# Patient Record
Sex: Male | Born: 1935
Health system: Southern US, Community
[De-identification: ages and names within clinical notes are randomized; demographics above are authoritative.]

## PROBLEM LIST (undated history)

## (undated) DIAGNOSIS — E785 Hyperlipidemia, unspecified: Secondary | ICD-10-CM

## (undated) DIAGNOSIS — Z5189 Encounter for other specified aftercare: Secondary | ICD-10-CM

## (undated) DIAGNOSIS — T7840XA Allergy, unspecified, initial encounter: Secondary | ICD-10-CM

## (undated) DIAGNOSIS — R972 Elevated prostate specific antigen [PSA]: Secondary | ICD-10-CM

## (undated) DIAGNOSIS — E291 Testicular hypofunction: Secondary | ICD-10-CM

## (undated) DIAGNOSIS — I10 Essential (primary) hypertension: Secondary | ICD-10-CM

## (undated) DIAGNOSIS — E237 Disorder of pituitary gland, unspecified: Secondary | ICD-10-CM

## (undated) DIAGNOSIS — D649 Anemia, unspecified: Secondary | ICD-10-CM

## (undated) DIAGNOSIS — M1990 Unspecified osteoarthritis, unspecified site: Secondary | ICD-10-CM

## (undated) HISTORY — DX: Encounter for other specified aftercare: Z51.89

## (undated) HISTORY — DX: Allergy, unspecified, initial encounter: T78.40XA

## (undated) HISTORY — PX: TONSILLECTOMY: SUR1361

## (undated) HISTORY — DX: Disorder of pituitary gland, unspecified: E23.7

## (undated) HISTORY — DX: Essential (primary) hypertension: I10

## (undated) HISTORY — PX: LUMBAR DISC SURGERY: SHX700

## (undated) HISTORY — DX: Anemia, unspecified: D64.9

## (undated) HISTORY — DX: Unspecified osteoarthritis, unspecified site: M19.90

## (undated) HISTORY — DX: Hyperlipidemia, unspecified: E78.5

## (undated) HISTORY — PX: KNEE ARTHROSCOPY: SUR90

## (undated) HISTORY — DX: Testicular hypofunction: E29.1

## (undated) HISTORY — DX: Elevated prostate specific antigen (PSA): R97.20

---

## 2001-12-04 HISTORY — PX: DOPPLER ECHOCARDIOGRAPHY: SHX263

## 2002-11-05 DIAGNOSIS — E237 Disorder of pituitary gland, unspecified: Secondary | ICD-10-CM

## 2002-11-05 HISTORY — DX: Disorder of pituitary gland, unspecified: E23.7

## 2003-06-15 ENCOUNTER — Emergency Department (HOSPITAL_COMMUNITY): Admission: EM | Admit: 2003-06-15 | Discharge: 2003-06-16 | Payer: Self-pay | Admitting: Emergency Medicine

## 2003-06-16 ENCOUNTER — Encounter: Payer: Self-pay | Admitting: Internal Medicine

## 2003-06-16 ENCOUNTER — Inpatient Hospital Stay (HOSPITAL_COMMUNITY): Admission: EM | Admit: 2003-06-16 | Discharge: 2003-06-19 | Payer: Self-pay | Admitting: Emergency Medicine

## 2003-06-17 ENCOUNTER — Encounter: Payer: Self-pay | Admitting: Cardiology

## 2003-06-18 ENCOUNTER — Encounter: Payer: Self-pay | Admitting: Endocrinology

## 2003-07-14 ENCOUNTER — Ambulatory Visit (HOSPITAL_COMMUNITY): Admission: RE | Admit: 2003-07-14 | Discharge: 2003-07-14 | Payer: Self-pay | Admitting: Cardiology

## 2003-11-06 HISTORY — PX: TRANSPHENOIDAL / TRANSNASAL HYPOPHYSECTOMY / RESECTION PITUITARY TUMOR: SUR1382

## 2003-12-24 ENCOUNTER — Ambulatory Visit (HOSPITAL_COMMUNITY): Admission: RE | Admit: 2003-12-24 | Discharge: 2003-12-24 | Payer: Self-pay | Admitting: Neurosurgery

## 2004-05-25 ENCOUNTER — Encounter: Admission: RE | Admit: 2004-05-25 | Discharge: 2004-05-25 | Payer: Self-pay | Admitting: Endocrinology

## 2004-07-03 ENCOUNTER — Encounter: Admission: RE | Admit: 2004-07-03 | Discharge: 2004-07-03 | Payer: Self-pay | Admitting: Neurosurgery

## 2004-07-19 ENCOUNTER — Encounter: Admission: RE | Admit: 2004-07-19 | Discharge: 2004-07-19 | Payer: Self-pay | Admitting: Endocrinology

## 2004-09-05 ENCOUNTER — Ambulatory Visit: Payer: Self-pay | Admitting: Endocrinology

## 2004-09-29 ENCOUNTER — Ambulatory Visit: Payer: Self-pay | Admitting: Endocrinology

## 2004-10-03 ENCOUNTER — Encounter (INDEPENDENT_AMBULATORY_CARE_PROVIDER_SITE_OTHER): Payer: Self-pay | Admitting: Specialist

## 2004-10-03 ENCOUNTER — Inpatient Hospital Stay (HOSPITAL_COMMUNITY): Admission: RE | Admit: 2004-10-03 | Discharge: 2004-10-07 | Payer: Self-pay | Admitting: Neurosurgery

## 2004-11-24 ENCOUNTER — Ambulatory Visit (HOSPITAL_COMMUNITY): Admission: RE | Admit: 2004-11-24 | Discharge: 2004-11-24 | Payer: Self-pay | Admitting: Neurosurgery

## 2004-11-28 ENCOUNTER — Ambulatory Visit: Payer: Self-pay | Admitting: Endocrinology

## 2004-12-20 ENCOUNTER — Encounter: Admission: RE | Admit: 2004-12-20 | Discharge: 2004-12-20 | Payer: Self-pay | Admitting: Neurosurgery

## 2005-01-05 ENCOUNTER — Inpatient Hospital Stay (HOSPITAL_COMMUNITY): Admission: RE | Admit: 2005-01-05 | Discharge: 2005-01-06 | Payer: Self-pay | Admitting: Neurosurgery

## 2005-02-15 ENCOUNTER — Encounter: Admission: RE | Admit: 2005-02-15 | Discharge: 2005-02-15 | Payer: Self-pay | Admitting: Neurosurgery

## 2005-03-07 ENCOUNTER — Ambulatory Visit: Payer: Self-pay | Admitting: Endocrinology

## 2005-03-13 ENCOUNTER — Encounter: Admission: RE | Admit: 2005-03-13 | Discharge: 2005-03-13 | Payer: Self-pay | Admitting: Neurosurgery

## 2005-03-27 ENCOUNTER — Encounter: Admission: RE | Admit: 2005-03-27 | Discharge: 2005-03-27 | Payer: Self-pay | Admitting: Neurosurgery

## 2005-04-13 ENCOUNTER — Ambulatory Visit: Payer: Self-pay | Admitting: Endocrinology

## 2005-04-17 ENCOUNTER — Ambulatory Visit: Payer: Self-pay | Admitting: Endocrinology

## 2005-07-03 ENCOUNTER — Ambulatory Visit: Payer: Self-pay | Admitting: Internal Medicine

## 2005-07-04 ENCOUNTER — Ambulatory Visit: Payer: Self-pay

## 2005-09-10 ENCOUNTER — Ambulatory Visit: Payer: Self-pay | Admitting: Endocrinology

## 2005-09-18 ENCOUNTER — Ambulatory Visit: Payer: Self-pay | Admitting: Endocrinology

## 2005-10-04 ENCOUNTER — Ambulatory Visit: Payer: Self-pay

## 2005-10-11 ENCOUNTER — Ambulatory Visit: Payer: Self-pay | Admitting: Endocrinology

## 2005-11-13 ENCOUNTER — Ambulatory Visit: Payer: Self-pay | Admitting: Endocrinology

## 2005-12-31 ENCOUNTER — Ambulatory Visit: Payer: Self-pay | Admitting: Endocrinology

## 2006-03-19 ENCOUNTER — Ambulatory Visit: Payer: Self-pay | Admitting: Endocrinology

## 2006-03-25 ENCOUNTER — Ambulatory Visit: Payer: Self-pay | Admitting: Endocrinology

## 2006-03-25 HISTORY — PX: ELECTROCARDIOGRAM: SHX264

## 2006-04-29 ENCOUNTER — Encounter: Admission: RE | Admit: 2006-04-29 | Discharge: 2006-04-29 | Payer: Self-pay | Admitting: Neurosurgery

## 2006-08-05 ENCOUNTER — Encounter: Admission: RE | Admit: 2006-08-05 | Discharge: 2006-08-05 | Payer: Self-pay | Admitting: Orthopedic Surgery

## 2006-08-05 ENCOUNTER — Ambulatory Visit: Payer: Self-pay | Admitting: Endocrinology

## 2006-10-23 ENCOUNTER — Ambulatory Visit: Payer: Self-pay | Admitting: Internal Medicine

## 2006-11-11 ENCOUNTER — Ambulatory Visit: Payer: Self-pay | Admitting: Endocrinology

## 2006-12-13 ENCOUNTER — Encounter: Admission: RE | Admit: 2006-12-13 | Discharge: 2006-12-13 | Payer: Self-pay | Admitting: Neurosurgery

## 2006-12-19 ENCOUNTER — Emergency Department (HOSPITAL_COMMUNITY): Admission: EM | Admit: 2006-12-19 | Discharge: 2006-12-19 | Payer: Self-pay | Admitting: Emergency Medicine

## 2006-12-24 ENCOUNTER — Ambulatory Visit: Payer: Self-pay | Admitting: Endocrinology

## 2006-12-24 LAB — CONVERTED CEMR LAB
BUN: 23 mg/dL (ref 6–23)
Basophils Absolute: 0 10*3/uL (ref 0.0–0.1)
CO2: 31 meq/L (ref 19–32)
Creatinine, Ser: 1 mg/dL (ref 0.4–1.5)
GFR calc Af Amer: 95 mL/min
HCT: 33.3 % — ABNORMAL LOW (ref 39.0–52.0)
Hemoglobin: 11.5 g/dL — ABNORMAL LOW (ref 13.0–17.0)
Iron: 45 ug/dL (ref 42–165)
Lymphocytes Relative: 23.5 % (ref 12.0–46.0)
MCHC: 34.4 g/dL (ref 30.0–36.0)
Monocytes Absolute: 0.7 10*3/uL (ref 0.2–0.7)
Monocytes Relative: 13.5 % — ABNORMAL HIGH (ref 3.0–11.0)
Neutro Abs: 3.3 10*3/uL (ref 1.4–7.7)
Neutrophils Relative %: 60.5 % (ref 43.0–77.0)
Potassium: 4 meq/L (ref 3.5–5.1)
RDW: 14.9 % — ABNORMAL HIGH (ref 11.5–14.6)
Sodium: 137 meq/L (ref 135–145)
Transferrin: 186.1 mg/dL — ABNORMAL LOW (ref >=212.0)
Vitamin B-12: 392 pg/mL (ref 211–911)

## 2007-01-07 ENCOUNTER — Ambulatory Visit: Payer: Self-pay | Admitting: Endocrinology

## 2007-01-14 ENCOUNTER — Ambulatory Visit: Payer: Self-pay

## 2007-05-14 ENCOUNTER — Encounter: Payer: Self-pay | Admitting: Endocrinology

## 2007-05-14 DIAGNOSIS — K5730 Diverticulosis of large intestine without perforation or abscess without bleeding: Secondary | ICD-10-CM | POA: Insufficient documentation

## 2007-05-14 DIAGNOSIS — I10 Essential (primary) hypertension: Secondary | ICD-10-CM | POA: Insufficient documentation

## 2007-05-14 DIAGNOSIS — E119 Type 2 diabetes mellitus without complications: Secondary | ICD-10-CM | POA: Insufficient documentation

## 2007-05-14 DIAGNOSIS — E1122 Type 2 diabetes mellitus with diabetic chronic kidney disease: Secondary | ICD-10-CM | POA: Insufficient documentation

## 2007-05-14 DIAGNOSIS — M1990 Unspecified osteoarthritis, unspecified site: Secondary | ICD-10-CM | POA: Insufficient documentation

## 2007-05-14 DIAGNOSIS — D649 Anemia, unspecified: Secondary | ICD-10-CM

## 2007-05-14 DIAGNOSIS — J309 Allergic rhinitis, unspecified: Secondary | ICD-10-CM | POA: Insufficient documentation

## 2007-08-12 ENCOUNTER — Ambulatory Visit: Payer: Self-pay | Admitting: Endocrinology

## 2007-11-19 ENCOUNTER — Encounter: Payer: Self-pay | Admitting: Internal Medicine

## 2007-12-29 ENCOUNTER — Encounter: Payer: Self-pay | Admitting: Endocrinology

## 2008-01-27 ENCOUNTER — Telehealth (INDEPENDENT_AMBULATORY_CARE_PROVIDER_SITE_OTHER): Payer: Self-pay | Admitting: *Deleted

## 2008-02-24 ENCOUNTER — Ambulatory Visit: Payer: Self-pay | Admitting: Endocrinology

## 2008-02-25 ENCOUNTER — Encounter: Payer: Self-pay | Admitting: Endocrinology

## 2008-02-25 LAB — CONVERTED CEMR LAB
ALT: 29 units/L (ref 0–53)
AST: 38 units/L — ABNORMAL HIGH (ref 0–37)
Albumin: 3.5 g/dL (ref 3.5–5.2)
Alkaline Phosphatase: 53 units/L (ref 39–117)
Basophils Absolute: 0 10*3/uL (ref 0.0–0.1)
Calcium, Total (PTH): 9.1 mg/dL (ref 8.4–10.5)
Cholesterol: 109 mg/dL (ref 0–200)
Eosinophils Absolute: 0.2 10*3/uL (ref 0.0–0.7)
Folate: 16.6 ng/mL
Glucose, Bld: 96 mg/dL (ref 70–99)
HDL: 38.2 mg/dL — ABNORMAL LOW (ref >39.0)
Iron: 106 ug/dL (ref 42–165)
LDL Cholesterol: 60 mg/dL (ref 0–99)
Lymphocytes Relative: 28.6 % (ref 12.0–46.0)
MCHC: 32.9 g/dL (ref 30.0–36.0)
MCV: 93.1 fL (ref 78.0–100.0)
Neutrophils Relative %: 59.1 % (ref 43.0–77.0)
Platelets: 253 10*3/uL (ref 150–400)
Potassium: 3.8 meq/L (ref 3.5–5.1)
RDW: 13.5 % (ref 11.5–14.6)
Sodium: 140 meq/L (ref 135–145)
TSH: 3.4 microintl units/mL (ref 0.35–5.50)
VLDL: 11 mg/dL (ref 0–40)
Vitamin B-12: 608 pg/mL (ref 211–911)

## 2008-02-27 ENCOUNTER — Telehealth (INDEPENDENT_AMBULATORY_CARE_PROVIDER_SITE_OTHER): Payer: Self-pay | Admitting: *Deleted

## 2008-03-01 ENCOUNTER — Ambulatory Visit: Payer: Self-pay | Admitting: Endocrinology

## 2008-03-02 LAB — CONVERTED CEMR LAB
BUN: 18 mg/dL (ref 6–23)
CO2: 31 meq/L (ref 19–32)
Chloride: 105 meq/L (ref 96–112)
Eosinophils Relative: 3.6 % (ref 0.0–5.0)
Glucose, Bld: 101 mg/dL — ABNORMAL HIGH (ref 70–99)
HCT: 41.9 % (ref 39.0–52.0)
Hemoglobin, Urine: NEGATIVE
Leukocytes, UA: NEGATIVE
Lymphocytes Relative: 36.5 % (ref 12.0–46.0)
Monocytes Absolute: 0.5 10*3/uL (ref 0.1–1.0)
Monocytes Relative: 7.1 % (ref 3.0–12.0)
Nitrite: NEGATIVE
Platelets: 228 10*3/uL (ref 150–400)
Potassium: 3.8 meq/L (ref 3.5–5.1)
RDW: 13.2 % (ref 11.5–14.6)
Sodium: 141 meq/L (ref 135–145)
Total Protein, Urine: NEGATIVE mg/dL
WBC: 7.5 10*3/uL (ref 4.5–10.5)
pH: 6.5 (ref 5.0–8.0)

## 2008-03-03 ENCOUNTER — Ambulatory Visit: Payer: Self-pay | Admitting: Endocrinology

## 2008-05-06 ENCOUNTER — Ambulatory Visit: Payer: Self-pay | Admitting: Endocrinology

## 2008-05-06 DIAGNOSIS — D352 Benign neoplasm of pituitary gland: Secondary | ICD-10-CM | POA: Insufficient documentation

## 2008-05-06 DIAGNOSIS — R55 Syncope and collapse: Secondary | ICD-10-CM

## 2008-05-06 DIAGNOSIS — E7800 Pure hypercholesterolemia, unspecified: Secondary | ICD-10-CM | POA: Insufficient documentation

## 2008-05-06 DIAGNOSIS — D353 Benign neoplasm of craniopharyngeal duct: Secondary | ICD-10-CM

## 2008-05-08 LAB — CONVERTED CEMR LAB
BUN: 24 mg/dL — ABNORMAL HIGH (ref 6–23)
Calcium: 9.1 mg/dL (ref 8.4–10.5)
Cholesterol: 112 mg/dL (ref 0–200)
Creatinine, Ser: 1.2 mg/dL (ref 0.4–1.5)
GFR calc non Af Amer: 63 mL/min
HDL: 41.5 mg/dL (ref >39.0)
LDL Cholesterol: 62 mg/dL (ref 0–99)
Total CHOL/HDL Ratio: 2.7
Triglycerides: 45 mg/dL (ref 0–149)
VLDL: 9 mg/dL (ref 0–40)

## 2008-05-25 ENCOUNTER — Encounter: Admission: RE | Admit: 2008-05-25 | Discharge: 2008-05-25 | Payer: Self-pay | Admitting: Endocrinology

## 2008-08-17 ENCOUNTER — Telehealth: Payer: Self-pay | Admitting: Endocrinology

## 2008-09-01 ENCOUNTER — Ambulatory Visit: Payer: Self-pay | Admitting: Endocrinology

## 2008-09-01 DIAGNOSIS — M216X9 Other acquired deformities of unspecified foot: Secondary | ICD-10-CM | POA: Insufficient documentation

## 2008-09-01 DIAGNOSIS — R972 Elevated prostate specific antigen [PSA]: Secondary | ICD-10-CM | POA: Insufficient documentation

## 2008-09-01 DIAGNOSIS — G619 Inflammatory polyneuropathy, unspecified: Secondary | ICD-10-CM | POA: Insufficient documentation

## 2008-09-01 DIAGNOSIS — M21371 Foot drop, right foot: Secondary | ICD-10-CM | POA: Insufficient documentation

## 2008-09-01 DIAGNOSIS — G622 Polyneuropathy due to other toxic agents: Secondary | ICD-10-CM

## 2008-09-01 DIAGNOSIS — E291 Testicular hypofunction: Secondary | ICD-10-CM

## 2008-09-01 LAB — CONVERTED CEMR LAB: Hgb A1c MFr Bld: 6.6 % — ABNORMAL HIGH (ref 4.6–6.0)

## 2008-11-15 ENCOUNTER — Telehealth: Payer: Self-pay | Admitting: Endocrinology

## 2008-11-22 ENCOUNTER — Telehealth (INDEPENDENT_AMBULATORY_CARE_PROVIDER_SITE_OTHER): Payer: Self-pay | Admitting: *Deleted

## 2008-11-22 ENCOUNTER — Ambulatory Visit: Payer: Self-pay | Admitting: Internal Medicine

## 2008-11-22 DIAGNOSIS — J019 Acute sinusitis, unspecified: Secondary | ICD-10-CM | POA: Insufficient documentation

## 2008-11-29 ENCOUNTER — Telehealth (INDEPENDENT_AMBULATORY_CARE_PROVIDER_SITE_OTHER): Payer: Self-pay | Admitting: *Deleted

## 2008-11-29 ENCOUNTER — Ambulatory Visit: Payer: Self-pay | Admitting: Endocrinology

## 2008-12-09 ENCOUNTER — Telehealth: Payer: Self-pay | Admitting: Endocrinology

## 2008-12-09 ENCOUNTER — Ambulatory Visit: Payer: Self-pay | Admitting: Endocrinology

## 2008-12-09 LAB — CONVERTED CEMR LAB
BUN: 20 mg/dL (ref 6–23)
Calcium: 9.4 mg/dL (ref 8.4–10.5)
Chloride: 106 meq/L (ref 96–112)
Creatinine, Ser: 1.2 mg/dL (ref 0.4–1.5)
GFR calc non Af Amer: 63 mL/min
Hgb A1c MFr Bld: 6.1 % — ABNORMAL HIGH (ref 4.6–6.0)

## 2008-12-24 ENCOUNTER — Encounter: Admission: RE | Admit: 2008-12-24 | Discharge: 2008-12-24 | Payer: Self-pay | Admitting: Endocrinology

## 2008-12-28 ENCOUNTER — Telehealth (INDEPENDENT_AMBULATORY_CARE_PROVIDER_SITE_OTHER): Payer: Self-pay | Admitting: *Deleted

## 2008-12-28 DIAGNOSIS — M5030 Other cervical disc degeneration, unspecified cervical region: Secondary | ICD-10-CM | POA: Insufficient documentation

## 2009-01-28 ENCOUNTER — Encounter: Payer: Self-pay | Admitting: Endocrinology

## 2009-03-02 ENCOUNTER — Ambulatory Visit: Payer: Self-pay | Admitting: Endocrinology

## 2009-05-11 ENCOUNTER — Encounter: Payer: Self-pay | Admitting: Endocrinology

## 2009-06-08 ENCOUNTER — Telehealth (INDEPENDENT_AMBULATORY_CARE_PROVIDER_SITE_OTHER): Payer: Self-pay | Admitting: *Deleted

## 2009-08-17 ENCOUNTER — Encounter: Payer: Self-pay | Admitting: Endocrinology

## 2009-08-22 ENCOUNTER — Telehealth (INDEPENDENT_AMBULATORY_CARE_PROVIDER_SITE_OTHER): Payer: Self-pay | Admitting: *Deleted

## 2009-08-24 ENCOUNTER — Telehealth: Payer: Self-pay | Admitting: Endocrinology

## 2009-08-31 ENCOUNTER — Encounter: Payer: Self-pay | Admitting: Endocrinology

## 2009-10-17 ENCOUNTER — Ambulatory Visit: Payer: Self-pay | Admitting: Endocrinology

## 2009-10-17 DIAGNOSIS — R42 Dizziness and giddiness: Secondary | ICD-10-CM

## 2009-10-17 DIAGNOSIS — I498 Other specified cardiac arrhythmias: Secondary | ICD-10-CM | POA: Insufficient documentation

## 2009-10-25 ENCOUNTER — Telehealth (INDEPENDENT_AMBULATORY_CARE_PROVIDER_SITE_OTHER): Payer: Self-pay | Admitting: *Deleted

## 2009-11-28 ENCOUNTER — Telehealth: Payer: Self-pay | Admitting: Endocrinology

## 2009-12-07 ENCOUNTER — Encounter: Payer: Self-pay | Admitting: Endocrinology

## 2010-01-02 ENCOUNTER — Telehealth: Payer: Self-pay | Admitting: Endocrinology

## 2010-02-15 ENCOUNTER — Encounter: Payer: Self-pay | Admitting: Endocrinology

## 2010-04-19 ENCOUNTER — Encounter: Payer: Self-pay | Admitting: Endocrinology

## 2010-04-26 ENCOUNTER — Encounter: Admission: RE | Admit: 2010-04-26 | Discharge: 2010-04-26 | Payer: Self-pay | Admitting: Neurosurgery

## 2010-05-31 ENCOUNTER — Encounter: Payer: Self-pay | Admitting: Endocrinology

## 2010-07-04 ENCOUNTER — Encounter: Payer: Self-pay | Admitting: Endocrinology

## 2010-07-05 ENCOUNTER — Telehealth: Payer: Self-pay | Admitting: Endocrinology

## 2010-07-06 ENCOUNTER — Telehealth (INDEPENDENT_AMBULATORY_CARE_PROVIDER_SITE_OTHER): Payer: Self-pay | Admitting: *Deleted

## 2010-07-07 ENCOUNTER — Encounter: Payer: Self-pay | Admitting: Endocrinology

## 2010-08-17 ENCOUNTER — Encounter: Payer: Self-pay | Admitting: Endocrinology

## 2010-08-21 ENCOUNTER — Telehealth: Payer: Self-pay | Admitting: Endocrinology

## 2010-08-23 ENCOUNTER — Ambulatory Visit: Payer: Self-pay | Admitting: Endocrinology

## 2010-08-23 LAB — CONVERTED CEMR LAB
AST: 26 units/L (ref 0–37)
Albumin: 3.5 g/dL (ref 3.5–5.2)
Alkaline Phosphatase: 46 units/L (ref 39–117)
Basophils Absolute: 0.1 10*3/uL (ref 0.0–0.1)
CO2: 30 meq/L (ref 19–32)
Calcium: 9.1 mg/dL (ref 8.4–10.5)
Chloride: 103 meq/L (ref 96–112)
HDL: 40.7 mg/dL (ref >39.00)
LDL Cholesterol: 67 mg/dL (ref 0–99)
Leukocytes, UA: NEGATIVE
Lymphocytes Relative: 36.7 % (ref 12.0–46.0)
Microalb Creat Ratio: 0.5 mg/g (ref 0.0–30.0)
Monocytes Relative: 10.8 % (ref 3.0–12.0)
Nitrite: NEGATIVE
Platelets: 155 10*3/uL (ref 150.0–400.0)
RDW: 14.7 % — ABNORMAL HIGH (ref 11.5–14.6)
Sodium: 139 meq/L (ref 135–145)
Specific Gravity, Urine: 1.015 (ref 1.000–1.030)
Total CHOL/HDL Ratio: 3
Total Protein: 6.4 g/dL (ref 6.0–8.3)
Triglycerides: 61 mg/dL (ref 0.0–149.0)
Urine Glucose: NEGATIVE mg/dL
Urobilinogen, UA: 0.2 (ref 0.0–1.0)
Vitamin B-12: 626 pg/mL (ref 211–911)

## 2010-08-24 ENCOUNTER — Telehealth: Payer: Self-pay | Admitting: Endocrinology

## 2010-09-20 ENCOUNTER — Encounter: Payer: Self-pay | Admitting: Endocrinology

## 2010-09-27 ENCOUNTER — Telehealth (INDEPENDENT_AMBULATORY_CARE_PROVIDER_SITE_OTHER): Payer: Self-pay | Admitting: *Deleted

## 2010-10-11 ENCOUNTER — Encounter: Payer: Self-pay | Admitting: Endocrinology

## 2010-10-13 ENCOUNTER — Encounter: Payer: Self-pay | Admitting: Endocrinology

## 2010-10-17 ENCOUNTER — Encounter: Payer: Self-pay | Admitting: Gastroenterology

## 2010-11-02 ENCOUNTER — Encounter (INDEPENDENT_AMBULATORY_CARE_PROVIDER_SITE_OTHER): Payer: Self-pay | Admitting: *Deleted

## 2010-11-05 HISTORY — PX: COLONOSCOPY: SHX174

## 2010-11-20 ENCOUNTER — Encounter: Payer: Self-pay | Admitting: Endocrinology

## 2010-11-20 ENCOUNTER — Telehealth: Payer: Self-pay | Admitting: Endocrinology

## 2010-11-26 ENCOUNTER — Encounter: Payer: Self-pay | Admitting: Neurosurgery

## 2010-11-26 ENCOUNTER — Encounter: Payer: Self-pay | Admitting: Internal Medicine

## 2010-11-29 ENCOUNTER — Encounter (INDEPENDENT_AMBULATORY_CARE_PROVIDER_SITE_OTHER): Payer: Self-pay | Admitting: *Deleted

## 2010-12-01 ENCOUNTER — Encounter (INDEPENDENT_AMBULATORY_CARE_PROVIDER_SITE_OTHER): Payer: Self-pay | Admitting: *Deleted

## 2010-12-01 ENCOUNTER — Ambulatory Visit
Admission: RE | Admit: 2010-12-01 | Discharge: 2010-12-01 | Payer: Self-pay | Source: Home / Self Care | Attending: Gastroenterology | Admitting: Gastroenterology

## 2010-12-03 LAB — CONVERTED CEMR LAB
ALT: 19 units/L (ref 0–53)
AST: 29 units/L (ref 0–37)
Albumin: 3.7 g/dL (ref 3.5–5.2)
Alkaline Phosphatase: 54 units/L (ref 39–117)
BUN: 22 mg/dL (ref 6–23)
Basophils Relative: 0.1 % (ref 0.0–3.0)
Basophils Relative: 0.5 % (ref 0.0–3.0)
Bilirubin Urine: NEGATIVE
Bilirubin, Direct: 0.2 mg/dL (ref 0.0–0.3)
CO2: 32 meq/L (ref 19–32)
Calcium: 9.2 mg/dL (ref 8.4–10.5)
Calcium: 9.3 mg/dL (ref 8.4–10.5)
Chloride: 105 meq/L (ref 96–112)
Creatinine, Ser: 1.2 mg/dL (ref 0.4–1.5)
Creatinine,U: 143.7 mg/dL
Eosinophils Relative: 2.7 % (ref 0.0–5.0)
Eosinophils Relative: 3.4 % (ref 0.0–5.0)
GFR calc non Af Amer: 76.39 mL/min (ref >60)
Glucose, Bld: 108 mg/dL — ABNORMAL HIGH (ref 70–99)
HCT: 37.7 % — ABNORMAL LOW (ref 39.0–52.0)
HDL: 45.2 mg/dL (ref >39.00)
Hemoglobin: 12.3 g/dL — ABNORMAL LOW (ref 13.0–17.0)
Iron: 73 ug/dL (ref 42–165)
Ketones, ur: NEGATIVE mg/dL
LDL Cholesterol: 68 mg/dL (ref 0–99)
Lymphocytes Relative: 30 % (ref 12.0–46.0)
Lymphs Abs: 2.2 10*3/uL (ref 0.7–4.0)
MCHC: 33.7 g/dL (ref 30.0–36.0)
MCV: 94.5 fL (ref 78.0–100.0)
Microalb Creat Ratio: 1.4 mg/g (ref 0.0–30.0)
Monocytes Absolute: 0.8 10*3/uL (ref 0.1–1.0)
Neutro Abs: 3.2 10*3/uL (ref 1.4–7.7)
Neutrophils Relative %: 56.5 % (ref 43.0–77.0)
Nitrite: NEGATIVE
Platelets: 166 10*3/uL (ref 150.0–400.0)
RBC: 3.88 M/uL — ABNORMAL LOW (ref 4.22–5.81)
Saturation Ratios: 24.8 % (ref 20.0–50.0)
Sodium: 140 meq/L (ref 135–145)
Sodium: 142 meq/L (ref 135–145)
Total CHOL/HDL Ratio: 3
Total Protein, Urine: NEGATIVE mg/dL
Total Protein: 7.1 g/dL (ref 6.0–8.3)
Triglycerides: 56 mg/dL (ref 0.0–149.0)
Urine Glucose: NEGATIVE mg/dL
WBC: 5.7 10*3/uL (ref 4.5–10.5)
WBC: 6.5 10*3/uL (ref 4.5–10.5)
pH: 7 (ref 5.0–8.0)

## 2010-12-05 NOTE — Letter (Signed)
Summary: [facility]   Imported By: Sherian Rein 02/21/2010 09:04:23  _____________________________________________________________________  External Attachment:    Type:   Image     Comment:   External Document

## 2010-12-05 NOTE — Progress Notes (Signed)
  Phone Note Refill Request Message from:  Fax from Pharmacy on January 02, 2010 9:12 AM  Refills Requested: Medication #1:  FLOMAX 0.4 MG  CP24 take 1 by mouth qd Initial call taken by: Josph Macho RMA,  January 02, 2010 9:13 AM    Prescriptions: FLOMAX 0.4 MG  CP24 (TAMSULOSIN HCL) take 1 by mouth qd  #90 x 1   Entered by:   Josph Macho RMA   Authorized by:   Minus Breeding MD   Signed by:   Josph Macho RMA on 01/02/2010   Method used:   Faxed to ...       [facility]       [address] [place], [place]  [address]       Ph: [phone]       Fax: [phone]   RxID:   [id number]

## 2010-12-05 NOTE — Letter (Signed)
Summary: [facility]  [facility]   Imported By: Sherian Rein 05/01/2010 10:56:19  _____________________________________________________________________  External Attachment:    Type:   Image     Comment:   External Document

## 2010-12-05 NOTE — Assessment & Plan Note (Signed)
Summary: FU PER TRIAGE/NWS #   Vital Signs:  Patient profile:   76 year old male Height:      75 inches (190.50 cm) Weight:      230 pounds (104.55 kg) BMI:     28.85 O2 Sat:      97 % on Room air Temp:     97.3 degrees F (36.28 degrees C) oral Pulse rate:   52 / minute BP sitting:   92 / 60  (left arm) Cuff size:   large  Vitals Entered By: Brenton Grills MA (August 23, 2010 11:32 AM)  O2 Flow:  Room air CC: F/U for diabetic shoes/Rx for Robaxin/pt is no longer taking Garlic/aj Is Patient Diabetic? Yes   CC:  F/U for diabetic shoes/Rx for Robaxin/pt is no longer taking Garlic/aj.  History of Present Illness: pt states few mos of slight pain at the right foot, and assoc swelling.    Current Medications (verified): 1)  B-6 Folic Acid 2103958972-50 Mcg-Mcg-Mg  Caps (Cobalamine Combinations) .... Take 1 By Mouth Qd 2)  Actos 45 Mg  Tabs (Pioglitazone Hcl) .... Take 1 By Mouth Qd 3)  Allegra 180 Mg  Tabs (Fexofenadine Hcl) .... Take 1 By Mouth Qd 4)  Flomax 0.4 Mg  Cp24 (Tamsulosin Hcl) .... Take 1 By Mouth Qd 5)  Glucophage Xr 500 Mg  Tb24 (Metformin Hcl) .... Take 2 By Mouth Qd 6)  Mobic 15 Mg  Tabs (Meloxicam) .... Take 1 By Mouth Qd 7)  Robaxin 500 Mg  Tabs (Methocarbamol) .... Take 1 By Mouth Qhs 8)  Saw Palmetto 450 Mg  Caps (Saw Palmetto (Serenoa Repens)) .... Take 1 By Mouth Qd 9)  Garlic 400 Mg  Tbec (Garlic) .... Take 1 By Mouth Qd 10)  Methocarbamol 500 Mg  Tabs (Methocarbamol) .... Take 1 By Mouth Once Daily Prn 11)  Ultram 50 Mg  Tabs (Tramadol Hcl) .... Take 1 By Mouth Q6h As Needed Pain 12)  Fludrocortisone Acetate 0.1 Mg  Tabs (Fludrocortisone Acetate) .... Qd 13)  Vytorin 10-80 Mg  Tabs (Ezetimibe-Simvastatin) .... Qhs 14)  Finasteride 5 Mg Tabs (Finasteride) .Marland Kitchen.. 1 Tab Once Daily  Allergies (verified): No Known Drug Allergies  Past History:  Past Medical History: Last updated: 03/03/2008 Allergic rhinitis Anemia-NOS Diabetes mellitus, type  II Diverticulosis, colon Hypertension Osteoarthritis Hemorrhoids Dyslipidemia (2001) Pituitary Macroadenoma (2004) Elevated PSA, Bx Neg Hypotension/ Syncope, w/u Neg  Review of Systems       The patient complains of weight gain.  The patient denies chest pain and dyspnea on exertion.    Physical Exam  General:  normal appearance.   Pulses:  dorsalis pedis intact bilat.   Extremities:  no deformity.  no ulcer on the feet.  feet are of normal color and temp.   1+ right pedal edema, trace left pedal edema, and mycotic toenails.   there is a healed surgical scar on the right foot.   Neurologic:  sensation is intact to touch on the feet. Additional Exam:  Hemoglobin A1C       [H]  7.3 %       Impression & Recommendations:  Problem # 1:  DIABETES MELLITUS, TYPE II (ICD-250.00) needs increased rx  Problem # 2:  hypotension and bradycardia uncertain etiology  Problem # 3:  foot pain uncertain etiology  Medications Added to Medication List This Visit: 1)  Glucophage Xr 500 Mg Tb24 (Metformin hcl) .... Take 4 by mouth once daily 2)  Diabetic Shoes  .Marland KitchenMarland KitchenMarland Kitchen  1 pair  Other Orders: TLB-TSH (Thyroid Stimulating Hormone) (84443-TSH) TLB-Hepatic/Liver Function Pnl (80076-HEPATIC) TLB-CBC Platelet - w/Differential (85025-CBCD) TLB-BMP (Basic Metabolic Panel-BMET) (80048-METABOL) TLB-Lipid Panel (80061-LIPID) TLB-IBC Pnl (Iron/FE;Transferrin) (83550-IBC) TLB-B12, Serum-Total ONLY (82607-B12) TLB-A1C / Hgb A1C (Glycohemoglobin) (83036-A1C) TLB-Udip w/ Micro (81001-URINE) TLB-Microalbumin/Creat Ratio, Urine (82043-MALB) Cardiology Referral (Cardiology) Est. Patient Level IV ([address])  Patient Instructions: 1)  blood tests are being ordered for you today.  please call [phone] to hear your test results. 2)  here is a prescription for diabetic shoes.   3)  please schedule a "medicare wellness" appointment soon. 4)  (update: i left message on phone-tree:  increase metformin to 4x500 mg  once daily.  refer cardiol). Prescriptions: ROBAXIN 500 MG  TABS (METHOCARBAMOL) take 1 by mouth qhs  #30 x 11   Entered and Authorized by:   Minus Breeding MD   Signed by:   Minus Breeding MD on 08/24/2010   Method used:   Electronically to        [facility]  [address] [facility]* (retail)       [address]       [place], [place]  [address]       Ph: [phone] or [phone]       Fax: [phone]   RxID:   [id number] GLUCOPHAGE XR 500 MG  TB24 (METFORMIN HCL) take 4 by mouth once daily  #120 x 11   Entered and Authorized by:   Minus Breeding MD   Signed by:   Minus Breeding MD on 08/23/2010   Method used:   Electronically to        [facility]  [address] [facility]* (retail)       [address]       [place], [place]  [address]       Ph: [phone] or [phone]       Fax: [phone]   RxID:   [id number] DIABETIC SHOES 1 pair  #1 pr x 0   Entered and Authorized by:   Minus Breeding MD   Signed by:   Minus Breeding MD on 08/23/2010   Method used:   Print then Give to Patient   RxID:   [id number]    Orders Added: 1)  TLB-TSH (Thyroid Stimulating Hormone) [84443-TSH] 2)  TLB-Hepatic/Liver Function Pnl [80076-HEPATIC] 3)  TLB-CBC Platelet - w/Differential [85025-CBCD] 4)  TLB-BMP (Basic Metabolic Panel-BMET) [80048-METABOL] 5)  TLB-Lipid Panel [80061-LIPID] 6)  TLB-IBC Pnl (Iron/FE;Transferrin) [83550-IBC] 7)  TLB-B12, Serum-Total ONLY [82607-B12] 8)  TLB-A1C / Hgb A1C (Glycohemoglobin) [83036-A1C] 9)  TLB-Udip w/ Micro [81001-URINE] 10)  TLB-Microalbumin/Creat Ratio, Urine [82043-MALB] 11)  Cardiology Referral [Cardiology] 12)  Est. Patient Level IV [69485]

## 2010-12-05 NOTE — Letter (Signed)
Summary: [facility]  [facility]   Imported By: Sherian Rein 12/21/2009 10:40:54  _____________________________________________________________________  External Attachment:    Type:   Image     Comment:   External Document

## 2010-12-05 NOTE — Progress Notes (Signed)
Summary: Actos  Phone Note Call from Patient Call back at Home Phone [phone]   Caller: Patient Summary of Call: Pt called stating that he has seen several commercial on TV linking Actos to Bladder Cancer. Pt wants to know if MD think he is okay to continue taking medication. Initial call taken by: Margaret Pyle, CMA,  July 05, 2010 11:23 AM  Follow-up for Phone Call        if i was you, i would continue this medication.   Follow-up by: Minus Breeding MD,  July 05, 2010 1:19 PM  Additional Follow-up for Phone Call Additional follow up Details #1::        Pt informed FYI: Pt is going to have foot surgery at [facility] 09.02.2011 Additional Follow-up by: Margaret Pyle, CMA,  July 05, 2010 1:33 PM

## 2010-12-05 NOTE — Letter (Signed)
Summary: [facility]  [facility]   Imported By: Sherian Rein 09/25/2010 07:34:28  _____________________________________________________________________  External Attachment:    Type:   Image     Comment:   External Document

## 2010-12-05 NOTE — Progress Notes (Signed)
Summary: ALT med  Phone Note Call from Patient Call back at Home Phone [phone]   Caller: Patient Summary of Call: Pt called stating his Insurance will not cover Allegra because it is OTC but the OTC cost it too expensive. Pt is requesting an alternative med that Insurance may cover. Initial call taken by: Margaret Pyle, CMA,  September 27, 2010 4:28 PM  Follow-up for Phone Call        ok to try generic xyzal - done per emr Follow-up by: [name] MD,  September 27, 2010 5:23 PM  Additional Follow-up for Phone Call Additional follow up Details #1::        Pt notified of MD instructions and that med sent. KJ LPN Additional Follow-up by: Kathlene November LPN,  September 29, 2010 8:35 AM    New/Updated Medications: LEVOCETIRIZINE DIHYDROCHLORIDE 5 MG TABS (LEVOCETIRIZINE DIHYDROCHLORIDE) 1po once daily as needed allergies Prescriptions: LEVOCETIRIZINE DIHYDROCHLORIDE 5 MG TABS (LEVOCETIRIZINE DIHYDROCHLORIDE) 1po once daily as needed allergies  #30 x 5   Entered by:   [name] MD   Authorized by:   Minus Breeding MD   Signed by:   [name] MD on 09/27/2010   Method used:   Electronically to        [facility]  [address] [facility]* (retail)       [address]       [place], [place]  [address]       Ph: [phone] or [phone]       Fax: [phone]   RxID:   [id number]

## 2010-12-05 NOTE — Progress Notes (Signed)
Summary: rx refill req  Phone Note Refill Request Message from:  Fax from Pharmacy on August 24, 2010 11:16 AM  Refills Requested: Medication #1:  FLUDROCORTISONE ACETATE 0.1 MG  TABS qd   Dosage confirmed as above?Dosage Confirmed  Method Requested: Fax to Mail Away Pharmacy Initial call taken by: Brenton Grills MA,  August 24, 2010 11:17 AM    Prescriptions: FLUDROCORTISONE ACETATE 0.1 MG  TABS (FLUDROCORTISONE ACETATE) qd  #90 x 3   Entered by:   Brenton Grills MA   Authorized by:   Minus Breeding MD   Signed by:   Brenton Grills MA on 08/24/2010   Method used:   Faxed to ...       [facility] ([facility]order[facility]       [address] [place], [place]  [address]       Ph: [phone]       Fax: [phone]   RxID:   [id number]

## 2010-12-05 NOTE — Progress Notes (Signed)
Summary: Rx req  Phone Note Call from Patient Call back at Home Phone [phone]   Caller: Patient Summary of Call: Pt called requesting Rx for DM shoes. Initial call taken by: Margaret Pyle, CMA,  August 21, 2010 10:49 AM  Follow-up for Phone Call        ov is due.  we'll address then. Follow-up by: Minus Breeding MD,  August 21, 2010 12:23 PM  Additional Follow-up for Phone Call Additional follow up Details #1::        Pt advised to make OV via home VM. Additional Follow-up by: Margaret Pyle, CMA,  August 21, 2010 1:12 PM

## 2010-12-05 NOTE — Letter (Signed)
Summary: [facility]  [facility]   Imported By: Lennie Odor 06/08/2010 10:34:48  _____________________________________________________________________  External Attachment:    Type:   Image     Comment:   External Document

## 2010-12-05 NOTE — Progress Notes (Signed)
  Phone Note Refill Request Message from:  Fax from Pharmacy on January 02, 2010 9:38 AM  Refills Requested: Medication #1:  VYTORIN 10-80 MG  TABS qhs   Dosage confirmed as above?Dosage Confirmed  Medication #2:  ACTOS 45 MG  TABS take 1 by mouth qd   Dosage confirmed as above?Dosage Confirmed  Medication #3:  ALLEGRA 180 MG  TABS take 1 by mouth qd   Dosage confirmed as above?Dosage Confirmed  Medication #4:  GLUCOPHAGE XR 500 MG  TB24 take 2 by mouth qd   Dosage confirmed as above?Dosage Confirmed Initial call taken by: Josph Macho RMA,  January 02, 2010 9:39 AM    Prescriptions: VYTORIN 10-80 MG  TABS (EZETIMIBE-SIMVASTATIN) qhs  #90 x 2   Entered by:   Josph Macho RMA   Authorized by:   Minus Breeding MD   Signed by:   Josph Macho RMA on 01/02/2010   Method used:   Faxed to ...       [facility] (mail-order)       [address] [place], [place]  [address]       Ph: [phone]       Fax: [phone]   RxID:   [id number] ALLEGRA 180 MG  TABS (FEXOFENADINE HCL) take 1 by mouth qd  #90 x 2   Entered by:   Josph Macho RMA   Authorized by:   Minus Breeding MD   Signed by:   Josph Macho RMA on 01/02/2010   Method used:   Faxed to ...       [facility] (mail[facility]order)       [address] [place], [place]  [address]       Ph: [phone]       Fax: [phone]   RxID:   [id number] GLUCOPHAGE XR 500 MG  TB24 (METFORMIN HCL) take 2 by mouth qd  #180 x 2   Entered by:   Josph Macho RMA   Authorized by:   Minus Breeding MD   Signed by:   Josph Macho RMA on 01/02/2010   Method used:   Faxed to ...       [facility] (mail[facility]order)       [address] [place], [place]  [address]       Ph: [phone]       Fax: [phone]   RxID:   [id number] ACTOS 45 MG  TABS (PIOGLITAZONE HCL) take 1 by mouth qd  #90 x 2   Entered by:   Josph Macho RMA   Authorized by:   Minus Breeding MD   Signed by:   Josph Macho RMA on  01/02/2010   Method used:   Faxed to ...       [facility] (mail[facility]order)       [address] [place], [place]  [address]       Ph: [phone]       Fax: [phone]   RxID:   [id number]

## 2010-12-05 NOTE — Progress Notes (Signed)
  Phone Note Refill Request Message from:  Fax from Pharmacy on November 28, 2009 10:24 AM  Refills Requested: Medication #1:  FLUDROCORTISONE ACETATE 0.1 MG  TABS qd   Dosage confirmed as above?Dosage Confirmed Initial call taken by: Josph Macho CMA,  November 28, 2009 10:24 AM    Prescriptions: FLUDROCORTISONE ACETATE 0.1 MG  TABS (FLUDROCORTISONE ACETATE) qd  #90 x 2   Entered by:   Josph Macho CMA   Authorized by:   Minus Breeding MD   Signed by:   Josph Macho CMA on 11/28/2009   Method used:   Faxed to ...       [facility] (mail-order)       [address] [place], [place]  [address]       Ph: [phone]       Fax: [phone]   RxID:   [id number]

## 2010-12-05 NOTE — Letter (Signed)
Summary: [facility]  [facility]   Imported By: Lester Pittsburgh 07/17/2010 09:05:46  _____________________________________________________________________  External Attachment:    Type:   Image     Comment:   External Document

## 2010-12-05 NOTE — Letter (Signed)
Summary: [facility]  [facility]   Imported By: Sherian Rein 07/11/2010 13:24:25  _____________________________________________________________________  External Attachment:    Type:   Image     Comment:   External Document

## 2010-12-05 NOTE — Progress Notes (Signed)
   Records faxed to [facility] w/ [facility] [phone] [facility]  July 06, 2010 10:00 AM

## 2010-12-05 NOTE — Letter (Signed)
Summary: [facility]  [facility]   Imported By: Sherian Rein 08/26/2010 10:35:38  _____________________________________________________________________  External Attachment:    Type:   Image     Comment:   External Document

## 2010-12-07 NOTE — Letter (Signed)
Summary: [facility] Instructions  [facility]  [address] [place], [place] [address]   Phone: [phone]  Fax: [phone]       Joseph Rocha    01-May-1935    MRN: [id number]        Procedure Day /Date: Friday 12/15/10     Arrival Time: 8:30 am      Procedure Time: 9:30 am     Location of Procedure:                    _X_  [facility] (4th Floor)      PREPARATION FOR COLONOSCOPY WITH MOVIPREP   Starting 5 days prior to your procedure _Sunday 2/5/12_ do not eat nuts, seeds, popcorn, corn, beans, peas,  salads, or any raw vegetables.  Do not take any fiber supplements (e.g. Metamucil, Citrucel, and Benefiber).  THE DAY BEFORE YOUR PROCEDURE         DATE: 12/14/10  DAY: Thursday    1.  Drink clear liquids the entire day-NO SOLID FOOD  2.  Do not drink anything colored red or purple.  Avoid juices with pulp.  No orange juice.  3.  Drink at least 64 oz. (8 glasses) of fluid/clear liquids during the day to prevent dehydration and help the prep work efficiently.  CLEAR LIQUIDS INCLUDE: Water Jello Ice Popsicles Tea (sugar ok, no milk/cream) Powdered fruit flavored drinks Coffee (sugar ok, no milk/cream) Gatorade Juice: apple, white grape, white cranberry  Lemonade Clear bullion, consomm, broth Carbonated beverages (any kind) Strained chicken noodle soup Hard Candy                             4.  In the morning, mix first dose of MoviPrep solution:    Empty 1 Pouch A and 1 Pouch B into the disposable container    Add lukewarm drinking water to the top line of the container. Mix to dissolve    Refrigerate (mixed solution should be used within 24 hrs)  5.  Begin drinking the prep at 5:00 p.m. The MoviPrep container is divided by 4 marks.   Every 15 minutes drink the solution down to the next mark (approximately 8 oz) until the full liter is complete.   6.  Follow completed prep with 16 oz of clear liquid of your choice (Nothing red or  purple).  Continue to drink clear liquids until bedtime.  7.  Before going to bed, mix second dose of MoviPrep solution:    Empty 1 Pouch A and 1 Pouch B into the disposable container    Add lukewarm drinking water to the top line of the container. Mix to dissolve    Refrigerate  THE DAY OF YOUR PROCEDURE      DATE: 12/15/10   DAY: Friday   Beginning at 4:30 a.m. (5 hours before procedure):         1. Every 15 minutes, drink the solution down to the next mark (approx 8 oz) until the full liter is complete.  2. Follow completed prep with 16 oz. of clear liquid of your choice.    3. You may drink clear liquids until 7:30 am  (2 HOURS BEFORE PROCEDURE).   MEDICATION INSTRUCTIONS  Unless otherwise instructed, you should take regular prescription medications with a small sip of water   as early as possible the morning of your procedure.  Diabetic patients - see separate instructions.  Additional medication instructions:  Hold Iron pill for 7 days.         OTHER INSTRUCTIONS  You will need a responsible adult at least 74 years of age to accompany you and drive you home.   This person must remain in the waiting room during your procedure.  Wear loose fitting clothing that is easily removed.  Leave jewelry and other valuables at home.  However, you may wish to bring a book to read or  an iPod/MP3 player to listen to music as you wait for your procedure to start.  Remove all body piercing jewelry and leave at home.  Total time from sign-in until discharge is approximately 2-3 hours.  You should go home directly after your procedure and rest.  You can resume normal activities the  day after your procedure.  The day of your procedure you should not:   Drive   Make legal decisions   Operate machinery   Drink alcohol   Return to work  You will receive specific instructions about eating, activities and medications before you leave.    The above instructions  have been reviewed and explained to me by   Sherren Kerns RN  December 01, 2010 10:48 AM     I fully understand and can verbalize these instructions _____________________________ Date _________

## 2010-12-07 NOTE — Progress Notes (Signed)
Summary: vytorin  Phone Note Refill Request Message from:  Fax from Pharmacy on November 20, 2010 1:24 PM  Refills Requested: Medication #1:  VYTORIN 10-80 MG  TABS qhs [facility]   Method Requested: Fax to [facility] Initial call taken by: Orlan Leavens RMA,  November 20, 2010 1:25 PM    Prescriptions: VYTORIN 10-80 MG  TABS (EZETIMIBE-SIMVASTATIN) qhs  #90 x 2   Entered by:   Orlan Leavens RMA   Authorized by:   Minus Breeding MD   Signed by:   Orlan Leavens RMA on 11/20/2010   Method used:   Faxed to ...       [facility] (mail-order)       [address] [place], [place]  [address]       Ph: [phone]       Fax: [phone]   RxID:   [id number]

## 2010-12-07 NOTE — Letter (Signed)
Summary: Verdis Prime MD/[facility]  Verdis Prime MD/[facility]   Imported By: Lester Kirtland 11/21/2010 10:01:24  _____________________________________________________________________  External Attachment:    Type:   Image     Comment:   External Document

## 2010-12-07 NOTE — Consult Note (Signed)
Summary: [facility]   Imported By: Sherian Rein 10/17/2010 10:45:28  _____________________________________________________________________  External Attachment:    Type:   Image     Comment:   External Document

## 2010-12-07 NOTE — Letter (Signed)
Summary: Diabetic Instructions  [facility]  [address] [place], [place] [address]   Phone: [phone]  Fax: [phone]    Joseph Rocha May 23, 1935 MRN: [id number]   _ x _   ORAL DIABETIC MEDICATION INSTRUCTIONS(glucophage & actos)  The day before your procedure:   Take your diabetic pill as you do normally  The day of your procedure:   Do not take your diabetic pill    We will check your blood sugar levels during the admission process and again in Recovery before discharging you home  ________________________________________________________________________

## 2010-12-07 NOTE — Letter (Signed)
Summary: Colonoscopy Letter  [facility]  [address]   [place], [place] [address]   Phone: [phone]  Fax: [phone]      October 17, 2010 MRN: [id number]   Joseph Rocha [address] [place], [place]  [address]   Dear [name],   According to your medical record, it is time for you to schedule a Colonoscopy. The American Cancer Society recommends this procedure as a method to detect early colon cancer. Patients with a family history of colon cancer, or a personal history of colon polyps or inflammatory bowel disease are at increased risk.  This letter has been generated based on the recommendations made at the time of your procedure. If you feel that in your particular situation this may no longer apply, please contact our office.  Please call our office at [phone] to schedule this appointment or to update your records at your earliest convenience.  Thank you for cooperating with [place] to provide you with the very best care possible.   Sincerely,  Claudette Head, M.D.  [facility] [phone]

## 2010-12-07 NOTE — Miscellaneous (Signed)
Summary: previsit prep/rm  Clinical Lists Changes  Medications: Added new medication of MOVIPREP 100 GM  SOLR (PEG-KCL-NACL-NASULF-NA ASC-C) As per prep instructions. - Signed Rx of MOVIPREP 100 GM  SOLR (PEG-KCL-NACL-NASULF-NA ASC-C) As per prep instructions.;  #1 x 0;  Signed;  Entered by: Sherren Kerns RN;  Authorized by: Meryl Dare MD [facility];  Method used: Electronically to [facility]. # [id number]*, [address] [place], [place], [place]  [address], Ph: [phone] or [phone], Fax: [phone] Observations: Added new observation of ALLERGY REV: Done (12/01/2010 10:26) Added new observation of NKA: T (12/01/2010 10:26)    Prescriptions: MOVIPREP 100 GM  SOLR (PEG-KCL-NACL-NASULF-NA ASC-C) As per prep instructions.  #1 x 0   Entered by:   Sherren Kerns RN   Authorized by:   Meryl Dare MD [facility]   Signed by:   Sherren Kerns RN on 12/01/2010   Method used:   Electronically to        [facility] (retail)       [address]       [place], [place]  [address]       Ph: [phone] or [phone]       Fax: [phone]   RxID:   [id number]

## 2010-12-07 NOTE — Letter (Signed)
Summary: [facility]   Imported By: Sherian Rein 11/29/2010 11:13:00  _____________________________________________________________________  External Attachment:    Type:   Image     Comment:   External Document

## 2010-12-07 NOTE — Letter (Signed)
Summary: Pre Visit Letter Revised  [facility]  [address] [place], [place] [address]   Phone: [phone]  Fax: [phone]        11/02/2010 MRN: [id number] Joseph Rocha [address] [place], [place]  [address]             Procedure Date:  12-15-10   Welcome to the Gastroenterology Division at [facility].    You are scheduled to see a nurse for your pre-procedure visit on 12-01-10 at 10:30a.m. on the 3rd floor at [facility], [address]  We ask that you try to arrive at our office 15 minutes prior to your appointment time to allow for check-in.  Please take a minute to review the attached form.  If you answer "Yes" to one or more of the questions on the first page, we ask that you call the person listed at your earliest opportunity.  If you answer "No" to all of the questions, please complete the rest of the form and bring it to your appointment.    Your nurse visit will consist of discussing your medical and surgical history, your immediate family medical history, and your medications.   If you are unable to list all of your medications on the form, please bring the medication bottles to your appointment and we will list them.  We will need to be aware of both prescribed and over the counter drugs.  We will need to know exact dosage information as well.    Please be prepared to read and sign documents such as consent forms, a financial agreement, and acknowledgement forms.  If necessary, and with your consent, a friend or relative is welcome to sit-in on the nurse visit with you.  Please bring your insurance card so that we may make a copy of it.  If your insurance requires a referral to see a specialist, please bring your referral form from your primary care physician.  No co-pay is required for this nurse visit.     If you cannot keep your appointment, please call [phone] to cancel or reschedule prior to your appointment date.  This  allows [place] the opportunity to schedule an appointment for another patient in need of care.    Thank you for choosing [facility] for your medical needs.  We appreciate the opportunity to care for you.  Please visit [place] at our website  to learn more about our practice.  Sincerely, The Gastroenterology Division

## 2010-12-13 ENCOUNTER — Encounter: Payer: Self-pay | Admitting: Endocrinology

## 2010-12-15 ENCOUNTER — Other Ambulatory Visit: Payer: Self-pay | Admitting: Gastroenterology

## 2010-12-15 ENCOUNTER — Other Ambulatory Visit (AMBULATORY_SURGERY_CENTER): Payer: Medicare Other | Admitting: Gastroenterology

## 2010-12-15 DIAGNOSIS — Z1211 Encounter for screening for malignant neoplasm of colon: Secondary | ICD-10-CM

## 2010-12-15 DIAGNOSIS — K5730 Diverticulosis of large intestine without perforation or abscess without bleeding: Secondary | ICD-10-CM

## 2010-12-15 DIAGNOSIS — K648 Other hemorrhoids: Secondary | ICD-10-CM

## 2010-12-15 LAB — HM COLONOSCOPY

## 2010-12-21 NOTE — Procedures (Signed)
Summary: Colonosocpy   Colonoscopy  Procedure date:  12/15/2010  Findings:      Location:  [facility].    Procedures Next Due Date:    Colonoscopy: 12/2020 COLONOSCOPY PROCEDURE REPORT PATIENT:  Joseph Rocha, Joseph Rocha  MR#:  [id number] BIRTHDATE:   25-Dec-1934, 75 yrs. old   GENDER:   male ENDOSCOPIST:   Judie Petit T. Russella Dar, MD, [facility]   PROCEDURE DATE:  12/15/2010 PROCEDURE:  Average-risk screening colonoscopy P2951 ASA CLASS:   Class II INDICATIONS: 1) Routine Risk Screening  MEDICATIONS:    Fentanyl 75 mcg IV, Versed 8 mg IV DESCRIPTION OF PROCEDURE:   After the risks benefits and alternatives of the procedure were thoroughly explained, informed consent was obtained.  Digital rectal exam was performed and revealed no abnormalities.   The LB [id number]-[id number] endoscope was introduced through the anus and advanced to the cecum, which was identified by both the appendix and ileocecal valve, without limitations.  The quality of the prep was good, using MoviPrep.  The instrument was then slowly withdrawn as the colon was fully examined. <<PROCEDUREIMAGES>>          <<OLD IMAGES>> FINDINGS:  Mild diverticulosis was found in the sigmoid colon. A normal appearing cecum, ileocecal valve, and appendiceal orifice were identified. The ascending, hepatic flexure, transverse, splenic flexure, descending colon, and rectum appeared unremarkable. Retroflexed views in the rectum revealed internal hemorrhoids, small. The time to cecum =  3  minutes. The scope was then withdrawn (time =  10  min) from the patient and the procedure completed.  COMPLICATIONS:   None  ENDOSCOPIC IMPRESSION:  1) Mild diverticulosis in the sigmoid colon  2) Internal hemorrhoids  RECOMMENDATIONS:  1) High fiber diet with liberal fluid intake.  2) Continue current colorectal screening for "routine risk" patients with a repeat colonoscopy in 10 years.    Venita Lick. Russella Dar, MD, [facility]   CC: Minus Breeding,  MD

## 2010-12-27 ENCOUNTER — Telehealth: Payer: Self-pay | Admitting: Endocrinology

## 2010-12-27 NOTE — Consult Note (Signed)
[facility]   Imported By: Lennie Odor 12/21/2010 09:42:26  _____________________________________________________________________  External Attachment:    Type:   Image     Comment:   External Document

## 2011-01-02 NOTE — Progress Notes (Signed)
Summary: actos  Phone Note Refill Request Message from:  Fax from Pharmacy on December 27, 2010 10:35 AM  Refills Requested: Medication #1:  ACTOS 45 MG  TABS take 1 by mouth qd   Dosage confirmed as above?Dosage Confirmed   Last Refilled: 01/02/2010 [facility]-90 day supply   Method Requested: Fax to Mail Away Pharmacy Next Appointment Scheduled: none Initial call taken by: Brenton Grills CMA Duncan Dull),  December 27, 2010 10:36 AM    Prescriptions: ACTOS 45 MG  TABS (PIOGLITAZONE HCL) take 1 by mouth qd  #90 x 0   Entered by:   Brenton Grills CMA (AAMA)   Authorized by:   Minus Breeding MD   Signed by:   Brenton Grills CMA (AAMA) on 12/27/2010   Method used:   Faxed to ...       [facility]order[facility]       [address] [place], [place]  [address]       Ph: [phone]       Fax: [phone]   RxID:   [id number]

## 2011-02-06 ENCOUNTER — Other Ambulatory Visit: Payer: Self-pay | Admitting: Endocrinology

## 2011-02-06 DIAGNOSIS — E119 Type 2 diabetes mellitus without complications: Secondary | ICD-10-CM

## 2011-02-06 MED ORDER — METFORMIN HCL ER 500 MG PO TB24: ORAL_TABLET | ORAL | Status: DC

## 2011-02-06 NOTE — Telephone Encounter (Signed)
R'cd fax from [facility] for refill of pt's Metformin.  Last OV-08/23/2010 Last Filled-08/23/2010

## 2011-02-06 NOTE — Telephone Encounter (Signed)
Rx faxed to [facility].

## 2011-02-06 NOTE — Telephone Encounter (Signed)
Addended by: Brenton Grills on: 02/06/2011 10:02 AM   Modules accepted: Orders

## 2011-02-06 NOTE — Telephone Encounter (Signed)
Rx printed to be signed and faxed to [facility] (rx did not go through electronically).

## 2011-02-23 ENCOUNTER — Other Ambulatory Visit (INDEPENDENT_AMBULATORY_CARE_PROVIDER_SITE_OTHER): Payer: Medicare Other

## 2011-02-23 ENCOUNTER — Encounter: Payer: Self-pay | Admitting: Internal Medicine

## 2011-02-23 ENCOUNTER — Ambulatory Visit (INDEPENDENT_AMBULATORY_CARE_PROVIDER_SITE_OTHER): Payer: Medicare Other | Admitting: Internal Medicine

## 2011-02-23 DIAGNOSIS — E7800 Pure hypercholesterolemia, unspecified: Secondary | ICD-10-CM

## 2011-02-23 DIAGNOSIS — E119 Type 2 diabetes mellitus without complications: Secondary | ICD-10-CM

## 2011-02-23 DIAGNOSIS — I10 Essential (primary) hypertension: Secondary | ICD-10-CM

## 2011-02-23 DIAGNOSIS — E237 Disorder of pituitary gland, unspecified: Secondary | ICD-10-CM | POA: Insufficient documentation

## 2011-02-23 DIAGNOSIS — D352 Benign neoplasm of pituitary gland: Secondary | ICD-10-CM

## 2011-02-23 DIAGNOSIS — L049 Acute lymphadenitis, unspecified: Secondary | ICD-10-CM | POA: Insufficient documentation

## 2011-02-23 LAB — CBC WITH DIFFERENTIAL/PLATELET
Basophils Absolute: 0 10*3/uL (ref 0.0–0.1)
HCT: 36.7 % — ABNORMAL LOW (ref 39.0–52.0)
Lymphs Abs: 2.2 10*3/uL (ref 0.7–4.0)
MCHC: 33.9 g/dL (ref 30.0–36.0)
MCV: 92.9 fl (ref 78.0–100.0)
Monocytes Absolute: 0.8 10*3/uL (ref 0.1–1.0)
Neutro Abs: 3.1 10*3/uL (ref 1.4–7.7)
Platelets: 176 10*3/uL (ref 150.0–400.0)
RDW: 14.5 % (ref 11.5–14.6)

## 2011-02-23 LAB — TSH: TSH: 3.22 u[IU]/mL (ref 0.35–5.50)

## 2011-02-23 LAB — LIPID PANEL
Cholesterol: 106 mg/dL (ref 0–200)
HDL: 39.4 mg/dL (ref >39.00)
Triglycerides: 71 mg/dL (ref 0.0–149.0)

## 2011-02-23 LAB — COMPREHENSIVE METABOLIC PANEL
ALT: 20 U/L (ref 0–53)
Alkaline Phosphatase: 42 U/L (ref 39–117)
Creatinine, Ser: 1.3 mg/dL (ref 0.4–1.5)
Sodium: 139 mEq/L (ref 135–145)
Total Bilirubin: 0.4 mg/dL (ref 0.3–1.2)
Total Protein: 6.1 g/dL (ref 6.0–8.3)

## 2011-02-23 LAB — HEMOGLOBIN A1C: Hgb A1c MFr Bld: 6.3 % (ref 4.6–6.5)

## 2011-02-23 MED ORDER — EZETIMIBE-SIMVASTATIN 10-80 MG PO TABS: 1.0000 | ORAL_TABLET | Freq: Every day | ORAL | Status: DC

## 2011-02-23 MED ORDER — PIOGLITAZONE HCL 45 MG PO TABS: 45.0000 mg | ORAL_TABLET | Freq: Every day | ORAL | Status: DC

## 2011-02-23 MED ORDER — FLUDROCORTISONE ACETATE 0.1 MG PO TABS: 0.1000 mg | ORAL_TABLET | Freq: Every day | ORAL | Status: DC

## 2011-02-23 MED ORDER — CEFUROXIME AXETIL 500 MG PO TABS: 500.0000 mg | ORAL_TABLET | Freq: Two times a day (BID) | ORAL | Status: AC

## 2011-02-23 NOTE — Assessment & Plan Note (Signed)
Check his lytes today

## 2011-02-23 NOTE — Assessment & Plan Note (Addendum)
Start ceftin for bacterial infection, will check a CBC as well

## 2011-02-23 NOTE — Patient Instructions (Signed)
Diabetes, Type 2 Diabetes is a lasting (chronic) disease. In type 2 diabetes, the pancreas does not make enough insulin (a hormone), and the body does not respond normally to the insulin that is made. This type of diabetes was also previously called adult onset diabetes. About 90% of all those who have diabetes have type 2. It usually occurs after the age of 34 but can occur at any age. CAUSES Unlike type 1 diabetes, which happens because insulin is no longer being made, type 2 diabetes happens because the body is making less insulin and has trouble using the insulin properly. SYMPTOMS  Drinking more than usual.   Urinating more than usual.   Blurred vision.   Dry, itchy skin.   Frequent infection like yeast infections in women.   More tired than usual (fatigue).  TREATMENT  Healthy eating.   Exercise.   Medication, if needed.   Monitoring blood glucose (sugar).   Seeing your caregiver regularly.  HOME CARE INSTRUCTIONS  Check your blood glucose (sugar) at least once daily. More frequent monitoring may be necessary, depending on your medications and on how well your diabetes is controlled. Your caregiver will advise you.   Take your medicine as directed by your caregiver.   Do not smoke.   Make wise food choices. Ask your caregiver for information. Weight loss can improve your diabetes.   Learn about low blood glucose (hypoglycemia) and how to treat it.   Get your eyes checked regularly.   Have a yearly physical exam. Have your blood pressure checked. Get your blood and urine tested.   Wear a pendant or bracelet saying that you have diabetes.   Check your feet every night for sores. Let your caregiver know if you have sores that are not healing.  SEEK MEDICAL CARE IF:  You are having problems keeping your blood glucose at target range.   You feel you might be having problems with your medicines.   You have symptoms of an illness that is not improving after 24  hours.   You have a sore or wound that is not healing.   You notice a change in vision or a new problem with your vision.  You develop a fever of more than 100.5Lymph Nodes, Swollen Glands The lymphatic system filters fluid from around cells. It is like a system of blood vessels. These channels carry lymph instead of blood. The lymphatic system is an important part of the immune (disease fighting) system. When people talk about "swollen glands in the neck," they are usually talking about swollen lymph nodes. The lymph nodes are like the little traps for infection. You and your caregiver may be able to feel lymph nodes, especially swollen nodes, in these common areas: the groin (inguinal area), armpits (axilla), and above the clavicle (supraclavicular). You may also feel them in the neck (cervical) and the back of the head just above the hairline (occipital). Swollen glands occur when there is any condition in which the body responds with an allergic type of reaction. For instance, the glands in the neck can become swollen from insect bites or any type of minor infection on the head. These are very noticeable in children with only minor problems. Lymph nodes may also become swollen when there is a tumor or problem with the lymphatic system, such as Hodgkin's disease. TREATMENT Most swollen glands do not require treatment. They can be observed (watched) for a short period of time, if your caregiver feels it is necessary. Most  of the time, observation is not necessary.  Antibiotics (medicines that kill germs) may be prescribed by your caregiver. Your caregiver may prescribe these if he or she feels the swollen glands are due to a bacterial (germ) infection. Antibiotics are not used if the swollen glands are caused by a virus.  HOME CARE INSTRUCTIONS Take medications as directed by your caregiver. Only take over-the-counter or prescription medicines for pain, discomfort, or fever as directed by your  caregiver.  SEEK MEDICAL CARE IF: If you begin to run a temperature greater than 100.5, or as your caregiver suggests.  MAKE SURE YOU:  Understand these instructions.  Will watch your condition.  Will get help right away if you are not doing well or get worse.  Document Released: 10/12/2002 Document Re-Released: 10/04/2008  [facility] Patient Information 2011 [place], [place]..  Document Released: 10/22/2005 Document Re-Released: 11/13/2009 [facility] Patient Information 2011 [place], [place].

## 2011-02-23 NOTE — Assessment & Plan Note (Signed)
Check his A1C level today and monitor his renal function

## 2011-02-23 NOTE — Progress Notes (Signed)
Subjective:    Patient ID: Joseph Rocha, male    DOB: 05-26-1935, 76 y.o.   MRN: [id number]  URI  This is a new problem. The current episode started yesterday. The problem has been unchanged. There has been no fever. Associated symptoms include ear pain (left ear), rhinorrhea, sinus pain and swollen glands. Pertinent negatives include no abdominal pain, chest pain, congestion, coughing, diarrhea, dysuria, headaches, joint pain, joint swelling, nausea, neck pain, plugged ear sensation, rash, sneezing, sore throat, vomiting or wheezing. He has tried nothing for the symptoms.  Diabetes He presents for his follow-up diabetic visit. He has type 2 diabetes mellitus. No MedicAlert identification noted. His disease course has been fluctuating. Pertinent negatives for hypoglycemia include no confusion, dizziness, headaches, hunger, mood changes, nervousness/anxiousness, pallor, seizures, sleepiness, speech difficulty, sweats or tremors. Pertinent negatives for diabetes include no blurred vision, no chest pain, no fatigue, no foot paresthesias, no foot ulcerations, no polydipsia, no polyphagia, no polyuria, no visual change, no weakness and no weight loss. There are no hypoglycemic complications. Symptoms are improving. There are no diabetic complications. He is compliant with treatment all of the time. His weight is stable. He is following a generally healthy diet. Meal planning includes avoidance of concentrated sweets. He has not had a previous visit with a dietician. He never participates in exercise. There is no change in his home blood glucose trend. His breakfast blood glucose range is generally 110-130 mg/dl. His dinner blood glucose range is generally 110-130 mg/dl. His overall blood glucose range is 110-130 mg/dl. An ACE inhibitor/angiotensin II receptor blocker is not being taken. He does not see a podiatrist.Eye exam is not current.      Review of Systems  Constitutional: Negative for fever,  chills, weight loss, diaphoresis, activity change, appetite change, fatigue and unexpected weight change.  HENT: Positive for ear pain (left ear) and rhinorrhea. Negative for hearing loss, nosebleeds, congestion, sore throat, facial swelling, sneezing, drooling, mouth sores, trouble swallowing, neck pain, neck stiffness, dental problem, voice change, postnasal drip, sinus pressure, tinnitus and ear discharge.   Eyes: Negative for blurred vision, photophobia, pain, discharge, redness, itching and visual disturbance.  Respiratory: Negative for apnea, cough, choking, chest tightness, shortness of breath, wheezing and stridor.   Cardiovascular: Negative for chest pain, palpitations and leg swelling.  Gastrointestinal: Negative for nausea, vomiting, abdominal pain, diarrhea and blood in stool.  Genitourinary: Negative for dysuria and polyuria.  Musculoskeletal: Negative for myalgias, back pain, joint pain, joint swelling, arthralgias and gait problem.  Skin: Negative for color change, pallor and rash.  Neurological: Negative for dizziness, tremors, seizures, speech difficulty, weakness and headaches.  Hematological: Positive for adenopathy. Negative for polydipsia and polyphagia. Does not bruise/bleed easily.  Psychiatric/Behavioral: Negative for confusion. The patient is not nervous/anxious.    Lab Results  Component Value Date   WBC 6.4 08/23/2010   HGB 12.4* 08/23/2010   HCT 36.1* 08/23/2010   PLT 155.0 08/23/2010   CHOL 120 08/23/2010   TRIG 61.0 08/23/2010   HDL 40.70 08/23/2010   ALT 15 08/23/2010   AST 26 08/23/2010   NA 139 08/23/2010   K 3.7 08/23/2010   CL 103 08/23/2010   CREATININE 1.4 08/23/2010   BUN 27* 08/23/2010   CO2 30 08/23/2010   TSH 3.85 08/23/2010   PSA 0.67 03/02/2009   HGBA1C 7.3* 08/23/2010   MICROALBUR 0.7 08/23/2010      Objective:   Physical Exam  Constitutional: He is oriented to person, place, and time. He  appears well-developed and well-nourished. No  distress.  HENT:  Head: Normocephalic. No trismus in the jaw.  Right Ear: Hearing, tympanic membrane, external ear and ear canal normal. No drainage, swelling or tenderness. No middle ear effusion. No decreased hearing is noted.  Left Ear: Hearing, tympanic membrane, external ear and ear canal normal. No drainage, swelling or tenderness.  No middle ear effusion. No decreased hearing is noted.  Nose: Nose normal. No mucosal edema, rhinorrhea, sinus tenderness or septal deviation.  No foreign bodies. Right sinus exhibits no maxillary sinus tenderness and no frontal sinus tenderness. Left sinus exhibits no maxillary sinus tenderness and no frontal sinus tenderness.  Mouth/Throat: Uvula is midline, oropharynx is clear and moist and mucous membranes are normal. Mucous membranes are not pale, not dry and not cyanotic. He does not have dentures. No oral lesions. Normal dentition. No dental abscesses, uvula swelling, lacerations or dental caries. No oropharyngeal exudate, posterior oropharyngeal edema, posterior oropharyngeal erythema or tonsillar abscesses.  Eyes: Conjunctivae and EOM are normal. Pupils are equal, round, and reactive to light. Right eye exhibits no discharge. Left eye exhibits no discharge. No scleral icterus.  Neck: Normal range of motion. Neck supple. No JVD present. No tracheal deviation present. No thyromegaly present.  Cardiovascular: Normal rate, regular rhythm, normal heart sounds and intact distal pulses.  Exam reveals no gallop and no friction rub.   No murmur heard. Pulmonary/Chest: Effort normal and breath sounds normal. No respiratory distress. He has no wheezes. He has no rales. He exhibits no tenderness.  Abdominal: Soft. Bowel sounds are normal. He exhibits no distension and no mass. There is no tenderness. There is no rebound and no guarding.  Musculoskeletal: Normal range of motion. He exhibits no edema and no tenderness.  Lymphadenopathy:       Head (right side): No  submental, no submandibular, no tonsillar, no preauricular, no posterior auricular and no occipital adenopathy present.       Head (left side): No submental, no submandibular, no tonsillar, no preauricular, no posterior auricular and no occipital adenopathy present.    He has cervical adenopathy.       Right cervical: No superficial cervical, no deep cervical and no posterior cervical adenopathy present.      Left cervical: Deep cervical adenopathy present. No superficial cervical and no posterior cervical adenopathy present.    He has no axillary adenopathy.       Right: No inguinal, no supraclavicular and no epitrochlear adenopathy present.       Left: No inguinal, no supraclavicular and no epitrochlear adenopathy present.  Neurological: He is alert and oriented to person, place, and time. He has normal reflexes. No cranial nerve deficit. He exhibits normal muscle tone. Coordination normal.  Skin: Skin is warm and dry. No rash noted. He is not diaphoretic. No erythema. No pallor.  Psychiatric: He has a normal mood and affect. His behavior is normal. Judgment and thought content normal.          Assessment & Plan:

## 2011-02-23 NOTE — Assessment & Plan Note (Signed)
His BP is well controlled 

## 2011-03-09 ENCOUNTER — Encounter: Payer: Self-pay | Admitting: Endocrinology

## 2011-03-09 ENCOUNTER — Ambulatory Visit (INDEPENDENT_AMBULATORY_CARE_PROVIDER_SITE_OTHER): Payer: Medicare Other | Admitting: Endocrinology

## 2011-03-09 VITALS — BP 106/68 | HR 67 | Temp 98.1°F | Ht 75.5 in | Wt 229.0 lb

## 2011-03-09 DIAGNOSIS — D649 Anemia, unspecified: Secondary | ICD-10-CM

## 2011-03-09 DIAGNOSIS — Z0000 Encounter for general adult medical examination without abnormal findings: Secondary | ICD-10-CM

## 2011-03-09 NOTE — Patient Instructions (Signed)
please consider these measures for your health:  minimize alcohol.  do not use tobacco products.  have a colonoscopy at least every 10 years from age 76.  keep firearms safely stored.  always use seat belts.  have working smoke alarms in your home.  see an eye doctor and dentist regularly.  never drive under the influence of alcohol or drugs (including prescription drugs).   please let me know what your wishes would be, if artificial life support measures should become necessary.  it is critically important to prevent falling down (keep floor areas well-lit, dry, and free of loose objects) good diet and exercise habits significanly improve the control of your diabetes.  please let me know if you wish to be referred to a dietician.  high blood sugar is very risky to your health.  you should see an eye doctor every year. controlling your blood pressure and cholesterol drastically reduces the damage diabetes does to your body.  this also applies to quitting smoking.  please discuss these with your doctor.  you should take an aspirin every day, unless you have been advised by a doctor not to. Please make a follow-up appointment in 6 months.  please let me know if you want a prescription for a shingles vaccine.

## 2011-03-09 NOTE — Progress Notes (Signed)
Subjective:    Patient ID: Joseph Rocha, male    DOB: 03-Dec-1934, 76 y.o.   MRN: [id number]  HPI Subjective:   Patient here for Medicare annual wellness visit and management of other chronic and acute problems.      Risk factors: advanced age    Roster of Physicians Providing Medical Care to Patient: Urol: tannenbaum Cardiol: smith Allergy: eugene Rexford Neurosurg: roy Ortho: pt does not recall.    Activities of Daily Living: In your present state of health, do you have any difficulty performing the following activities?:  Preparing food and eating?: No  Bathing yourself: No  Getting dressed: No  Using the toilet:No  Moving around from place to place: No  In the past year have you fallen or had a near fall?:No    Home Safety: Has smoke detector and wears seat belts. No firearms.   Diet and Exercise  Current exercise habits:  Walks frequently, when knee and foot pain permits Dietary issues discussed: pt reports healthy diet   Depression Screen  Q1: Over the past two weeks, have you felt down, depressed or hopeless?no  Q2: Over the past two weeks, have you felt little interest or pleasure in doing things? no   The following portions of the patient's history were reviewed and updated as appropriate: allergies, current medications, past family history, past medical history, past social history, past surgical history and problem list.   Review of Systems  Denies hearing loss, and visual loss Objective:   Vision:  Sees opthalmologist Hearing: grossly normal Body mass index: see vs page Msk: pt easily and quickly performs "get-up-and-go" from a sitting position Cognitive Impairment Assessment: cognition, memory and judgment appear normal.  remembers 3/3 at 5 minutes.  excellent recall.  can easily read and write a sentence.  alert and oriented x 3    Assessment:   Medicare wellness utd on preventive parameters    Plan:   During the course of the visit the patient  was educated and counseled about appropriate screening and preventive services including:        Fall prevention Diabetes screening  Nutrition counseling   Vaccines / LABS Zostavax / Pnemonccoal Vaccine  today  PSA--sees urol  Patient Instructions (the written plan) was given to the patient.      Past Medical History  Diagnosis Date  . Diabetes mellitus   . Hyperlipidemia   . Anemia   . Hypertension   . Allergy   . Pituitary abnormality 2004  . OA (osteoarthritis)   . Elevated PSA   . Hypogonadism male     Past Surgical History  Procedure Date  . Spine surgery   . Joint replacement     History   Social History  . Marital Status: Married    Spouse Name: N/A    Number of Children: N/A  . Years of Education: N/A   Occupational History  . retired    Social History Main Topics  . Smoking status: Former Smoker    Quit date: 02/22/1957  . Smokeless tobacco: Not on file  . Alcohol Use: No  . Drug Use: No  . Sexually Active: Not Currently   Other Topics Concern  . Not on file   Social History Narrative  . No narrative on file    Current Outpatient Prescriptions on File Prior to Visit  Medication Sig Dispense Refill  . ezetimibe-simvastatin (VYTORIN) 10-80 MG per tablet Take 1 tablet by mouth at bedtime.  90 tablet  3  . fludrocortisone (FLORINEF) 0.1 MG tablet Take 1 tablet (0.1 mg total) by mouth daily.  90 tablet  3  . metFORMIN (GLUCOPHAGE XR) 500 MG 24 hr tablet Take 4 tablets by mouth once daily  360 tablet  1  . pioglitazone (ACTOS) 45 MG tablet Take 1 tablet (45 mg total) by mouth daily.  90 tablet  3    No Known Allergies  No family history on file.  BP 106/68  Pulse 67  Temp(Src) 98.1 F (36.7 C) (Oral)  Ht 6' 3.5" (1.918 m)  Wt 229 lb (103.874 kg)  BMI 28.25 kg/m2  SpO2 95%     Review of Systems     Objective:   Physical Exam        Assessment & Plan:  Wellness visit today, with problems stable, except as noted.

## 2011-03-23 NOTE — Discharge Summary (Signed)
NAMEKINGSTEN, ENFIELD NO.:  [id number]   MEDICAL RECORD NO.:  [id number]          PATIENT TYPE:  INP   LOCATION:                               FACILITY:  [facility]   PHYSICIAN:  Reinaldo Meeker, M.D. DATE OF BIRTH:  [birth year]   DATE OF ADMISSION:  10/03/2004  DATE OF DISCHARGE:  10/07/2004                                 DISCHARGE SUMMARY   PRIMARY DIAGNOSIS:  Pituitary adenoma.   PRIMARY OPERATIVE PROCEDURE:  Transsphenoidal approach for resection of  pituitary tumor, placement of lumbar drain.   HISTORY:  Mr. Hartt is a 76 year old gentleman with known pituitary  adenoma.  He has been followed with serial scans that have shown progressive  enlargement with some worsening of his vision, he is now admitted for  transsphenoidal approach for tumor removal.  On October 03, 2004 the  patient was taken to the operating room where he underwent the  transsphenoidal approach for resection of his pituitary tumor.  During the  time of his surgery there was some transudation of spinal fluid across the  diaphragma sellae, it was elected to put in a lumbar drain when the case was  completed.  This was done without difficulty.  The patient woke up from  doing well.  He never developed diabetes insipidus and his lumbar drain  functioned well.  After approximately three days, the drain was removed.  Dr. Ezzard Standing of ENT removed his nasal packing and the patient was able to  increase his activities without difficulty.  By October 07, 2004 the patient  was up, ambulating well, and tolerating a regular diet.  There was no  evidence of diabetes insipidus, his electrolytes were all normal.  It was  felt that he could be discharged home on pain medication and a steroid  replacement.  His condition was improved versus admission.       ___________________________________________  Reinaldo Meeker, M.D.    ROK/MEDQ  D:  03/29/2005  T:  03/29/2005  Job:  [id number]

## 2011-03-23 NOTE — Op Note (Signed)
   NAME:  Joseph Rocha, Joseph Rocha                         ACCOUNT NO.:  [id number]   MEDICAL RECORD NO.:  [id number]                   PATIENT TYPE:  OIB   LOCATION:  2857                                 FACILITY:  [facility]   PHYSICIAN:  Armanda Magic, M.D.                  DATE OF BIRTH:  07-Sep-1935   DATE OF PROCEDURE:  07/14/2003  DATE OF DISCHARGE:  07/14/2003                                 OPERATIVE REPORT   REFERRING PHYSICIAN:  Colleen Can. Deborah Chalk, M.D.   PROCEDURE PERFORMED:  Tilt table test.   INDICATIONS FOR PROCEDURE:  Syncope.   OPERATOR:  Armanda Magic, M.D.   COMPLICATIONS:  None.   This is a 76 year old male with a history of syncope and recent  hospitalization where evaluation thus far with work-up was negative.  He now  presents for tilt table test.   DESCRIPTION OF PROCEDURE:  The patient was brought to the cardiac  catheterization laboratory in the fasting, nonsedated state.  Informed  consent was obtained.  The patient was connected to continuous heart rate  and pulse oximetry monitoring, intermittent blood pressure monitoring.  The  patient's blood pressure was measured for five minutes supine.  The blood  pressure ranged from 118/79 to 133/80 mmHg with heart rates in the 50s.  The  patient was then tilted upright to 70 degrees for a total of 30 minutes.  The lowest blood pressure achieved was 109/79 mmHg with heart rates in the  60s.  The patient was then tilted back supine and Isuprel was started at 7mL  per hour which is 0.5 mcg.  This was titrated up to a total of 2 mcg to  increase the heart rate by 20%.  The patient was then tilted back up to 70  degrees for a total of 15 minutes.  The lowest blood pressure noted was  102/51 mmHg.  Heart rates were in the 70s to 90s.  The patient was  completely asymptomatic during the entire procedure.  At the end of the  procedure, the patient was tilted back supine.  All monitoring equipment was  removed and the patient was  discharged to home in stable condition.   ASSESSMENT AND PLAN:  1. Syncope.  2. Negative tilt table test for syncope.  3. Further work-up with Dr. Deborah Chalk.                                               Armanda Magic, M.D.    TT/MEDQ  D:  07/15/2003  T:  07/16/2003  Job:  [id number]   cc:   Colleen Can. Deborah Chalk, M.D.  Fax: [phone]

## 2011-03-23 NOTE — Consult Note (Signed)
[facility]                          ENDOCRINOLOGY CONSULTATION   NAME:Joseph Rocha, Joseph Rocha                      MRN:          [id number]  DATE:01/07/2007                            DOB:          Jun 09, 1935    PREOPERATIVE DIAGNOSES:   REFERRING PHYSICIAN:  Donalee Citrin, M.D.   HISTORY OF PRESENT ILLNESS:  The patient has a history of unexplained  syncope and hypotension, but he has not had any more episodes since  February 14 of this year.   He has a history of mild anemia with a negative workup, and his iron  level was slightly low recently.  He has had colonoscopy, which did not  reveal a cause.   Regarding his diabetes, he states it is well-controlled.   PAST MEDICAL HISTORY:  1. Elevated PSA, for which a biopsy was recently negative by Dr.      Patsi Sears.  2. Central hypogonadism.  He had to discontinue his testosterone gel,      due to elevated PSA.  3. Allergic rhinitis.  4. Pituitary adenoma, nonfunctioning.  5. Dyslipidemia.  6. Hemorrhoids.  7. Diverticulosis.  8. Osteoarthritis.   MEDICATIONS:  1. Celebrex 200 mg a day.  2. Robaxin 500 mg q.h.s.  3. Vytorin 10/80 one q.h.s.  4. Glucophage-XR 500 mg daily.  5. Florinef 100 micrograms daily.  6. Flomax 0.4 mg daily.  7. Allegra 180 mg daily.  8. Actos 45 mg daily.  9. Percocet 5/325 one as needed for pain.   REVIEW OF SYSTEMS:  Denies chest pain, shortness of breath and  hypoglycemia.   PHYSICAL EXAM:  Blood pressure 93/54, heart rate 59, temperature is  98.9, the weight is 223.  GENERAL:  No distress.  SKIN:  Not diaphoretic, no rash.  HEENT:  Pharynx is normal.  NECK:  Supple, no goiter.  CHEST:  Clear to auscultation, no respiratory distress.  CARDIOVASCULAR:  No JVD.  Trace bilateral pretibial edema, regular rate  and rhythm, no murmur.  Pedal pulses are intact and there is no bruit at  the carotid arteries.  NEUROLOGIC:  Alert, oriented.  Does not appear anxious nor  depressed and  gait is observed in the office to be normal.   LABORATORY STUDIES:  On December 24, 2006, BMET is normal.  CBC  remarkable for hemoglobin 11.5.  Iron saturation 17.  Vitamin B12 normal  at 392.  Hemoglobin A1c 6.   IMPRESSION:  1. History of recurrent hypotension and syncope, for which an      extensive workup has been negative.  2. Well-controlled diabetes.  3. History of anemia with a negative workup, but he now has a slightly      low iron level.   PLAN:  1. Take an iron tablet daily.  2. Refer back to GI to consider upper endoscopy to complete the workup      of his anemia.  3. Keep your appointment next week for your treadmill cardiac nuclear      study.  If this is negative, he is medically cleared for surgery,      in my opinion.  His risk is not zero, but I think it is acceptably      low.  4. Return in about three months.     Sean A. Everardo All, MD  Electronically Signed    SAE/MedQ  DD: 01/08/2007  DT: 01/08/2007  Job #: [id number]   cc:   Donalee Citrin, M.D.

## 2011-03-23 NOTE — Discharge Summary (Signed)
NAME:  Joseph Rocha, Joseph Rocha                         ACCOUNT NO.:  [id number]   MEDICAL RECORD NO.:  [id number]                   PATIENT TYPE:  INP   LOCATION:  2029                                 FACILITY:  [facility]   PHYSICIAN:  Wanda Plump, MD [facility]                 DATE OF BIRTH:  [birth year]   DATE OF ADMISSION:  06/16/2003  DATE OF DISCHARGE:  06/19/2003                                 DISCHARGE SUMMARY   ADMISSION DIAGNOSIS:  Syncope.   DISCHARGE DIAGNOSES:  1. Syncope.  2. Headaches.  3. Pituitary macroadenoma.  4. Bronchitis.  5. Dyslipidemia.  6. Type 2 diabetes.  7. Hypertension.  8. Benign prostatic hypertrophy.  9. Hemorrhoids.  10.      Diverticulosis.  11.      Osteoarthritis.   BRIEF HISTORY AND PHYSICAL:  Joseph Rocha is 76 year old male who was admitted  with a three-month history of bioccipital headaches, upper respiratory  infection symptoms for a few days and syncopal episode.  He felt very dizzy,  briefly lost consciousness after he was doing some heavy exertion at home.  911 was called and he was admitted through [facility].   PHYSICAL EXAMINATION:  VITAL SIGNS:  Upon arrival to the ER, his blood  pressure was 90/50, heart rate 60, temperature 98.4, weight 226.  NECK:  Supple.  CHEST:  Clear.  CARDIOVASCULAR:  No JVD, regular rate and rhythm, and no murmur.  ABDOMEN:  Benign.   LABORATORY DATA AND X-RAY:  Echocardiogram showed overall left ventricular  systolic function to be normal, EF 55-65%, left ventricular wall thickness,  mild increase. Chest x-ray showed mild cardiomegaly and suspicion for COPD.  TSH was 3.5.  UA negative.  Luteinizing hormone or LH was 0.8 which is  slightly low.  FSH was 1.6 which is normal and the prolactin level was 7.8  which is normal.  Creatinine was 1.1, sodium 135, potassium 3.9, BUN 14.  White count was 7, hemoglobin 11, platelets 166.  Cardiac enzymes showed  that all cardiac enzymes were slightly elevated but  the relative index was  normal at all times.  EKGs are not available to me at the time of this  dictation.   HOSPITAL COURSE:  Joseph Rocha was admitted to the hospital with above  problems.  He was kept on bed rest.  He basically became asymptomatic as  well as the syncopal episodes and headache.  He did complain of some cough,  chest and sinus congestion.  CT scan of the head was ordered and it did show  that the patient has homogeneously enhancing mass in the anterior cella,  etiology of which is uncertain.  Also a small lesion in the fourth  ventricle. MRI was pursued and the preliminary report stated that the cella  tumor is consistent with a macroadenoma.  There is minimal compression of  the left more than  right optic nerve.  The patient, however, is anterior  chamber as far as the vision is concerned.  The lesion at ventricle was  calcified and is most likely not related with the macroadenoma.  Cardiology  also evaluated the patient.  They were unable to get a tilt table test.  Consequently they signed off and they are planning to follow him as an  outpatient for a stress test and a tilt table.  At this point I think this  patient has reached maximal hospital benefit.  Per the MRI report, there is  nothing urgent that needs to be done.  Consequently he will go home and I  asked the patient to contact Dr. Everardo All next week for coordination of his  cardiac and neurological evaluation.   DISCHARGE MEDICATIONS:  He will continue with all his home medications plus  a Z-pack and albuterol two puffs q.i.d. p.r.n. because of the patient did  have some wheezing during his hospital stay.   DISCHARGE INSTRUCTIONS:  I encouraged him to return to the hospital if he  has severe headaches, nausea, visual problems, or another syncope.  He seems  to understand the plan of care.                                                  Wanda Plump, MD [facility]    JEP/MEDQ  D:  06/19/2003  T:  06/19/2003   Job:  [id number]   cc:   Gregary Signs A. Everardo All, M.D. [facility]

## 2011-03-23 NOTE — Op Note (Signed)
Joseph Rocha, Joseph Rocha               ACCOUNT NO.:  [id number]   MEDICAL RECORD NO.:  [id number]          PATIENT TYPE:  INP   LOCATION:  3019                         FACILITY:  [facility]   PHYSICIAN:  Reinaldo Meeker, M.D. DATE OF BIRTH:  1935/07/17   DATE OF PROCEDURE:  01/05/2005  DATE OF DISCHARGE:                                 OPERATIVE REPORT   PREOPERATIVE DIAGNOSES:  1.  Spondylosis, L3-4, L4-5, right.  2.  Possible herniated disk, L3-4, right.   POSTOPERATIVE DIAGNOSES:  1.  Spondylosis, L3-4 and L4-5, right.  2.  Herniated disk, L3-4, right.   PROCEDURE:  Right L3-4 and L4-5 decompressive laminotomies followed by right  L3-4 microdiskectomy.   SECONDARY PROCEDURE:  Microdissection of L3-4 disk and L4-5 disk, and L4 and  L5 nerve roots on the right.   SURGEON:  Reinaldo Meeker, M.D.   ASSISTANT:  Kathaleen Maser. Pool, M.D.   PROCEDURE IN DETAIL:  After being placed in the prone position, the  patient's back was prepped and draped in the usual sterile fashion.  A  localizing x-ray was taken prior to incision to identify the appropriate  level.  A midline incision was made above the spinous processes of L3, L4  and L5.  Using Bovie cutting current, the incision was carried down to the  spinous processes.  Subperiosteal dissection was then carried out on the  right side of the spinous processes and lamina and a self-retaining  retractor was placed for exposure.  X-rays showed first the appropriate  level.  Starting at L3-4, laminotomy was performed by removing the inferior  one-half of the L3 lamina, the medial one-third of the facet joint and the  superior one-half of the L4 lamina.  Residual bone and ligamentum flavum  were removed in a piecemeal fashion.  A similar decompression was carried  out on the patient's right side at L4-5, once again removing the inferior  one-third of the L4 lamina, the medial one-third of the facet joint and the  superior one-third of the L5  lamina.  Once again, residual bone and  ligamentum flavum were removed in piecemeal fashion.  The microscope was  draped, brought into the field and used for the remainder of the case.  L3-4  microdissection technique was used to identify the lateral aspect of the  thecal sac and L4 nerve root.  The nerve was found to be markedly compressed  and stretched.  It was gently mobilized away from the disk, which was found  to be herniated beneath it with a free fragment.  After coagulating on the  annulus, the annulus was incised with a 15 blade.  Using pituitary rongeurs  and curettes, thorough disk space cleanout was carried out; at the same  time, great care was taken to avoid injury to the __________  and this was  successful done.  Inspection at L4-5 showed no evidence of disk herniation.  At this time, large amounts of irrigation were carried out.  Any bleeding  was controlled with bipolar coagulation and Gelfoam.  Due to the muscle  oozing on the  walls of the incision, an epidural drain was left and  brought out through a separate stab wound incision.  The wound was then  closed in multiple layers with Vicryl in the muscle, fascia, subcutaneous  and subcuticular tissues, and staples on the skin.  A sterile dressing was  then applied.  The patient was extubated and taken to the recovery room in  stable condition.      ROK/MEDQ  D:  01/05/2005  T:  01/06/2005  Job:  [id number]

## 2011-03-23 NOTE — Consult Note (Signed)
NAME:  Joseph Rocha, Joseph Rocha                         ACCOUNT NO.:  [id number]   MEDICAL RECORD NO.:  [id number]                   PATIENT TYPE:  INP   LOCATION:  2029                                 FACILITY:  [facility]   PHYSICIAN:  Joseph Rocha, M.D.            DATE OF BIRTH:  1935/01/18   DATE OF CONSULTATION:  06/16/2003  DATE OF DISCHARGE:                                   CONSULTATION   Joseph Rocha is a 76 year old black male who has been married for 20 years.  He has two children.  He is a nonsmoker, no alcohol.  He is retired from the  post office.   On Sunday evening two days ago, he had a headache.  Monday he had a headache  and developed cough and thought it was a sinus headache.  He had no  temperature elevation.  He went to his M.D. yesterday, saw Dr. Jonny Rocha, and was  treated for a sinus infection.  He has started Rocha four prescriptions, which  include Keflex, Tussionex, Darvocet, and __________ yesterday.  He has felt  somewhat woozy on his stomach and was rolling with nausea.  He went to the  bathroom and felt lightheaded today, had a cold sweat, and his wife thought  that he was unconscious for about a minute or a minute and a half.  911 was  called.  He was taken to the [facility] emergency room.  His standing blood  pressure fell to 78.  He was subsequently evaluated in the emergency room  with hematocrit of 32, white count of 700, chemistries were negative.  He  was referred to Joseph Rocha office today and had a heart rate of 58 with  no acute changes, and it is felt that he needed hospital admission.  He  basically has done well today.   One year ago on a [place] cruise he did not eat much, he got on the ship, he  ate on the ship and subsequently while waiting for an information session,  he was standing and he passed out.  He subsequently needed stitches because  of the fall.  He did not take the cruise.  He vomited at that time and was  basically unaware of the  events around him.  In that time frame a year and a  half ago, he had stress test, EKG, and echocardiogram, which were basically  okay.  Fifteen years ago while having colonoscopy, he had laxatives and got  sick and vomited, and he had syncope.   PAST MEDICAL HISTORY:  No surgeries.   ALLERGIES:  Multiple allergies but no known drug allergies.   CURRENT MEDICATIONS:  1. Allegra.  2. Actos.  3. Metformin.  4. Lovastatin.  5. Flomax.  6. Aspirin.   MEDICAL PROBLEMS:  His diabetes, hypercholesterolemia, and BPH.   FAMILY HISTORY:  Father died of questionable cause.  Mother died of heart  failure,  hypertension, and elevated blood pressure.  One brother has  lymphoma.   REVIEW OF SYSTEMS:  He has chronic sinusitis, diabetes, which is well-  controlled, chronic allergies, elevated cholesterol, and BPH.   PHYSICAL EXAMINATION:  GENERAL:  He is a very pleasant black male.  VITAL SIGNS:  Heart rate is 57, blood pressure 103/67, respiratory rate is  20, temperature is 97.  HEENT:  An inflamed throat.  CHEST:  Lungs show a few wheezes.  CARDIAC:  Regular rate and rhythm.  ABDOMEN:  Soft, nontender.  EXTREMITIES:  Without edema.   EKG shows sinus bradycardia, otherwise unremarkable.   OVERALL IMPRESSION:  1. Vasovagal syncope.  2. Adult-onset diabetes mellitus.  3. Recent upper respiratory infection.  4. History of benign prostatic hypertrophy.  5. Hypercholesterolemia.   PLAN:  A 2D echocardiogram was performed, which shows basically normal left  ventricular function.  We will try to arrange a tilt table, either then or  as an outpatient, and will do outpatient stress testing especially in light  of his longstanding diabetes.                                               Joseph Rocha, M.D.    SNT/MEDQ  D:  06/18/2003  T:  06/18/2003  Job:  [id number]   cc:   Joseph Rocha, M.D. [facility]

## 2011-03-23 NOTE — Op Note (Signed)
NAMEEUSEVIO, SCHRIVER NO.:  [id number]   MEDICAL RECORD NO.:  [id number]          PATIENT TYPE:  INP   LOCATION:  2899                         FACILITY:  [facility]   PHYSICIAN:  Reinaldo Meeker, M.D. DATE OF BIRTH:  January 16, 1935   DATE OF PROCEDURE:  10/03/2004  DATE OF DISCHARGE:                                 OPERATIVE REPORT   PREOPERATIVE DIAGNOSIS:  Pituitary macroadenoma.   POSTOPERATIVE DIAGNOSIS:  Pituitary macroadenoma.   OPERATION PERFORMED:  Transsphenoidal approach for removal of pituitary  adenoma.   SECONDARY PROCEDURE:  Placement of lumbar drain and removal of abdominal fat  graft.   SURGEON:  1.  Reinaldo Meeker, M.D.  2.  Cristal Deer E. Ezzard Standing, M.D.   ASSISTANT:  Sherilyn Cooter A. Pool, M.D.   ANESTHESIA:  General.   DESCRIPTION OF PROCEDURE:  After being placed in semisitting position, three-  point pin fixation with the head turned towards the right shoulder, the  patient's abdominal region and nasal region were prepped and draped in the  usual sterile fashion.  A small incision was made to the right of the  umbilicus and carried through the subcutaneous tissue.  Subcutaneous fat was  then removed in a few large clumps and saved for packing in the sphenoid  sinus at the end of the case.  The wound was irrigated and any bleeding  controlled with unipolar coagulation.  It was then closed with multiple  layers of Vicryl and staples on the skin.  At the same time, Dr. Ezzard Standing did  standard approach along the nasal septum up the sphenoid sinus.  He removed  some septations within the sphenoid sinus, stripped the mucosa from that at  which point I assumed control of the case.  There was a small defect noted  in the sella.  This was enlarged to get a generous exposure of the dura.  The dura was then coagulated and incised in cruciate fashion with the four  leaves coagulated with unipolar coagulation.  The capsule of the tumor was  then easily entered  and pituitary tumor removed.  It was standardly soft and  suckable.  It was sent for frozen section which returned pituitary adenoma.  A generous cleaning of the tumor was carried out until no additional tumor  could be identified in any directions.  Slightly orangish normal pituitary  gland was noted towards the back of the pituitary fossa and appeared to be  intact.  At this time inspection was carried out in all directions for any  evidence of residual tumor and none could be identified.  When blood would  seep into the tumor bed, there was some mild suspicion there could be some  CSF coming down from the diaphragma sella and it was elected to put in a  lumbar drain at the end of the case.  The tumor bed was packed with a piece  of Gelfoam and then a piece of nasal septum was fashioned to the appropriate  size and shape to reform a sellar floor.  This was placed without difficulty  and bioglue was placed on  it to help seal it.  Fat was then placed in the  sphenoid sinus.  Dr. Ezzard Standing then closed the nasal cavity in appropriate  fashion.  When this was done, the patient was turned on his side.  Approximately, the L3-  4 level, lumbar drain was placed without difficulty.  Clear flow was noted  and it was secured to the skin in multiple ways to avoid any dislodging.  The patient was then turned back on his back and then extubated and taken to  post anesthesia care unit in stable condition.       ROK/MEDQ  D:  10/03/2004  T:  10/03/2004  Job:  [id number]

## 2011-03-23 NOTE — Discharge Summary (Signed)
NAMEKAPIL, PETROPOULOS NO.:  [id number]   MEDICAL RECORD NO.:  [id number]          PATIENT TYPE:  INP   LOCATION:                               FACILITY:  [facility]   PHYSICIAN:  Reinaldo Meeker, M.D. DATE OF BIRTH:  09-22-1935   DATE OF ADMISSION:  10/03/2004  DATE OF DISCHARGE:  10/07/2004                                 DISCHARGE SUMMARY   PRIMARY DIAGNOSIS:  Pituitary mass with macroadenoma.   PRIMARY OPERATIVE PROCEDURE:  Transsphenoidal approach for removal of a  tumor and placement of lumbar drain, and abdominal fat graft.   HISTORY:  Mr. Corallo is a 76 year old gentleman with a known pituitary  adenoma. Serial scans have shown progressive enlargement and visual  difficulty, and he is now admitted for transsphenoidal approach for tumor  removal. On November 29th the patient was taken to the operating room where  he underwent the previously mentioned procedure. He tolerated it well. He  had never developed any diabetes insipidus. Because of some apparent  transudation of spinal fluid at the time of surgery, it was elected to place  a lumbar drain which was kept in place for a few days. The lumbar drain was  removed on December 2nd and the patient was able to increase his activity  without difficulty. By December 3rd he was up, ambulating well, and  tolerating a regular diet. There was no evidence of CSF leak. His nasal  packing had been removed as well. It was felt he could be discharged home.   DISCHARGE MEDICATIONS:  Steroid replacement and pain medication. His  condition is markedly improved versus admission.                                        ___________________________________________  Reinaldo Meeker, M.D.    ROK/MEDQ  D:  03/08/2005  T:  03/08/2005  Job:  [id number]

## 2011-03-23 NOTE — Op Note (Signed)
Joseph Rocha, Joseph Rocha NO.:  [id number]   MEDICAL RECORD NO.:  [id number]          PATIENT TYPE:  INP   LOCATION:  2899                         FACILITY:  [facility]   PHYSICIAN:  Kristine Garbe. Ezzard Standing, M.D.DATE OF BIRTH:  [birth year]   DATE OF PROCEDURE:  10/03/2004  DATE OF DISCHARGE:                                 OPERATIVE REPORT   PREOPERATIVE DIAGNOSIS:  Pituitary macroadenoma.   POSTOPERATIVE DIAGNOSIS:  Pituitary macroadenoma.   OPERATION:  Transeptal, transsphenoidal resection of pituitary adenoma.   SURGEON:  Dillard Cannon, M.D.   COSURGEON:  Reinaldo Meeker, M.D.   ANESTHESIA:  General endotracheal.   COMPLICATIONS:  None.   BRIEF CLINICAL NOTE:  Joseph Rocha is a 76 year old gentleman who was  initially diagnosed with a pituitary mass about a year ago.  On followup  scan, this mass has substantially enlarged, and it is recommended that he  have the pituitary tumor removed.  On exam in the office, he has septal  deviation to the left with left-sided nasal congestion.  On review of the  MRI scan, he has a large clear sphenoid sinus.   He will be taken to the operating room at this time for transseptal,  transsphenoidal resection of pituitary tumor.   DESCRIPTION OF PROCEDURE:  The patient was positioned on the operating room  table by Dr. Gerlene Fee.  Nose was then prepped with a Betadine solution and  draped out in sterile towels.  The nose was further prepped with cotton  pledgets soaked in Afrin and septum floor of the nose was injected with  Xylocaine with epinephrine for hemostasis.  A standard hemitransfixion  incision was made along the collagen septum on the right side.  The  mucoperichondrial flap was elevated on the right side of the septum.  This  was thinned down to the floor where the mucoperiosteum was elevated off of  the floor of the nose.  The anterior 2.5 cm of cartilaginous septum was  observed.  This was pushed off the  crest.  Mucoperiosteum was elevated off  of the floor on the left side, and the cartilaginous septum was pushed to  the left airway.  A strip of the posterior cartilaginous septum was  preserved for later closure.  The mucoperiosteum was elevated on either side  of the bony septum posteriorly.  A Hardy retractor was then positioned at  the posterior end of the bony septum at the base of the sphenoid sinus.  Using a small osteotome, the sphenoid sinus was opened with osteotomies.  The opening of the sphenoid sinus was enlarged with the Kerrison forceps.  The mucosa was stripped out of the sphenoid sinus.  The floor of the sella  was exposed, and Dr. Renae Fickle came in to resect the pituitary microadenoma.  After resecting the adenoma, I was called back in for closure.  The sphenoid  sinus had been packed with fat.  The cartilaginous septum was placed back on  the maxillary crest in the midline.  The extended hemitransfixion incision  was closed with interrupted 4-0 Chromic suture.  The septum was basted  anteriorly with a 4-0 Chromic suture.  Silastic sheaths were placed on  either side of the septum and secured with a single 3-0 nylon suture.  The  nose was then packed with Telfa soaked in bacitracin ointment.  This was  secured around the nose with a single 2-0 silk suture.  The oropharyngeal  pack was removed.  Following the closure because of the concern about a  possible CSF leak, a lumbar drain was placed by Dr. Gerlene Fee.  The patient  tolerated the procedure well and was transferred to the recovery room and  postoperatively did well.   DISPOSITION:  I will plan on removing Mr. [place] nasal packing in three  days.  He will have a followup in my office in 7-10 days to have the septal  splints removed.       CEN/MEDQ  D:  10/03/2004  T:  10/03/2004  Job:  [id number]   cc:   Reinaldo Meeker, M.D.  [address]  [place]  [place] [address]  Fax: [phone]

## 2011-03-23 NOTE — H&P (Signed)
NAME:  DEMARRIO, Joseph Rocha                         ACCOUNT NO.:  [id number]   MEDICAL RECORD NO.:  [id number]                   PATIENT TYPE:  INP   LOCATION:  2029                                 FACILITY:  [facility]   PHYSICIAN:  Sean A. Everardo All, M.D. [facility]           DATE OF BIRTH:  1935-05-25   DATE OF ADMISSION:  06/16/2003  DATE OF DISCHARGE:                                HISTORY & PHYSICAL   REASON FOR ADMISSION:  Syncope.   HISTORY OF THE PRESENT ILLNESS:  The patient is a 76 year old man who states  he has had three months of a bioccipital headache.  He has been ill with a  minor upper respiratory infection for a few days.  Yesterday, however, he  was doing some heavy exertion, helping his son move some furniture.  He got  home and suddenly felt very dizzy.  His wife was an eye witness to the  event.  He became very sweaty, vomited, and the patient's wife states that  he became unresponsive for about one minute, as his eyes rolled back in his  head.  She called 911 and he was taken to [facility] Emergency Room, where  he was found to have a systolic blood pressure of 78.  Laboratory studies  there were unremarkable except for mild anemia.   Of note is that the patient had a presyncopal episode in early 2003 in  [place] while he was about to go on board a cruise ship.  He had a workup  for this including a neurology evaluation, which was negative, an  echocardiogram, and a treadmill Cardiolite study.   PAST MEDICAL HISTORY:  1. Allergic rhinitis.  2. Benign prostatic hypertrophy.  3. Dyslipidemia.  4. Hemorrhoids.  5. Diverticulosis.  6. Hypertension.  7. Osteoarthritis.  8. Type 2 diabetes.   MEDICATIONS:  1. Glucophage XR 1000 mg twice daily.  2. Folic acid 1 mg daily.  3. Actos 45 mg a day.  4. Mevacor 80 mg q.h.s.  5. Allegra 180 mg a day.  6. Aspirin 325 mg a day.  7. Flomax 0.4 mg a day.   SOCIAL HISTORY:  The patient is married; he is retired; his wife is  here.   FAMILY HISTORY:  Family history negative for unexplained syncope.   REVIEW OF SYSTEMS:  The patient states he has lost a few pounds recently but  he denies the following:  fever, chest pain, shortness of breath, rectal  bleeding, hematuria, abdominal pain, urinary incontinence, decreased urinary  force, anxiety and depression.   PHYSICAL EXAMINATION:  VITAL SIGNS:  Blood pressure 90/50, heart rate is 60,  temperature 98.4.  The weight is 226.  GENERAL:  No distress.  SKIN:  Skin not diaphoretic now.  HEENT:  The head is atraumatic, sclerae nonicteric.  Pharynx clear.  NECK:  Neck supple.  CHEST:  Chest clear to auscultation.  CARDIOVASCULAR:  No JVD and no  edema.  Regular rate and rhythm.  No murmur.  Pedal pulses are decreased but they are intact.  Carotids:  No bruit.  ABDOMEN:  Abdomen soft and nontender.  No hepatosplenomegaly.  No mass.  GENITAL AND RECTAL:  Examination not done at this time due to the patient's  condition.   ACCESSORY CLINICAL DATA:  Electrocardiogram shows mild sinus bradycardia.   IMPRESSION:  1. Recurrent syncope in a patient with risk factors for coronary disease.  2. Recent upper respiratory infection for which he was prescribed an     antihistamine and decongestant.  3. Other chronic medical problems as noted above.   PLAN:  1. I had a discussion with the patient regarding the risks of his not being     hospitalized.  I cannot say for sure what these are, but is possible that     he has a serious underlying medical illness which only hospitalization     can diagnose in a properly, timely way; he agrees to hospitalization.  2. Discontinue the medication he is on for his recent upper respiratory     infection.  3. Telemetry.  4. Check CPKs.  5. Consult cardiology.                                                Sean A. Everardo All, M.D. [facility]    SAE/MEDQ  D:  06/16/2003  T:  06/17/2003  Job:  [id number]

## 2011-06-18 ENCOUNTER — Telehealth: Payer: Self-pay

## 2011-06-18 DIAGNOSIS — M5030 Other cervical disc degeneration, unspecified cervical region: Secondary | ICD-10-CM

## 2011-06-18 DIAGNOSIS — M216X9 Other acquired deformities of unspecified foot: Secondary | ICD-10-CM

## 2011-06-18 NOTE — Telephone Encounter (Signed)
Refer to stern done as per request - thanks

## 2011-06-18 NOTE — Telephone Encounter (Signed)
Pt called requesting new referral to Dr Venetia Maxon at [facility]. Previous MD, Dr Channing Mutters has recently moved his practice.

## 2011-06-19 NOTE — Telephone Encounter (Signed)
Pt advised and will expect a call from [facility] with appt info 

## 2011-06-26 ENCOUNTER — Ambulatory Visit (INDEPENDENT_AMBULATORY_CARE_PROVIDER_SITE_OTHER): Payer: Medicare Other | Admitting: Endocrinology

## 2011-06-26 ENCOUNTER — Encounter: Payer: Self-pay | Admitting: Endocrinology

## 2011-06-26 DIAGNOSIS — M1990 Unspecified osteoarthritis, unspecified site: Secondary | ICD-10-CM

## 2011-06-26 MED ORDER — DICLOFENAC SODIUM 1.5 % TD SOLN: 5.0000 [drp] | Freq: Four times a day (QID) | TRANSDERMAL | Status: DC | PRN

## 2011-06-26 MED ORDER — HYDROCODONE-ACETAMINOPHEN 5-325 MG PO TABS: 1.0000 | ORAL_TABLET | ORAL | Status: AC | PRN

## 2011-06-26 NOTE — Patient Instructions (Addendum)
In my opinion, you could skip this year's mri of the pituitary. i have sent a prescription to your pharmacy, for a pain drops. Here is a prescription for a pain medication by mouth.

## 2011-06-26 NOTE — Progress Notes (Signed)
  Subjective:    Patient ID: Joseph Rocha, male    DOB: 24-Jun-1935, 76 y.o.   MRN: [id number]  HPI Pt states few weeks of moderate pain at the left/posterior neck.  No assoc radiation.  He has had an mri of the neck in the past (disc dz, non-surgical), and he has an appointment with dr Venetia Maxon in 6 weeks.   Past Medical History  Diagnosis Date  . Diabetes mellitus   . Hyperlipidemia   . Anemia   . Hypertension   . Allergy   . Pituitary abnormality 2004  . OA (osteoarthritis)   . Elevated PSA   . Hypogonadism male     Past Surgical History  Procedure Date  . Spine surgery   . Joint replacement   . Transphenoidal / transnasal hypophysectomy / resection pituitary tumor 2005  . Electrocardiogram 03/25/2006  . Doppler echocardiography 12/04/2001    History   Social History  . Marital Status: Married    Spouse Name: N/A    Number of Children: N/A  . Years of Education: N/A   Occupational History  . retired    Social History Main Topics  . Smoking status: Former Smoker    Quit date: 02/22/1957  . Smokeless tobacco: Not on file  . Alcohol Use: No  . Drug Use: No  . Sexually Active: Not Currently   Other Topics Concern  . Not on file   Social History Narrative  . No narrative on file    Current Outpatient Prescriptions on File Prior to Visit  Medication Sig Dispense Refill  . ezetimibe-simvastatin (VYTORIN) 10-80 MG per tablet Take 1 tablet by mouth at bedtime.  90 tablet  3  . fludrocortisone (FLORINEF) 0.1 MG tablet Take 1 tablet (0.1 mg total) by mouth daily.  90 tablet  3  . metFORMIN (GLUCOPHAGE XR) 500 MG 24 hr tablet Take 4 tablets by mouth once daily  360 tablet  1  . pioglitazone (ACTOS) 45 MG tablet Take 1 tablet (45 mg total) by mouth daily.  90 tablet  3    No Known Allergies  Family History  Problem Relation Age of Onset  . Cancer Brother     uncertain type    BP 122/80  Pulse 57  Temp(Src) 97.8 F (36.6 C) (Oral)  Ht 6' 3.5" (1.918 m)  Wt  224 lb 3.2 oz (101.696 kg)  BMI 27.65 kg/m2  SpO2 97%   Review of Systems Denies rash or numbness.      Objective:   Physical Exam GENERAL: no distress Neck: full rom, without pain Neuro: sensation is intact to touch on the hands Strength: normal throughout the hands.     Assessment & Plan:  Neck pain due to oa, recurrrent

## 2011-07-23 ENCOUNTER — Telehealth: Payer: Self-pay | Admitting: *Deleted

## 2011-07-23 NOTE — Telephone Encounter (Signed)
Pt wants alternative to Actos-pt states rx is too expensive

## 2011-07-24 NOTE — Telephone Encounter (Signed)
actos has been generic x 1 month.  It will get cheaper soon.  If you can't buy it, stop it, and return here 4-6 weeks.  It will take time to leave your body.  We'll address options then.

## 2011-07-24 NOTE — Telephone Encounter (Signed)
Pt informed of MD's advisement. He states that he will contact pharmacy for generic version and contact office if he cannot afford it.

## 2011-07-25 ENCOUNTER — Other Ambulatory Visit: Payer: Self-pay | Admitting: *Deleted

## 2011-07-25 DIAGNOSIS — E119 Type 2 diabetes mellitus without complications: Secondary | ICD-10-CM

## 2011-07-25 MED ORDER — PIOGLITAZONE HCL 45 MG PO TABS: 45.0000 mg | ORAL_TABLET | Freq: Every day | ORAL | Status: DC

## 2011-07-25 NOTE — Telephone Encounter (Signed)
R'cd fax from [facility] for refill of generic Actos  Last OV-06/26/2011

## 2011-08-21 ENCOUNTER — Telehealth: Payer: Self-pay

## 2011-08-21 NOTE — Telephone Encounter (Signed)
Pt called requesting Vytorin be changed to generic Lipitor due to cost

## 2011-08-22 MED ORDER — ATORVASTATIN CALCIUM 80 MG PO TABS: 80.0000 mg | ORAL_TABLET | Freq: Every day | ORAL | Status: DC

## 2011-08-22 NOTE — Telephone Encounter (Signed)
i changed, and sent rx 

## 2011-08-22 NOTE — Telephone Encounter (Signed)
Pt informed

## 2011-08-29 ENCOUNTER — Other Ambulatory Visit: Payer: Self-pay | Admitting: Endocrinology

## 2011-11-02 ENCOUNTER — Encounter: Payer: Self-pay | Admitting: Internal Medicine

## 2011-11-02 ENCOUNTER — Ambulatory Visit (INDEPENDENT_AMBULATORY_CARE_PROVIDER_SITE_OTHER): Payer: Medicare Other | Admitting: Internal Medicine

## 2011-11-02 VITALS — BP 120/70 | HR 60 | Temp 98.1°F

## 2011-11-02 DIAGNOSIS — J069 Acute upper respiratory infection, unspecified: Secondary | ICD-10-CM

## 2011-11-02 DIAGNOSIS — J209 Acute bronchitis, unspecified: Secondary | ICD-10-CM

## 2011-11-02 DIAGNOSIS — J302 Other seasonal allergic rhinitis: Secondary | ICD-10-CM

## 2011-11-02 DIAGNOSIS — J309 Allergic rhinitis, unspecified: Secondary | ICD-10-CM

## 2011-11-02 MED ORDER — HYDROCODONE-HOMATROPINE 5-1.5 MG/5ML PO SYRP
5.0000 mL | ORAL_SOLUTION | Freq: Four times a day (QID) | ORAL | Status: DC | PRN
Start: 2011-11-02 — End: 2012-12-29

## 2011-11-02 MED ORDER — DOXYCYCLINE HYCLATE 100 MG PO TABS: 100.0000 mg | ORAL_TABLET | Freq: Two times a day (BID) | ORAL | Status: AC

## 2011-11-02 NOTE — Progress Notes (Signed)
  Subjective:    HPI  complains of cold symptoms  Onset >1 week ago, wax/wane symptoms  Initially associated with rhinorrhea, sneezing, sore throat, mild headache and low grade fever Also myalgias, sinus pressure and mild-mod chest congestion >cough symptoms worse at night No relief with OTC meds Precipitated by sick contacts  Past Medical History  Diagnosis Date  . Diabetes mellitus   . Hyperlipidemia   . Anemia   . Hypertension   . Allergy   . Pituitary abnormality 2004  . OA (osteoarthritis)   . Elevated PSA   . Hypogonadism male     Review of Systems Constitutional: No night sweats, no unexpected weight change Pulmonary: No pleurisy or hemoptysis Cardiovascular: No chest pain or palpitations     Objective:   Physical Exam BP 120/70  Pulse 60  Temp(Src) 98.1 F (36.7 C) (Oral)  SpO2 96% GEN: mildly ill appearing and audible hchest congestion HENT: NCAT, mild sinus tenderness bilaterally, nares with clear discharge, oropharynx mild erythema, no exudate Eyes: Vision grossly intact, no conjunctivitis Lungs: Few rhonchi - no wheeze, no increased work of breathing Cardiovascular: Regular rate and rhythm, no bilateral edema      Assessment & Plan:  Viral URI >> now bronchitis allergic rhinitis, chronic  Cough, postnasal drip related to above    Empiric antibiotics prescribed due to symptom duration greater than 7 days Prescription cough suppression - new prescriptions done Symptomatic care with Tylenol or Advil, hydration and rest -  salt gargle advised as needed

## 2011-11-02 NOTE — Patient Instructions (Signed)
It was good to see you today. Doxycycline antibiotics twice a day for one week, also prescription cough syrup for bronchitis symptoms Your prescription(s) have been submitted to your pharmacy. Please take as directed and contact our office if you believe you are having problem(s) with the medication(s). Alternate between ibuprofen and tylenol for aches, pain and fever symptoms as discussed Use warm salt water gargle for sore throat as needed Hydrate, rest and call if symptoms worse or unimproved in next 7-10 days

## 2011-11-07 DIAGNOSIS — J309 Allergic rhinitis, unspecified: Secondary | ICD-10-CM | POA: Diagnosis not present

## 2011-11-14 DIAGNOSIS — J309 Allergic rhinitis, unspecified: Secondary | ICD-10-CM | POA: Diagnosis not present

## 2011-11-19 ENCOUNTER — Other Ambulatory Visit: Payer: Self-pay | Admitting: Endocrinology

## 2011-11-21 DIAGNOSIS — J309 Allergic rhinitis, unspecified: Secondary | ICD-10-CM | POA: Diagnosis not present

## 2011-11-28 DIAGNOSIS — J309 Allergic rhinitis, unspecified: Secondary | ICD-10-CM | POA: Diagnosis not present

## 2011-12-04 DIAGNOSIS — J309 Allergic rhinitis, unspecified: Secondary | ICD-10-CM | POA: Diagnosis not present

## 2011-12-08 ENCOUNTER — Other Ambulatory Visit: Payer: Self-pay | Admitting: Endocrinology

## 2011-12-13 DIAGNOSIS — J309 Allergic rhinitis, unspecified: Secondary | ICD-10-CM | POA: Diagnosis not present

## 2011-12-18 DIAGNOSIS — J309 Allergic rhinitis, unspecified: Secondary | ICD-10-CM | POA: Diagnosis not present

## 2011-12-26 DIAGNOSIS — J309 Allergic rhinitis, unspecified: Secondary | ICD-10-CM | POA: Diagnosis not present

## 2012-01-02 DIAGNOSIS — J309 Allergic rhinitis, unspecified: Secondary | ICD-10-CM | POA: Diagnosis not present

## 2012-01-08 DIAGNOSIS — J3089 Other allergic rhinitis: Secondary | ICD-10-CM | POA: Diagnosis not present

## 2012-01-08 DIAGNOSIS — J309 Allergic rhinitis, unspecified: Secondary | ICD-10-CM | POA: Diagnosis not present

## 2012-01-14 ENCOUNTER — Other Ambulatory Visit: Payer: Self-pay | Admitting: Neurosurgery

## 2012-01-14 DIAGNOSIS — D352 Benign neoplasm of pituitary gland: Secondary | ICD-10-CM

## 2012-01-14 DIAGNOSIS — M542 Cervicalgia: Secondary | ICD-10-CM

## 2012-01-16 DIAGNOSIS — J309 Allergic rhinitis, unspecified: Secondary | ICD-10-CM | POA: Diagnosis not present

## 2012-01-21 ENCOUNTER — Ambulatory Visit
Admission: RE | Admit: 2012-01-21 | Discharge: 2012-01-21 | Disposition: A | Discharge status: Home / Self Care | Payer: Medicare Other | Source: Ambulatory Visit | Attending: Neurosurgery | Admitting: Neurosurgery

## 2012-01-21 ENCOUNTER — Other Ambulatory Visit: Payer: Medicare Other

## 2012-01-21 DIAGNOSIS — M47812 Spondylosis without myelopathy or radiculopathy, cervical region: Secondary | ICD-10-CM | POA: Diagnosis not present

## 2012-01-21 DIAGNOSIS — M5020 Other cervical disc displacement, unspecified cervical region: Secondary | ICD-10-CM | POA: Diagnosis not present

## 2012-01-21 DIAGNOSIS — M542 Cervicalgia: Secondary | ICD-10-CM

## 2012-01-21 DIAGNOSIS — D497 Neoplasm of unspecified behavior of endocrine glands and other parts of nervous system: Secondary | ICD-10-CM | POA: Diagnosis not present

## 2012-01-21 DIAGNOSIS — D352 Benign neoplasm of pituitary gland: Secondary | ICD-10-CM

## 2012-01-21 MED ORDER — GADOBENATE DIMEGLUMINE 529 MG/ML IV SOLN
14.0000 mL | Freq: Once | INTRAVENOUS | Status: AC | PRN
  Administered 2012-01-21: 14 mL via INTRAVENOUS

## 2012-01-23 DIAGNOSIS — J309 Allergic rhinitis, unspecified: Secondary | ICD-10-CM | POA: Diagnosis not present

## 2012-01-30 DIAGNOSIS — J309 Allergic rhinitis, unspecified: Secondary | ICD-10-CM | POA: Diagnosis not present

## 2012-02-04 ENCOUNTER — Other Ambulatory Visit: Payer: Self-pay | Admitting: Endocrinology

## 2012-02-04 DIAGNOSIS — E559 Vitamin D deficiency, unspecified: Secondary | ICD-10-CM | POA: Diagnosis not present

## 2012-02-04 DIAGNOSIS — N401 Enlarged prostate with lower urinary tract symptoms: Secondary | ICD-10-CM | POA: Diagnosis not present

## 2012-02-04 DIAGNOSIS — R3913 Splitting of urinary stream: Secondary | ICD-10-CM | POA: Diagnosis not present

## 2012-02-04 DIAGNOSIS — E291 Testicular hypofunction: Secondary | ICD-10-CM | POA: Diagnosis not present

## 2012-02-06 DIAGNOSIS — J309 Allergic rhinitis, unspecified: Secondary | ICD-10-CM | POA: Diagnosis not present

## 2012-02-07 DIAGNOSIS — R972 Elevated prostate specific antigen [PSA]: Secondary | ICD-10-CM | POA: Diagnosis not present

## 2012-02-07 DIAGNOSIS — E291 Testicular hypofunction: Secondary | ICD-10-CM | POA: Diagnosis not present

## 2012-02-07 DIAGNOSIS — N401 Enlarged prostate with lower urinary tract symptoms: Secondary | ICD-10-CM | POA: Diagnosis not present

## 2012-02-13 ENCOUNTER — Ambulatory Visit (INDEPENDENT_AMBULATORY_CARE_PROVIDER_SITE_OTHER): Payer: Medicare Other | Admitting: Endocrinology

## 2012-02-13 ENCOUNTER — Encounter: Payer: Self-pay | Admitting: Endocrinology

## 2012-02-13 VITALS — BP 110/74 | HR 63 | Temp 98.2°F | Ht 75.5 in | Wt 217.0 lb

## 2012-02-13 DIAGNOSIS — M1990 Unspecified osteoarthritis, unspecified site: Secondary | ICD-10-CM

## 2012-02-13 DIAGNOSIS — D649 Anemia, unspecified: Secondary | ICD-10-CM | POA: Diagnosis not present

## 2012-02-13 DIAGNOSIS — E119 Type 2 diabetes mellitus without complications: Secondary | ICD-10-CM | POA: Diagnosis not present

## 2012-02-13 DIAGNOSIS — E237 Disorder of pituitary gland, unspecified: Secondary | ICD-10-CM

## 2012-02-13 DIAGNOSIS — G622 Polyneuropathy due to other toxic agents: Secondary | ICD-10-CM

## 2012-02-13 DIAGNOSIS — E7800 Pure hypercholesterolemia, unspecified: Secondary | ICD-10-CM

## 2012-02-13 DIAGNOSIS — E291 Testicular hypofunction: Secondary | ICD-10-CM

## 2012-02-13 DIAGNOSIS — G619 Inflammatory polyneuropathy, unspecified: Secondary | ICD-10-CM

## 2012-02-13 DIAGNOSIS — Z79899 Other long term (current) drug therapy: Secondary | ICD-10-CM | POA: Insufficient documentation

## 2012-02-13 DIAGNOSIS — J309 Allergic rhinitis, unspecified: Secondary | ICD-10-CM | POA: Diagnosis not present

## 2012-02-13 MED ORDER — TRAMADOL HCL 50 MG PO TABS: 50.0000 mg | ORAL_TABLET | Freq: Four times a day (QID) | ORAL | Status: DC | PRN

## 2012-02-13 MED ORDER — OXYCODONE-ACETAMINOPHEN 10-325 MG PO TABS: 1.0000 | ORAL_TABLET | Freq: Three times a day (TID) | ORAL | Status: AC | PRN

## 2012-02-13 NOTE — Patient Instructions (Addendum)
Come back after 03/08/12, for a "medicare wellness" visit.   Please do blood tests after 02/23/12.   i refilled the tramadol.  Here is a prescription to take in case the pain is too much for the tramadol.  It is not safe to take both together.

## 2012-02-13 NOTE — Progress Notes (Signed)
  Subjective:    Patient ID: Joseph Rocha, male    DOB: 1934-11-26, 77 y.o.   MRN: [id number]  HPI Pt states few years of intermittent moderate pain at the back of the neck.  No assoc numbness.  He says tramadol usually helps, but not always. Past Medical History  Diagnosis Date  . Diabetes mellitus   . Hyperlipidemia   . Anemia   . Hypertension   . Allergy   . Pituitary abnormality 2004  . OA (osteoarthritis)   . Elevated PSA   . Hypogonadism male     Past Surgical History  Procedure Date  . Spine surgery   . Joint replacement   . Transphenoidal / transnasal hypophysectomy / resection pituitary tumor 2005  . Electrocardiogram 03/25/2006  . Doppler echocardiography 12/04/2001    History   Social History  . Marital Status: Married    Spouse Name: N/A    Number of Children: N/A  . Years of Education: N/A   Occupational History  . retired    Social History Main Topics  . Smoking status: Former Smoker    Quit date: 02/22/1957  . Smokeless tobacco: Not on file  . Alcohol Use: No  . Drug Use: No  . Sexually Active: Not Currently   Other Topics Concern  . Not on file   Social History Narrative  . No narrative on file    Current Outpatient Prescriptions on File Prior to Visit  Medication Sig Dispense Refill  . fexofenadine (ALLEGRA) 180 MG tablet Take 180 mg by mouth daily.        . finasteride (PROSCAR) 5 MG tablet Take 5 mg by mouth daily.        . fludrocortisone (FLORINEF) 0.1 MG tablet Take 1 tablet (0.1 mg total) by mouth daily.  90 tablet  3  . metFORMIN (GLUCOPHAGE-XR) 500 MG 24 hr tablet TAKE 4 TABLETS ONCE DAILY  360 tablet  2  . methocarbamol (ROBAXIN) 500 MG tablet take 1 tablet by mouth once daily at bedtime  30 tablet  5  . Multiple Vitamins-Minerals (CENTRUM SILVER PO) Take 1 tablet by mouth daily.        . pioglitazone (ACTOS) 45 MG tablet TAKE 1 TABLET DAILY  90 tablet  1  . Tamsulosin HCl (FLOMAX) 0.4 MG CAPS Take 0.4 mg by mouth daily.          Marland Kitchen DISCONTD: traMADol (ULTRAM) 50 MG tablet Take 50 mg by mouth every 6 (six) hours as needed.        Marland Kitchen atorvastatin (LIPITOR) 80 MG tablet Take 1 tablet (80 mg total) by mouth daily.  90 tablet  3  . Diclofenac Sodium (PENNSAID) 1.5 % SOLN Place 5 drops onto the skin 4 (four) times daily as needed (pain).  150 mL  1    No Known Allergies  Family History  Problem Relation Age of Onset  . Cancer Brother     uncertain type    BP 110/74  Pulse 63  Temp(Src) 98.2 F (36.8 C) (Oral)  Ht 6' 3.5" (1.918 m)  Wt 217 lb (98.431 kg)  BMI 26.77 kg/m2  SpO2 97%  Review of Systems Denies muscle weakness.      Objective:   Physical Exam VITAL SIGNS:  See vs page. GENERAL: no distress. Neck: full rom, but rom is painful.        Assessment & Plan:  neck pain due to oa, needs increased rx

## 2012-02-25 DIAGNOSIS — M47817 Spondylosis without myelopathy or radiculopathy, lumbosacral region: Secondary | ICD-10-CM | POA: Diagnosis not present

## 2012-02-25 DIAGNOSIS — J309 Allergic rhinitis, unspecified: Secondary | ICD-10-CM | POA: Diagnosis not present

## 2012-03-05 DIAGNOSIS — J309 Allergic rhinitis, unspecified: Secondary | ICD-10-CM | POA: Diagnosis not present

## 2012-03-12 DIAGNOSIS — J309 Allergic rhinitis, unspecified: Secondary | ICD-10-CM | POA: Diagnosis not present

## 2012-03-14 ENCOUNTER — Encounter: Payer: Self-pay | Admitting: Endocrinology

## 2012-03-14 ENCOUNTER — Other Ambulatory Visit (INDEPENDENT_AMBULATORY_CARE_PROVIDER_SITE_OTHER): Payer: Medicare Other

## 2012-03-14 ENCOUNTER — Ambulatory Visit (INDEPENDENT_AMBULATORY_CARE_PROVIDER_SITE_OTHER): Payer: Medicare Other | Admitting: Endocrinology

## 2012-03-14 VITALS — BP 110/72 | HR 48 | Temp 97.5°F | Ht 75.5 in | Wt 214.0 lb

## 2012-03-14 DIAGNOSIS — D649 Anemia, unspecified: Secondary | ICD-10-CM

## 2012-03-14 DIAGNOSIS — Z79899 Other long term (current) drug therapy: Secondary | ICD-10-CM | POA: Diagnosis not present

## 2012-03-14 DIAGNOSIS — G622 Polyneuropathy due to other toxic agents: Secondary | ICD-10-CM

## 2012-03-14 DIAGNOSIS — E119 Type 2 diabetes mellitus without complications: Secondary | ICD-10-CM | POA: Diagnosis not present

## 2012-03-14 DIAGNOSIS — I10 Essential (primary) hypertension: Secondary | ICD-10-CM

## 2012-03-14 DIAGNOSIS — M810 Age-related osteoporosis without current pathological fracture: Secondary | ICD-10-CM

## 2012-03-14 DIAGNOSIS — I498 Other specified cardiac arrhythmias: Secondary | ICD-10-CM | POA: Diagnosis not present

## 2012-03-14 DIAGNOSIS — E7800 Pure hypercholesterolemia, unspecified: Secondary | ICD-10-CM

## 2012-03-14 DIAGNOSIS — Z0000 Encounter for general adult medical examination without abnormal findings: Secondary | ICD-10-CM

## 2012-03-14 DIAGNOSIS — IMO0001 Reserved for inherently not codable concepts without codable children: Secondary | ICD-10-CM

## 2012-03-14 LAB — HEMOGLOBIN A1C: Hgb A1c MFr Bld: 5.9 % (ref 4.6–6.5)

## 2012-03-14 LAB — CBC WITH DIFFERENTIAL/PLATELET
Basophils Relative: 0.6 % (ref 0.0–3.0)
Eosinophils Relative: 2.7 % (ref 0.0–5.0)
HCT: 38 % — ABNORMAL LOW (ref 39.0–52.0)
Monocytes Relative: 10.7 % (ref 3.0–12.0)
Neutrophils Relative %: 49 % (ref 43.0–77.0)
Platelets: 179 10*3/uL (ref 150.0–400.0)
RBC: 4.06 Mil/uL — ABNORMAL LOW (ref 4.22–5.81)
WBC: 6.7 10*3/uL (ref 4.5–10.5)

## 2012-03-14 LAB — URINALYSIS, ROUTINE W REFLEX MICROSCOPIC
Specific Gravity, Urine: 1.02 (ref 1.000–1.030)
Total Protein, Urine: NEGATIVE
Urine Glucose: NEGATIVE
pH: 6.5 (ref 5.0–8.0)

## 2012-03-14 LAB — LIPID PANEL
Cholesterol: 151 mg/dL (ref 0–200)
Total CHOL/HDL Ratio: 3
Triglycerides: 44 mg/dL (ref 0.0–149.0)

## 2012-03-14 LAB — BASIC METABOLIC PANEL
CO2: 29 mEq/L (ref 19–32)
Chloride: 105 mEq/L (ref 96–112)
Creatinine, Ser: 1.2 mg/dL (ref 0.4–1.5)

## 2012-03-14 LAB — IBC PANEL
Iron: 78 ug/dL (ref 42–165)
Transferrin: 196.4 mg/dL — ABNORMAL LOW (ref 212.0–360.0)

## 2012-03-14 LAB — MICROALBUMIN / CREATININE URINE RATIO: Microalb Creat Ratio: 0.4 mg/g (ref 0.0–30.0)

## 2012-03-14 LAB — HEPATIC FUNCTION PANEL
Bilirubin, Direct: 0.2 mg/dL (ref 0.0–0.3)
Total Bilirubin: 0.8 mg/dL (ref 0.3–1.2)

## 2012-03-14 NOTE — Progress Notes (Signed)
Subjective:    Patient ID: Joseph Rocha, male    DOB: 06/01/1935, 77 y.o.   MRN: [id number]  HPI The state of at least three ongoing medical problems is addressed today: Anemia:  W/u has been neg in the past.  Denies brbpr. Bradycardia is again noted: Denies dizziness. Dyslipidemia: denies chest pain. Past Medical History  Diagnosis Date  . Diabetes mellitus   . Hyperlipidemia   . Anemia   . Hypertension   . Allergy   . Pituitary abnormality 2004  . OA (osteoarthritis)   . Elevated PSA   . Hypogonadism male     Past Surgical History  Procedure Date  . Spine surgery   . Joint replacement   . Transphenoidal / transnasal hypophysectomy / resection pituitary tumor 2005  . Electrocardiogram 03/25/2006  . Doppler echocardiography 12/04/2001    History   Social History  . Marital Status: Married    Spouse Name: N/A    Number of Children: N/A  . Years of Education: N/A   Occupational History  . retired    Social History Main Topics  . Smoking status: Former Smoker    Quit date: 02/22/1957  . Smokeless tobacco: Not on file  . Alcohol Use: No  . Drug Use: No  . Sexually Active: Not Currently   Other Topics Concern  . Not on file   Social History Narrative  . No narrative on file    Current Outpatient Prescriptions on File Prior to Visit  Medication Sig Dispense Refill  . atorvastatin (LIPITOR) 80 MG tablet Take 1 tablet (80 mg total) by mouth daily.  90 tablet  3  . fexofenadine (ALLEGRA) 180 MG tablet Take 180 mg by mouth daily.        . finasteride (PROSCAR) 5 MG tablet Take 5 mg by mouth daily.        . metFORMIN (GLUCOPHAGE-XR) 500 MG 24 hr tablet TAKE 4 TABLETS ONCE DAILY  360 tablet  2  . pioglitazone (ACTOS) 45 MG tablet TAKE 1 TABLET DAILY  90 tablet  1  . Tamsulosin HCl (FLOMAX) 0.4 MG CAPS Take 0.4 mg by mouth daily.        . Diclofenac Sodium (PENNSAID) 1.5 % SOLN Place 5 drops onto the skin 4 (four) times daily as needed (pain).  150 mL  1  .  fludrocortisone (FLORINEF) 0.1 MG tablet Take 1 tablet (0.1 mg total) by mouth daily.  90 tablet  3  . methocarbamol (ROBAXIN) 500 MG tablet take 1 tablet by mouth once daily at bedtime  30 tablet  5  . Multiple Vitamins-Minerals (CENTRUM SILVER PO) Take 1 tablet by mouth daily.        . traMADol (ULTRAM) 50 MG tablet Take 1 tablet (50 mg total) by mouth every 6 (six) hours as needed.  100 tablet  11    No Known Allergies  Family History  Problem Relation Age of Onset  . Cancer Brother     uncertain type    BP 110/72  Pulse 48  Temp(Src) 97.5 F (36.4 C) (Oral)  Ht 6' 3.5" (1.918 m)  Wt 214 lb (97.07 kg)  BMI 26.40 kg/m2  SpO2 98%    Review of Systems  Respiratory: Negative for shortness of breath.   Genitourinary: Negative for hematuria.  Neurological: Negative for syncope.       Objective:   Physical Exam VITAL SIGNS:  See vs page GENERAL: no distress LUNGS:  Clear to auscultation HEART:  Regular  rate and rhythm without murmurs noted. Normal S1,S2.   Pulses: dorsalis pedis intact bilat.  No carotid bruit. Feet: no deformity.  no ulcer on the feet.  feet are of normal color and temp.  no edema.  There is bilateral onychomycosis.   Neuro: sensation is intact to touch on the feet.  Right foot drop.     i reviewed electrocardiogram Lab Results  Component Value Date   WBC 6.7 03/14/2012   HGB 12.5* 03/14/2012   HCT 38.0* 03/14/2012   PLT 179.0 03/14/2012   GLUCOSE 96 03/14/2012   CHOL 151 03/14/2012   TRIG 44.0 03/14/2012   HDL 46.40 03/14/2012   LDLCALC 96 03/14/2012   ALT 20 03/14/2012   AST 28 03/14/2012   NA 140 03/14/2012   K 4.3 03/14/2012   CL 105 03/14/2012   CREATININE 1.2 03/14/2012   BUN 23 03/14/2012   CO2 29 03/14/2012   TSH 3.51 03/14/2012   PSA 0.67 03/02/2009   HGBA1C 5.9 03/14/2012   MICROALBUR 0.6 03/14/2012      Assessment & Plan:  Bradycardia, worse DM, well-controlled Dyslipidemia.  well-controlled    Subjective:   Patient here for Medicare  annual wellness visit and management of other chronic and acute problems.     Risk factors: advanced age    Roster of Physicians Providing Medical Care to Patient:  See "snapshot"   Activities of Daily Living: In your present state of health, do you have any difficulty performing the following activities?:  Preparing food and eating?: No  Bathing yourself: No  Getting dressed: No  Using the toilet:No  Moving around from place to place: No  In the past year have you fallen or had a near fall?: No    Home Safety: Has smoke detector and wears seat belts. No firearms.  Diet and Exercise  Current exercise habits:  Pt says good Dietary issues discussed: pt reports a healthy diet   Depression Screen  Q1: Over the past two weeks, have you felt down, depressed or hopeless? no  Q2: Over the past two weeks, have you felt little interest or pleasure in doing things? no   The following portions of the patient's history were reviewed and updated as appropriate: allergies, current medications, past family history, past medical history, past social history, past surgical history and problem list.  Past Medical History  Diagnosis Date  . Diabetes mellitus   . Hyperlipidemia   . Anemia   . Hypertension   . Allergy   . Pituitary abnormality 2004  . OA (osteoarthritis)   . Elevated PSA   . Hypogonadism male     Past Surgical History  Procedure Date  . Spine surgery   . Joint replacement   . Transphenoidal / transnasal hypophysectomy / resection pituitary tumor 2005  . Electrocardiogram 03/25/2006  . Doppler echocardiography 12/04/2001    History   Social History  . Marital Status: Married    Spouse Name: N/A    Number of Children: N/A  . Years of Education: N/A   Occupational History  . retired    Social History Main Topics  . Smoking status: Former Smoker    Quit date: 02/22/1957  . Smokeless tobacco: Not on file  . Alcohol Use: No  . Drug Use: No  . Sexually Active: Not  Currently   Other Topics Concern  . Not on file   Social History Narrative  . No narrative on file    Current Outpatient Prescriptions on File  Prior to Visit  Medication Sig Dispense Refill  . atorvastatin (LIPITOR) 80 MG tablet Take 1 tablet (80 mg total) by mouth daily.  90 tablet  3  . fexofenadine (ALLEGRA) 180 MG tablet Take 180 mg by mouth daily.        . finasteride (PROSCAR) 5 MG tablet Take 5 mg by mouth daily.        . metFORMIN (GLUCOPHAGE-XR) 500 MG 24 hr tablet TAKE 4 TABLETS ONCE DAILY  360 tablet  2  . pioglitazone (ACTOS) 45 MG tablet TAKE 1 TABLET DAILY  90 tablet  1  . Tamsulosin HCl (FLOMAX) 0.4 MG CAPS Take 0.4 mg by mouth daily.        . Diclofenac Sodium (PENNSAID) 1.5 % SOLN Place 5 drops onto the skin 4 (four) times daily as needed (pain).  150 mL  1  . fludrocortisone (FLORINEF) 0.1 MG tablet Take 1 tablet (0.1 mg total) by mouth daily.  90 tablet  3  . methocarbamol (ROBAXIN) 500 MG tablet take 1 tablet by mouth once daily at bedtime  30 tablet  5  . Multiple Vitamins-Minerals (CENTRUM SILVER PO) Take 1 tablet by mouth daily.        . traMADol (ULTRAM) 50 MG tablet Take 1 tablet (50 mg total) by mouth every 6 (six) hours as needed.  100 tablet  11    No Known Allergies  Family History  Problem Relation Age of Onset  . Cancer Brother     uncertain type   BP 110/72  Pulse 48  Temp(Src) 97.5 F (36.4 C) (Oral)  Ht 6' 3.5" (1.918 m)  Wt 214 lb (97.07 kg)  BMI 26.40 kg/m2  SpO2 98%  Review of Systems  Denies hearing loss, and visual loss Objective:   Vision:  Sees opthalmologist Hearing: grossly normal Body mass index:  See vs page Msk: pt easily and quickly performs "get-up-and-go" from a sitting position Cognitive Impairment Assessment: cognition, memory and judgment appear normal.  remembers 3/3 at 5 minutes.  excellent recall.  can easily read and write a sentence.  alert and oriented x 3   Assessment:   Medicare wellness utd on preventive  parameters    Plan:   During the course of the visit the patient was educated and counseled about appropriate screening and preventive services including:        Fall prevention Diabetes screening  Nutrition counseling   Vaccines / LABS Zostavax / Pnemonccoal Vaccine  today  Patient Instructions (the written plan) was given to the patient.

## 2012-03-14 NOTE — Patient Instructions (Addendum)
Refer back to dr Katrinka Blazing.  you will receive a phone call, about a day and time for an appointment. blood tests are being requested for you today.  You will receive a letter with results. Please come back for a follow-up appointment in 6 months. please consider these measures for your health:  minimize alcohol.  do not use tobacco products.  have a colonoscopy at least every 10 years from age 77.   keep firearms safely stored.  always use seat belts.  have working smoke alarms in your home.  see an eye doctor and dentist regularly.  never drive under the influence of alcohol or drugs (including prescription drugs).   please let me know what your wishes would be, if artificial life support measures should become necessary.  it is critically important to prevent falling down (keep floor areas well-lit, dry, and free of loose objects. Also, try not to rush).

## 2012-03-15 ENCOUNTER — Encounter: Payer: Self-pay | Admitting: Endocrinology

## 2012-03-17 ENCOUNTER — Telehealth: Payer: Self-pay | Admitting: *Deleted

## 2012-03-17 DIAGNOSIS — N401 Enlarged prostate with lower urinary tract symptoms: Secondary | ICD-10-CM | POA: Diagnosis not present

## 2012-03-17 DIAGNOSIS — R972 Elevated prostate specific antigen [PSA]: Secondary | ICD-10-CM | POA: Diagnosis not present

## 2012-03-17 NOTE — Telephone Encounter (Signed)
Called pt to inform of lab results, left message for pt to callback office (letter also mailed to pt). 

## 2012-03-18 NOTE — Telephone Encounter (Signed)
Left message for pt to callback office.  

## 2012-03-19 DIAGNOSIS — J309 Allergic rhinitis, unspecified: Secondary | ICD-10-CM | POA: Diagnosis not present

## 2012-03-19 NOTE — Telephone Encounter (Signed)
Pt informed of lab results via VM and to callback office with any questions/concerns. Letter also mailed to pt. 

## 2012-03-24 DIAGNOSIS — R972 Elevated prostate specific antigen [PSA]: Secondary | ICD-10-CM | POA: Diagnosis not present

## 2012-03-24 DIAGNOSIS — N401 Enlarged prostate with lower urinary tract symptoms: Secondary | ICD-10-CM | POA: Diagnosis not present

## 2012-03-24 DIAGNOSIS — E291 Testicular hypofunction: Secondary | ICD-10-CM | POA: Diagnosis not present

## 2012-03-24 DIAGNOSIS — R39198 Other difficulties with micturition: Secondary | ICD-10-CM | POA: Diagnosis not present

## 2012-04-01 DIAGNOSIS — J309 Allergic rhinitis, unspecified: Secondary | ICD-10-CM | POA: Diagnosis not present

## 2012-04-02 DIAGNOSIS — R972 Elevated prostate specific antigen [PSA]: Secondary | ICD-10-CM | POA: Diagnosis not present

## 2012-04-07 ENCOUNTER — Ambulatory Visit: Payer: Medicare Other | Admitting: Cardiovascular Disease

## 2012-04-10 DIAGNOSIS — J309 Allergic rhinitis, unspecified: Secondary | ICD-10-CM | POA: Diagnosis not present

## 2012-04-16 DIAGNOSIS — J309 Allergic rhinitis, unspecified: Secondary | ICD-10-CM | POA: Diagnosis not present

## 2012-04-23 DIAGNOSIS — J309 Allergic rhinitis, unspecified: Secondary | ICD-10-CM | POA: Diagnosis not present

## 2012-04-30 DIAGNOSIS — E109 Type 1 diabetes mellitus without complications: Secondary | ICD-10-CM | POA: Diagnosis not present

## 2012-04-30 DIAGNOSIS — R55 Syncope and collapse: Secondary | ICD-10-CM | POA: Diagnosis not present

## 2012-04-30 DIAGNOSIS — E785 Hyperlipidemia, unspecified: Secondary | ICD-10-CM | POA: Diagnosis not present

## 2012-04-30 DIAGNOSIS — I959 Hypotension, unspecified: Secondary | ICD-10-CM | POA: Diagnosis not present

## 2012-04-30 DIAGNOSIS — I498 Other specified cardiac arrhythmias: Secondary | ICD-10-CM | POA: Diagnosis not present

## 2012-04-30 DIAGNOSIS — J309 Allergic rhinitis, unspecified: Secondary | ICD-10-CM | POA: Diagnosis not present

## 2012-05-06 DIAGNOSIS — J309 Allergic rhinitis, unspecified: Secondary | ICD-10-CM | POA: Diagnosis not present

## 2012-05-06 DIAGNOSIS — I495 Sick sinus syndrome: Secondary | ICD-10-CM | POA: Diagnosis not present

## 2012-05-07 DIAGNOSIS — I495 Sick sinus syndrome: Secondary | ICD-10-CM | POA: Diagnosis not present

## 2012-05-13 DIAGNOSIS — J309 Allergic rhinitis, unspecified: Secondary | ICD-10-CM | POA: Diagnosis not present

## 2012-05-14 DIAGNOSIS — J309 Allergic rhinitis, unspecified: Secondary | ICD-10-CM | POA: Diagnosis not present

## 2012-05-20 DIAGNOSIS — J309 Allergic rhinitis, unspecified: Secondary | ICD-10-CM | POA: Diagnosis not present

## 2012-05-29 DIAGNOSIS — J309 Allergic rhinitis, unspecified: Secondary | ICD-10-CM | POA: Diagnosis not present

## 2012-06-04 DIAGNOSIS — J309 Allergic rhinitis, unspecified: Secondary | ICD-10-CM | POA: Diagnosis not present

## 2012-06-11 DIAGNOSIS — J309 Allergic rhinitis, unspecified: Secondary | ICD-10-CM | POA: Diagnosis not present

## 2012-06-11 DIAGNOSIS — I959 Hypotension, unspecified: Secondary | ICD-10-CM | POA: Diagnosis not present

## 2012-06-11 DIAGNOSIS — R55 Syncope and collapse: Secondary | ICD-10-CM | POA: Diagnosis not present

## 2012-06-11 DIAGNOSIS — I498 Other specified cardiac arrhythmias: Secondary | ICD-10-CM | POA: Diagnosis not present

## 2012-06-18 DIAGNOSIS — J309 Allergic rhinitis, unspecified: Secondary | ICD-10-CM | POA: Diagnosis not present

## 2012-06-23 ENCOUNTER — Other Ambulatory Visit: Payer: Self-pay | Admitting: Internal Medicine

## 2012-06-24 ENCOUNTER — Other Ambulatory Visit: Payer: Self-pay | Admitting: Endocrinology

## 2012-06-25 DIAGNOSIS — J309 Allergic rhinitis, unspecified: Secondary | ICD-10-CM | POA: Diagnosis not present

## 2012-07-02 DIAGNOSIS — J309 Allergic rhinitis, unspecified: Secondary | ICD-10-CM | POA: Diagnosis not present

## 2012-07-09 DIAGNOSIS — J309 Allergic rhinitis, unspecified: Secondary | ICD-10-CM | POA: Diagnosis not present

## 2012-07-21 ENCOUNTER — Encounter: Payer: Self-pay | Admitting: Endocrinology

## 2012-07-21 ENCOUNTER — Ambulatory Visit (INDEPENDENT_AMBULATORY_CARE_PROVIDER_SITE_OTHER): Payer: Medicare Other | Admitting: Endocrinology

## 2012-07-21 ENCOUNTER — Other Ambulatory Visit (INDEPENDENT_AMBULATORY_CARE_PROVIDER_SITE_OTHER): Payer: Medicare Other

## 2012-07-21 VITALS — BP 136/74 | HR 80 | Temp 97.2°F | Ht 73.0 in | Wt 218.0 lb

## 2012-07-21 DIAGNOSIS — D649 Anemia, unspecified: Secondary | ICD-10-CM

## 2012-07-21 DIAGNOSIS — E291 Testicular hypofunction: Secondary | ICD-10-CM | POA: Diagnosis not present

## 2012-07-21 DIAGNOSIS — R29898 Other symptoms and signs involving the musculoskeletal system: Secondary | ICD-10-CM

## 2012-07-21 DIAGNOSIS — J309 Allergic rhinitis, unspecified: Secondary | ICD-10-CM | POA: Diagnosis not present

## 2012-07-21 LAB — CBC WITH DIFFERENTIAL/PLATELET
Basophils Relative: 0.5 % (ref 0.0–3.0)
Eosinophils Absolute: 0.2 10*3/uL (ref 0.0–0.7)
Eosinophils Relative: 3.3 % (ref 0.0–5.0)
HCT: 33.6 % — ABNORMAL LOW (ref 39.0–52.0)
Hemoglobin: 10.9 g/dL — ABNORMAL LOW (ref 13.0–17.0)
Lymphs Abs: 2 10*3/uL (ref 0.7–4.0)
MCHC: 32.5 g/dL (ref 30.0–36.0)
MCV: 94.2 fl (ref 78.0–100.0)
Monocytes Absolute: 0.6 10*3/uL (ref 0.1–1.0)
Neutro Abs: 2.7 10*3/uL (ref 1.4–7.7)
RBC: 3.57 Mil/uL — ABNORMAL LOW (ref 4.22–5.81)
WBC: 5.6 10*3/uL (ref 4.5–10.5)

## 2012-07-21 LAB — IBC PANEL: Transferrin: 188.3 mg/dL — ABNORMAL LOW (ref 212.0–360.0)

## 2012-07-21 LAB — TESTOSTERONE: Testosterone: 218.25 ng/dL — ABNORMAL LOW (ref 350.00–890.00)

## 2012-07-21 NOTE — Progress Notes (Signed)
Subjective:    Patient ID: Joseph Rocha, male    DOB: August 28, 1935, 77 y.o.   MRN: [id number]  HPI Pt was on a cruise ship last week.  While there, he had 1 week of moderate swelling at the right mandibular area, but no assoc numbness.  He says sxs are resolved.   Past Medical History  Diagnosis Date  . Diabetes mellitus   . Hyperlipidemia   . Anemia   . Hypertension   . Allergy   . Pituitary abnormality 2004  . OA (osteoarthritis)   . Elevated PSA   . Hypogonadism male     Past Surgical History  Procedure Date  . Spine surgery   . Joint replacement   . Transphenoidal / transnasal hypophysectomy / resection pituitary tumor 2005  . Electrocardiogram 03/25/2006  . Doppler echocardiography 12/04/2001    History   Social History  . Marital Status: Married    Spouse Name: N/A    Number of Children: N/A  . Years of Education: N/A   Occupational History  . retired    Social History Main Topics  . Smoking status: Former Smoker    Quit date: 02/22/1957  . Smokeless tobacco: Not on file  . Alcohol Use: No  . Drug Use: No  . Sexually Active: Not Currently   Other Topics Concern  . Not on file   Social History Narrative  . No narrative on file    Current Outpatient Prescriptions on File Prior to Visit  Medication Sig Dispense Refill  . atorvastatin (LIPITOR) 80 MG tablet Take 1 tablet (80 mg total) by mouth daily.  90 tablet  3  . calcium carbonate (OS-CAL) 600 MG TABS Take 600 mg by mouth daily.      . carbonyl iron ([facility] IRON) 45 MG TABS Take 45 mg by mouth daily.      . fexofenadine (ALLEGRA) 180 MG tablet Take 180 mg by mouth daily.        . finasteride (PROSCAR) 5 MG tablet Take 5 mg by mouth daily.        . fludrocortisone (FLORINEF) 0.1 MG tablet TAKE 1 TABLET DAILY  90 tablet  3  . metFORMIN (GLUCOPHAGE-XR) 500 MG 24 hr tablet TAKE 4 TABLETS ONCE DAILY  360 tablet  2  . methocarbamol (ROBAXIN) 500 MG tablet take 1 tablet by mouth once daily at bedtime  30  tablet  5  . Multiple Vitamins-Minerals (CENTRUM SILVER PO) Take 1 tablet by mouth daily.        . pioglitazone (ACTOS) 45 MG tablet TAKE 1 TABLET DAILY  90 tablet  2  . Tamsulosin HCl (FLOMAX) 0.4 MG CAPS Take 0.4 mg by mouth daily.        Marland Kitchen testosterone (ANDROGEL) 50 MG/5GM GEL Place 5 g onto the skin daily.      . traMADol (ULTRAM) 50 MG tablet Take 1 tablet (50 mg total) by mouth every 6 (six) hours as needed.  100 tablet  11  . Diclofenac Sodium (PENNSAID) 1.5 % SOLN Place 5 drops onto the skin 4 (four) times daily as needed (pain).  150 mL  1    No Known Allergies  Family History  Problem Relation Age of Onset  . Cancer Brother     uncertain type    BP 136/74  Pulse 80  Temp 97.2 F (36.2 C) (Oral)  Ht 6\' 1"  (1.854 m)  Wt 218 lb (98.884 kg)  BMI 28.76 kg/m2  SpO2 96%  Review of Systems He has pain at the tongue, due to biting it.  No fever or sob.  He says his legs and thighs are slightly weak, but not painful.      Objective:   Physical Exam VITAL SIGNS:  See vs page GENERAL: no distress Face: no swelling head: no deformity eyes: no periorbital swelling, no proptosis external nose and ears are normal mouth: no lesion seen Tongue:  1 cm ulcer, traumatic    Lab Results  Component Value Date   WBC 5.6 07/21/2012   HGB 10.9* 07/21/2012   HCT 33.6* 07/21/2012   PLT 166.0 07/21/2012   GLUCOSE 96 03/14/2012   CHOL 151 03/14/2012   TRIG 44.0 03/14/2012   HDL 46.40 03/14/2012   LDLCALC 96 03/14/2012   ALT 20 03/14/2012   AST 28 03/14/2012   NA 140 03/14/2012   K 4.3 03/14/2012   CL 105 03/14/2012   CREATININE 1.2 03/14/2012   BUN 23 03/14/2012   CO2 29 03/14/2012   TSH 3.51 03/14/2012   PSA 0.67 03/02/2009   HGBA1C 5.9 03/14/2012   MICROALBUR 0.6 03/14/2012      Assessment & Plan:  Jaw swelling, uncertain etiology, resolved Traumatic ulcer of the tongue, new Anemia, mild Leg weakness, new, uncertain etiology

## 2012-07-21 NOTE — Patient Instructions (Addendum)
Please call dr Irena Cords if the facial symptoms recur.   blood tests are being requested for you today.  You will receive a letter with results.

## 2012-07-24 ENCOUNTER — Telehealth: Payer: Self-pay | Admitting: *Deleted

## 2012-07-24 NOTE — Telephone Encounter (Signed)
ok 

## 2012-07-24 NOTE — Telephone Encounter (Signed)
Called pt to inform of lab results, pt informed (letter also mailed to pt). Pt wanted to know if Dr. Venetia Maxon could do the additional testing regarding results of [facility] lab work.

## 2012-07-25 NOTE — Telephone Encounter (Signed)
Pt wife notified per MD

## 2012-07-30 DIAGNOSIS — J309 Allergic rhinitis, unspecified: Secondary | ICD-10-CM | POA: Diagnosis not present

## 2012-08-06 DIAGNOSIS — J309 Allergic rhinitis, unspecified: Secondary | ICD-10-CM | POA: Diagnosis not present

## 2012-08-13 DIAGNOSIS — J309 Allergic rhinitis, unspecified: Secondary | ICD-10-CM | POA: Diagnosis not present

## 2012-08-18 DIAGNOSIS — M47817 Spondylosis without myelopathy or radiculopathy, lumbosacral region: Secondary | ICD-10-CM | POA: Diagnosis not present

## 2012-08-20 DIAGNOSIS — J309 Allergic rhinitis, unspecified: Secondary | ICD-10-CM | POA: Diagnosis not present

## 2012-08-27 DIAGNOSIS — J309 Allergic rhinitis, unspecified: Secondary | ICD-10-CM | POA: Diagnosis not present

## 2012-09-03 DIAGNOSIS — J309 Allergic rhinitis, unspecified: Secondary | ICD-10-CM | POA: Diagnosis not present

## 2012-09-10 DIAGNOSIS — J309 Allergic rhinitis, unspecified: Secondary | ICD-10-CM | POA: Diagnosis not present

## 2012-09-12 ENCOUNTER — Ambulatory Visit: Payer: Medicare Other | Admitting: Endocrinology

## 2012-09-17 DIAGNOSIS — J309 Allergic rhinitis, unspecified: Secondary | ICD-10-CM | POA: Diagnosis not present

## 2012-09-24 DIAGNOSIS — J309 Allergic rhinitis, unspecified: Secondary | ICD-10-CM | POA: Diagnosis not present

## 2012-09-30 DIAGNOSIS — J309 Allergic rhinitis, unspecified: Secondary | ICD-10-CM | POA: Diagnosis not present

## 2012-10-08 DIAGNOSIS — J309 Allergic rhinitis, unspecified: Secondary | ICD-10-CM | POA: Diagnosis not present

## 2012-10-15 DIAGNOSIS — J309 Allergic rhinitis, unspecified: Secondary | ICD-10-CM | POA: Diagnosis not present

## 2012-10-16 ENCOUNTER — Other Ambulatory Visit: Payer: Self-pay | Admitting: Endocrinology

## 2012-10-22 DIAGNOSIS — J309 Allergic rhinitis, unspecified: Secondary | ICD-10-CM | POA: Diagnosis not present

## 2012-10-30 DIAGNOSIS — J309 Allergic rhinitis, unspecified: Secondary | ICD-10-CM | POA: Diagnosis not present

## 2012-11-07 DIAGNOSIS — J309 Allergic rhinitis, unspecified: Secondary | ICD-10-CM | POA: Diagnosis not present

## 2012-11-10 DIAGNOSIS — J309 Allergic rhinitis, unspecified: Secondary | ICD-10-CM | POA: Diagnosis not present

## 2012-11-12 DIAGNOSIS — J309 Allergic rhinitis, unspecified: Secondary | ICD-10-CM | POA: Diagnosis not present

## 2012-11-19 DIAGNOSIS — J309 Allergic rhinitis, unspecified: Secondary | ICD-10-CM | POA: Diagnosis not present

## 2012-11-27 DIAGNOSIS — J309 Allergic rhinitis, unspecified: Secondary | ICD-10-CM | POA: Diagnosis not present

## 2012-12-02 DIAGNOSIS — J309 Allergic rhinitis, unspecified: Secondary | ICD-10-CM | POA: Diagnosis not present

## 2012-12-10 DIAGNOSIS — E109 Type 1 diabetes mellitus without complications: Secondary | ICD-10-CM | POA: Diagnosis not present

## 2012-12-10 DIAGNOSIS — I495 Sick sinus syndrome: Secondary | ICD-10-CM | POA: Diagnosis not present

## 2012-12-10 DIAGNOSIS — R55 Syncope and collapse: Secondary | ICD-10-CM | POA: Diagnosis not present

## 2012-12-10 DIAGNOSIS — J309 Allergic rhinitis, unspecified: Secondary | ICD-10-CM | POA: Diagnosis not present

## 2012-12-10 DIAGNOSIS — I959 Hypotension, unspecified: Secondary | ICD-10-CM | POA: Diagnosis not present

## 2012-12-10 DIAGNOSIS — R42 Dizziness and giddiness: Secondary | ICD-10-CM | POA: Diagnosis not present

## 2012-12-16 DIAGNOSIS — J309 Allergic rhinitis, unspecified: Secondary | ICD-10-CM | POA: Diagnosis not present

## 2012-12-24 DIAGNOSIS — J309 Allergic rhinitis, unspecified: Secondary | ICD-10-CM | POA: Diagnosis not present

## 2012-12-25 ENCOUNTER — Telehealth: Payer: Self-pay | Admitting: *Deleted

## 2012-12-25 ENCOUNTER — Other Ambulatory Visit: Payer: Self-pay | Admitting: Endocrinology

## 2012-12-25 MED ORDER — METFORMIN HCL ER 500 MG PO TB24: ORAL_TABLET | ORAL | Status: DC

## 2012-12-25 MED ORDER — FOLIC ACID 1 MG PO TABS: 1.0000 mg | ORAL_TABLET | Freq: Every day | ORAL | Status: DC

## 2012-12-25 NOTE — Telephone Encounter (Signed)
Please advise. Medication that is being requested from patient and [facility] are not on patient med list. Faxed forms are on your desk.

## 2012-12-29 ENCOUNTER — Encounter: Payer: Self-pay | Admitting: Endocrinology

## 2012-12-29 ENCOUNTER — Ambulatory Visit (INDEPENDENT_AMBULATORY_CARE_PROVIDER_SITE_OTHER): Payer: Medicare Other | Admitting: Endocrinology

## 2012-12-29 VITALS — BP 122/72 | HR 90 | Wt 225.0 lb

## 2012-12-29 DIAGNOSIS — J069 Acute upper respiratory infection, unspecified: Secondary | ICD-10-CM

## 2012-12-29 MED ORDER — CEFUROXIME AXETIL 250 MG PO TABS: 250.0000 mg | ORAL_TABLET | Freq: Two times a day (BID) | ORAL | Status: DC

## 2012-12-29 MED ORDER — HYDROCODONE-HOMATROPINE 5-1.5 MG/5ML PO SYRP: 5.0000 mL | ORAL_SOLUTION | Freq: Four times a day (QID) | ORAL | Status: DC | PRN

## 2012-12-29 MED ORDER — TRAMADOL HCL 50 MG PO TABS: 50.0000 mg | ORAL_TABLET | Freq: Four times a day (QID) | ORAL | Status: DC | PRN

## 2012-12-29 NOTE — Progress Notes (Signed)
Subjective:    Patient ID: Joseph Rocha, male    DOB: 18-Apr-1935, 78 y.o.   MRN: [id number]  HPI Pt states 1 week of prod-quality cough in the chest, and assoc nasal congestion.   Past Medical History  Diagnosis Date  . Diabetes mellitus   . Hyperlipidemia   . Anemia   . Hypertension   . Allergy   . Pituitary abnormality 2004  . OA (osteoarthritis)   . Elevated PSA   . Hypogonadism male     Past Surgical History  Procedure Laterality Date  . Spine surgery    . Joint replacement    . Transphenoidal / transnasal hypophysectomy / resection pituitary tumor  2005  . Electrocardiogram  03/25/2006  . Doppler echocardiography  12/04/2001    History   Social History  . Marital Status: Married    Spouse Name: N/A    Number of Children: N/A  . Years of Education: N/A   Occupational History  . retired    Social History Main Topics  . Smoking status: Former Smoker    Quit date: 02/22/1957  . Smokeless tobacco: Not on file  . Alcohol Use: No  . Drug Use: No  . Sexually Active: Not Currently   Other Topics Concern  . Not on file   Social History Narrative  . No narrative on file    Current Outpatient Prescriptions on File Prior to Visit  Medication Sig Dispense Refill  . aspirin 81 MG tablet Take 81 mg by mouth daily.      Marland Kitchen atorvastatin (LIPITOR) 80 MG tablet TAKE 1 TABLET DAILY  90 tablet  3  . calcium carbonate (OS-CAL) 600 MG TABS Take 600 mg by mouth daily.      . carbonyl iron ([facility]ON) 45 MG TABS Take 45 mg by mouth daily.      . Cholecalciferol (VITAMIN D3 PO) Take 1 capsule by mouth daily.      . Diclofenac Sodium (PENNSAID) 1.5 % SOLN Place 5 drops onto the skin 4 (four) times daily as needed (pain).  150 mL  1  . fexofenadine (ALLEGRA) 180 MG tablet Take 180 mg by mouth daily.        . finasteride (PROSCAR) 5 MG tablet Take 5 mg by mouth daily.        . fish oil-omega-3 fatty acids 1000 MG capsule Take 1 g by mouth daily.      . fludrocortisone  (FLORINEF) 0.1 MG tablet TAKE 1 TABLET DAILY  90 tablet  3  . folic acid (FOLVITE) 1 MG tablet Take 1 tablet (1 mg total) by mouth daily.  90 tablet  3  . metFORMIN (GLUCOPHAGE-XR) 500 MG 24 hr tablet TAKE 4 TABLETS ONCE DAILY  360 tablet  3  . methocarbamol (ROBAXIN) 500 MG tablet take 1 tablet by mouth once daily at bedtime  30 tablet  5  . Multiple Vitamins-Minerals (CENTRUM SILVER PO) Take 1 tablet by mouth daily.        . pioglitazone (ACTOS) 45 MG tablet TAKE 1 TABLET DAILY  90 tablet  2  . Tamsulosin HCl (FLOMAX) 0.4 MG CAPS Take 0.4 mg by mouth daily.        Marland Kitchen testosterone (ANDROGEL) 50 MG/5GM GEL Place 5 g onto the skin daily.       No current facility-administered medications on file prior to visit.    No Known Allergies  Family History  Problem Relation Age of Onset  . Cancer Brother  uncertain type    BP 122/72  Pulse 90  Wt 225 lb (102.059 kg)  BMI 29.69 kg/m2  SpO2 96%  Review of Systems Denies fever and earache.      Objective:   Physical Exam VITAL SIGNS:  See vs page GENERAL: no distress head: no deformity eyes: no periorbital swelling, no proptosis external nose and ears are normal mouth: no lesion seen Both eac's and tm's are normal LUNGS:  Clear to auscultation.      Assessment & Plan:  URI, new

## 2012-12-29 NOTE — Patient Instructions (Addendum)
Please come in for a "medicare wellness" visit after 03/14/13.   i have sent 2 prescriptions to your pharmacy: antibiotic and cough. Loratadine-d (non-prescription) will help your congestion.  I hope you feel better soon.

## 2012-12-30 ENCOUNTER — Telehealth: Payer: Self-pay | Admitting: *Deleted

## 2012-12-30 ENCOUNTER — Telehealth: Payer: Self-pay | Admitting: Endocrinology

## 2012-12-30 MED ORDER — CEFUROXIME AXETIL 250 MG PO TABS: 250.0000 mg | ORAL_TABLET | Freq: Two times a day (BID) | ORAL | Status: AC

## 2012-12-30 NOTE — Telephone Encounter (Signed)
Left message on v-mail for pt

## 2012-12-30 NOTE — Telephone Encounter (Signed)
Pt called back stating he did not get rx loratadine-D pt states he needs rx sent to [facility] [place]

## 2012-12-30 NOTE — Telephone Encounter (Signed)
Please call patient ASAP about medication, he has called multiple times and wants a call back ASAP!!!!! CB# [phone] / Roanna Raider

## 2012-12-30 NOTE — Telephone Encounter (Signed)
i have resent the prescription to your pharmacy. The other prescription was the cough syrup, which was a written prescription.

## 2012-12-30 NOTE — Telephone Encounter (Signed)
Pt advised rx sent to pharmacy. 

## 2012-12-30 NOTE — Telephone Encounter (Signed)
i'm sorry--i forgot to mention it is a non-prescription med

## 2012-12-30 NOTE — Telephone Encounter (Signed)
Pt called regarding 2 rx's that he was suppose to receive at yesterday's appointment. He states that pharmacy never received them. Pt is requesting a callback.

## 2012-12-30 NOTE — Telephone Encounter (Signed)
See previous message

## 2013-01-01 DIAGNOSIS — J309 Allergic rhinitis, unspecified: Secondary | ICD-10-CM | POA: Diagnosis not present

## 2013-01-14 DIAGNOSIS — J309 Allergic rhinitis, unspecified: Secondary | ICD-10-CM | POA: Diagnosis not present

## 2013-01-21 DIAGNOSIS — J309 Allergic rhinitis, unspecified: Secondary | ICD-10-CM | POA: Diagnosis not present

## 2013-01-28 DIAGNOSIS — J309 Allergic rhinitis, unspecified: Secondary | ICD-10-CM | POA: Diagnosis not present

## 2013-02-04 DIAGNOSIS — J309 Allergic rhinitis, unspecified: Secondary | ICD-10-CM | POA: Diagnosis not present

## 2013-02-11 DIAGNOSIS — J309 Allergic rhinitis, unspecified: Secondary | ICD-10-CM | POA: Diagnosis not present

## 2013-02-18 DIAGNOSIS — J310 Chronic rhinitis: Secondary | ICD-10-CM | POA: Diagnosis not present

## 2013-02-18 DIAGNOSIS — H6120 Impacted cerumen, unspecified ear: Secondary | ICD-10-CM | POA: Diagnosis not present

## 2013-02-18 DIAGNOSIS — J309 Allergic rhinitis, unspecified: Secondary | ICD-10-CM | POA: Diagnosis not present

## 2013-02-18 DIAGNOSIS — H902 Conductive hearing loss, unspecified: Secondary | ICD-10-CM | POA: Diagnosis not present

## 2013-02-25 DIAGNOSIS — J309 Allergic rhinitis, unspecified: Secondary | ICD-10-CM | POA: Diagnosis not present

## 2013-02-25 DIAGNOSIS — M47817 Spondylosis without myelopathy or radiculopathy, lumbosacral region: Secondary | ICD-10-CM | POA: Diagnosis not present

## 2013-02-27 ENCOUNTER — Other Ambulatory Visit: Payer: Self-pay | Admitting: Endocrinology

## 2013-03-05 ENCOUNTER — Ambulatory Visit (INDEPENDENT_AMBULATORY_CARE_PROVIDER_SITE_OTHER): Payer: Medicare Other | Admitting: Endocrinology

## 2013-03-05 ENCOUNTER — Encounter: Payer: Self-pay | Admitting: Endocrinology

## 2013-03-05 ENCOUNTER — Telehealth: Payer: Self-pay | Admitting: Endocrinology

## 2013-03-05 ENCOUNTER — Ambulatory Visit
Admission: RE | Admit: 2013-03-05 | Discharge: 2013-03-05 | Disposition: A | Discharge status: Home / Self Care | Payer: Medicare Other | Source: Ambulatory Visit | Attending: Endocrinology | Admitting: Endocrinology

## 2013-03-05 VITALS — BP 126/70 | HR 58 | Wt 221.0 lb

## 2013-03-05 DIAGNOSIS — R0989 Other specified symptoms and signs involving the circulatory and respiratory systems: Secondary | ICD-10-CM | POA: Diagnosis not present

## 2013-03-05 DIAGNOSIS — R05 Cough: Secondary | ICD-10-CM

## 2013-03-05 MED ORDER — FLUTICASONE-SALMETEROL 100-50 MCG/DOSE IN AEPB: 1.0000 | INHALATION_SPRAY | Freq: Two times a day (BID) | RESPIRATORY_TRACT | Status: DC | PRN

## 2013-03-05 MED ORDER — PROMETHAZINE-CODEINE 6.25-10 MG/5ML PO SYRP: 5.0000 mL | ORAL_SOLUTION | ORAL | Status: DC | PRN

## 2013-03-05 MED ORDER — CEFUROXIME AXETIL 250 MG PO TABS: 250.0000 mg | ORAL_TABLET | Freq: Two times a day (BID) | ORAL | Status: AC

## 2013-03-05 NOTE — Patient Instructions (Addendum)
Please come in for a "medicare wellness" visit after 03/14/13.  A chest-x-ray is requested for you today.  We'll contact you with results. Here are 2 prescriptions: for an antibiotic pill and cough syrup.  Loratadine-d (non-prescription) will help your congestion.   I hope you feel better soon.  If you don't feel better by next week, please call back.  Please call sooner if you get worse.

## 2013-03-05 NOTE — Progress Notes (Signed)
Subjective:    Patient ID: Joseph Rocha, male    DOB: 30-Jun-1935, 78 y.o.   MRN: [id number]  HPI Pt states 2 days of moderate prod-quality cough in the chest, and assoc myalgias.  Past Medical History  Diagnosis Date  . Diabetes mellitus   . Hyperlipidemia   . Anemia   . Hypertension   . Allergy   . Pituitary abnormality 2004  . OA (osteoarthritis)   . Elevated PSA   . Hypogonadism male     Past Surgical History  Procedure Laterality Date  . Spine surgery    . Joint replacement    . Transphenoidal / transnasal hypophysectomy / resection pituitary tumor  2005  . Electrocardiogram  03/25/2006  . Doppler echocardiography  12/04/2001    History   Social History  . Marital Status: Married    Spouse Name: N/A    Number of Children: N/A  . Years of Education: N/A   Occupational History  . retired    Social History Main Topics  . Smoking status: Former Smoker    Quit date: 02/22/1957  . Smokeless tobacco: Not on file  . Alcohol Use: No  . Drug Use: No  . Sexually Active: Not Currently   Other Topics Concern  . Not on file   Social History Narrative  . No narrative on file    Current Outpatient Prescriptions on File Prior to Visit  Medication Sig Dispense Refill  . aspirin 81 MG tablet Take 81 mg by mouth daily.      Marland Kitchen atorvastatin (LIPITOR) 80 MG tablet TAKE 1 TABLET DAILY  90 tablet  3  . calcium carbonate (OS-CAL) 600 MG TABS Take 600 mg by mouth daily.      . carbonyl iron ([facility] IRON) 45 MG TABS Take 45 mg by mouth daily.      . Cholecalciferol (VITAMIN D3 PO) Take 1 capsule by mouth daily.      . Diclofenac Sodium (PENNSAID) 1.5 % SOLN Place 5 drops onto the skin 4 (four) times daily as needed (pain).  150 mL  1  . finasteride (PROSCAR) 5 MG tablet Take 5 mg by mouth daily.        . fish oil-omega-3 fatty acids 1000 MG capsule Take 1 g by mouth daily.      . fludrocortisone (FLORINEF) 0.1 MG tablet TAKE 1 TABLET DAILY  90 tablet  3  . folic acid  (FOLVITE) 1 MG tablet Take 1 tablet (1 mg total) by mouth daily.  90 tablet  3  . metFORMIN (GLUCOPHAGE-XR) 500 MG 24 hr tablet TAKE 4 TABLETS ONCE DAILY  360 tablet  3  . methocarbamol (ROBAXIN) 500 MG tablet take 1 tablet by mouth once daily at bedtime  30 tablet  5  . Multiple Vitamins-Minerals (CENTRUM SILVER PO) Take 1 tablet by mouth daily.        . pioglitazone (ACTOS) 45 MG tablet TAKE 1 TABLET DAILY  90 tablet  2  . Tamsulosin HCl (FLOMAX) 0.4 MG CAPS Take 0.4 mg by mouth daily.        Marland Kitchen testosterone (ANDROGEL) 50 MG/5GM GEL Place 5 g onto the skin daily.      . traMADol (ULTRAM) 50 MG tablet Take 1 tablet (50 mg total) by mouth every 6 (six) hours as needed (fou cough).  50 tablet  11   No current facility-administered medications on file prior to visit.    No Known Allergies  Family History  Problem  Relation Age of Onset  . Cancer Brother     uncertain type    BP 126/70  Pulse 58  Wt 221 lb (100.245 kg)  BMI 29.16 kg/m2  SpO2 97%  Review of Systems He had 1 episode of n/v, sore throat, wheezing, and low-grade fever.      Objective:   Physical Exam VITAL SIGNS:  See vs page GENERAL: no distress head: no deformity eyes: no periorbital swelling, no proptosis external nose and ears are normal. mouth: no lesion seen. Both tm's are slightly red. Neck: supple.   LUNGS:  Clear to auscultation.     CXR:NAD    Assessment & Plan:  Acute bronchitis, new

## 2013-03-05 NOTE — Telephone Encounter (Signed)
Patient coughing up yellow phlegm. Asking to be "squeezed" in today. Please contact patient.

## 2013-03-05 NOTE — Telephone Encounter (Signed)
Ok, come in now

## 2013-03-05 NOTE — Telephone Encounter (Signed)
Patient has OV scheduled

## 2013-03-11 DIAGNOSIS — J309 Allergic rhinitis, unspecified: Secondary | ICD-10-CM | POA: Diagnosis not present

## 2013-03-15 ENCOUNTER — Emergency Department (HOSPITAL_COMMUNITY): Payer: Medicare Other

## 2013-03-15 ENCOUNTER — Emergency Department (HOSPITAL_COMMUNITY)
Admission: EM | Admit: 2013-03-15 | Discharge: 2013-03-15 | Disposition: A | Discharge status: Home / Self Care | Payer: Medicare Other | Attending: Emergency Medicine | Admitting: Emergency Medicine

## 2013-03-15 ENCOUNTER — Other Ambulatory Visit: Payer: Self-pay

## 2013-03-15 DIAGNOSIS — E785 Hyperlipidemia, unspecified: Secondary | ICD-10-CM | POA: Diagnosis not present

## 2013-03-15 DIAGNOSIS — R404 Transient alteration of awareness: Secondary | ICD-10-CM | POA: Diagnosis not present

## 2013-03-15 DIAGNOSIS — Z8739 Personal history of other diseases of the musculoskeletal system and connective tissue: Secondary | ICD-10-CM | POA: Insufficient documentation

## 2013-03-15 DIAGNOSIS — R05 Cough: Secondary | ICD-10-CM | POA: Diagnosis not present

## 2013-03-15 DIAGNOSIS — Z87891 Personal history of nicotine dependence: Secondary | ICD-10-CM | POA: Diagnosis not present

## 2013-03-15 DIAGNOSIS — Z79899 Other long term (current) drug therapy: Secondary | ICD-10-CM | POA: Diagnosis not present

## 2013-03-15 DIAGNOSIS — E876 Hypokalemia: Secondary | ICD-10-CM | POA: Insufficient documentation

## 2013-03-15 DIAGNOSIS — R42 Dizziness and giddiness: Secondary | ICD-10-CM | POA: Diagnosis not present

## 2013-03-15 DIAGNOSIS — E119 Type 2 diabetes mellitus without complications: Secondary | ICD-10-CM | POA: Insufficient documentation

## 2013-03-15 DIAGNOSIS — Z7982 Long term (current) use of aspirin: Secondary | ICD-10-CM | POA: Diagnosis not present

## 2013-03-15 DIAGNOSIS — Z862 Personal history of diseases of the blood and blood-forming organs and certain disorders involving the immune mechanism: Secondary | ICD-10-CM | POA: Diagnosis not present

## 2013-03-15 DIAGNOSIS — K5289 Other specified noninfective gastroenteritis and colitis: Secondary | ICD-10-CM | POA: Diagnosis not present

## 2013-03-15 DIAGNOSIS — K529 Noninfective gastroenteritis and colitis, unspecified: Secondary | ICD-10-CM

## 2013-03-15 DIAGNOSIS — Z8639 Personal history of other endocrine, nutritional and metabolic disease: Secondary | ICD-10-CM | POA: Insufficient documentation

## 2013-03-15 DIAGNOSIS — R112 Nausea with vomiting, unspecified: Secondary | ICD-10-CM | POA: Insufficient documentation

## 2013-03-15 DIAGNOSIS — R0602 Shortness of breath: Secondary | ICD-10-CM | POA: Diagnosis not present

## 2013-03-15 LAB — COMPREHENSIVE METABOLIC PANEL
ALT: 31 U/L (ref 0–53)
AST: 39 U/L — ABNORMAL HIGH (ref 0–37)
CO2: 26 mEq/L (ref 19–32)
Chloride: 97 mEq/L (ref 96–112)
GFR calc non Af Amer: 60 mL/min — ABNORMAL LOW (ref >90)
Potassium: 3.1 mEq/L — ABNORMAL LOW (ref 3.5–5.1)
Sodium: 134 mEq/L — ABNORMAL LOW (ref 135–145)
Total Bilirubin: 0.5 mg/dL (ref 0.3–1.2)

## 2013-03-15 LAB — CBC WITH DIFFERENTIAL/PLATELET
Basophils Absolute: 0 10*3/uL (ref 0.0–0.1)
HCT: 33.3 % — ABNORMAL LOW (ref 39.0–52.0)
Lymphocytes Relative: 29 % (ref 12–46)
Monocytes Absolute: 0.9 10*3/uL (ref 0.1–1.0)
Neutro Abs: 3.9 10*3/uL (ref 1.7–7.7)
Neutrophils Relative %: 57 % (ref 43–77)
Platelets: 224 10*3/uL (ref 150–400)
RDW: 14.7 % (ref 11.5–15.5)
WBC: 6.9 10*3/uL (ref 4.0–10.5)

## 2013-03-15 MED ORDER — POTASSIUM CHLORIDE 20 MEQ/15ML (10%) PO LIQD
20.0000 meq | Freq: Once | ORAL | Status: AC
  Administered 2013-03-15: 20 meq via ORAL
  Filled 2013-03-15: qty 15

## 2013-03-15 MED ORDER — PANTOPRAZOLE SODIUM 20 MG PO TBEC: 20.0000 mg | DELAYED_RELEASE_TABLET | Freq: Every day | ORAL | Status: DC

## 2013-03-15 MED ORDER — ONDANSETRON HCL 8 MG PO TABS: 8.0000 mg | ORAL_TABLET | ORAL | Status: DC | PRN

## 2013-03-15 MED ORDER — ONDANSETRON HCL 4 MG/2ML IJ SOLN: 4.0000 mg | Freq: Once | INTRAMUSCULAR | Status: DC

## 2013-03-15 MED ORDER — SODIUM CHLORIDE 0.9 % IV SOLN
INTRAVENOUS | Status: DC
  Administered 2013-03-15 (×2): via INTRAVENOUS

## 2013-03-15 MED ORDER — POTASSIUM CHLORIDE CRYS ER 20 MEQ PO TBCR: 20.0000 meq | EXTENDED_RELEASE_TABLET | Freq: Every day | ORAL | Status: DC

## 2013-03-15 MED ORDER — SODIUM CHLORIDE 0.9 % IV BOLUS (SEPSIS)
1000.0000 mL | Freq: Once | INTRAVENOUS | Status: AC
  Administered 2013-03-15: 1000 mL via INTRAVENOUS

## 2013-03-15 NOTE — ED Notes (Signed)
Inaccurate validated data at 1100

## 2013-03-15 NOTE — ED Notes (Addendum)
MD Adriana Simas in to eval. NS bolus being given. Patient complaining of SOB and not feeling well.

## 2013-03-15 NOTE — ED Notes (Signed)
After receiving 1L of NS patient states that he is feeling much better.

## 2013-03-15 NOTE — ED Notes (Signed)
Inaccurate validated data at 1200

## 2013-03-15 NOTE — ED Notes (Signed)
A complaints of nausea, gave emesis bag . Will monitor

## 2013-03-15 NOTE — ED Notes (Signed)
[id number]<JY> Expected date:03/15/13<BR> Expected time: 6:20 AM<BR> Means of arrival:Ambulance<BR> Comments:<BR> 78 yo M weakness, near syncope

## 2013-03-15 NOTE — ED Provider Notes (Signed)
History     CSN: [id number]  Arrival date & time 03/15/13  4098   First MD Initiated Contact with Patient 03/15/13 [phone]      No chief complaint on file.   (Consider location/radiation/quality/duration/timing/severity/associated sxs/prior treatment) HPI... nausea and vomiting for several days.   This morning he felt dizzy and almost passed out.   Decreased eating and fluid intake past several days.  Severity is moderate. Nothing makes symptoms better or worse. No chest pain, dyspnea, neuro deficits, dysuria, cough  Past Medical History  Diagnosis Date  . Diabetes mellitus   . Hyperlipidemia   . Anemia   . Hypertension   . Allergy   . Pituitary abnormality 2004  . OA (osteoarthritis)   . Elevated PSA   . Hypogonadism male     Past Surgical History  Procedure Laterality Date  . Spine surgery    . Joint replacement    . Transphenoidal / transnasal hypophysectomy / resection pituitary tumor  2005  . Electrocardiogram  03/25/2006  . Doppler echocardiography  12/04/2001    Family History  Problem Relation Age of Onset  . Cancer Brother     uncertain type    History  Substance Use Topics  . Smoking status: Former Smoker    Quit date: 02/22/1957  . Smokeless tobacco: Not on file  . Alcohol Use: No      Review of Systems  All other systems reviewed and are negative.    Allergies  Review of patient's allergies indicates no known allergies.  Home Medications   Current Outpatient Rx  Name  Route  Sig  Dispense  Refill  . aspirin 81 MG tablet   Oral   Take 81 mg by mouth daily.         Marland Kitchen atorvastatin (LIPITOR) 80 MG tablet      TAKE 1 TABLET DAILY   90 tablet   3   . calcium carbonate (OS-CAL) 600 MG TABS   Oral   Take 600 mg by mouth daily.         . carbonyl iron ([facility] IRON) 45 MG TABS   Oral   Take 45 mg by mouth daily.         . cefUROXime (CEFTIN) 250 MG tablet   Oral   Take 1 tablet (250 mg total) by mouth 2 (two) times daily.   14  tablet   0   . cetirizine (ZYRTEC) 10 MG tablet   Oral   Take 10 mg by mouth daily.         . Cholecalciferol (VITAMIN D3 PO)   Oral   Take 1 capsule by mouth daily.         . Diclofenac Sodium (PENNSAID) 1.5 % SOLN   Transdermal   Place 5 drops onto the skin 4 (four) times daily as needed (pain).   150 mL   1   . finasteride (PROSCAR) 5 MG tablet   Oral   Take 5 mg by mouth daily.           . fish oil-omega-3 fatty acids 1000 MG capsule   Oral   Take 1 g by mouth daily.         . fludrocortisone (FLORINEF) 0.1 MG tablet      TAKE 1 TABLET DAILY   90 tablet   3   . Fluticasone-Salmeterol (ADVAIR DISKUS) 100-50 MCG/DOSE AEPB   Inhalation   Inhale 1 puff into the lungs 2 (two) times daily as  needed (for wheezing).   1 each   0   . folic acid (FOLVITE) 1 MG tablet   Oral   Take 1 tablet (1 mg total) by mouth daily.   90 tablet   3   . metFORMIN (GLUCOPHAGE-XR) 500 MG 24 hr tablet      TAKE 4 TABLETS ONCE DAILY   360 tablet   3   . methocarbamol (ROBAXIN) 500 MG tablet      take 1 tablet by mouth once daily at bedtime   30 tablet   5   . Multiple Vitamins-Minerals (CENTRUM SILVER PO)   Oral   Take 1 tablet by mouth daily.           . pioglitazone (ACTOS) 45 MG tablet      TAKE 1 TABLET DAILY   90 tablet   2   . promethazine-codeine (PHENERGAN WITH CODEINE) 6.25-10 MG/5ML syrup   Oral   Take 5 mLs by mouth every 4 (four) hours as needed for cough.   240 mL   0   . Tamsulosin HCl (FLOMAX) 0.4 MG CAPS   Oral   Take 0.4 mg by mouth daily.           Marland Kitchen testosterone (ANDROGEL) 50 MG/5GM GEL   Transdermal   Place 5 g onto the skin daily.         . traMADol (ULTRAM) 50 MG tablet   Oral   Take 1 tablet (50 mg total) by mouth every 6 (six) hours as needed (fou cough).   50 tablet   11     BP 132/74  Pulse 60  Temp(Src) 97.9 F (36.6 C) (Oral)  Resp 17  Ht 6\' 4"  (1.93 m)  Wt 220 lb (99.791 kg)  BMI 26.79 kg/m2  SpO2  100%  Physical Exam  Nursing note and vitals reviewed. Constitutional: He is oriented to person, place, and time. He appears well-developed and well-nourished.  Alert, slightly pale. Nontoxic appearing  HENT:  Head: Normocephalic and atraumatic.  Eyes: Conjunctivae and EOM are normal. Pupils are equal, round, and reactive to light.  Neck: Normal range of motion. Neck supple.  Cardiovascular: Normal rate, regular rhythm and normal heart sounds.   Pulmonary/Chest: Effort normal and breath sounds normal.  Abdominal: Soft. Bowel sounds are normal.  Musculoskeletal: Normal range of motion.  Neurological: He is alert and oriented to person, place, and time.  Skin: Skin is warm and dry.  Psychiatric: He has a normal mood and affect.    ED Course  Procedures (including critical care time)  Labs Reviewed  CBC WITH DIFFERENTIAL - Abnormal; Notable for the following:    RBC 3.85 (*)    Hemoglobin 11.8 (*)    HCT 33.3 (*)    Monocytes Relative 13 (*)    All other components within normal limits  COMPREHENSIVE METABOLIC PANEL - Abnormal; Notable for the following:    Sodium 134 (*)    Potassium 3.1 (*)    Glucose, Bld 133 (*)    Albumin 3.3 (*)    AST 39 (*)    GFR calc non Af Amer 60 (*)    GFR calc Af Amer 69 (*)    All other components within normal limits  TROPONIN I   Dg Chest 2 View  03/15/2013  *RADIOLOGY REPORT*  Clinical Data: Cough and shortness of breath.  CHEST - 2 VIEW  Comparison: 03/05/2013  Findings: No evidence of infiltrate, edema, effusion or pneumothorax.  Heart size  is stable and within normal limits.  The bony thorax shows stable degenerative changes of the thoracic spine.  IMPRESSION: No active disease.   Original Report Authenticated By: Irish Lack, M.D.      No diagnosis found.   Date: 03/15/2013  Rate: 61  Rhythm: normal sinus rhythm  QRS Axis: normal  Intervals: normal  ST/T Wave abnormalities: normal  Conduction Disutrbances: none  Narrative  Interpretation: unremarkable     MDM  Patient feels much better after IV fluids, IV Zofran.  Oral potassium replacement.  He is urinating well.  No acute abdomen. Discharge meds Protonix 20 mg #10, K  20 meq #15, Zofran 8 mg#10        Donnetta Hutching, MD 03/15/13 1241

## 2013-03-15 NOTE — ED Notes (Signed)
Patient instructed not to stand. Walked in on patient urinating patient standing on the side of bed with wife urinating. States he feels better but is still "out of sorts"

## 2013-03-15 NOTE — ED Notes (Signed)
EMS- patient brought in this morning because he felt dizziness, passing out ( near - syncopal) . Patient did not do either. Pat have 20g in let wrist rec'd 4 mg zofran, cbg 132. Hx- DM , hypotenison, worsening over time. Patient has low BP  mainVS- 58 hr 104/60, rr 20, ? Dehydration - complaints not eating very well. Came from  home.

## 2013-03-18 ENCOUNTER — Ambulatory Visit (INDEPENDENT_AMBULATORY_CARE_PROVIDER_SITE_OTHER): Payer: Medicare Other | Admitting: Endocrinology

## 2013-03-18 ENCOUNTER — Encounter: Payer: Self-pay | Admitting: Endocrinology

## 2013-03-18 VITALS — BP 132/80 | HR 78 | Ht 75.0 in | Wt 217.0 lb

## 2013-03-18 DIAGNOSIS — E119 Type 2 diabetes mellitus without complications: Secondary | ICD-10-CM

## 2013-03-18 DIAGNOSIS — D649 Anemia, unspecified: Secondary | ICD-10-CM | POA: Diagnosis not present

## 2013-03-18 DIAGNOSIS — I10 Essential (primary) hypertension: Secondary | ICD-10-CM

## 2013-03-18 DIAGNOSIS — Z79899 Other long term (current) drug therapy: Secondary | ICD-10-CM | POA: Diagnosis not present

## 2013-03-18 DIAGNOSIS — Z0000 Encounter for general adult medical examination without abnormal findings: Secondary | ICD-10-CM | POA: Diagnosis not present

## 2013-03-18 DIAGNOSIS — R972 Elevated prostate specific antigen [PSA]: Secondary | ICD-10-CM

## 2013-03-18 DIAGNOSIS — E7800 Pure hypercholesterolemia, unspecified: Secondary | ICD-10-CM | POA: Diagnosis not present

## 2013-03-18 DIAGNOSIS — J309 Allergic rhinitis, unspecified: Secondary | ICD-10-CM | POA: Diagnosis not present

## 2013-03-18 LAB — HEPATIC FUNCTION PANEL
ALT: 28 U/L (ref 0–53)
AST: 33 U/L (ref 0–37)
Alkaline Phosphatase: 49 U/L (ref 39–117)
Total Bilirubin: 0.5 mg/dL (ref 0.3–1.2)

## 2013-03-18 LAB — LIPID PANEL
Cholesterol: 120 mg/dL (ref 0–200)
HDL: 29.5 mg/dL — ABNORMAL LOW (ref >39.00)
Total CHOL/HDL Ratio: 4
Triglycerides: 93 mg/dL (ref 0.0–149.0)

## 2013-03-18 LAB — URINALYSIS, ROUTINE W REFLEX MICROSCOPIC
Bilirubin Urine: NEGATIVE
Hgb urine dipstick: NEGATIVE
Total Protein, Urine: NEGATIVE
Urine Glucose: NEGATIVE
pH: 6.5 (ref 5.0–8.0)

## 2013-03-18 LAB — BASIC METABOLIC PANEL
BUN: 19 mg/dL (ref 6–23)
CO2: 30 mEq/L (ref 19–32)
Chloride: 102 mEq/L (ref 96–112)
Creatinine, Ser: 1.3 mg/dL (ref 0.4–1.5)

## 2013-03-18 LAB — MICROALBUMIN / CREATININE URINE RATIO: Microalb Creat Ratio: 0.6 mg/g (ref 0.0–30.0)

## 2013-03-18 LAB — PSA: PSA: 7.82 ng/mL — ABNORMAL HIGH (ref 0.10–4.00)

## 2013-03-18 NOTE — Patient Instructions (Addendum)
blood tests are being requested for you today.  We'll contact you with results. please consider these measures for your health:  minimize alcohol.  do not use tobacco products.  have a colonoscopy at least every 10 years from age 78.  keep firearms safely stored.  always use seat belts.  have working smoke alarms in your home.  see an eye doctor and dentist regularly.  never drive under the influence of alcohol or drugs (including prescription drugs).   Please come back for a follow-up appointment in 6 months.

## 2013-03-18 NOTE — Progress Notes (Signed)
Subjective:    Patient ID: Joseph Rocha, male    DOB: 13-May-1935, 78 y.o.   MRN: [id number]  HPI The state of at least three ongoing medical problems is addressed today, with interval history of each noted here: Pt says he feels much better since recent ER visit.  Specifically, nausea is better, but he still has a slight cough.   DM: he denies weight change.    Dyslipidemia: he denies chest pain  Elevated PSA: he denies decreased urination.   Past Medical History  Diagnosis Date  . Diabetes mellitus   . Hyperlipidemia   . Anemia   . Hypertension   . Allergy   . Pituitary abnormality 2004  . OA (osteoarthritis)   . Elevated PSA   . Hypogonadism male     Past Surgical History  Procedure Laterality Date  . Spine surgery    . Joint replacement    . Transphenoidal / transnasal hypophysectomy / resection pituitary tumor  2005  . Electrocardiogram  03/25/2006  . Doppler echocardiography  12/04/2001    History   Social History  . Marital Status: Married    Spouse Name: N/A    Number of Children: N/A  . Years of Education: N/A   Occupational History  . retired    Social History Main Topics  . Smoking status: Former Smoker    Quit date: 02/22/1957  . Smokeless tobacco: Not on file  . Alcohol Use: No  . Drug Use: No  . Sexually Active: Not Currently   Other Topics Concern  . Not on file   Social History Narrative  . No narrative on file    Current Outpatient Prescriptions on File Prior to Visit  Medication Sig Dispense Refill  . aspirin 81 MG tablet Take 81 mg by mouth daily.      Marland Kitchen atorvastatin (LIPITOR) 80 MG tablet TAKE 1 TABLET DAILY  90 tablet  3  . calcium carbonate (OS-CAL) 600 MG TABS Take 600 mg by mouth daily.      . carbonyl iron ([facility] IRON) 45 MG TABS Take 45 mg by mouth daily.      . cetirizine (ZYRTEC) 10 MG tablet Take 10 mg by mouth daily.      . Cholecalciferol (VITAMIN D3 PO) Take 1 capsule by mouth daily.      . Diclofenac Sodium  (PENNSAID) 1.5 % SOLN Place 5 drops onto the skin 4 (four) times daily as needed (pain).  150 mL  1  . finasteride (PROSCAR) 5 MG tablet Take 5 mg by mouth daily.        . fish oil-omega-3 fatty acids 1000 MG capsule Take 1 g by mouth daily.      . fludrocortisone (FLORINEF) 0.1 MG tablet TAKE 1 TABLET DAILY  90 tablet  3  . Fluticasone-Salmeterol (ADVAIR DISKUS) 100-50 MCG/DOSE AEPB Inhale 1 puff into the lungs 2 (two) times daily as needed (for wheezing).  1 each  0  . folic acid (FOLVITE) 1 MG tablet Take 1 tablet (1 mg total) by mouth daily.  90 tablet  3  . metFORMIN (GLUCOPHAGE-XR) 500 MG 24 hr tablet TAKE 4 TABLETS ONCE DAILY  360 tablet  3  . methocarbamol (ROBAXIN) 500 MG tablet take 1 tablet by mouth once daily at bedtime  30 tablet  5  . Multiple Vitamins-Minerals (CENTRUM SILVER PO) Take 1 tablet by mouth daily.        . ondansetron (ZOFRAN) 8 MG tablet Take 1 tablet (  8 mg total) by mouth every 4 (four) hours as needed for nausea.  10 tablet  0  . pantoprazole (PROTONIX) 20 MG tablet Take 1 tablet (20 mg total) by mouth daily.  10 tablet  1  . pioglitazone (ACTOS) 45 MG tablet TAKE 1 TABLET DAILY  90 tablet  2  . potassium chloride SA (K-DUR,KLOR-CON) 20 MEQ tablet Take 1 tablet (20 mEq total) by mouth daily.  15 tablet  0  . promethazine-codeine (PHENERGAN WITH CODEINE) 6.25-10 MG/5ML syrup Take 5 mLs by mouth every 4 (four) hours as needed for cough.  240 mL  0  . Tamsulosin HCl (FLOMAX) 0.4 MG CAPS Take 0.4 mg by mouth daily.        Marland Kitchen testosterone (ANDROGEL) 50 MG/5GM GEL Place 5 g onto the skin daily.      . traMADol (ULTRAM) 50 MG tablet Take 1 tablet (50 mg total) by mouth every 6 (six) hours as needed (fou cough).  50 tablet  11   No current facility-administered medications on file prior to visit.    No Known Allergies  Family History  Problem Relation Age of Onset  . Cancer Brother     uncertain type    BP 132/80  Pulse 78  Ht 6\' 3"  (1.905 m)  Wt 217 lb (98.431  kg)  BMI 27.12 kg/m2  SpO2 98%   Review of Systems Denies brbpr and sob    Objective:   Physical Exam VITAL SIGNS:  See vs page GENERAL: no distress  Lab Results  Component Value Date   WBC 6.9 03/15/2013   HGB 11.8* 03/15/2013   HCT 33.3* 03/15/2013   PLT 224 03/15/2013   GLUCOSE 86 03/18/2013   CHOL 120 03/18/2013   TRIG 93.0 03/18/2013   HDL 29.50* 03/18/2013   LDLCALC 72 03/18/2013   ALT 28 03/18/2013   AST 33 03/18/2013   NA 137 03/18/2013   K 4.3 03/18/2013   CL 102 03/18/2013   CREATININE 1.3 03/18/2013   BUN 19 03/18/2013   CO2 30 03/18/2013   TSH 3.74 03/18/2013   PSA 7.82* 03/18/2013   HGBA1C 6.0 03/18/2013   MICROALBUR 1.0 03/18/2013      Assessment & Plan:  Elevated PSA, recurrent DM: well-controlled Acute viral illness, much better Dyslipidemia: well-controlled    Subjective:   Patient here for Medicare annual wellness visit and management of other chronic and acute problems.     Risk factors: advanced age    Roster of Physicians Providing Medical Care to Patient:  See "snapshot"   Activities of Daily Living: In your present state of health, do you have any difficulty performing the following activities?:  Preparing food and eating?: No  Bathing yourself: No  Getting dressed: No  Using the toilet:No  Moving around from place to place: No  In the past year have you fallen or had a near fall?: No    Home Safety: Has smoke detector and wears seat belts. No firearms.   Diet and Exercise  Current exercise habits: pt says good Dietary issues discussed: pt reports a healthy diet   Depression Screen  Q1: Over the past two weeks, have you felt down, depressed or hopeless? no  Q2: Over the past two weeks, have you felt little interest or pleasure in doing things? no   The following portions of the patient's history were reviewed and updated as appropriate: allergies, current medications, past family history, past medical history, past social history, past  surgical history  and problem list.  Past Medical History  Diagnosis Date  . Diabetes mellitus   . Hyperlipidemia   . Anemia   . Hypertension   . Allergy   . Pituitary abnormality 2004  . OA (osteoarthritis)   . Elevated PSA   . Hypogonadism male     Past Surgical History  Procedure Laterality Date  . Spine surgery    . Joint replacement    . Transphenoidal / transnasal hypophysectomy / resection pituitary tumor  2005  . Electrocardiogram  03/25/2006  . Doppler echocardiography  12/04/2001    History   Social History  . Marital Status: Married    Spouse Name: N/A    Number of Children: N/A  . Years of Education: N/A   Occupational History  . retired    Social History Main Topics  . Smoking status: Former Smoker    Quit date: 02/22/1957  . Smokeless tobacco: Not on file  . Alcohol Use: No  . Drug Use: No  . Sexually Active: Not Currently   Other Topics Concern  . Not on file   Social History Narrative  . No narrative on file    Current Outpatient Prescriptions on File Prior to Visit  Medication Sig Dispense Refill  . aspirin 81 MG tablet Take 81 mg by mouth daily.      Marland Kitchen atorvastatin (LIPITOR) 80 MG tablet TAKE 1 TABLET DAILY  90 tablet  3  . calcium carbonate (OS-CAL) 600 MG TABS Take 600 mg by mouth daily.      . carbonyl iron ([facility] IRON) 45 MG TABS Take 45 mg by mouth daily.      . cetirizine (ZYRTEC) 10 MG tablet Take 10 mg by mouth daily.      . Cholecalciferol (VITAMIN D3 PO) Take 1 capsule by mouth daily.      . Diclofenac Sodium (PENNSAID) 1.5 % SOLN Place 5 drops onto the skin 4 (four) times daily as needed (pain).  150 mL  1  . finasteride (PROSCAR) 5 MG tablet Take 5 mg by mouth daily.        . fish oil-omega-3 fatty acids 1000 MG capsule Take 1 g by mouth daily.      . fludrocortisone (FLORINEF) 0.1 MG tablet TAKE 1 TABLET DAILY  90 tablet  3  . Fluticasone-Salmeterol (ADVAIR DISKUS) 100-50 MCG/DOSE AEPB Inhale 1 puff into the lungs 2 (two)  times daily as needed (for wheezing).  1 each  0  . folic acid (FOLVITE) 1 MG tablet Take 1 tablet (1 mg total) by mouth daily.  90 tablet  3  . metFORMIN (GLUCOPHAGE-XR) 500 MG 24 hr tablet TAKE 4 TABLETS ONCE DAILY  360 tablet  3  . methocarbamol (ROBAXIN) 500 MG tablet take 1 tablet by mouth once daily at bedtime  30 tablet  5  . Multiple Vitamins-Minerals (CENTRUM SILVER PO) Take 1 tablet by mouth daily.        . ondansetron (ZOFRAN) 8 MG tablet Take 1 tablet (8 mg total) by mouth every 4 (four) hours as needed for nausea.  10 tablet  0  . pantoprazole (PROTONIX) 20 MG tablet Take 1 tablet (20 mg total) by mouth daily.  10 tablet  1  . pioglitazone (ACTOS) 45 MG tablet TAKE 1 TABLET DAILY  90 tablet  2  . potassium chloride SA (K-DUR,KLOR-CON) 20 MEQ tablet Take 1 tablet (20 mEq total) by mouth daily.  15 tablet  0  . promethazine-codeine (PHENERGAN WITH CODEINE)  6.25-10 MG/5ML syrup Take 5 mLs by mouth every 4 (four) hours as needed for cough.  240 mL  0  . Tamsulosin HCl (FLOMAX) 0.4 MG CAPS Take 0.4 mg by mouth daily.        Marland Kitchen testosterone (ANDROGEL) 50 MG/5GM GEL Place 5 g onto the skin daily.      . traMADol (ULTRAM) 50 MG tablet Take 1 tablet (50 mg total) by mouth every 6 (six) hours as needed (fou cough).  50 tablet  11   No current facility-administered medications on file prior to visit.    No Known Allergies  Family History  Problem Relation Age of Onset  . Cancer Brother     uncertain type    BP 132/80  Pulse 78  Ht 6\' 3"  (1.905 m)  Wt 217 lb (98.431 kg)  BMI 27.12 kg/m2  SpO2 98%   Review of Systems  Denies hearing loss, and visual loss Objective:   Vision:  Sees opthalmologist Hearing: grossly normal Body mass index:  See vs page Msk: pt easily and quickly performs "get-up-and-go" from a sitting position Cognitive Impairment Assessment: cognition, memory and judgment appear normal.  remembers 3/3 at 5 minutes.  excellent recall.  can easily read and write a  sentence.  alert and oriented x 3.   Assessment:   Medicare wellness utd on preventive parameters.     Plan:   During the course of the visit the patient was educated and counseled about appropriate screening and preventive services including:        Fall prevention Diabetes screening  Nutrition counseling   Vaccines / LABS Zostavax / Pnemonccoal Vaccine  today  PSA  Patient Instructions (the written plan) was given to the patient.    we discussed code status.  pt requests full code, but would not want to be started or maintained on artificial life-support measures if there was not a reasonable chance of recovery.

## 2013-03-24 ENCOUNTER — Other Ambulatory Visit: Payer: Self-pay | Admitting: Endocrinology

## 2013-03-25 ENCOUNTER — Encounter: Payer: Self-pay | Admitting: Endocrinology

## 2013-03-25 ENCOUNTER — Ambulatory Visit (INDEPENDENT_AMBULATORY_CARE_PROVIDER_SITE_OTHER): Payer: Medicare Other | Admitting: Endocrinology

## 2013-03-25 VITALS — BP 124/72 | Temp 98.7°F | Wt 219.0 lb

## 2013-03-25 DIAGNOSIS — R110 Nausea: Secondary | ICD-10-CM | POA: Diagnosis not present

## 2013-03-25 DIAGNOSIS — R112 Nausea with vomiting, unspecified: Secondary | ICD-10-CM | POA: Insufficient documentation

## 2013-03-25 DIAGNOSIS — J309 Allergic rhinitis, unspecified: Secondary | ICD-10-CM | POA: Diagnosis not present

## 2013-03-25 MED ORDER — PANTOPRAZOLE SODIUM 20 MG PO TBEC: 20.0000 mg | DELAYED_RELEASE_TABLET | Freq: Every day | ORAL | Status: DC

## 2013-03-25 NOTE — Patient Instructions (Addendum)
Please stop the metformin, potassium, iron, and diclofenac (pills) for now.   Take the ondansetron as needed for nausea.   Please also continue the pantoprazole for now.  Refer to dr Marina Goodell.  you will receive a phone call, about a day and time for an appointment.

## 2013-03-25 NOTE — Progress Notes (Signed)
Subjective:    Patient ID: Joseph Rocha, male    DOB: May 18, 1935, 78 y.o.   MRN: [id number]  HPI Pt states few weeks of moderate pain at the epigastric area, and associated bloating.  Joseph Rocha denies fever and brbpr.  sxs are worse Past Medical History  Diagnosis Date  . Diabetes mellitus   . Hyperlipidemia   . Anemia   . Hypertension   . Allergy   . Pituitary abnormality 2004  . OA (osteoarthritis)   . Elevated PSA   . Hypogonadism male     Past Surgical History  Procedure Laterality Date  . Spine surgery    . Joint replacement    . Transphenoidal / transnasal hypophysectomy / resection pituitary tumor  2005  . Electrocardiogram  03/25/2006  . Doppler echocardiography  12/04/2001    History   Social History  . Marital Status: Married    Spouse Name: N/A    Number of Children: N/A  . Years of Education: N/A   Occupational History  . retired    Social History Main Topics  . Smoking status: Former Smoker    Quit date: 02/22/1957  . Smokeless tobacco: Not on file  . Alcohol Use: No  . Drug Use: No  . Sexually Active: Not Currently   Other Topics Concern  . Not on file   Social History Narrative  . No narrative on file    Current Outpatient Prescriptions on File Prior to Visit  Medication Sig Dispense Refill  . aspirin 81 MG tablet Take 81 mg by mouth daily.      Marland Kitchen atorvastatin (LIPITOR) 80 MG tablet TAKE 1 TABLET DAILY  90 tablet  3  . calcium carbonate (OS-CAL) 600 MG TABS Take 600 mg by mouth daily.      . cetirizine (ZYRTEC) 10 MG tablet Take 10 mg by mouth daily.      . Cholecalciferol (VITAMIN D3 PO) Take 1 capsule by mouth daily.      . Diclofenac Sodium (PENNSAID) 1.5 % SOLN Place 5 drops onto the skin 4 (four) times daily as needed (pain).  150 mL  1  . finasteride (PROSCAR) 5 MG tablet Take 5 mg by mouth daily.        . fish oil-omega-3 fatty acids 1000 MG capsule Take 1 g by mouth daily.      . fludrocortisone (FLORINEF) 0.1 MG tablet TAKE 1  TABLET DAILY  90 tablet  3  . Fluticasone-Salmeterol (ADVAIR DISKUS) 100-50 MCG/DOSE AEPB Inhale 1 puff into the lungs 2 (two) times daily as needed (for wheezing).  1 each  0  . folic acid (FOLVITE) 1 MG tablet Take 1 tablet (1 mg total) by mouth daily.  90 tablet  3  . methocarbamol (ROBAXIN) 500 MG tablet take 1 tablet by mouth once daily at bedtime  30 tablet  5  . Multiple Vitamins-Minerals (CENTRUM SILVER PO) Take 1 tablet by mouth daily.        . ondansetron (ZOFRAN) 8 MG tablet Take 1 tablet (8 mg total) by mouth every 4 (four) hours as needed for nausea.  10 tablet  0  . pioglitazone (ACTOS) 45 MG tablet TAKE 1 TABLET DAILY  90 tablet  2  . Tamsulosin HCl (FLOMAX) 0.4 MG CAPS Take 0.4 mg by mouth daily.        Marland Kitchen testosterone (ANDROGEL) 50 MG/5GM GEL Place 5 g onto the skin daily.      . traMADol (ULTRAM) 50 MG tablet  Take 1 tablet (50 mg total) by mouth every 6 (six) hours as needed (fou cough).  50 tablet  11   No current facility-administered medications on file prior to visit.    No Known Allergies  Family History  Problem Relation Age of Onset  . Cancer Brother     uncertain type    BP 124/72  Temp(Src) 98.7 F (37.1 C) (Oral)  Wt 219 lb (99.338 kg)  BMI 27.37 kg/m2  SpO2 98%  Review of Systems Joseph Rocha has nausea but no vomiting.      Objective:   Physical Exam VITAL SIGNS:  See vs page GENERAL: no distress ABDOMEN: abdomen is soft, nontender.  no hepatosplenomegaly. not distended.  no hernia    Lab Results  Component Value Date   WBC 6.9 03/15/2013   HGB 11.8* 03/15/2013   HCT 33.3* 03/15/2013   PLT 224 03/15/2013   GLUCOSE 86 03/18/2013   CHOL 120 03/18/2013   TRIG 93.0 03/18/2013   HDL 29.50* 03/18/2013   LDLCALC 72 03/18/2013   ALT 28 03/18/2013   AST 33 03/18/2013   NA 137 03/18/2013   K 4.3 03/18/2013   CL 102 03/18/2013   CREATININE 1.3 03/18/2013   BUN 19 03/18/2013   CO2 30 03/18/2013   TSH 3.74 03/18/2013   PSA 7.82* 03/18/2013   HGBA1C 6.0 03/18/2013    MICROALBUR 1.0 03/18/2013      Assessment & Plan:  Nausea, prob due to multiple meds DM: based on this a1c, Joseph Rocha can tolerate being off metformin for now. OA: Joseph Rocha wants to go off voltaren for now, to see if this helps GI sxs.

## 2013-03-26 ENCOUNTER — Telehealth: Payer: Self-pay

## 2013-03-26 NOTE — Telephone Encounter (Signed)
Try also taking "pepcid."  You should still go on your trip.

## 2013-03-26 NOTE — Telephone Encounter (Signed)
Pt would like to know what else to do?

## 2013-03-26 NOTE — Telephone Encounter (Signed)
Pt would like to know what else to do?  

## 2013-03-26 NOTE — Telephone Encounter (Signed)
Pt called states he is still bloated, consitpated and feels bad wanted to know if you could speak with Dr. Marina Goodell today, or get him an appt to see Dr. Marina Goodell as he is leaving in the am.

## 2013-03-26 NOTE — Telephone Encounter (Signed)
i called gi.  They have nothing available for the next 8 days.  i hope you feel better soon.

## 2013-03-26 NOTE — Telephone Encounter (Signed)
Pt advised.

## 2013-04-08 ENCOUNTER — Encounter: Payer: Self-pay | Admitting: Gastroenterology

## 2013-04-08 ENCOUNTER — Ambulatory Visit (INDEPENDENT_AMBULATORY_CARE_PROVIDER_SITE_OTHER): Payer: Medicare Other | Admitting: Gastroenterology

## 2013-04-08 VITALS — BP 100/60 | HR 60 | Ht 75.0 in | Wt 218.8 lb

## 2013-04-08 DIAGNOSIS — J309 Allergic rhinitis, unspecified: Secondary | ICD-10-CM | POA: Diagnosis not present

## 2013-04-08 DIAGNOSIS — R112 Nausea with vomiting, unspecified: Secondary | ICD-10-CM

## 2013-04-08 NOTE — Patient Instructions (Addendum)
Please continue pantoprazole daily for as long as you are taking any type of NSAID. Follow up as needed. CC:  Romero Belling MD

## 2013-04-08 NOTE — Progress Notes (Signed)
History of Present Illness: This is a 78 year old male referred for evaluation of nausea. He presented to the emergency department in early May with the acute onset of nausea, vomiting and dizziness. He was dehydrated and he was treated for a self-limited gastroenteritis. He was evaluated by Dr. Lanna Poche in mid-May with persistent symptoms of nausea and Dr. Everardo All recommended he discontinue diclofenac and which he tried for about one week however his cervical osteoarthritis significantly worsened and he resumed diclofenac and denies any ongoing GI symptoms.  Current Medications, Allergies, Past Medical History, Past Surgical History, Family History and Social History were reviewed in [employer] record.  Physical Exam: General: Well developed , well nourished, no acute distress Head: Normocephalic and atraumatic Eyes:  sclerae anicteric, EOMI Ears: Normal auditory acuity Mouth: No deformity or lesions Lungs: Clear throughout to auscultation Heart: Regular rate and rhythm; no murmurs, rubs or bruits Abdomen: Soft, non tender and non distended. No masses, hepatosplenomegaly or hernias noted. Normal Bowel sounds Musculoskeletal: Symmetrical with no gross deformities  Pulses:  Normal pulses noted Extremities: No clubbing, cyanosis, edema or deformities noted Neurological: Alert oriented x 4, grossly nonfocal Psychological:  Alert and cooperative. Normal mood and affect  Assessment and Recommendations:  1. Nausea, now resolved. Possibly gastritis or ulcer related to diclofenac. He is currently asymptomatic. I recommended that he remain on a PPI as long as he takes NSAIDs regularly. Continue pantoprazole 20 mg daily. If he has persistent upper GI tract symptoms I recommended proceeding with upper endoscopy to further evaluate.  2 Colorectal cancer screening, average risk. Colonoscopy performed in February 2012 showing diverticulosis and hemorrhoids.Marland Kitchen

## 2013-04-15 DIAGNOSIS — J309 Allergic rhinitis, unspecified: Secondary | ICD-10-CM | POA: Diagnosis not present

## 2013-04-17 DIAGNOSIS — J309 Allergic rhinitis, unspecified: Secondary | ICD-10-CM | POA: Diagnosis not present

## 2013-04-22 DIAGNOSIS — J309 Allergic rhinitis, unspecified: Secondary | ICD-10-CM | POA: Diagnosis not present

## 2013-04-29 DIAGNOSIS — J309 Allergic rhinitis, unspecified: Secondary | ICD-10-CM | POA: Diagnosis not present

## 2013-05-12 ENCOUNTER — Telehealth: Payer: Self-pay

## 2013-05-12 DIAGNOSIS — M542 Cervicalgia: Secondary | ICD-10-CM

## 2013-05-12 MED ORDER — MELOXICAM 15 MG PO TABS: 15.0000 mg | ORAL_TABLET | Freq: Every day | ORAL | Status: DC

## 2013-05-12 NOTE — Telephone Encounter (Signed)
Called pt >> has pain from bone spurs in post. cervical area >> would like a stronger antiinflammatory med. Tried Ibuprofen 400 mg q2h prn w/o result. Will try Mobic which he had in the past and may try Voltaren EC tabs which he had more recently. He does not want Tramadol as this is not helping.

## 2013-05-12 NOTE — Telephone Encounter (Signed)
Sonya, see below.

## 2013-05-12 NOTE — Telephone Encounter (Signed)
Pt called requesting rx for neck pain and inflammation, please advise [phone]

## 2013-05-13 ENCOUNTER — Other Ambulatory Visit: Payer: Self-pay | Admitting: Endocrinology

## 2013-05-13 DIAGNOSIS — J309 Allergic rhinitis, unspecified: Secondary | ICD-10-CM | POA: Diagnosis not present

## 2013-05-14 DIAGNOSIS — J301 Allergic rhinitis due to pollen: Secondary | ICD-10-CM | POA: Diagnosis not present

## 2013-05-14 DIAGNOSIS — J3089 Other allergic rhinitis: Secondary | ICD-10-CM | POA: Diagnosis not present

## 2013-05-18 ENCOUNTER — Encounter: Payer: Self-pay | Admitting: Endocrinology

## 2013-05-18 ENCOUNTER — Ambulatory Visit (INDEPENDENT_AMBULATORY_CARE_PROVIDER_SITE_OTHER): Payer: Medicare Other | Admitting: Endocrinology

## 2013-05-18 VITALS — BP 134/72 | HR 69 | Ht 74.0 in | Wt 218.0 lb

## 2013-05-18 DIAGNOSIS — M1990 Unspecified osteoarthritis, unspecified site: Secondary | ICD-10-CM

## 2013-05-18 MED ORDER — METHYLPREDNISOLONE (PAK) 4 MG PO TABS: ORAL_TABLET | ORAL | Status: DC

## 2013-05-18 MED ORDER — HYDROCODONE-ACETAMINOPHEN 10-325 MG PO TABS: 1.0000 | ORAL_TABLET | Freq: Four times a day (QID) | ORAL | Status: DC | PRN

## 2013-05-18 NOTE — Progress Notes (Signed)
Subjective:    Patient ID: Joseph Rocha, male    DOB: 1934-11-24, 78 y.o.   MRN: [id number]  HPI Pt states few weeks of moderate pain at the back of the neck, but no assoc numbness.  He has seen dr Venetia Maxon for this, but has never had neck surgery.   He had only slight relief with mobic. Past Medical History  Diagnosis Date  . Diabetes mellitus   . Hyperlipidemia   . Anemia   . Hypertension   . Allergy   . Pituitary abnormality 2004  . OA (osteoarthritis)   . Elevated PSA   . Hypogonadism male     Past Surgical History  Procedure Laterality Date  . Spine surgery    . Joint replacement    . Transphenoidal / transnasal hypophysectomy / resection pituitary tumor  2005  . Electrocardiogram  03/25/2006  . Doppler echocardiography  12/04/2001    History   Social History  . Marital Status: Married    Spouse Name: N/A    Number of Children: 2  . Years of Education: N/A   Occupational History  . retired    Social History Main Topics  . Smoking status: Former Smoker    Quit date: 02/22/1957  . Smokeless tobacco: Never Used  . Alcohol Use: No  . Drug Use: No  . Sexually Active: Not Currently   Other Topics Concern  . Not on file   Social History Narrative  . No narrative on file    Current Outpatient Prescriptions on File Prior to Visit  Medication Sig Dispense Refill  . aspirin 81 MG tablet Take 81 mg by mouth daily.      Marland Kitchen atorvastatin (LIPITOR) 80 MG tablet TAKE 1 TABLET DAILY  90 tablet  3  . calcium carbonate (OS-CAL) 600 MG TABS Take 600 mg by mouth daily.      . cetirizine (ZYRTEC) 10 MG tablet Take 10 mg by mouth daily.      . Cholecalciferol (VITAMIN D3 PO) Take 1 capsule by mouth daily.      . Diclofenac Sodium (PENNSAID) 1.5 % SOLN Place 5 drops onto the skin 4 (four) times daily as needed (pain).  150 mL  1  . finasteride (PROSCAR) 5 MG tablet Take 5 mg by mouth daily.        . fish oil-omega-3 fatty acids 1000 MG capsule Take 1 g by mouth daily.       . fludrocortisone (FLORINEF) 0.1 MG tablet TAKE 1 TABLET DAILY  90 tablet  3  . Fluticasone-Salmeterol (ADVAIR DISKUS) 100-50 MCG/DOSE AEPB Inhale 1 puff into the lungs 2 (two) times daily as needed (for wheezing).  1 each  0  . folic acid (FOLVITE) 1 MG tablet Take 1 tablet (1 mg total) by mouth daily.  90 tablet  3  . Multiple Vitamins-Minerals (CENTRUM SILVER PO) Take 1 tablet by mouth daily.        . ondansetron (ZOFRAN) 8 MG tablet Take 1 tablet (8 mg total) by mouth every 4 (four) hours as needed for nausea.  10 tablet  0  . pantoprazole (PROTONIX) 20 MG tablet Take 1 tablet (20 mg total) by mouth daily.  30 tablet  1  . pioglitazone (ACTOS) 45 MG tablet TAKE 1 TABLET DAILY  90 tablet  2  . Tamsulosin HCl (FLOMAX) 0.4 MG CAPS Take 0.4 mg by mouth daily.        Marland Kitchen testosterone (ANDROGEL) 50 MG/5GM GEL Place 5 g  onto the skin daily.       No current facility-administered medications on file prior to visit.    No Known Allergies  Family History  Problem Relation Age of Onset  . Cancer Brother     uncertain type    BP 134/72  Pulse 69  Ht 6\' 2"  (1.88 m)  Wt 218 lb (98.884 kg)  BMI 27.98 kg/m2  SpO2 98%  Review of Systems Denies radiation to the arms.      Objective:   Physical Exam VITAL SIGNS:  See vs page GENERAL: no distress Neck: nontender.  Full rom, but rom is painful.       Assessment & Plan:  Neck pain, uncertain etiology

## 2013-05-18 NOTE — Patient Instructions (Signed)
Here is a prescription for a stronger pain pill. i have sent a prescription to your pharmacy, for a steroid "pack." Please see dr Venetia Maxon as scheduled.

## 2013-05-27 ENCOUNTER — Other Ambulatory Visit: Payer: Self-pay | Admitting: *Deleted

## 2013-05-27 DIAGNOSIS — J309 Allergic rhinitis, unspecified: Secondary | ICD-10-CM | POA: Diagnosis not present

## 2013-05-27 MED ORDER — PANTOPRAZOLE SODIUM 20 MG PO TBEC: 20.0000 mg | DELAYED_RELEASE_TABLET | Freq: Every day | ORAL | Status: DC

## 2013-05-28 ENCOUNTER — Other Ambulatory Visit: Payer: Self-pay | Admitting: *Deleted

## 2013-05-28 ENCOUNTER — Telehealth: Payer: Self-pay | Admitting: *Deleted

## 2013-05-28 MED ORDER — MELOXICAM 15 MG PO TABS: 15.0000 mg | ORAL_TABLET | Freq: Every day | ORAL | Status: DC

## 2013-05-28 NOTE — Telephone Encounter (Signed)
Patient is requesting a refill of Mobic 15 mg. Is this okay to fill?

## 2013-05-28 NOTE — Telephone Encounter (Signed)
ok 

## 2013-06-03 DIAGNOSIS — M542 Cervicalgia: Secondary | ICD-10-CM | POA: Diagnosis not present

## 2013-06-03 DIAGNOSIS — M4712 Other spondylosis with myelopathy, cervical region: Secondary | ICD-10-CM | POA: Diagnosis not present

## 2013-06-03 DIAGNOSIS — M4802 Spinal stenosis, cervical region: Secondary | ICD-10-CM | POA: Diagnosis not present

## 2013-06-03 DIAGNOSIS — M5030 Other cervical disc degeneration, unspecified cervical region: Secondary | ICD-10-CM | POA: Diagnosis not present

## 2013-06-04 ENCOUNTER — Other Ambulatory Visit: Payer: Self-pay | Admitting: Neurosurgery

## 2013-06-04 DIAGNOSIS — M4802 Spinal stenosis, cervical region: Secondary | ICD-10-CM

## 2013-06-09 ENCOUNTER — Ambulatory Visit
Admission: RE | Admit: 2013-06-09 | Discharge: 2013-06-09 | Disposition: A | Discharge status: Home / Self Care | Payer: Medicare Other | Source: Ambulatory Visit | Attending: Neurosurgery | Admitting: Neurosurgery

## 2013-06-09 DIAGNOSIS — M47812 Spondylosis without myelopathy or radiculopathy, cervical region: Secondary | ICD-10-CM | POA: Diagnosis not present

## 2013-06-09 DIAGNOSIS — M4802 Spinal stenosis, cervical region: Secondary | ICD-10-CM

## 2013-06-10 DIAGNOSIS — J309 Allergic rhinitis, unspecified: Secondary | ICD-10-CM | POA: Diagnosis not present

## 2013-06-17 DIAGNOSIS — M4802 Spinal stenosis, cervical region: Secondary | ICD-10-CM | POA: Diagnosis not present

## 2013-06-17 DIAGNOSIS — M542 Cervicalgia: Secondary | ICD-10-CM | POA: Diagnosis not present

## 2013-06-17 DIAGNOSIS — M4712 Other spondylosis with myelopathy, cervical region: Secondary | ICD-10-CM | POA: Diagnosis not present

## 2013-06-23 DIAGNOSIS — M47812 Spondylosis without myelopathy or radiculopathy, cervical region: Secondary | ICD-10-CM | POA: Diagnosis not present

## 2013-06-23 DIAGNOSIS — M542 Cervicalgia: Secondary | ICD-10-CM | POA: Diagnosis not present

## 2013-06-24 DIAGNOSIS — J309 Allergic rhinitis, unspecified: Secondary | ICD-10-CM | POA: Diagnosis not present

## 2013-07-08 DIAGNOSIS — J309 Allergic rhinitis, unspecified: Secondary | ICD-10-CM | POA: Diagnosis not present

## 2013-07-10 ENCOUNTER — Other Ambulatory Visit: Payer: Self-pay | Admitting: Endocrinology

## 2013-07-22 DIAGNOSIS — J309 Allergic rhinitis, unspecified: Secondary | ICD-10-CM | POA: Diagnosis not present

## 2013-07-23 DIAGNOSIS — M47812 Spondylosis without myelopathy or radiculopathy, cervical region: Secondary | ICD-10-CM | POA: Diagnosis not present

## 2013-07-23 DIAGNOSIS — M542 Cervicalgia: Secondary | ICD-10-CM | POA: Diagnosis not present

## 2013-07-31 DIAGNOSIS — E291 Testicular hypofunction: Secondary | ICD-10-CM | POA: Diagnosis not present

## 2013-07-31 DIAGNOSIS — N401 Enlarged prostate with lower urinary tract symptoms: Secondary | ICD-10-CM | POA: Diagnosis not present

## 2013-07-31 DIAGNOSIS — R351 Nocturia: Secondary | ICD-10-CM | POA: Diagnosis not present

## 2013-08-05 DIAGNOSIS — J309 Allergic rhinitis, unspecified: Secondary | ICD-10-CM | POA: Diagnosis not present

## 2013-08-07 DIAGNOSIS — J309 Allergic rhinitis, unspecified: Secondary | ICD-10-CM | POA: Diagnosis not present

## 2013-08-19 DIAGNOSIS — J309 Allergic rhinitis, unspecified: Secondary | ICD-10-CM | POA: Diagnosis not present

## 2013-09-03 DIAGNOSIS — J309 Allergic rhinitis, unspecified: Secondary | ICD-10-CM | POA: Diagnosis not present

## 2013-09-07 ENCOUNTER — Ambulatory Visit (INDEPENDENT_AMBULATORY_CARE_PROVIDER_SITE_OTHER): Payer: Medicare Other | Admitting: Endocrinology

## 2013-09-07 ENCOUNTER — Encounter: Payer: Self-pay | Admitting: Endocrinology

## 2013-09-07 VITALS — BP 118/70 | HR 65 | Temp 98.3°F | Resp 12 | Ht 75.0 in | Wt 215.0 lb

## 2013-09-07 DIAGNOSIS — Z23 Encounter for immunization: Secondary | ICD-10-CM

## 2013-09-07 DIAGNOSIS — E119 Type 2 diabetes mellitus without complications: Secondary | ICD-10-CM

## 2013-09-07 NOTE — Progress Notes (Signed)
Subjective:    Patient ID: Joseph Rocha, male    DOB: 13-Oct-1935, 78 y.o.   MRN: [id number]  HPI Pt returns for f/u of type 2 DM (dx'ed 2009; he has mild if any neuropathy of the lower extremities; no known associated chronic complications).    Pt aske why he needs to continue the florinef. Past Medical History  Diagnosis Date  . Diabetes mellitus   . Hyperlipidemia   . Anemia   . Hypertension   . Allergy   . Pituitary abnormality 2004  . OA (osteoarthritis)   . Elevated PSA   . Hypogonadism male     Past Surgical History  Procedure Laterality Date  . Spine surgery    . Joint replacement    . Transphenoidal / transnasal hypophysectomy / resection pituitary tumor  2005  . Electrocardiogram  03/25/2006  . Doppler echocardiography  12/04/2001    History   Social History  . Marital Status: Married    Spouse Name: N/A    Number of Children: 2  . Years of Education: N/A   Occupational History  . retired    Social History Main Topics  . Smoking status: Former Smoker    Quit date: 02/22/1957  . Smokeless tobacco: Never Used  . Alcohol Use: No  . Drug Use: No  . Sexual Activity: Not Currently   Other Topics Concern  . Not on file   Social History Narrative  . No narrative on file    Current Outpatient Prescriptions on File Prior to Visit  Medication Sig Dispense Refill  . aspirin 81 MG tablet Take 81 mg by mouth daily.      Marland Kitchen atorvastatin (LIPITOR) 80 MG tablet TAKE 1 TABLET DAILY  90 tablet  3  . calcium carbonate (OS-CAL) 600 MG TABS Take 600 mg by mouth daily.      . cetirizine (ZYRTEC) 10 MG tablet Take 10 mg by mouth daily.      . Cholecalciferol (VITAMIN D3 PO) Take 1 capsule by mouth daily.      . Diclofenac Sodium (PENNSAID) 1.5 % SOLN Place 5 drops onto the skin 4 (four) times daily as needed (pain).  150 mL  1  . finasteride (PROSCAR) 5 MG tablet Take 5 mg by mouth daily.        . fish oil-omega-3 fatty acids 1000 MG capsule Take 1 g by mouth  daily.      . fludrocortisone (FLORINEF) 0.1 MG tablet TAKE 1 TABLET DAILY  90 tablet  3  . folic acid (FOLVITE) 1 MG tablet Take 1 tablet (1 mg total) by mouth daily.  90 tablet  3  . HYDROcodone-acetaminophen (NORCO) 10-325 MG per tablet Take 1 tablet by mouth every 6 (six) hours as needed for pain.  30 tablet  0  . KLOR-CON M20 20 MEQ tablet       . meloxicam (MOBIC) 15 MG tablet Take 1 tablet (15 mg total) by mouth daily.  30 tablet  0  . Multiple Vitamins-Minerals (CENTRUM SILVER PO) Take 1 tablet by mouth daily.        . ondansetron (ZOFRAN) 8 MG tablet Take 1 tablet (8 mg total) by mouth every 4 (four) hours as needed for nausea.  10 tablet  0  . pantoprazole (PROTONIX) 20 MG tablet Take 1 tablet (20 mg total) by mouth daily.  30 tablet  5  . pioglitazone (ACTOS) 45 MG tablet TAKE 1 TABLET DAILY  90 tablet  2  .  Tamsulosin HCl (FLOMAX) 0.4 MG CAPS Take 0.4 mg by mouth daily.        Marland Kitchen testosterone (ANDROGEL) 50 MG/5GM GEL Place 5 g onto the skin daily.       No current facility-administered medications on file prior to visit.   No Known Allergies  Family History  Problem Relation Age of Onset  . Cancer Brother     uncertain type   BP 118/70  Pulse 65  Temp(Src) 98.3 F (36.8 C)  Resp 12  Ht 6\' 3"  (1.905 m)  Wt 215 lb (97.523 kg)  BMI 26.87 kg/m2  SpO2 98%  Review of Systems Denies recent dizziness and LOC.      Objective:   Physical Exam VITAL SIGNS:  See vs page.  GENERAL: no distress.    Lab Results  Component Value Date   HGBA1C 5.9 09/07/2013      Assessment & Plan:  DM: well-controlled. H/o recurrent syncope, well-controlled.  i explained to pt the reason for the florinef.

## 2013-09-07 NOTE — Patient Instructions (Addendum)
blood tests are being requested for you today.  We'll contact you with results. Please come back for a "medicare wellness" appointment after 03/18/14.

## 2013-09-14 ENCOUNTER — Encounter: Payer: Self-pay | Admitting: Interventional Cardiology

## 2013-09-17 DIAGNOSIS — J309 Allergic rhinitis, unspecified: Secondary | ICD-10-CM | POA: Diagnosis not present

## 2013-09-23 ENCOUNTER — Encounter: Payer: Medicare Other | Admitting: Endocrinology

## 2013-09-23 ENCOUNTER — Encounter: Payer: Self-pay | Admitting: Endocrinology

## 2013-09-23 MED ORDER — MELOXICAM 15 MG PO TABS: 15.0000 mg | ORAL_TABLET | Freq: Every day | ORAL | Status: DC

## 2013-09-23 NOTE — Progress Notes (Signed)
  Subjective:    Patient ID: Joseph Rocha, male    DOB: September 16, 1935, 78 y.o.   MRN: [id number]  HPI No visit today.  Scheduled in error.   Review of Systems     Objective:   Physical Exam        Assessment & Plan:

## 2013-09-23 NOTE — Patient Instructions (Addendum)
Please come back for a "medicare wellness" appointment after 03/18/14.

## 2013-09-28 DIAGNOSIS — J309 Allergic rhinitis, unspecified: Secondary | ICD-10-CM | POA: Diagnosis not present

## 2013-10-14 DIAGNOSIS — J309 Allergic rhinitis, unspecified: Secondary | ICD-10-CM | POA: Diagnosis not present

## 2013-10-15 DIAGNOSIS — M542 Cervicalgia: Secondary | ICD-10-CM | POA: Diagnosis not present

## 2013-10-15 DIAGNOSIS — M47812 Spondylosis without myelopathy or radiculopathy, cervical region: Secondary | ICD-10-CM | POA: Diagnosis not present

## 2013-10-26 DIAGNOSIS — J309 Allergic rhinitis, unspecified: Secondary | ICD-10-CM | POA: Diagnosis not present

## 2013-11-10 DIAGNOSIS — M47812 Spondylosis without myelopathy or radiculopathy, cervical region: Secondary | ICD-10-CM | POA: Diagnosis not present

## 2013-11-10 DIAGNOSIS — M542 Cervicalgia: Secondary | ICD-10-CM | POA: Diagnosis not present

## 2013-11-13 ENCOUNTER — Other Ambulatory Visit: Payer: Self-pay | Admitting: Endocrinology

## 2013-11-20 DIAGNOSIS — J309 Allergic rhinitis, unspecified: Secondary | ICD-10-CM | POA: Diagnosis not present

## 2013-12-03 DIAGNOSIS — J309 Allergic rhinitis, unspecified: Secondary | ICD-10-CM | POA: Diagnosis not present

## 2013-12-09 ENCOUNTER — Ambulatory Visit: Payer: Medicare Other | Admitting: Interventional Cardiology

## 2013-12-16 DIAGNOSIS — M4802 Spinal stenosis, cervical region: Secondary | ICD-10-CM | POA: Diagnosis not present

## 2013-12-16 DIAGNOSIS — M542 Cervicalgia: Secondary | ICD-10-CM | POA: Diagnosis not present

## 2013-12-16 DIAGNOSIS — R259 Unspecified abnormal involuntary movements: Secondary | ICD-10-CM | POA: Diagnosis not present

## 2013-12-16 DIAGNOSIS — M4712 Other spondylosis with myelopathy, cervical region: Secondary | ICD-10-CM | POA: Diagnosis not present

## 2013-12-30 DIAGNOSIS — J309 Allergic rhinitis, unspecified: Secondary | ICD-10-CM | POA: Diagnosis not present

## 2014-01-14 DIAGNOSIS — J309 Allergic rhinitis, unspecified: Secondary | ICD-10-CM | POA: Diagnosis not present

## 2014-01-19 ENCOUNTER — Ambulatory Visit: Payer: Medicare Other | Admitting: Interventional Cardiology

## 2014-01-28 ENCOUNTER — Other Ambulatory Visit: Payer: Self-pay | Admitting: Endocrinology

## 2014-01-29 ENCOUNTER — Ambulatory Visit (INDEPENDENT_AMBULATORY_CARE_PROVIDER_SITE_OTHER): Payer: Medicare Other | Admitting: Neurology

## 2014-01-29 ENCOUNTER — Encounter: Payer: Self-pay | Admitting: Neurology

## 2014-01-29 VITALS — BP 124/60 | HR 66 | Resp 16 | Ht 75.5 in | Wt 227.0 lb

## 2014-01-29 DIAGNOSIS — G4762 Sleep related leg cramps: Secondary | ICD-10-CM | POA: Diagnosis not present

## 2014-01-29 NOTE — Patient Instructions (Addendum)
1.  Check blood work today 2.  Encouraged to stay well hydrated 3.  If symptoms do not improve, my consider EMG going forward.   4.  It is okay to stop taking lipitor for 23-months to see if this is contributing to symptoms, if ok with primary physician 5.  Continue to take methocarbamol 500mg  at bedtime as needed 6.  Encouraged to perform leg stretches at bedtime 7.  Return to clinic in 12-months

## 2014-01-29 NOTE — Progress Notes (Signed)
[facility] Clinic Note - Initial Visit   Date: 01/29/2014    Joseph Rocha MRN: [id number] DOB: 04-Jan-1935   Dear Dr Vertell Limber:  Thank you for your kind referral of Joseph Rocha for consultation of muscle cramps and fasiculations. Although his history is well known to you, please allow [place] to reiterate it for the purpose of our medical record. The patient was accompanied to the clinic by self.    History of Present Illness: Joseph Rocha is a 80 y.o. right-handed African American male with history of hyperlipidemia, diabetes mellitus, GERD, seasonal allergies, lumbar stenosis s/p back surgery, pituitary adenoma s/p resection, and BPH presenting for evaluation of muscle cramps.    For the past 5 years, he has noticed muscle cramps involving his legs.  Over the past 24-months, cramps started occuring in the hands as well. They occur about 1-2 times per week.  Exertion can worsen cramps. He has tried putting soap under his sheets and said it may help.   He saw his PCP who started robaxin as needed which has significantly helped.  He has mild weakness with climbing stairs.  He was evaluated by Dr. Vertell Limber on in February who has referred him to neurology for further evaluation as his symptoms are likely unrelated to his cervical spine stenosis.  He denies any numbness/tingling, problems with swallowing/talking, muscle tenderness, or dark-colored urine.   Out-side paper records, electronic medical record, and images have been reviewed where available and summarized as:  Lab Results  Component Value Date   CKTOTAL 451* 07/21/2012   TROPONINI <0.30 03/15/2013   Lab Results  Component Value Date   HGBA1C 5.9 09/07/2013   Lab Results  Component Value Date   TSH 3.74 03/18/2013   MRI cervical spine 06/04/2013:  1. C2-C3 facet arthropathy on the left appears increased since 01/21/2012, but other cervical spine findings appear stable since that time.  2.  Multifactorial cervical spinal stenosis with predominately mild spinal cord mass effect C2-C3 through C6-C7. No associated spinal cord signal abnormality.  3. Multifactorial severe cervical foraminal stenosis is stable at the left C3, bilateral C5, bilateral C6 and bilateral C7 nerve  levels.  MRI brain 01/21/2012: 1. Stable postoperative appearance of the sella turcica with no adverse features.  2. No acute intracranial abnormality.  MRI lumbar spine wwo contrast 08/06/2006: 1. L3-4: Severe multifactorial stenosis because of broad base herniation of disc material in combination with facet and ligamentous hypertrophy. There is considerable potential for neural compression on either side at this level.  2. L4-5: Severe spinal stenosis because of broad base herniation of disc material in combination with facet and ligamentous hypertrophy. Stenosis appears particularly pronounced in the lateral recesses, left more than right. Nerve root compression could be occurring on either side at this level.  3. L5-S1: Bilateral subarticular lateral recess and neural foraminal narrowing because of broad base herniation of disc material in combination with facet and ligamentous hypertrophy. Compromise is particularly pronounced in the left lateral recess to foraminal region.  4. L2-3: Shallow circumferential protrusion of disc material focally prominent in the right extraforaminal region, not likely significant in this clinical setting.    Past Medical History  Diagnosis Date  . Diabetes mellitus   . Hyperlipidemia   . Anemia   . Hypertension   . Allergy   . Pituitary abnormality 2004  . OA (osteoarthritis)   . Elevated PSA   . Hypogonadism male     Past Surgical History  Procedure Laterality Date  . Spine surgery    . Joint replacement    . Transphenoidal / transnasal hypophysectomy / resection pituitary tumor  2005  . Electrocardiogram  03/25/2006  . Doppler echocardiography  12/04/2001      Medications:  Current Outpatient Prescriptions on File Prior to Visit  Medication Sig Dispense Refill  . aspirin 81 MG tablet Take 81 mg by mouth daily.      Marland Kitchen atorvastatin (LIPITOR) 80 MG tablet TAKE 1 TABLET DAILY  90 tablet  3  . calcium carbonate (OS-CAL) 600 MG TABS Take 600 mg by mouth daily.      . cetirizine (ZYRTEC) 10 MG tablet Take 10 mg by mouth daily.      . Cholecalciferol (VITAMIN D3 PO) Take 1 capsule by mouth daily. 652mcg      . finasteride (PROSCAR) 5 MG tablet Take 5 mg by mouth daily.        . fish oil-omega-3 fatty acids 1000 MG capsule Take 1 g by mouth daily.      . fludrocortisone (FLORINEF) 0.1 MG tablet TAKE 1 TABLET DAILY  90 tablet  3  . folic acid (FOLVITE) 1 MG tablet TAKE 1 TABLET DAILY  90 tablet  3  . HYDROcodone-acetaminophen (NORCO) 10-325 MG per tablet Take 1 tablet by mouth every 6 (six) hours as needed for pain.  30 tablet  0  . KLOR-CON M20 20 MEQ tablet       . meloxicam (MOBIC) 15 MG tablet Take 1 tablet (15 mg total) by mouth daily.  30 tablet  8  . metFORMIN (GLUCOPHAGE-XR) 500 MG 24 hr tablet TAKE 4 TABLETS ONCE DAILY  360 tablet  3  . Multiple Vitamins-Minerals (CENTRUM SILVER PO) Take 1 tablet by mouth daily.        . ondansetron (ZOFRAN) 8 MG tablet Take 1 tablet (8 mg total) by mouth every 4 (four) hours as needed for nausea.  10 tablet  0  . pantoprazole (PROTONIX) 20 MG tablet Take 1 tablet (20 mg total) by mouth daily.  30 tablet  5  . pioglitazone (ACTOS) 45 MG tablet TAKE 1 TABLET DAILY  90 tablet  2  . Tamsulosin HCl (FLOMAX) 0.4 MG CAPS Take 0.4 mg by mouth daily.         No current facility-administered medications on file prior to visit.    Allergies: No Known Allergies  Family History: Family History  Problem Relation Age of Onset  . Cancer Brother     uncertain type    Social History: History   Social History  . Marital Status: Married    Spouse Name: N/A    Number of Children: 2  . Years of Education: N/A    Occupational History  . retired    Social History Main Topics  . Smoking status: Former Smoker    Quit date: 02/22/1957  . Smokeless tobacco: Never Used  . Alcohol Use: No  . Drug Use: No  . Sexual Activity: Not Currently   Other Topics Concern  . Not on file   Social History Narrative  . No narrative on file    Review of Systems:  CONSTITUTIONAL: No fevers, chills, night sweats, or weight loss.   EYES: No visual changes or eye pain ENT: No hearing changes.  No history of nose bleeds.   RESPIRATORY: No cough, wheezing and shortness of breath.   CARDIOVASCULAR: Negative for chest pain, and palpitations.   GI: Negative for abdominal discomfort, blood  in stools or black stools.  No recent change in bowel habits.   GU:  No history of incontinence.   MUSCLOSKELETAL: + history of joint pain or swelling.  No myalgias.   SKIN: Negative for lesions, rash, and itching.   HEMATOLOGY/ONCOLOGY: Negative for prolonged bleeding, bruising easily, and swollen nodes.    ENDOCRINE: Negative for cold or heat intolerance, polydipsia or goiter.   PSYCH:  No depression or anxiety symptoms.   NEURO: As Above.   Vital Signs:  BP 124/60  Pulse 66  Resp 16  Ht 6' 3.5" (1.918 m)  Wt 227 lb (102.967 kg)  BMI 27.99 kg/m2  Neurological Exam: MENTAL STATUS including orientation to time, place, person, recent and remote memory, attention span and concentration, language, and fund of knowledge is normal.  Speech is not dysarthric.  CRANIAL NERVES: II:  No visual field defects.  Unremarkable fundi.   III-IV-VI: Pupils equal round and reactive to light.  Normal conjugate, extra-ocular eye movements in all directions of gaze.  No nystagmus.  No ptosis.   V:  Normal facial sensation.    VII:  Normal facial symmetry and movements.   VIII:  Normal hearing and vestibular function.   IX-X:  Normal palatal movement.   XI:  Normal shoulder shrug and head rotation.   XII:  Normal tongue strength and  range of motion, no deviation or fasciculation.  MOTOR:  No fasciculations or abnormal movements.  No pronator drift.  Tone is normal.    Right Upper Extremity:    Left Upper Extremity:    Deltoid  5/5   Deltoid  5/5   Biceps  5/5   Biceps  5/5   Triceps  5/5   Triceps  5/5   Wrist extensors  5/5   Wrist extensors  5/5   Wrist flexors  5/5   Wrist flexors  5/5   Finger extensors  5/5   Finger extensors  5/5   Finger flexors  5/5   Finger flexors  5/5   Dorsal interossei  5/5   Dorsal interossei  5/5   Abductor pollicis  5/5   Abductor pollicis  5/5   Tone (Ashworth scale)  0  Tone (Ashworth scale)  0   Right Lower Extremity:    Left Lower Extremity:    Hip flexors  5/5   Hip flexors  5/5   Hip extensors  5/5   Hip extensors  5/5   Knee flexors  5/5   Knee flexors  5/5   Knee extensors  5/5   Knee extensors  5/5   Dorsiflexors  2/5   Dorsiflexors  5/5   Plantarflexors  5/5   Plantarflexors  5/5   Toe extensors  2/5   Toe extensors  5/5   Toe flexors  5/5   Toe flexors  5/5   Tone (Ashworth scale)  0  Tone (Ashworth scale)  0   MSRs:  Right                                                                 Left brachioradialis 2+  brachioradialis 2+  biceps 2+  biceps 2+  triceps 2+  triceps 2+  patellar 1+  patellar 3+  ankle jerk 1+  ankle jerk  2+  Hoffman no  Hoffman no  plantar response down  plantar response down   SENSORY:  Normal and symmetric perception of light touch, pinprick, vibration, and proprioception.  Romberg's sign absent.   COORDINATION/GAIT: Normal finger-to- nose-finger and heel-to-shin.  Intact rapid alternating movements bilaterally.  Able to rise from a chair without using arms.  High steppage gait on the right, otherwise gait is narrow based and stable.   IMPRESSION: Joseph Rocha is a 79 year-old gentleman presenting for evaluation of muscle cramps.  His exam is notable for right foot drop (residual from previous lumbar stenosis s/p back surgery).  There  is no evidence of fasciculations or dystonia on exam today.  His history and exam is most consistent with benign nocturnal leg cramps.  Statin induced myopathy/myalgias is also possible given history of elevated CK in 2014. He recently stopped his Lipitor after learning that this can cause his muscle cramps and myalgias, which is reasonable.  Although motor neuron disease is in the differential, the lack of substantial clinical findings makes this less likely.    I will screen for treatable causes of muscle cramps as well as myopathy.   From a symptomatic standpoint, his cramps are well-controlled on Robaxin so I've asked him to continue this. If his muscle cramps worsen, alternative medications can be tried and additional workup with EMG may be warranted.   PLAN/RECOMMENDATIONS:  1.  Check PTH, TSH, Mg, CK, aldolase, CMP, vitamin B12 2.  Encouraged to stay well hydrated 3.  Continue to take methocarbamol 500mg  at bedtime as needed 4.  Two month trial off of statins to see if this helps his symptoms 5.  Instructions provided to perform leg stretches at bedtime 6.  Return to clinic in 2 months, or sooner as needed   The duration of this appointment visit was 50 minutes of face-to-face time with the patient.  Greater than 50% of this time was spent in counseling, explanation of diagnosis, planning of further management, and coordination of care.   Thank you for allowing me to participate in patient's care.  If I can answer any additional questions, I would be pleased to do so.    Sincerely,    Emmersen Garraway K. Posey Pronto, DO

## 2014-01-30 LAB — COMPLETE METABOLIC PANEL WITH GFR
ALBUMIN: 3.5 g/dL (ref 3.5–5.2)
ALT: 13 U/L (ref 0–53)
AST: 21 U/L (ref 0–37)
Alkaline Phosphatase: 47 U/L (ref 39–117)
BUN: 23 mg/dL (ref 6–23)
CALCIUM: 8.9 mg/dL (ref 8.4–10.5)
CHLORIDE: 102 meq/L (ref 96–112)
CO2: 31 meq/L (ref 19–32)
CREATININE: 1.2 mg/dL (ref 0.50–1.35)
GFR, EST AFRICAN AMERICAN: 67 mL/min
GFR, EST NON AFRICAN AMERICAN: 58 mL/min — AB
GLUCOSE: 84 mg/dL (ref 70–99)
Potassium: 3.9 mEq/L (ref 3.5–5.3)
Sodium: 140 mEq/L (ref 135–145)
Total Bilirubin: 0.5 mg/dL (ref 0.2–1.2)
Total Protein: 6.1 g/dL (ref 6.0–8.3)

## 2014-01-30 LAB — MAGNESIUM: Magnesium: 1.2 mg/dL — ABNORMAL LOW (ref 1.5–2.5)

## 2014-01-30 LAB — TSH: TSH: 4.213 u[IU]/mL (ref 0.350–4.500)

## 2014-01-30 LAB — VITAMIN B12: VITAMIN B 12: 517 pg/mL (ref 211–911)

## 2014-01-30 LAB — CK: CK TOTAL: 198 U/L (ref 7–232)

## 2014-02-01 LAB — PARATHYROID HORMONE, INTACT (NO CA): PTH: 20.7 pg/mL (ref 14.0–72.0)

## 2014-02-01 NOTE — Progress Notes (Signed)
Notes faxed.

## 2014-02-02 DIAGNOSIS — J309 Allergic rhinitis, unspecified: Secondary | ICD-10-CM | POA: Diagnosis not present

## 2014-02-02 LAB — ALDOLASE: ALDOLASE: 3.6 U/L (ref <=8.1)

## 2014-02-04 DIAGNOSIS — J309 Allergic rhinitis, unspecified: Secondary | ICD-10-CM | POA: Diagnosis not present

## 2014-02-11 DIAGNOSIS — M25569 Pain in unspecified knee: Secondary | ICD-10-CM | POA: Diagnosis not present

## 2014-02-11 DIAGNOSIS — IMO0002 Reserved for concepts with insufficient information to code with codable children: Secondary | ICD-10-CM | POA: Diagnosis not present

## 2014-02-16 DIAGNOSIS — M25569 Pain in unspecified knee: Secondary | ICD-10-CM | POA: Diagnosis not present

## 2014-02-16 DIAGNOSIS — J309 Allergic rhinitis, unspecified: Secondary | ICD-10-CM | POA: Diagnosis not present

## 2014-02-25 DIAGNOSIS — J309 Allergic rhinitis, unspecified: Secondary | ICD-10-CM | POA: Diagnosis not present

## 2014-02-25 DIAGNOSIS — M25569 Pain in unspecified knee: Secondary | ICD-10-CM | POA: Diagnosis not present

## 2014-03-02 ENCOUNTER — Ambulatory Visit (INDEPENDENT_AMBULATORY_CARE_PROVIDER_SITE_OTHER): Payer: Medicare Other | Admitting: Endocrinology

## 2014-03-02 ENCOUNTER — Ambulatory Visit (INDEPENDENT_AMBULATORY_CARE_PROVIDER_SITE_OTHER): Payer: Medicare Other | Admitting: Interventional Cardiology

## 2014-03-02 ENCOUNTER — Encounter: Payer: Self-pay | Admitting: Interventional Cardiology

## 2014-03-02 ENCOUNTER — Encounter: Payer: Self-pay | Admitting: Endocrinology

## 2014-03-02 VITALS — BP 122/64 | HR 61 | Temp 97.9°F | Ht 75.5 in | Wt 226.0 lb

## 2014-03-02 VITALS — BP 118/74 | HR 63 | Ht 75.0 in | Wt 223.0 lb

## 2014-03-02 DIAGNOSIS — R55 Syncope and collapse: Secondary | ICD-10-CM | POA: Diagnosis not present

## 2014-03-02 DIAGNOSIS — I495 Sick sinus syndrome: Secondary | ICD-10-CM

## 2014-03-02 DIAGNOSIS — Z01810 Encounter for preprocedural cardiovascular examination: Secondary | ICD-10-CM | POA: Diagnosis not present

## 2014-03-02 DIAGNOSIS — J309 Allergic rhinitis, unspecified: Secondary | ICD-10-CM | POA: Diagnosis not present

## 2014-03-02 DIAGNOSIS — E119 Type 2 diabetes mellitus without complications: Secondary | ICD-10-CM

## 2014-03-02 LAB — HEMOGLOBIN A1C: HEMOGLOBIN A1C: 5.3 % (ref 4.6–6.5)

## 2014-03-02 MED ORDER — ROSUVASTATIN CALCIUM 10 MG PO TABS: 10.0000 mg | ORAL_TABLET | Freq: Every day | ORAL | Status: DC

## 2014-03-02 NOTE — Progress Notes (Signed)
Subjective:    Patient ID: Joseph Rocha, male    DOB: Jul 18, 1935, 79 y.o.   MRN: [id number]  HPI Pt returns for f/u of type 2 DM (dx'ed 2009; he has mild if any neuropathy of the lower extremities; no known associated chronic complications).  no cbg record, but states cbg's are well-controlled.   Syncope: no recent problems with this.   Myalgias are better off lipitor.   Past Medical History  Diagnosis Date  . Diabetes mellitus   . Hyperlipidemia   . Anemia   . Hypertension   . Allergy   . Pituitary abnormality 2004  . OA (osteoarthritis)   . Elevated PSA   . Hypogonadism male     Past Surgical History  Procedure Laterality Date  . Spine surgery    . Joint replacement    . Transphenoidal / transnasal hypophysectomy / resection pituitary tumor  2005  . Electrocardiogram  03/25/2006  . Doppler echocardiography  12/04/2001    History   Social History  . Marital Status: Married    Spouse Name: N/A    Number of Children: 2  . Years of Education: N/A   Occupational History  . retired    Social History Main Topics  . Smoking status: Former Smoker    Quit date: 02/22/1957  . Smokeless tobacco: Never Used  . Alcohol Use: No  . Drug Use: No  . Sexual Activity: Not Currently   Other Topics Concern  . Not on file   Social History Narrative   Lives with wife.  They have two grown children.   He is retired from the [employer].    Current Outpatient Prescriptions on File Prior to Visit  Medication Sig Dispense Refill  . aspirin 81 MG tablet Take 81 mg by mouth daily.      Marland Kitchen atorvastatin (LIPITOR) 80 MG tablet TAKE 1 TABLET DAILY  90 tablet  3  . calcium carbonate (OS-CAL) 600 MG TABS Take 600 mg by mouth daily.      . cetirizine (ZYRTEC) 10 MG tablet Take 10 mg by mouth daily.      . Cholecalciferol (VITAMIN D3 PO) Take 1 capsule by mouth daily. 644mcg      . finasteride (PROSCAR) 5 MG tablet Take 5 mg by mouth daily.        . fish oil-omega-3 fatty acids  1000 MG capsule Take 1 g by mouth daily.      . fludrocortisone (FLORINEF) 0.1 MG tablet TAKE 1 TABLET DAILY  90 tablet  3  . folic acid (FOLVITE) 1 MG tablet TAKE 1 TABLET DAILY  90 tablet  3  . HYDROcodone-acetaminophen (NORCO) 10-325 MG per tablet Take 1 tablet by mouth every 6 (six) hours as needed for pain.  30 tablet  0  . KLOR-CON M20 20 MEQ tablet       . meloxicam (MOBIC) 15 MG tablet Take 1 tablet (15 mg total) by mouth daily.  30 tablet  8  . metFORMIN (GLUCOPHAGE-XR) 500 MG 24 hr tablet TAKE 4 TABLETS ONCE DAILY  360 tablet  3  . methocarbamol (ROBAXIN) 500 MG tablet Take 500 mg by mouth at bedtime as needed for muscle spasms.      . Multiple Vitamins-Minerals (CENTRUM SILVER PO) Take 1 tablet by mouth daily.        . ondansetron (ZOFRAN) 8 MG tablet Take 1 tablet (8 mg total) by mouth every 4 (four) hours as needed for nausea.  10 tablet  0  . pantoprazole (PROTONIX) 20 MG tablet Take 1 tablet (20 mg total) by mouth daily.  30 tablet  5  . pioglitazone (ACTOS) 45 MG tablet TAKE 1 TABLET DAILY  90 tablet  2  . Tamsulosin HCl (FLOMAX) 0.4 MG CAPS Take 0.4 mg by mouth daily.        . Testosterone (AXIRON) 30 MG/ACT SOLN Place onto the skin.       No current facility-administered medications on file prior to visit.    Allergies  Allergen Reactions  . Lipitor [Atorvastatin]     myalgias    Family History  Problem Relation Age of Onset  . Cancer Brother     uncertain type  . Diabetes Mellitus II Mother     Deceased, 87s  . Heart attack Father   . Healthy Daughter     BP 122/64  Pulse 61  Temp(Src) 97.9 F (36.6 C) (Oral)  Ht 6' 3.5" (1.918 m)  Wt 226 lb (102.513 kg)  BMI 27.87 kg/m2  SpO2 99%  Review of Systems Denies weight change and dizziness.    Objective:   Physical Exam VITAL SIGNS:  See vs page GENERAL: no distress.  Lab Results  Component Value Date   HGBA1C 5.3 03/02/2014      Assessment & Plan:  DM: well-controlled. Dyslipidemia: therapy is  limited by drug intolerance. H/o syncope: none recently.

## 2014-03-02 NOTE — Progress Notes (Addendum)
Patient ID: Joseph Rocha, male   DOB: [birth year], 79 y.o.   MRN: [id number]    [address], [place]  [address] Phone: [phone] Fax:  [phone]  Date:  03/02/2014   ID:  Joseph Rocha, DOB 1935-01-16, MRN [id number]  PCP:  Renato Shin, MD   ASSESSMENT:  1. Mild sinoatrial node dysfunction, with sinus rhythm at 61 beats per minute this morning 2. Blood pressure is in good range. He tends to have hypotension 3. History of recurrent syncope, no recent episode 4. Preoperative cardiovascular exam  PLAN:  1. No testing or change in therapy 2. Cleared for upcoming arthroscopic procedure 3. Clinical followup in one year   SUBJECTIVE: Joseph Rocha is a 79 y.o. male who has no cardiovascular complaints. He has not fainted since last office visit. He does have an upcoming right knee arthroscopic procedure by Dr. Veverly Fells. He denies angina, orthopnea, PND, palpitations, and edema.   Wt Readings from Last 3 Encounters:  03/02/14 223 lb (101.152 kg)  01/29/14 227 lb (102.967 kg)  09/23/13 221 lb 12.8 oz (100.608 kg)     Past Medical History  Diagnosis Date  . Diabetes mellitus   . Hyperlipidemia   . Anemia   . Hypertension   . Allergy   . Pituitary abnormality 2004  . OA (osteoarthritis)   . Elevated PSA   . Hypogonadism male     Current Outpatient Prescriptions  Medication Sig Dispense Refill  . aspirin 81 MG tablet Take 81 mg by mouth daily.      Marland Kitchen atorvastatin (LIPITOR) 80 MG tablet TAKE 1 TABLET DAILY  90 tablet  3  . calcium carbonate (OS-CAL) 600 MG TABS Take 600 mg by mouth daily.      . cetirizine (ZYRTEC) 10 MG tablet Take 10 mg by mouth daily.      . Cholecalciferol (VITAMIN D3 PO) Take 1 capsule by mouth daily. 633mcg      . finasteride (PROSCAR) 5 MG tablet Take 5 mg by mouth daily.        . fish oil-omega-3 fatty acids 1000 MG capsule Take 1 g by mouth daily.      . fludrocortisone (FLORINEF) 0.1 MG tablet TAKE 1 TABLET  DAILY  90 tablet  3  . folic acid (FOLVITE) 1 MG tablet TAKE 1 TABLET DAILY  90 tablet  3  . HYDROcodone-acetaminophen (NORCO) 10-325 MG per tablet Take 1 tablet by mouth every 6 (six) hours as needed for pain.  30 tablet  0  . KLOR-CON M20 20 MEQ tablet       . meloxicam (MOBIC) 15 MG tablet Take 1 tablet (15 mg total) by mouth daily.  30 tablet  8  . metFORMIN (GLUCOPHAGE-XR) 500 MG 24 hr tablet TAKE 4 TABLETS ONCE DAILY  360 tablet  3  . methocarbamol (ROBAXIN) 500 MG tablet Take 500 mg by mouth at bedtime as needed for muscle spasms.      . Multiple Vitamins-Minerals (CENTRUM SILVER PO) Take 1 tablet by mouth daily.        . ondansetron (ZOFRAN) 8 MG tablet Take 1 tablet (8 mg total) by mouth every 4 (four) hours as needed for nausea.  10 tablet  0  . pantoprazole (PROTONIX) 20 MG tablet Take 1 tablet (20 mg total) by mouth daily.  30 tablet  5  . pioglitazone (ACTOS) 45 MG tablet TAKE 1 TABLET DAILY  90 tablet  2  . Tamsulosin  HCl (FLOMAX) 0.4 MG CAPS Take 0.4 mg by mouth daily.        . Testosterone (AXIRON) 30 MG/ACT SOLN Place onto the skin.       No current facility-administered medications for this visit.    Allergies:   No Known Allergies  Social History:  The patient  reports that he quit smoking about 57 years ago. He has never used smokeless tobacco. He reports that he does not drink alcohol or use illicit drugs.   ROS:  Please see the history of present illness.   All other systems reviewed and negative.   OBJECTIVE: VS:  BP 118/74  Pulse 63  Ht 6\' 3"  (1.905 m)  Wt 223 lb (101.152 kg)  BMI 27.87 kg/m2 Well nourished, well developed, in no acute distress, slender an elderly HEENT: normal Neck: JVD flat. Carotid bruit 2+  Cardiac:  normal S1, S2; RRR; no murmur Lungs:  clear to auscultation bilaterally, no wheezing, rhonchi or rales Abd: soft, nontender, no hepatomegaly Ext: Edema absent. Pulses 2+ Skin: warm and dry Neuro:  CNs 2-12 intact, no focal abnormalities  noted  EKG: Sinus bradycardia with nonspecific T-wave abnormality    Signed, Illene Labrador III, MD 03/02/2014 8:52 AM

## 2014-03-02 NOTE — Patient Instructions (Addendum)
blood tests are being requested for you today.  We'll contact you with results.   your surgical risk is low and outweighed by the potential benefit of the surgery.  You are therefore medically cleared.   i have sent a prescription to your pharmacy, for a replacement for the lipitor.

## 2014-03-02 NOTE — Patient Instructions (Signed)
Your physician recommends that you continue on your current medications as directed. Please refer to the Current Medication list given to you today.  Your physician wants you to follow-up in: 1 year. You will receive a reminder letter in the mail two months in advance. If you don't receive a letter, please call our office to schedule the follow-up appointment.  

## 2014-03-04 DIAGNOSIS — J309 Allergic rhinitis, unspecified: Secondary | ICD-10-CM | POA: Diagnosis not present

## 2014-03-05 HISTORY — PX: MENISCUS REPAIR: SHX5179

## 2014-03-10 DIAGNOSIS — J309 Allergic rhinitis, unspecified: Secondary | ICD-10-CM | POA: Diagnosis not present

## 2014-03-16 DIAGNOSIS — J309 Allergic rhinitis, unspecified: Secondary | ICD-10-CM | POA: Diagnosis not present

## 2014-03-17 ENCOUNTER — Encounter: Payer: Self-pay | Admitting: Endocrinology

## 2014-03-17 DIAGNOSIS — M2390 Unspecified internal derangement of unspecified knee: Secondary | ICD-10-CM | POA: Diagnosis not present

## 2014-03-17 DIAGNOSIS — M23302 Other meniscus derangements, unspecified lateral meniscus, unspecified knee: Secondary | ICD-10-CM | POA: Diagnosis not present

## 2014-03-17 DIAGNOSIS — M659 Synovitis and tenosynovitis, unspecified: Secondary | ICD-10-CM | POA: Diagnosis not present

## 2014-03-17 DIAGNOSIS — M9420 Chondromalacia, unspecified site: Secondary | ICD-10-CM | POA: Diagnosis not present

## 2014-03-17 DIAGNOSIS — M23329 Other meniscus derangements, posterior horn of medial meniscus, unspecified knee: Secondary | ICD-10-CM | POA: Diagnosis not present

## 2014-03-17 DIAGNOSIS — G8918 Other acute postprocedural pain: Secondary | ICD-10-CM | POA: Diagnosis not present

## 2014-03-17 DIAGNOSIS — M2240 Chondromalacia patellae, unspecified knee: Secondary | ICD-10-CM | POA: Diagnosis not present

## 2014-03-25 DIAGNOSIS — J309 Allergic rhinitis, unspecified: Secondary | ICD-10-CM | POA: Diagnosis not present

## 2014-04-02 DIAGNOSIS — J309 Allergic rhinitis, unspecified: Secondary | ICD-10-CM | POA: Diagnosis not present

## 2014-04-02 DIAGNOSIS — M25569 Pain in unspecified knee: Secondary | ICD-10-CM | POA: Diagnosis not present

## 2014-04-08 DIAGNOSIS — M25569 Pain in unspecified knee: Secondary | ICD-10-CM | POA: Diagnosis not present

## 2014-04-08 DIAGNOSIS — J309 Allergic rhinitis, unspecified: Secondary | ICD-10-CM | POA: Diagnosis not present

## 2014-04-12 ENCOUNTER — Other Ambulatory Visit: Payer: Self-pay | Admitting: Endocrinology

## 2014-04-12 DIAGNOSIS — M25569 Pain in unspecified knee: Secondary | ICD-10-CM | POA: Diagnosis not present

## 2014-04-13 ENCOUNTER — Ambulatory Visit: Payer: Medicare Other | Admitting: Neurology

## 2014-04-15 DIAGNOSIS — J309 Allergic rhinitis, unspecified: Secondary | ICD-10-CM | POA: Diagnosis not present

## 2014-04-15 DIAGNOSIS — M25569 Pain in unspecified knee: Secondary | ICD-10-CM | POA: Diagnosis not present

## 2014-04-20 DIAGNOSIS — M25569 Pain in unspecified knee: Secondary | ICD-10-CM | POA: Diagnosis not present

## 2014-04-20 DIAGNOSIS — J309 Allergic rhinitis, unspecified: Secondary | ICD-10-CM | POA: Diagnosis not present

## 2014-04-22 DIAGNOSIS — J309 Allergic rhinitis, unspecified: Secondary | ICD-10-CM | POA: Diagnosis not present

## 2014-04-28 ENCOUNTER — Ambulatory Visit: Payer: Medicare Other | Admitting: Endocrinology

## 2014-04-28 DIAGNOSIS — J309 Allergic rhinitis, unspecified: Secondary | ICD-10-CM | POA: Diagnosis not present

## 2014-04-28 DIAGNOSIS — J301 Allergic rhinitis due to pollen: Secondary | ICD-10-CM | POA: Diagnosis not present

## 2014-04-28 DIAGNOSIS — J3089 Other allergic rhinitis: Secondary | ICD-10-CM | POA: Diagnosis not present

## 2014-05-12 ENCOUNTER — Ambulatory Visit (INDEPENDENT_AMBULATORY_CARE_PROVIDER_SITE_OTHER): Payer: Medicare Other | Admitting: Neurology

## 2014-05-12 ENCOUNTER — Encounter: Payer: Self-pay | Admitting: Neurology

## 2014-05-12 VITALS — BP 108/78 | HR 72 | Ht 74.8 in | Wt 223.2 lb

## 2014-05-12 DIAGNOSIS — G4762 Sleep related leg cramps: Secondary | ICD-10-CM | POA: Diagnosis not present

## 2014-05-12 DIAGNOSIS — J301 Allergic rhinitis due to pollen: Secondary | ICD-10-CM | POA: Diagnosis not present

## 2014-05-12 DIAGNOSIS — M7989 Other specified soft tissue disorders: Secondary | ICD-10-CM | POA: Diagnosis not present

## 2014-05-12 NOTE — Progress Notes (Signed)
Follow-up Visit   Date: 05/12/2014    ADMIRAL MARCUCCI MRN: [id number] DOB: 08-10-1935   Interim History: Joseph Rocha is a 79 y.o. right-handed African American male with history of hyperlipidemia, diabetes mellitus, GERD, seasonal allergies, lumbar stenosis s/p back surgery, pituitary adenoma s/p resection, and BPH returning to the clinic for follow-up of muscle cramps.  The patient was accompanied to the clinic by wife who also provides collateral information.    History of present illness: For the past 5 years, he has noticed muscle cramps involving his legs. Over the past 93-months, cramps started occuring in the hands as well. They occur about 1-2 times per week. Exertion can worsen cramps. He has tried putting soap under his sheets and said it may help. He saw his PCP who started robaxin as needed which has significantly helped. He has mild weakness with climbing stairs. He was evaluated by Dr. Vertell Limber on in February who has referred him to neurology for further evaluation as his symptoms are likely unrelated to his cervical spine stenosis.  UPDATE 05/12/2014:  At his last visit, labs indicated that his magnesium was low, so he was started on MgO 400mg  BID.  He also stopped lipitor and switched to crestor.  He has noticed significant improvement in his cramps of his hands, with occasional cramps involving the legs.  He arrived back from a cruise trip on Sunday and since getting back.  He also underwent right knee arthroscopy and since has developed right leg and ankle swelling.  Denies any shortness of breath.     Medications:  Current Outpatient Prescriptions on File Prior to Visit  Medication Sig Dispense Refill  . aspirin 81 MG tablet Take 81 mg by mouth daily.      Marland Kitchen atorvastatin (LIPITOR) 80 MG tablet TAKE 1 TABLET DAILY  90 tablet  3  . calcium carbonate (OS-CAL) 600 MG TABS Take 600 mg by mouth daily.      . cetirizine (ZYRTEC) 10 MG tablet Take 10 mg by mouth daily.        . Cholecalciferol (VITAMIN D3 PO) Take 1 capsule by mouth daily. 663mcg      . finasteride (PROSCAR) 5 MG tablet Take 5 mg by mouth daily.        . fish oil-omega-3 fatty acids 1000 MG capsule Take 1 g by mouth daily.      . fludrocortisone (FLORINEF) 0.1 MG tablet TAKE 1 TABLET DAILY  90 tablet  3  . folic acid (FOLVITE) 1 MG tablet TAKE 1 TABLET DAILY  90 tablet  3  . HYDROcodone-acetaminophen (NORCO) 10-325 MG per tablet Take 1 tablet by mouth every 6 (six) hours as needed for pain.  30 tablet  0  . KLOR-CON M20 20 MEQ tablet       . meloxicam (MOBIC) 15 MG tablet Take 1 tablet (15 mg total) by mouth daily.  30 tablet  8  . metFORMIN (GLUCOPHAGE-XR) 500 MG 24 hr tablet TAKE 4 TABLETS ONCE DAILY  360 tablet  3  . methocarbamol (ROBAXIN) 500 MG tablet Take 500 mg by mouth at bedtime as needed for muscle spasms.      . Multiple Vitamins-Minerals (CENTRUM SILVER PO) Take 1 tablet by mouth daily.        . ondansetron (ZOFRAN) 8 MG tablet Take 1 tablet (8 mg total) by mouth every 4 (four) hours as needed for nausea.  10 tablet  0  . pantoprazole (PROTONIX) 20 MG tablet Take  1 tablet (20 mg total) by mouth daily.  30 tablet  5  . pioglitazone (ACTOS) 45 MG tablet TAKE 1 TABLET DAILY  90 tablet  2  . rosuvastatin (CRESTOR) 10 MG tablet Take 1 tablet (10 mg total) by mouth daily.  90 tablet  3  . Tamsulosin HCl (FLOMAX) 0.4 MG CAPS Take 0.4 mg by mouth daily.        . Testosterone (AXIRON) 30 MG/ACT SOLN Place onto the skin.       No current facility-administered medications on file prior to visit.    Allergies:  Allergies  Allergen Reactions  . Lipitor [Atorvastatin]     myalgias     Review of Systems:  CONSTITUTIONAL: No fevers, chills, night sweats, or weight loss.   EYES: No visual changes or eye pain ENT: No hearing changes.  No history of nose bleeds.   RESPIRATORY: No cough, wheezing and shortness of breath.   CARDIOVASCULAR: Negative for chest pain, and palpitations +swelling.    GI: Negative for abdominal discomfort, blood in stools or black stools.  No recent change in bowel habits.   GU:  No history of incontinence.   MUSCLOSKELETAL: No history of joint pain or swelling.  No myalgias.   SKIN: Negative for lesions, rash, and itching.   ENDOCRINE: Negative for cold or heat intolerance, polydipsia or goiter.   PSYCH:  No depression or anxiety symptoms.   NEURO: As Above.   Vital Signs:  BP 108/78  Pulse 72  Ht 6' 2.8" (1.9 m)  Wt 223 lb 4 oz (101.266 kg)  BMI 28.05 kg/m2  SpO2 98%  Neurological Exam: MENTAL STATUS including orientation to time, place, person, recent and remote memory, attention span and concentration, language, and fund of knowledge is normal.  Speech is not dysarthric.  CRANIAL NERVES:  Pupils equal round and reactive to light.  Normal conjugate, extra-ocular eye movements in all directions of gaze.  No ptosis. Normal facial sensation.  Face is symmetric. Palate elevates symmetrically.  Tongue is midline.  MOTOR:  Motor strength is 5/5 in all extremities, except 2/5 right tibialis anterior and right toe extension.   Right leg with edema of the lower leg and ankle.  Leg is warm and pedal pulses are palpable.  MSRs:  Right      Left  brachioradialis  2+   brachioradialis  2+   biceps  2+   biceps  2+   triceps  2+   triceps  2+   patellar  1+   patellar  2  ankle jerk  1+   ankle jerk  2+   Hoffman  no   Hoffman  no   plantar response  down   plantar response  down    SENSORY:  Intact to vibration throughout.  COORDINATION/GAIT:  High steppage gait on the right, otherwise gait is narrow based and stable  Data: MRI cervical spine 06/04/2013:  1. C2-C3 facet arthropathy on the left appears increased since 01/21/2012, but other cervical spine findings appear stable since that time.  2. Multifactorial cervical spinal stenosis with predominately mild spinal cord mass effect C2-C3 through C6-C7. No associated spinal cord signal abnormality.   3. Multifactorial severe cervical foraminal stenosis is stable at the left C3, bilateral C5, bilateral C6 and bilateral C7 nerve levels.   MRI brain 01/21/2012:  1. Stable postoperative appearance of the sella turcica with no adverse features.  2. No acute intracranial abnormality.   MRI lumbar spine wwo contrast 08/06/2006:  1. L3-4: Severe multifactorial stenosis because of broad base herniation of disc material in combination with facet and ligamentous hypertrophy. There is considerable potential for neural compression on either side at this level.  2. L4-5: Severe spinal stenosis because of broad base herniation of disc material in combination with facet and ligamentous hypertrophy. Stenosis appears particularly pronounced in the lateral recesses, left more than right. Nerve root compression could be occurring on either side at this level.  3. L5-S1: Bilateral subarticular lateral recess and neural foraminal narrowing because of broad base herniation of disc material in combination with facet and ligamentous hypertrophy. Compromise is particularly pronounced in the left lateral recess to foraminal region.  4. L2-3: Shallow circumferential protrusion of disc material focally prominent in the right extraforaminal region, not likely significant in this clinical setting.    Labs 01/29/2014: Magnesium 1.2*, potassium 3.9, AST 21, ALT 15, creatinine 1.2, aldolase 3.6, CK 198, TSH 4.2, PTH 20.7, vitamin B12 517    IMPRESSION/PLAN: 1.  Leg cramps  - Clinically improved after starting magnesium supplements and switching from lipitor to crestor  - Etiology likely due to low magnesium, however, cannot exclude statin-induced mylagias from lipitor.  Fortunately, he seems to be tolerating Crestor 10mg  daily  - Continue magnesium oxide 400 mg twice daily  2.  Right foot drop (residual from previous lumbar stenosis s/p back surgery)  - Stable  3.  Right leg and ankle swelling following knee  arthroscopy  - Likely related to recent knee arthroscopy, less likely DVT  - Follow-up with PCP or orthopaedic surgeon  4.  Return to clinic as needed    The duration of this appointment visit was 20 minutes of face-to-face time with the patient.  Greater than 50% of this time was spent in counseling, explanation of diagnosis, planning of further management, and coordination of care.   Thank you for allowing me to participate in patient's care.  If I can answer any additional questions, I would be pleased to do so.    Sincerely,    Yohann Curl K. Posey Pronto, DO

## 2014-05-12 NOTE — Patient Instructions (Addendum)
1.  Continue magnesium oxide 400 mg twice daily  2.  Patient has been instructed to follow-up with PCP or orthopaedic surgeon for leg swelling  3.  Return to clinic as needed

## 2014-05-14 ENCOUNTER — Telehealth: Payer: Self-pay | Admitting: Neurology

## 2014-05-14 ENCOUNTER — Other Ambulatory Visit: Payer: Self-pay | Admitting: *Deleted

## 2014-05-14 MED ORDER — MAGNESIUM OXIDE -MG SUPPLEMENT 400 (240 MG) MG PO TABS: 1.0000 | ORAL_TABLET | Freq: Two times a day (BID) | ORAL | Status: DC

## 2014-05-14 NOTE — Telephone Encounter (Signed)
Pt called requesting a refill. Please call pt [phone]

## 2014-05-14 NOTE — Telephone Encounter (Signed)
Patient called requesting Rx for magnesium oxide 400 mg bid.  90 day supply.  Rx sent to [facility].

## 2014-05-19 DIAGNOSIS — J309 Allergic rhinitis, unspecified: Secondary | ICD-10-CM | POA: Diagnosis not present

## 2014-05-25 DIAGNOSIS — E291 Testicular hypofunction: Secondary | ICD-10-CM | POA: Diagnosis not present

## 2014-05-25 DIAGNOSIS — N401 Enlarged prostate with lower urinary tract symptoms: Secondary | ICD-10-CM | POA: Diagnosis not present

## 2014-05-26 DIAGNOSIS — J309 Allergic rhinitis, unspecified: Secondary | ICD-10-CM | POA: Diagnosis not present

## 2014-06-02 DIAGNOSIS — J309 Allergic rhinitis, unspecified: Secondary | ICD-10-CM | POA: Diagnosis not present

## 2014-06-09 DIAGNOSIS — J309 Allergic rhinitis, unspecified: Secondary | ICD-10-CM | POA: Diagnosis not present

## 2014-06-23 DIAGNOSIS — J309 Allergic rhinitis, unspecified: Secondary | ICD-10-CM | POA: Diagnosis not present

## 2014-06-28 ENCOUNTER — Encounter: Payer: Self-pay | Admitting: Gastroenterology

## 2014-06-28 ENCOUNTER — Telehealth: Payer: Self-pay

## 2014-06-28 MED ORDER — FLUDROCORTISONE ACETATE 0.1 MG PO TABS: ORAL_TABLET | ORAL | Status: DC

## 2014-06-28 NOTE — Telephone Encounter (Signed)
Medication sent per pt's request.

## 2014-07-01 DIAGNOSIS — J309 Allergic rhinitis, unspecified: Secondary | ICD-10-CM | POA: Diagnosis not present

## 2014-07-06 ENCOUNTER — Telehealth: Payer: Self-pay | Admitting: Endocrinology

## 2014-07-06 NOTE — Telephone Encounter (Signed)
Pt scheduled for 07/07/2014 at 10 am.

## 2014-07-06 NOTE — Telephone Encounter (Signed)
Patient called wanting to make an appointment  Next available is not until 9/11/5 Patient states that he is not feeling well and he wants to be seen by Dr. Loanne Rocha earlier   Please advise  Thank you

## 2014-07-07 ENCOUNTER — Encounter: Payer: Self-pay | Admitting: Endocrinology

## 2014-07-07 ENCOUNTER — Ambulatory Visit (INDEPENDENT_AMBULATORY_CARE_PROVIDER_SITE_OTHER): Payer: Medicare Other | Admitting: Endocrinology

## 2014-07-07 VITALS — BP 112/60 | HR 67 | Temp 97.9°F | Ht 75.0 in | Wt 217.0 lb

## 2014-07-07 DIAGNOSIS — Z125 Encounter for screening for malignant neoplasm of prostate: Secondary | ICD-10-CM | POA: Diagnosis not present

## 2014-07-07 DIAGNOSIS — Z79899 Other long term (current) drug therapy: Secondary | ICD-10-CM

## 2014-07-07 DIAGNOSIS — E7800 Pure hypercholesterolemia, unspecified: Secondary | ICD-10-CM | POA: Diagnosis not present

## 2014-07-07 DIAGNOSIS — M810 Age-related osteoporosis without current pathological fracture: Secondary | ICD-10-CM | POA: Diagnosis not present

## 2014-07-07 DIAGNOSIS — Z0000 Encounter for general adult medical examination without abnormal findings: Secondary | ICD-10-CM | POA: Diagnosis not present

## 2014-07-07 DIAGNOSIS — E119 Type 2 diabetes mellitus without complications: Secondary | ICD-10-CM

## 2014-07-07 DIAGNOSIS — R972 Elevated prostate specific antigen [PSA]: Secondary | ICD-10-CM

## 2014-07-07 DIAGNOSIS — Z23 Encounter for immunization: Secondary | ICD-10-CM

## 2014-07-07 DIAGNOSIS — D649 Anemia, unspecified: Secondary | ICD-10-CM | POA: Diagnosis not present

## 2014-07-07 DIAGNOSIS — R634 Abnormal weight loss: Secondary | ICD-10-CM | POA: Insufficient documentation

## 2014-07-07 DIAGNOSIS — I10 Essential (primary) hypertension: Secondary | ICD-10-CM

## 2014-07-07 LAB — CBC WITH DIFFERENTIAL/PLATELET
BASOS ABS: 0 10*3/uL (ref 0.0–0.1)
BASOS PCT: 0.7 % (ref 0.0–3.0)
Eosinophils Absolute: 0.1 10*3/uL (ref 0.0–0.7)
Eosinophils Relative: 2.7 % (ref 0.0–5.0)
HCT: 35.2 % — ABNORMAL LOW (ref 39.0–52.0)
HEMOGLOBIN: 11.7 g/dL — AB (ref 13.0–17.0)
LYMPHS PCT: 45.6 % (ref 12.0–46.0)
Lymphs Abs: 2.3 10*3/uL (ref 0.7–4.0)
MCHC: 33.3 g/dL (ref 30.0–36.0)
MCV: 91.8 fl (ref 78.0–100.0)
MONOS PCT: 10.2 % (ref 3.0–12.0)
Monocytes Absolute: 0.5 10*3/uL (ref 0.1–1.0)
NEUTROS ABS: 2.1 10*3/uL (ref 1.4–7.7)
NEUTROS PCT: 40.8 % — AB (ref 43.0–77.0)
Platelets: 182 10*3/uL (ref 150.0–400.0)
RBC: 3.84 Mil/uL — AB (ref 4.22–5.81)
RDW: 14.9 % (ref 11.5–15.5)
WBC: 5.1 10*3/uL (ref 4.0–10.5)

## 2014-07-07 LAB — IBC PANEL
Iron: 72 ug/dL (ref 42–165)
SATURATION RATIOS: 24 % (ref 20.0–50.0)
TRANSFERRIN: 213.9 mg/dL (ref 212.0–360.0)

## 2014-07-07 LAB — BASIC METABOLIC PANEL
BUN: 22 mg/dL (ref 6–23)
CALCIUM: 9.4 mg/dL (ref 8.4–10.5)
CO2: 27 mEq/L (ref 19–32)
CREATININE: 1.4 mg/dL (ref 0.4–1.5)
Chloride: 103 mEq/L (ref 96–112)
GFR: 65.27 mL/min (ref >60.00)
GLUCOSE: 76 mg/dL (ref 70–99)
POTASSIUM: 3.8 meq/L (ref 3.5–5.1)
Sodium: 137 mEq/L (ref 135–145)

## 2014-07-07 LAB — URINALYSIS, ROUTINE W REFLEX MICROSCOPIC
BILIRUBIN URINE: NEGATIVE
Hgb urine dipstick: NEGATIVE
KETONES UR: NEGATIVE
LEUKOCYTES UA: NEGATIVE
NITRITE: NEGATIVE
PH: 7 (ref 5.0–8.0)
RBC / HPF: NONE SEEN (ref 0–?)
Specific Gravity, Urine: 1.015 (ref 1.000–1.030)
Total Protein, Urine: NEGATIVE
UROBILINOGEN UA: 0.2 (ref 0.0–1.0)
Urine Glucose: NEGATIVE
WBC, UA: NONE SEEN (ref 0–?)

## 2014-07-07 LAB — MICROALBUMIN / CREATININE URINE RATIO
CREATININE, U: 176.8 mg/dL
MICROALB UR: 0.3 mg/dL (ref 0.0–1.9)
Microalb Creat Ratio: 0.2 mg/g (ref 0.0–30.0)

## 2014-07-07 LAB — HEPATIC FUNCTION PANEL
ALBUMIN: 3.5 g/dL (ref 3.5–5.2)
ALT: 12 U/L (ref 0–53)
AST: 20 U/L (ref 0–37)
Alkaline Phosphatase: 47 U/L (ref 39–117)
BILIRUBIN TOTAL: 0.6 mg/dL (ref 0.2–1.2)
Bilirubin, Direct: 0.1 mg/dL (ref 0.0–0.3)
Total Protein: 6.5 g/dL (ref 6.0–8.3)

## 2014-07-07 LAB — LIPID PANEL
CHOL/HDL RATIO: 4
CHOLESTEROL: 168 mg/dL (ref 0–200)
HDL: 40.8 mg/dL (ref >39.00)
LDL CALC: 115 mg/dL — AB (ref 0–99)
NONHDL: 127.2
Triglycerides: 60 mg/dL (ref 0.0–149.0)
VLDL: 12 mg/dL (ref 0.0–40.0)

## 2014-07-07 LAB — HEMOGLOBIN A1C: Hgb A1c MFr Bld: 5.7 % (ref 4.6–6.5)

## 2014-07-07 LAB — PSA: PSA: 3.04 ng/mL (ref 0.10–4.00)

## 2014-07-07 LAB — TSH: TSH: 4.26 u[IU]/mL (ref 0.35–4.50)

## 2014-07-07 NOTE — Patient Instructions (Addendum)
blood tests are being requested for you today.  We'll contact you with results. Please stop the metformin, as this could bother your stomach. Please see a stomach specialist.  you will receive a phone call, about a day and time for an appointment please consider these measures for your health:  minimize alcohol.  do not use tobacco products.  have a colonoscopy at least every 10 years from age 79.  keep firearms safely stored.  always use seat belts.  have working smoke alarms in your home.  see an eye doctor and dentist regularly.  never drive under the influence of alcohol or drugs (including prescription drugs).  it is critically important to prevent falling down (keep floor areas well-lit, dry, and free of loose objects.  If you have a cane, walker, or wheelchair, you should use it, even for short trips around the house.  Also, try not to rush).   good diet and exercise habits significanly improve the control of your diabetes.  please let me know if you wish to be referred to a dietician.  high blood sugar is very risky to your health.  you should see an eye doctor and dentist every year.  You are at higher than average risk for pneumonia and hepatitis-B.  You should be vaccinated against both.   Please come back for a follow-up appointment in 6 months.

## 2014-07-07 NOTE — Progress Notes (Signed)
Subjective:    Patient ID: Joseph Rocha, male    DOB: [birth year], 79 y.o.   MRN: [id number]  HPI Pt states few months of nausea sensation in the abdomen, and assoc weight loss.   He stopped lipitor due to muscle cramps.  He was changed to crestor, but did not take, due to cost.   Past Medical History  Diagnosis Date  . Diabetes mellitus   . Hyperlipidemia   . Anemia   . Hypertension   . Allergy   . Pituitary abnormality 2004  . OA (osteoarthritis)   . Elevated PSA   . Hypogonadism male     Past Surgical History  Procedure Laterality Date  . Spine surgery    . Joint replacement    . Transphenoidal / transnasal hypophysectomy / resection pituitary tumor  2005  . Electrocardiogram  03/25/2006  . Doppler echocardiography  12/04/2001    History   Social History  . Marital Status: Married    Spouse Name: N/A    Number of Children: 2  . Years of Education: N/A   Occupational History  . retired    Social History Main Topics  . Smoking status: Former Smoker    Quit date: 02/22/1957  . Smokeless tobacco: Never Used  . Alcohol Use: No  . Drug Use: No  . Sexual Activity: Not Currently   Other Topics Concern  . Not on file   Social History Narrative   Lives with wife.  They have two grown children.   He is retired from the [employer].    Current Outpatient Prescriptions on File Prior to Visit  Medication Sig Dispense Refill  . aspirin 81 MG tablet Take 81 mg by mouth daily.      . calcium carbonate (OS-CAL) 600 MG TABS Take 600 mg by mouth daily.      . cetirizine (ZYRTEC) 10 MG tablet Take 10 mg by mouth daily.      . Cholecalciferol (VITAMIN D3 PO) Take 1 capsule by mouth daily. 614mcg      . finasteride (PROSCAR) 5 MG tablet Take 5 mg by mouth daily.        . fish oil-omega-3 fatty acids 1000 MG capsule Take 1 g by mouth daily.      . fludrocortisone (FLORINEF) 0.1 MG tablet TAKE 1 TABLET DAILY  90 tablet  1  . folic acid (FOLVITE) 1 MG tablet TAKE 1  TABLET DAILY  90 tablet  3  . HYDROcodone-acetaminophen (NORCO) 10-325 MG per tablet Take 1 tablet by mouth every 6 (six) hours as needed for pain.  30 tablet  0  . KLOR-CON M20 20 MEQ tablet Take 20 mEq by mouth daily.       . Magnesium Oxide 400 (240 MG) MG TABS Take 1 tablet by mouth 2 (two) times daily.  180 tablet  3  . meloxicam (MOBIC) 15 MG tablet Take 1 tablet (15 mg total) by mouth daily.  30 tablet  8  . methocarbamol (ROBAXIN) 500 MG tablet Take 500 mg by mouth at bedtime as needed for muscle spasms.      . Multiple Vitamins-Minerals (CENTRUM SILVER PO) Take 1 tablet by mouth daily.        . ondansetron (ZOFRAN) 8 MG tablet Take 1 tablet (8 mg total) by mouth every 4 (four) hours as needed for nausea.  10 tablet  0  . pioglitazone (ACTOS) 45 MG tablet TAKE 1 TABLET DAILY  90 tablet  2  .  Tamsulosin HCl (FLOMAX) 0.4 MG CAPS Take 0.4 mg by mouth daily.        . Testosterone (AXIRON) 30 MG/ACT SOLN Place onto the skin.       No current facility-administered medications on file prior to visit.    Allergies  Allergen Reactions  . Lipitor [Atorvastatin]     myalgias    Family History  Problem Relation Age of Onset  . Cancer Brother     uncertain type  . Diabetes Mellitus II Mother     Deceased, 53s  . Heart attack Father   . Healthy Daughter     BP 112/60  Pulse 67  Temp(Src) 97.9 F (36.6 C) (Oral)  Ht 6\' 3"  (1.905 m)  Wt 217 lb (98.431 kg)  BMI 27.12 kg/m2  SpO2 96%    Review of Systems He has fatigue and mild lightheadedness.    Objective:   Physical Exam VS: see vs page GEN: no distress HEAD: head: no deformity eyes: no periorbital swelling, no proptosis external nose and ears are normal mouth: no lesion seen NECK: supple, thyroid is not enlarged CHEST WALL: no deformity LUNGS: clear to auscultation BREASTS:  No gynecomastia CV: reg rate and rhythm, no murmur ABD: abdomen is soft, nontender.  no hepatosplenomegaly.  not distended.  no  hernia GENITALIA/RECTAL/PROSTATE: sees urology.  MUSCULOSKELETAL: muscle bulk and strength are grossly normal.  no obvious joint swelling.  gait is normal and steady EXTEMITIES: no deformity.  no ulcer on the feet.  feet are of normal color and temp.  no edema.  There is bilateral onychomycosis PULSES: dorsalis pedis intact bilat.  no carotid bruit NEURO:  cn 2-12 grossly intact.   readily moves all 4's.  sensation is intact to touch on the feet SKIN:  Normal texture and temperature.  No rash or suspicious lesion is visible.   NODES:  None palpable at the neck PSYCH: alert, well-oriented.  Does not appear anxious nor depressed.   Lab Results  Component Value Date   HGBA1C 5.7 07/07/2014      Assessment & Plan:  Nausea, new, uncertain etiology.  Refer GI Weight loss, mild, uncertain relationship to nausea.   DM: at this a1c, he is advised to take a trial off metformin, especially as this could be causing nausea.     Subjective:   Patient here for Medicare annual wellness visit and management of other chronic and acute problems.     Risk factors: advanced age    71 of Physicians Providing Medical Care to Patient:  See "snapshot"   Activities of Daily Living: In your present state of health, do you have any difficulty performing the following activities?:  Preparing food and eating?: No  Bathing yourself: No  Getting dressed: No  Using the toilet:No  Moving around from place to place: No  In the past year have you fallen or had a near fall?:No    Home Safety: Has smoke detector and wears seat belts. No firearms. No excess sun exposure.  Diet and Exercise  Current exercise habits: pt says good Dietary issues discussed: pt reports a healthy diet   Depression Screen  Q1: Over the past two weeks, have you felt down, depressed or hopeless? no  Q2: Over the past two weeks, have you felt little interest or pleasure in doing things? no   The following portions of the  patient's history were reviewed and updated as appropriate: allergies, current medications, past family history, past medical history, past social history,  past surgical history and problem list.  Past Medical History  Diagnosis Date  . Diabetes mellitus   . Hyperlipidemia   . Anemia   . Hypertension   . Allergy   . Pituitary abnormality 2004  . OA (osteoarthritis)   . Elevated PSA   . Hypogonadism male     Past Surgical History  Procedure Laterality Date  . Spine surgery    . Joint replacement    . Transphenoidal / transnasal hypophysectomy / resection pituitary tumor  2005  . Electrocardiogram  03/25/2006  . Doppler echocardiography  12/04/2001    History   Social History  . Marital Status: Married    Spouse Name: N/A    Number of Children: 2  . Years of Education: N/A   Occupational History  . retired    Social History Main Topics  . Smoking status: Former Smoker    Quit date: 02/22/1957  . Smokeless tobacco: Never Used  . Alcohol Use: No  . Drug Use: No  . Sexual Activity: Not Currently   Other Topics Concern  . Not on file   Social History Narrative   Lives with wife.  They have two grown children.   He is retired from the [employer].    Current Outpatient Prescriptions on File Prior to Visit  Medication Sig Dispense Refill  . aspirin 81 MG tablet Take 81 mg by mouth daily.      . calcium carbonate (OS-CAL) 600 MG TABS Take 600 mg by mouth daily.      . cetirizine (ZYRTEC) 10 MG tablet Take 10 mg by mouth daily.      . Cholecalciferol (VITAMIN D3 PO) Take 1 capsule by mouth daily. 631mcg      . finasteride (PROSCAR) 5 MG tablet Take 5 mg by mouth daily.        . fish oil-omega-3 fatty acids 1000 MG capsule Take 1 g by mouth daily.      . fludrocortisone (FLORINEF) 0.1 MG tablet TAKE 1 TABLET DAILY  90 tablet  1  . folic acid (FOLVITE) 1 MG tablet TAKE 1 TABLET DAILY  90 tablet  3  . HYDROcodone-acetaminophen (NORCO) 10-325 MG per tablet Take 1 tablet  by mouth every 6 (six) hours as needed for pain.  30 tablet  0  . KLOR-CON M20 20 MEQ tablet Take 20 mEq by mouth daily.       . Magnesium Oxide 400 (240 MG) MG TABS Take 1 tablet by mouth 2 (two) times daily.  180 tablet  3  . meloxicam (MOBIC) 15 MG tablet Take 1 tablet (15 mg total) by mouth daily.  30 tablet  8  . methocarbamol (ROBAXIN) 500 MG tablet Take 500 mg by mouth at bedtime as needed for muscle spasms.      . Multiple Vitamins-Minerals (CENTRUM SILVER PO) Take 1 tablet by mouth daily.        . ondansetron (ZOFRAN) 8 MG tablet Take 1 tablet (8 mg total) by mouth every 4 (four) hours as needed for nausea.  10 tablet  0  . pioglitazone (ACTOS) 45 MG tablet TAKE 1 TABLET DAILY  90 tablet  2  . Tamsulosin HCl (FLOMAX) 0.4 MG CAPS Take 0.4 mg by mouth daily.        . Testosterone (AXIRON) 30 MG/ACT SOLN Place onto the skin.       No current facility-administered medications on file prior to visit.    Allergies  Allergen Reactions  . Lipitor [  Atorvastatin]     myalgias    Family History  Problem Relation Age of Onset  . Cancer Brother     uncertain type  . Diabetes Mellitus II Mother     Deceased, 11s  . Heart attack Father   . Healthy Daughter     BP 112/60  Pulse 67  Temp(Src) 97.9 F (36.6 C) (Oral)  Ht 6\' 3"  (1.905 m)  Wt 217 lb (98.431 kg)  BMI 27.12 kg/m2  SpO2 96%  Review of Systems  Denies hearing loss, and visual loss Objective:   Vision:  Sees opthalmologist Hearing: grossly normal Body mass index:  See vs page Msk: pt easily and quickly performs "get-up-and-go" from a sitting position Cognitive Impairment Assessment: cognition, memory and judgment appear normal.  remembers 3/3 at 5 minutes.  excellent recall.  can easily read and write a sentence.  alert and oriented x 3   Assessment:   Medicare wellness utd on preventive parameters.    Plan:   During the course of the visit the patient was educated and counseled about appropriate screening and  preventive services including:        Fall prevention  Bone densitometry screening  Diabetes screening  Nutrition counseling   Vaccines / LABS Zostavax / Pneumococcal Vaccine  today  PSA  Patient Instructions (the written plan) was given to the patient.

## 2014-07-08 LAB — PTH, INTACT AND CALCIUM
CALCIUM: 9.4 mg/dL (ref 8.4–10.5)
PTH: 20 pg/mL (ref 14–64)

## 2014-07-08 MED ORDER — PRAVASTATIN SODIUM 80 MG PO TABS: 80.0000 mg | ORAL_TABLET | Freq: Every day | ORAL | Status: DC

## 2014-07-14 DIAGNOSIS — J309 Allergic rhinitis, unspecified: Secondary | ICD-10-CM | POA: Diagnosis not present

## 2014-07-22 ENCOUNTER — Encounter: Payer: Self-pay | Admitting: Gastroenterology

## 2014-07-22 ENCOUNTER — Ambulatory Visit (INDEPENDENT_AMBULATORY_CARE_PROVIDER_SITE_OTHER): Payer: Medicare Other | Admitting: Gastroenterology

## 2014-07-22 VITALS — BP 90/60 | HR 64 | Ht 74.0 in | Wt 219.1 lb

## 2014-07-22 DIAGNOSIS — R634 Abnormal weight loss: Secondary | ICD-10-CM

## 2014-07-22 DIAGNOSIS — D649 Anemia, unspecified: Secondary | ICD-10-CM

## 2014-07-22 DIAGNOSIS — K5900 Constipation, unspecified: Secondary | ICD-10-CM

## 2014-07-22 MED ORDER — OMEPRAZOLE 40 MG PO CPDR
40.0000 mg | DELAYED_RELEASE_CAPSULE | Freq: Every day | ORAL | Status: DC
Start: 2014-07-22 — End: 2015-08-15

## 2014-07-22 NOTE — Progress Notes (Signed)
History of Present Illness: This is a 79 year old male with decreased appetite, nausea and weight loss. Had some dark stools and constipation on Geritol. He takes Mobic daily. Colonoscopy 12/2010 diverticulosis and internal hemorrhoids. Denies abdominal pain, constipation, diarrhea, change in stool caliber, melena, hematochezia, vomiting, dysphagia, reflux symptoms, chest pain.  Allergies  Allergen Reactions  . Lipitor [Atorvastatin]     myalgias   Outpatient Prescriptions Prior to Visit  Medication Sig Dispense Refill  . aspirin 81 MG tablet Take 81 mg by mouth daily.      . calcium carbonate (OS-CAL) 600 MG TABS Take 600 mg by mouth daily.      . cetirizine (ZYRTEC) 10 MG tablet Take 10 mg by mouth daily.      . Cholecalciferol (VITAMIN D3 PO) Take 1 capsule by mouth daily. 62mcg      . finasteride (PROSCAR) 5 MG tablet Take 5 mg by mouth daily.        . fish oil-omega-3 fatty acids 1000 MG capsule Take 1 g by mouth daily.      . fludrocortisone (FLORINEF) 0.1 MG tablet TAKE 1 TABLET DAILY  90 tablet  1  . folic acid (FOLVITE) 1 MG tablet TAKE 1 TABLET DAILY  90 tablet  3  . HYDROcodone-acetaminophen (NORCO) 10-325 MG per tablet Take 1 tablet by mouth every 6 (six) hours as needed for pain.  30 tablet  0  . KLOR-CON M20 20 MEQ tablet Take 20 mEq by mouth daily.       . Magnesium Oxide 400 (240 MG) MG TABS Take 1 tablet by mouth 2 (two) times daily.  180 tablet  3  . meloxicam (MOBIC) 15 MG tablet Take 1 tablet (15 mg total) by mouth daily.  30 tablet  8  . methocarbamol (ROBAXIN) 500 MG tablet Take 500 mg by mouth at bedtime as needed for muscle spasms.      . Multiple Vitamins-Minerals (CENTRUM SILVER PO) Take 1 tablet by mouth daily.        . pioglitazone (ACTOS) 45 MG tablet TAKE 1 TABLET DAILY  90 tablet  2  . pravastatin (PRAVACHOL) 80 MG tablet Take 1 tablet (80 mg total) by mouth daily.  90 tablet  3  . Tamsulosin HCl (FLOMAX) 0.4 MG CAPS Take 0.4 mg by mouth daily.        .  Testosterone (AXIRON) 30 MG/ACT SOLN Place onto the skin.      Marland Kitchen ondansetron (ZOFRAN) 8 MG tablet Take 1 tablet (8 mg total) by mouth every 4 (four) hours as needed for nausea.  10 tablet  0   No facility-administered medications prior to visit.   Past Medical History  Diagnosis Date  . Diabetes mellitus   . Hyperlipidemia   . Anemia   . Hypertension   . Allergy   . Pituitary abnormality 2004  . OA (osteoarthritis)   . Elevated PSA   . Hypogonadism male    Past Surgical History  Procedure Laterality Date  . Spine surgery    . Joint replacement    . Transphenoidal / transnasal hypophysectomy / resection pituitary tumor  2005  . Electrocardiogram  03/25/2006  . Doppler echocardiography  12/04/2001   History   Social History  . Marital Status: Married    Spouse Name: N/A    Number of Children: 2  . Years of Education: N/A   Occupational History  . retired    Social History Main Topics  . Smoking  status: Former Smoker    Quit date: 02/22/1957  . Smokeless tobacco: Never Used  . Alcohol Use: No  . Drug Use: No  . Sexual Activity: Not Currently   Other Topics Concern  . None   Social History Narrative   Lives with wife.  They have two grown children.   He is retired from the [employer].   Family History  Problem Relation Age of Onset  . Cancer Brother     uncertain type  . Diabetes Mellitus II Mother     Deceased, 10s  . Heart attack Father   . Healthy Daughter      Review of Systems: Pertinent positive and negative review of systems were noted in the above HPI section. All other review of systems were otherwise negative.   Physical Exam: General: Well developed , well nourished, no acute distress Head: Normocephalic and atraumatic Eyes:  sclerae anicteric, EOMI Ears: Normal auditory acuity Mouth: No deformity or lesions Neck: Supple, no masses or thyromegaly Lungs: Clear throughout to auscultation Heart: Regular rate and rhythm; no murmurs, rubs or  bruits Abdomen: Soft, non tender and non distended. No masses, hepatosplenomegaly or hernias noted. Normal Bowel sounds Musculoskeletal: Symmetrical with no gross deformities  Skin: No lesions on visible extremities Pulses:  Normal pulses noted Extremities: No clubbing, cyanosis, edema or deformities noted Neurological: Alert oriented x 4, grossly nonfocal Cervical Nodes:  No significant cervical adenopathy Inguinal Nodes: No significant inguinal adenopathy Psychological:  Alert and cooperative. Normal mood and affect  Assessment and Recommendations:  1. Nausea, weight loss, NSAID usage. R/O ulcer, gastritis. Omeprazole 40 mg daily. Schedule EGD. The risks, benefits, and alternatives to endoscopy with possible biopsy and possible dilation were discussed with the patient and they consent to proceed.   2. Chronic anemia without Fe or B12 deficiency. Dr. Loanne Drilling to consider Hematology referral.   3. Mild constipation. Discontinue Geritol. High fiber diet with adequate water intake. MiraLax once or twice daily as needed.

## 2014-07-22 NOTE — Patient Instructions (Signed)
You have been scheduled for an endoscopy. Please follow written instructions given to you at your visit today. If you use inhalers (even only as needed), please bring them with you on the day of your procedure. Your physician has requested that you go to www.startemmi.com and enter the access code given to you at your visit today. This web site gives a general overview about your procedure. However, you should still follow specific instructions given to you by our office regarding your preparation for the procedure.  Start over the counter Miralax mixing 17 grams in 8 oz of water 1-2 x daily.   We have sent the following medications to your pharmacy for you to pick up at your convenience:omepraozle.   Thank you for choosing me and [facility].  Joseph Rocha. Dagoberto Ligas., MD., [facility]

## 2014-07-28 DIAGNOSIS — J309 Allergic rhinitis, unspecified: Secondary | ICD-10-CM | POA: Diagnosis not present

## 2014-08-02 ENCOUNTER — Telehealth: Payer: Self-pay | Admitting: Gastroenterology

## 2014-08-02 NOTE — Telephone Encounter (Signed)
Patient reports that all symptoms have resolved and wants to cancel his procedure on Friday

## 2014-08-06 ENCOUNTER — Encounter: Payer: Medicare Other | Admitting: Gastroenterology

## 2014-08-11 DIAGNOSIS — J3089 Other allergic rhinitis: Secondary | ICD-10-CM | POA: Diagnosis not present

## 2014-08-11 DIAGNOSIS — J301 Allergic rhinitis due to pollen: Secondary | ICD-10-CM | POA: Diagnosis not present

## 2014-08-24 DIAGNOSIS — J3089 Other allergic rhinitis: Secondary | ICD-10-CM | POA: Diagnosis not present

## 2014-08-24 DIAGNOSIS — J301 Allergic rhinitis due to pollen: Secondary | ICD-10-CM | POA: Diagnosis not present

## 2014-09-08 DIAGNOSIS — J301 Allergic rhinitis due to pollen: Secondary | ICD-10-CM | POA: Diagnosis not present

## 2014-09-08 DIAGNOSIS — J3089 Other allergic rhinitis: Secondary | ICD-10-CM | POA: Diagnosis not present

## 2014-09-22 DIAGNOSIS — J301 Allergic rhinitis due to pollen: Secondary | ICD-10-CM | POA: Diagnosis not present

## 2014-10-06 DIAGNOSIS — J3089 Other allergic rhinitis: Secondary | ICD-10-CM | POA: Diagnosis not present

## 2014-10-06 DIAGNOSIS — J301 Allergic rhinitis due to pollen: Secondary | ICD-10-CM | POA: Diagnosis not present

## 2014-11-01 ENCOUNTER — Other Ambulatory Visit: Payer: Self-pay | Admitting: Endocrinology

## 2014-11-01 DIAGNOSIS — J301 Allergic rhinitis due to pollen: Secondary | ICD-10-CM | POA: Diagnosis not present

## 2014-11-01 DIAGNOSIS — J3089 Other allergic rhinitis: Secondary | ICD-10-CM | POA: Diagnosis not present

## 2014-11-02 ENCOUNTER — Other Ambulatory Visit: Payer: Self-pay | Admitting: *Deleted

## 2014-11-02 MED ORDER — FLUDROCORTISONE ACETATE 0.1 MG PO TABS: ORAL_TABLET | ORAL | Status: DC

## 2014-11-02 NOTE — Telephone Encounter (Signed)
ok 

## 2014-11-10 ENCOUNTER — Ambulatory Visit (INDEPENDENT_AMBULATORY_CARE_PROVIDER_SITE_OTHER): Payer: Medicare Other | Admitting: Endocrinology

## 2014-11-10 ENCOUNTER — Encounter: Payer: Self-pay | Admitting: Endocrinology

## 2014-11-10 VITALS — BP 100/70 | HR 67 | Temp 97.9°F | Ht 74.0 in | Wt 220.0 lb

## 2014-11-10 DIAGNOSIS — I950 Idiopathic hypotension: Secondary | ICD-10-CM

## 2014-11-10 DIAGNOSIS — D649 Anemia, unspecified: Secondary | ICD-10-CM

## 2014-11-10 DIAGNOSIS — I959 Hypotension, unspecified: Secondary | ICD-10-CM | POA: Insufficient documentation

## 2014-11-10 DIAGNOSIS — E119 Type 2 diabetes mellitus without complications: Secondary | ICD-10-CM

## 2014-11-10 LAB — CBC WITH DIFFERENTIAL/PLATELET
BASOS PCT: 0.1 % (ref 0.0–3.0)
Basophils Absolute: 0 10*3/uL (ref 0.0–0.1)
Eosinophils Absolute: 0 10*3/uL (ref 0.0–0.7)
Eosinophils Relative: 0.3 % (ref 0.0–5.0)
HCT: 34.7 % — ABNORMAL LOW (ref 39.0–52.0)
HEMOGLOBIN: 11.3 g/dL — AB (ref 13.0–17.0)
LYMPHS PCT: 10.4 % — AB (ref 12.0–46.0)
Lymphs Abs: 1.3 10*3/uL (ref 0.7–4.0)
MCHC: 32.5 g/dL (ref 30.0–36.0)
MCV: 91.8 fl (ref 78.0–100.0)
MONO ABS: 0.7 10*3/uL (ref 0.1–1.0)
Monocytes Relative: 5.4 % (ref 3.0–12.0)
Neutro Abs: 10.4 10*3/uL — ABNORMAL HIGH (ref 1.4–7.7)
Neutrophils Relative %: 83.8 % — ABNORMAL HIGH (ref 43.0–77.0)
Platelets: 176 10*3/uL (ref 150.0–400.0)
RBC: 3.78 Mil/uL — AB (ref 4.22–5.81)
RDW: 15.4 % (ref 11.5–15.5)
WBC: 12.5 10*3/uL — AB (ref 4.0–10.5)

## 2014-11-10 LAB — IBC PANEL
IRON: 13 ug/dL — AB (ref 42–165)
SATURATION RATIOS: 4.4 % — AB (ref 20.0–50.0)
TRANSFERRIN: 210.8 mg/dL — AB (ref 212.0–360.0)

## 2014-11-10 LAB — BASIC METABOLIC PANEL
BUN: 24 mg/dL — AB (ref 6–23)
CO2: 27 mEq/L (ref 19–32)
CREATININE: 1.5 mg/dL (ref 0.4–1.5)
Calcium: 8.8 mg/dL (ref 8.4–10.5)
Chloride: 100 mEq/L (ref 96–112)
GFR: 56.93 mL/min — AB (ref >60.00)
GLUCOSE: 83 mg/dL (ref 70–99)
Potassium: 4.1 mEq/L (ref 3.5–5.1)
Sodium: 133 mEq/L — ABNORMAL LOW (ref 135–145)

## 2014-11-10 LAB — HEMOGLOBIN A1C: Hgb A1c MFr Bld: 5.7 % (ref 4.6–6.5)

## 2014-11-10 MED ORDER — FLUDROCORTISONE ACETATE 0.1 MG PO TABS: 0.1000 mg | ORAL_TABLET | Freq: Two times a day (BID) | ORAL | Status: DC

## 2014-11-10 MED ORDER — ONDANSETRON HCL 4 MG PO TABS
4.0000 mg | ORAL_TABLET | Freq: Three times a day (TID) | ORAL | Status: DC | PRN
Start: 2014-11-10 — End: 2014-11-24

## 2014-11-10 MED ORDER — HYDROCODONE-HOMATROPINE 5-1.5 MG/5ML PO SYRP: 5.0000 mL | ORAL_SOLUTION | Freq: Four times a day (QID) | ORAL | Status: DC | PRN

## 2014-11-10 NOTE — Progress Notes (Signed)
Subjective:    Patient ID: Joseph Rocha, male    DOB: [birth year], 80 y.o.   MRN: [id number]  HPI Pt returns for f/u of diabetes mellitus: DM type: 2 Dx'ed: 8101 Complications: none Therapy: piglitizone DKA: never Severe hypoglycemia: never Pancreatitis: never Other: he did not tolerate metformin-XR (GI sxs).   Interval history: no cbg record, but states cbg's are well-controlled.  Pt states 2 days of slight dizziness sensation in the head, and assoc nausea.   Past Medical History  Diagnosis Date  . Diabetes mellitus   . Hyperlipidemia   . Anemia   . Hypertension   . Allergy   . Pituitary abnormality 2004  . OA (osteoarthritis)   . Elevated PSA   . Hypogonadism male     Past Surgical History  Procedure Laterality Date  . Lumbar disc surgery    . Knee arthroscopy Left   . Transphenoidal / transnasal hypophysectomy / resection pituitary tumor  2005  . Electrocardiogram  03/25/2006  . Doppler echocardiography  12/04/2001  . Meniscus repair Right may 2015  . Tonsillectomy      History   Social History  . Marital Status: Married    Spouse Name: N/A    Number of Children: 2  . Years of Education: N/A   Occupational History  . retired    Social History Main Topics  . Smoking status: Former Smoker    Quit date: 02/22/1957  . Smokeless tobacco: Never Used  . Alcohol Use: No  . Drug Use: No  . Sexual Activity: Not Currently   Other Topics Concern  . Not on file   Social History Narrative   Lives with wife.  They have two grown children.   He is retired from the [employer].    Current Outpatient Prescriptions on File Prior to Visit  Medication Sig Dispense Refill  . aspirin 81 MG tablet Take 81 mg by mouth daily.    . calcium carbonate (OS-CAL) 600 MG TABS Take 600 mg by mouth daily.    . cetirizine (ZYRTEC) 10 MG tablet Take 10 mg by mouth daily.    . Cholecalciferol (VITAMIN D3 PO) Take 1 capsule by mouth daily. 612mcg    . finasteride (PROSCAR)  5 MG tablet Take 5 mg by mouth daily.      . fish oil-omega-3 fatty acids 1000 MG capsule Take 1 g by mouth daily.    . folic acid (FOLVITE) 1 MG tablet TAKE 1 TABLET DAILY 90 tablet 3  . HYDROcodone-acetaminophen (NORCO) 10-325 MG per tablet Take 1 tablet by mouth every 6 (six) hours as needed for pain. 30 tablet 0  . KLOR-CON M20 20 MEQ tablet Take 20 mEq by mouth daily.     . Magnesium Oxide 400 (240 MG) MG TABS Take 1 tablet by mouth 2 (two) times daily. 180 tablet 3  . meloxicam (MOBIC) 15 MG tablet Take 1 tablet (15 mg total) by mouth daily. 30 tablet 8  . methocarbamol (ROBAXIN) 500 MG tablet Take 500 mg by mouth at bedtime as needed for muscle spasms.    . Multiple Vitamins-Minerals (CENTRUM SILVER PO) Take 1 tablet by mouth daily.      Marland Kitchen omeprazole (PRILOSEC) 40 MG capsule Take 1 capsule (40 mg total) by mouth daily. 30 capsule 11  . pioglitazone (ACTOS) 45 MG tablet TAKE 1 TABLET DAILY 90 tablet 2  . pravastatin (PRAVACHOL) 80 MG tablet Take 1 tablet (80 mg total) by mouth daily. 90 tablet 3  .  Tamsulosin HCl (FLOMAX) 0.4 MG CAPS Take 0.4 mg by mouth daily.      . Testosterone (AXIRON) 30 MG/ACT SOLN Place onto the skin.     No current facility-administered medications on file prior to visit.    Allergies  Allergen Reactions  . Lipitor [Atorvastatin]     myalgias    Family History  Problem Relation Age of Onset  . Cancer Brother     uncertain type  . Diabetes Mellitus II Mother     Deceased, 37s  . Heart attack Father   . Healthy Daughter     BP 100/70 mmHg  Pulse 67  Temp(Src) 97.9 F (36.6 C) (Oral)  Ht 6\' 2"  (1.88 m)  Wt 220 lb (99.791 kg)  BMI 28.23 kg/m2    Review of Systems Denies LOC and vomiting. Denies weight change.  He has a slight dry cough, but no fever.     Objective:   Physical Exam VITAL SIGNS:  See vs page.  i checked upright and supine BP--no postural change GENERAL: no distress.  LUNGS:  Clear to auscultation.   ABDOMEN:abdomen is soft,  nontender.  no hepatosplenomegaly. not distended.  no hernia Pulses: dorsalis pedis intact bilat.   MSK: no deformity of the feet.  CV: no leg edema.   Skin:  no ulcer on the feet.  normal color and temp on the feet.  Neuro: sensation is intact to touch on the feet.     Lab Results  Component Value Date   CREATININE 1.5 11/10/2014   BUN 24* 11/10/2014   NA 133* 11/10/2014   K 4.1 11/10/2014   CL 100 11/10/2014   CO2 27 11/10/2014   Lab Results  Component Value Date   WBC 12.5* 11/10/2014   HGB 11.3* 11/10/2014   HCT 34.7* 11/10/2014   MCV 91.8 11/10/2014   PLT 176.0 11/10/2014        Assessment & Plan:  Chronic idiopathic hypotension, moderate exacerbation URI: new Anemia, mild.  Patient is advised the following: Patient Instructions  blood tests are being requested for you today.  We'll let you know about the results. Please take 2 extra fludrocortisone pills today.  Then starting tomorrow, take 2 per day. Please come back for a follow-up appointment in 1 week. If you pass out, go the the ER. i have also sent a prescription to your pharmacy, for the nausea.   Here is a prescription for cough syrup.

## 2014-11-10 NOTE — Patient Instructions (Addendum)
blood tests are being requested for you today.  We'll let you know about the results. Please take 2 extra fludrocortisone pills today.  Then starting tomorrow, take 2 per day. Please come back for a follow-up appointment in 1 week. If you pass out, go the the ER. i have also sent a prescription to your pharmacy, for the nausea.   Here is a prescription for cough syrup.

## 2014-11-17 ENCOUNTER — Ambulatory Visit (INDEPENDENT_AMBULATORY_CARE_PROVIDER_SITE_OTHER): Payer: Medicare Other | Admitting: Endocrinology

## 2014-11-17 ENCOUNTER — Encounter: Payer: Self-pay | Admitting: Endocrinology

## 2014-11-17 VITALS — BP 134/88 | HR 63 | Temp 97.7°F | Ht 74.0 in | Wt 219.0 lb

## 2014-11-17 DIAGNOSIS — J3089 Other allergic rhinitis: Secondary | ICD-10-CM | POA: Diagnosis not present

## 2014-11-17 DIAGNOSIS — I950 Idiopathic hypotension: Secondary | ICD-10-CM | POA: Diagnosis not present

## 2014-11-17 DIAGNOSIS — J301 Allergic rhinitis due to pollen: Secondary | ICD-10-CM | POA: Diagnosis not present

## 2014-11-17 LAB — BASIC METABOLIC PANEL
BUN: 19 mg/dL (ref 6–23)
CHLORIDE: 98 meq/L (ref 96–112)
CO2: 34 mEq/L — ABNORMAL HIGH (ref 19–32)
Calcium: 9.2 mg/dL (ref 8.4–10.5)
Creatinine, Ser: 1.14 mg/dL (ref 0.40–1.50)
GFR: 79.94 mL/min (ref >60.00)
Glucose, Bld: 99 mg/dL (ref 70–99)
POTASSIUM: 3.4 meq/L — AB (ref 3.5–5.1)
Sodium: 137 mEq/L (ref 135–145)

## 2014-11-17 NOTE — Progress Notes (Signed)
Subjective:    Patient ID: Joseph Rocha, male    DOB: 16-Sep-1935, 80 y.o.   MRN: [id number]  HPI  The state of at least three ongoing medical problems is addressed today, with interval history of each noted here: Idiopathic hypotension: florinef was doubled last week: dizziness is "75% better." Hypokalemia: he denies cramps Acute bronchitis: he feels better.  He denies sob Past Medical History  Diagnosis Date  . Diabetes mellitus   . Hyperlipidemia   . Anemia   . Hypertension   . Allergy   . Pituitary abnormality 2004  . OA (osteoarthritis)   . Elevated PSA   . Hypogonadism male     Past Surgical History  Procedure Laterality Date  . Lumbar disc surgery    . Knee arthroscopy Left   . Transphenoidal / transnasal hypophysectomy / resection pituitary tumor  2005  . Electrocardiogram  03/25/2006  . Doppler echocardiography  12/04/2001  . Meniscus repair Right may 2015  . Tonsillectomy      History   Social History  . Marital Status: Married    Spouse Name: N/A    Number of Children: 2  . Years of Education: N/A   Occupational History  . retired    Social History Main Topics  . Smoking status: Former Smoker    Quit date: 02/22/1957  . Smokeless tobacco: Never Used  . Alcohol Use: No  . Drug Use: No  . Sexual Activity: Not Currently   Other Topics Concern  . Not on file   Social History Narrative   Lives with wife.  They have two grown children.   He is retired from the [employer].    Current Outpatient Prescriptions on File Prior to Visit  Medication Sig Dispense Refill  . aspirin 81 MG tablet Take 81 mg by mouth daily.    . calcium carbonate (OS-CAL) 600 MG TABS Take 600 mg by mouth daily.    . cetirizine (ZYRTEC) 10 MG tablet Take 10 mg by mouth daily.    . Cholecalciferol (VITAMIN D3 PO) Take 1 capsule by mouth daily. 671mcg    . finasteride (PROSCAR) 5 MG tablet Take 5 mg by mouth daily.      . fish oil-omega-3 fatty acids 1000 MG capsule Take  1 g by mouth daily.    . fludrocortisone (FLORINEF) 0.1 MG tablet Take 1 tablet (0.1 mg total) by mouth 2 (two) times daily. 60 tablet 11  . folic acid (FOLVITE) 1 MG tablet TAKE 1 TABLET DAILY 90 tablet 3  . HYDROcodone-acetaminophen (NORCO) 10-325 MG per tablet Take 1 tablet by mouth every 6 (six) hours as needed for pain. 30 tablet 0  . HYDROcodone-homatropine (HYDROMET) 5-1.5 MG/5ML syrup Take 5 mLs by mouth every 6 (six) hours as needed for cough. 120 mL 0  . KLOR-CON M20 20 MEQ tablet Take 20 mEq by mouth daily.     . Magnesium Oxide 400 (240 MG) MG TABS Take 1 tablet by mouth 2 (two) times daily. 180 tablet 3  . meloxicam (MOBIC) 15 MG tablet Take 1 tablet (15 mg total) by mouth daily. 30 tablet 8  . methocarbamol (ROBAXIN) 500 MG tablet Take 500 mg by mouth at bedtime as needed for muscle spasms.    . Multiple Vitamins-Minerals (CENTRUM SILVER PO) Take 1 tablet by mouth daily.      Marland Kitchen omeprazole (PRILOSEC) 40 MG capsule Take 1 capsule (40 mg total) by mouth daily. 30 capsule 11  . ondansetron (ZOFRAN)  4 MG tablet Take 1 tablet (4 mg total) by mouth every 8 (eight) hours as needed for nausea or vomiting. 36 tablet 1  . pioglitazone (ACTOS) 45 MG tablet TAKE 1 TABLET DAILY 90 tablet 2  . pravastatin (PRAVACHOL) 80 MG tablet Take 1 tablet (80 mg total) by mouth daily. 90 tablet 3  . Tamsulosin HCl (FLOMAX) 0.4 MG CAPS Take 0.4 mg by mouth daily.      . Testosterone (AXIRON) 30 MG/ACT SOLN Place onto the skin.     No current facility-administered medications on file prior to visit.    Allergies  Allergen Reactions  . Lipitor [Atorvastatin]     myalgias    Family History  Problem Relation Age of Onset  . Cancer Brother     uncertain type  . Diabetes Mellitus II Mother     Deceased, 44s  . Heart attack Father   . Healthy Daughter     BP 134/88 mmHg  Pulse 63  Temp(Src) 97.7 F (36.5 C) (Oral)  Ht 6\' 2"  (1.88 m)  Wt 219 lb (99.338 kg)  BMI 28.11 kg/m2  SpO2 97%   Review  of Systems Denies LOC.  Cough is resolved.    Objective:   Physical Exam VITAL SIGNS:  See vs page GENERAL: no distress Ext: no edema Gait: normal and steady.   Lab Results  Component Value Date   CREATININE 1.14 11/17/2014   BUN 19 11/17/2014   NA 137 11/17/2014   K 3.4* 11/17/2014   CL 98 11/17/2014   CO2 34* 11/17/2014      Assessment & Plan:  Hypotension, improved Acute bronchitis, almost resolved Hypokalemia, mild.  Please continue the same [place].  Patient is advised the following: Patient Instructions  blood tests are being requested for you today.  We'll let you know about the results. Please continue the same medications.

## 2014-11-17 NOTE — Patient Instructions (Signed)
blood tests are being requested for you today.  We'll let you know about the results. Please continue the same medications.

## 2014-11-24 ENCOUNTER — Other Ambulatory Visit: Payer: Self-pay | Admitting: Endocrinology

## 2014-11-24 ENCOUNTER — Telehealth: Payer: Self-pay

## 2014-11-24 MED ORDER — ONDANSETRON HCL 4 MG PO TABS: 4.0000 mg | ORAL_TABLET | Freq: Three times a day (TID) | ORAL | Status: DC | PRN

## 2014-11-24 MED ORDER — HYDROCODONE-HOMATROPINE 5-1.5 MG/5ML PO SYRP: 5.0000 mL | ORAL_SOLUTION | Freq: Four times a day (QID) | ORAL | Status: AC | PRN

## 2014-11-24 NOTE — Telephone Encounter (Signed)
Please verify no fever or sob i printed

## 2014-11-24 NOTE — Telephone Encounter (Signed)
Pt called wanting to know if he could get a refill on the cough medication prescribed at his last OV.  Please advise, Thanks!

## 2014-11-25 NOTE — Telephone Encounter (Signed)
Pt advised of note below. rx placed up front.

## 2014-12-01 ENCOUNTER — Other Ambulatory Visit: Payer: Self-pay | Admitting: Endocrinology

## 2014-12-01 DIAGNOSIS — J3089 Other allergic rhinitis: Secondary | ICD-10-CM | POA: Diagnosis not present

## 2014-12-01 DIAGNOSIS — J301 Allergic rhinitis due to pollen: Secondary | ICD-10-CM | POA: Diagnosis not present

## 2014-12-15 DIAGNOSIS — J301 Allergic rhinitis due to pollen: Secondary | ICD-10-CM | POA: Diagnosis not present

## 2014-12-15 DIAGNOSIS — J3089 Other allergic rhinitis: Secondary | ICD-10-CM | POA: Diagnosis not present

## 2014-12-30 DIAGNOSIS — J301 Allergic rhinitis due to pollen: Secondary | ICD-10-CM | POA: Diagnosis not present

## 2014-12-30 DIAGNOSIS — J3089 Other allergic rhinitis: Secondary | ICD-10-CM | POA: Diagnosis not present

## 2014-12-31 DIAGNOSIS — J3089 Other allergic rhinitis: Secondary | ICD-10-CM | POA: Diagnosis not present

## 2014-12-31 DIAGNOSIS — J301 Allergic rhinitis due to pollen: Secondary | ICD-10-CM | POA: Diagnosis not present

## 2015-01-04 DIAGNOSIS — J301 Allergic rhinitis due to pollen: Secondary | ICD-10-CM | POA: Diagnosis not present

## 2015-01-04 DIAGNOSIS — J3089 Other allergic rhinitis: Secondary | ICD-10-CM | POA: Diagnosis not present

## 2015-01-05 ENCOUNTER — Ambulatory Visit: Payer: Medicare Other | Admitting: Endocrinology

## 2015-01-05 DIAGNOSIS — H6123 Impacted cerumen, bilateral: Secondary | ICD-10-CM | POA: Diagnosis not present

## 2015-01-12 DIAGNOSIS — J301 Allergic rhinitis due to pollen: Secondary | ICD-10-CM | POA: Diagnosis not present

## 2015-01-12 DIAGNOSIS — J3089 Other allergic rhinitis: Secondary | ICD-10-CM | POA: Diagnosis not present

## 2015-01-14 DIAGNOSIS — J301 Allergic rhinitis due to pollen: Secondary | ICD-10-CM | POA: Diagnosis not present

## 2015-01-14 DIAGNOSIS — J3089 Other allergic rhinitis: Secondary | ICD-10-CM | POA: Diagnosis not present

## 2015-01-19 DIAGNOSIS — J301 Allergic rhinitis due to pollen: Secondary | ICD-10-CM | POA: Diagnosis not present

## 2015-01-19 DIAGNOSIS — J3089 Other allergic rhinitis: Secondary | ICD-10-CM | POA: Diagnosis not present

## 2015-01-25 DIAGNOSIS — J301 Allergic rhinitis due to pollen: Secondary | ICD-10-CM | POA: Diagnosis not present

## 2015-01-25 DIAGNOSIS — J3089 Other allergic rhinitis: Secondary | ICD-10-CM | POA: Diagnosis not present

## 2015-01-27 DIAGNOSIS — J3089 Other allergic rhinitis: Secondary | ICD-10-CM | POA: Diagnosis not present

## 2015-01-27 DIAGNOSIS — J301 Allergic rhinitis due to pollen: Secondary | ICD-10-CM | POA: Diagnosis not present

## 2015-02-08 DIAGNOSIS — J3089 Other allergic rhinitis: Secondary | ICD-10-CM | POA: Diagnosis not present

## 2015-02-08 DIAGNOSIS — J301 Allergic rhinitis due to pollen: Secondary | ICD-10-CM | POA: Diagnosis not present

## 2015-02-11 DIAGNOSIS — J301 Allergic rhinitis due to pollen: Secondary | ICD-10-CM | POA: Diagnosis not present

## 2015-02-11 DIAGNOSIS — J3089 Other allergic rhinitis: Secondary | ICD-10-CM | POA: Diagnosis not present

## 2015-02-16 DIAGNOSIS — J301 Allergic rhinitis due to pollen: Secondary | ICD-10-CM | POA: Diagnosis not present

## 2015-02-16 DIAGNOSIS — J3089 Other allergic rhinitis: Secondary | ICD-10-CM | POA: Diagnosis not present

## 2015-02-23 DIAGNOSIS — J301 Allergic rhinitis due to pollen: Secondary | ICD-10-CM | POA: Diagnosis not present

## 2015-02-23 DIAGNOSIS — J3089 Other allergic rhinitis: Secondary | ICD-10-CM | POA: Diagnosis not present

## 2015-03-02 DIAGNOSIS — J301 Allergic rhinitis due to pollen: Secondary | ICD-10-CM | POA: Diagnosis not present

## 2015-03-02 DIAGNOSIS — J3089 Other allergic rhinitis: Secondary | ICD-10-CM | POA: Diagnosis not present

## 2015-03-09 DIAGNOSIS — J3089 Other allergic rhinitis: Secondary | ICD-10-CM | POA: Diagnosis not present

## 2015-03-09 DIAGNOSIS — J301 Allergic rhinitis due to pollen: Secondary | ICD-10-CM | POA: Diagnosis not present

## 2015-03-10 ENCOUNTER — Ambulatory Visit (INDEPENDENT_AMBULATORY_CARE_PROVIDER_SITE_OTHER): Payer: Medicare Other | Admitting: Endocrinology

## 2015-03-10 ENCOUNTER — Telehealth: Payer: Self-pay | Admitting: Endocrinology

## 2015-03-10 ENCOUNTER — Encounter: Payer: Self-pay | Admitting: Endocrinology

## 2015-03-10 VITALS — BP 130/82 | HR 61 | Temp 97.9°F | Ht 74.0 in | Wt 227.0 lb

## 2015-03-10 DIAGNOSIS — E119 Type 2 diabetes mellitus without complications: Secondary | ICD-10-CM

## 2015-03-10 DIAGNOSIS — E291 Testicular hypofunction: Secondary | ICD-10-CM | POA: Diagnosis not present

## 2015-03-10 DIAGNOSIS — I950 Idiopathic hypotension: Secondary | ICD-10-CM | POA: Diagnosis not present

## 2015-03-10 LAB — BASIC METABOLIC PANEL
BUN: 21 mg/dL (ref 6–23)
CALCIUM: 9.1 mg/dL (ref 8.4–10.5)
CO2: 31 meq/L (ref 19–32)
CREATININE: 1.21 mg/dL (ref 0.40–1.50)
Chloride: 102 mEq/L (ref 96–112)
GFR: 74.57 mL/min (ref >60.00)
GLUCOSE: 96 mg/dL (ref 70–99)
Potassium: 3.6 mEq/L (ref 3.5–5.1)
SODIUM: 137 meq/L (ref 135–145)

## 2015-03-10 LAB — TESTOSTERONE: Testosterone: 200.69 ng/dL — ABNORMAL LOW (ref 300.00–890.00)

## 2015-03-10 LAB — HEMOGLOBIN A1C: HEMOGLOBIN A1C: 6.2 % (ref 4.6–6.5)

## 2015-03-10 MED ORDER — AMOXICILLIN 250 MG PO CAPS: 250.0000 mg | ORAL_CAPSULE | Freq: Three times a day (TID) | ORAL | Status: DC

## 2015-03-10 NOTE — Telephone Encounter (Signed)
Patient is at the pharmacy stated medication haven't been sent, please send it to [facility]

## 2015-03-10 NOTE — Progress Notes (Signed)
Subjective:    Patient ID: Joseph Rocha, male    DOB: 05/23/1935, 80 y.o.   MRN: [id number]  HPI Pt states few days of moderate pain at the throat, but no assoc fever.   Past Medical History  Diagnosis Date  . Diabetes mellitus   . Hyperlipidemia   . Anemia   . Hypertension   . Allergy   . Pituitary abnormality 2004  . OA (osteoarthritis)   . Elevated PSA   . Hypogonadism male     Past Surgical History  Procedure Laterality Date  . Lumbar disc surgery    . Knee arthroscopy Left   . Transphenoidal / transnasal hypophysectomy / resection pituitary tumor  2005  . Electrocardiogram  03/25/2006  . Doppler echocardiography  12/04/2001  . Meniscus repair Right may 2015  . Tonsillectomy      History   Social History  . Marital Status: Married    Spouse Name: N/A  . Number of Children: 2  . Years of Education: N/A   Occupational History  . retired    Social History Main Topics  . Smoking status: Former Smoker    Quit date: 02/22/1957  . Smokeless tobacco: Never Used  . Alcohol Use: No  . Drug Use: No  . Sexual Activity: Not Currently   Other Topics Concern  . Not on file   Social History Narrative   Lives with wife.  They have two grown children.   He is retired from the [employer].    Current Outpatient Prescriptions on File Prior to Visit  Medication Sig Dispense Refill  . aspirin 81 MG tablet Take 81 mg by mouth daily.    . calcium carbonate (OS-CAL) 600 MG TABS Take 600 mg by mouth daily.    . cetirizine (ZYRTEC) 10 MG tablet Take 10 mg by mouth daily.    . Cholecalciferol (VITAMIN D3 PO) Take 1 capsule by mouth daily. 657mcg    . finasteride (PROSCAR) 5 MG tablet Take 5 mg by mouth daily.      . fish oil-omega-3 fatty acids 1000 MG capsule Take 1 g by mouth daily.    . fludrocortisone (FLORINEF) 0.1 MG tablet Take 1 tablet (0.1 mg total) by mouth 2 (two) times daily. 60 tablet 11  . folic acid (FOLVITE) 1 MG tablet TAKE 1 TABLET DAILY 90 tablet 3    . KLOR-CON M20 20 MEQ tablet Take 20 mEq by mouth daily.     . Magnesium Oxide 400 (240 MG) MG TABS Take 1 tablet by mouth 2 (two) times daily. 180 tablet 3  . meloxicam (MOBIC) 15 MG tablet Take 1 tablet (15 mg total) by mouth daily. 30 tablet 8  . methocarbamol (ROBAXIN) 500 MG tablet Take 500 mg by mouth at bedtime as needed for muscle spasms.    . Multiple Vitamins-Minerals (CENTRUM SILVER PO) Take 1 tablet by mouth daily.      Marland Kitchen omeprazole (PRILOSEC) 40 MG capsule Take 1 capsule (40 mg total) by mouth daily. 30 capsule 11  . ondansetron (ZOFRAN) 4 MG tablet Take 1 tablet (4 mg total) by mouth every 8 (eight) hours as needed for nausea or vomiting. 36 tablet 1  . pioglitazone (ACTOS) 45 MG tablet TAKE 1 TABLET DAILY 90 tablet 2  . pravastatin (PRAVACHOL) 80 MG tablet Take 1 tablet (80 mg total) by mouth daily. 90 tablet 3  . Tamsulosin HCl (FLOMAX) 0.4 MG CAPS Take 0.4 mg by mouth daily.      Marland Kitchen  Testosterone (AXIRON) 30 MG/ACT SOLN Place onto the skin.     No current facility-administered medications on file prior to visit.    Allergies  Allergen Reactions  . Lipitor [Atorvastatin]     myalgias    Family History  Problem Relation Age of Onset  . Cancer Brother     uncertain type  . Diabetes Mellitus II Mother     Deceased, 43s  . Heart attack Father   . Healthy Daughter     BP 130/82 mmHg  Pulse 61  Temp(Src) 97.9 F (36.6 C) (Oral)  Ht 6\' 2"  (1.88 m)  Wt 227 lb (102.967 kg)  BMI 29.13 kg/m2  SpO2 95%  Review of Systems Denies cough and earache.  No recent LOC    Objective:   Physical Exam VITAL SIGNS:  See vs page GENERAL: no distress head: no deformity eyes: no periorbital swelling, no proptosis external nose and ears are normal.  mouth: no lesion seen. Both eac's and tm's are normal LUNGS:  Clear to auscultation.   Lab Results  Component Value Date   HGBA1C 6.2 03/10/2015   Lab Results  Component Value Date   TESTOSTERONE 200.69* 03/10/2015       Assessment & Plan:  URI: new Hypogonadism: therapy limited by noncompliance.  i'll do the best i can. DM: well-controlled. Recurrent syncope: better recently  Patient is advised the following: Patient Instructions  i have sent a prescription to your pharmacy, for an antibiotic pill.  blood tests are requested for you today.  We'll let you know about the results.    addendum: Please continue the same medications. Do you want to refill of the "axiron?"

## 2015-03-10 NOTE — Patient Instructions (Addendum)
i have sent a prescription to your pharmacy, for an antibiotic pill.  blood tests are requested for you today.  We'll let you know about the results.

## 2015-03-11 ENCOUNTER — Telehealth: Payer: Self-pay | Admitting: Endocrinology

## 2015-03-11 ENCOUNTER — Other Ambulatory Visit: Payer: Self-pay

## 2015-03-11 MED ORDER — PROMETHAZINE-CODEINE 6.25-10 MG/5ML PO SYRP: 5.0000 mL | ORAL_SOLUTION | ORAL | Status: DC | PRN

## 2015-03-11 MED ORDER — AMOXICILLIN 250 MG PO CAPS: 250.0000 mg | ORAL_CAPSULE | Freq: Three times a day (TID) | ORAL | Status: DC

## 2015-03-11 NOTE — Telephone Encounter (Signed)
Pt is waitigon phone call about prescriptions please call [phone]

## 2015-03-11 NOTE — Telephone Encounter (Signed)
I contacted the patient he stated he is still having severe coughing and wanted to know if we could call him in a Z-pack since he is going to be out of town next week. Please advise, Thanks!

## 2015-03-11 NOTE — Telephone Encounter (Signed)
I was unable to locate rx for cough syrup. Could we re-print? Thanks!

## 2015-03-11 NOTE — Telephone Encounter (Signed)
Rx faxed to pharmacy. Pt notified.

## 2015-03-11 NOTE — Telephone Encounter (Signed)
Patient called and would not leave a detailed message  Please call back   Thank you

## 2015-03-11 NOTE — Telephone Encounter (Signed)
I contacted patient and advised Dr. Loanne Drilling has sent in a Rx for cough syrup. Pt voiced understanding.

## 2015-03-11 NOTE — Telephone Encounter (Signed)
Pt called said his prescriptions was sent to the wrong place and and [facility] says they cant fill it because it is too soon , pt is leaving to go out of town in morning please call pt back asap @ [phone]

## 2015-03-11 NOTE — Telephone Encounter (Signed)
i printed 

## 2015-03-11 NOTE — Telephone Encounter (Signed)
i printed rx four cough syrup. abx was sent yesterday

## 2015-03-11 NOTE — Telephone Encounter (Signed)
I contacted [facility] and cancelled the pt's amoxicillin. Pt was able to pick up the rx from [facility].

## 2015-03-11 NOTE — Telephone Encounter (Signed)
I contacted the patient. Pt's antibiotic was originally sent to [facility]. Rx has been corrected and sen to [facility] on [address]. Pt notified.

## 2015-03-23 DIAGNOSIS — J301 Allergic rhinitis due to pollen: Secondary | ICD-10-CM | POA: Diagnosis not present

## 2015-03-23 DIAGNOSIS — J3089 Other allergic rhinitis: Secondary | ICD-10-CM | POA: Diagnosis not present

## 2015-03-31 DIAGNOSIS — J3089 Other allergic rhinitis: Secondary | ICD-10-CM | POA: Diagnosis not present

## 2015-03-31 DIAGNOSIS — J301 Allergic rhinitis due to pollen: Secondary | ICD-10-CM | POA: Diagnosis not present

## 2015-04-06 DIAGNOSIS — J301 Allergic rhinitis due to pollen: Secondary | ICD-10-CM | POA: Diagnosis not present

## 2015-04-06 DIAGNOSIS — J3089 Other allergic rhinitis: Secondary | ICD-10-CM | POA: Diagnosis not present

## 2015-04-13 DIAGNOSIS — J301 Allergic rhinitis due to pollen: Secondary | ICD-10-CM | POA: Diagnosis not present

## 2015-04-13 DIAGNOSIS — J3089 Other allergic rhinitis: Secondary | ICD-10-CM | POA: Diagnosis not present

## 2015-04-19 DIAGNOSIS — J301 Allergic rhinitis due to pollen: Secondary | ICD-10-CM | POA: Diagnosis not present

## 2015-04-19 DIAGNOSIS — J3089 Other allergic rhinitis: Secondary | ICD-10-CM | POA: Diagnosis not present

## 2015-04-27 DIAGNOSIS — J301 Allergic rhinitis due to pollen: Secondary | ICD-10-CM | POA: Diagnosis not present

## 2015-05-10 DIAGNOSIS — J3089 Other allergic rhinitis: Secondary | ICD-10-CM | POA: Diagnosis not present

## 2015-05-10 DIAGNOSIS — J301 Allergic rhinitis due to pollen: Secondary | ICD-10-CM | POA: Diagnosis not present

## 2015-05-18 DIAGNOSIS — J3089 Other allergic rhinitis: Secondary | ICD-10-CM | POA: Diagnosis not present

## 2015-05-18 DIAGNOSIS — J301 Allergic rhinitis due to pollen: Secondary | ICD-10-CM | POA: Diagnosis not present

## 2015-05-26 DIAGNOSIS — J301 Allergic rhinitis due to pollen: Secondary | ICD-10-CM | POA: Diagnosis not present

## 2015-05-26 DIAGNOSIS — J3089 Other allergic rhinitis: Secondary | ICD-10-CM | POA: Diagnosis not present

## 2015-06-01 DIAGNOSIS — J301 Allergic rhinitis due to pollen: Secondary | ICD-10-CM | POA: Diagnosis not present

## 2015-06-01 DIAGNOSIS — J3089 Other allergic rhinitis: Secondary | ICD-10-CM | POA: Diagnosis not present

## 2015-06-02 DIAGNOSIS — J3089 Other allergic rhinitis: Secondary | ICD-10-CM | POA: Diagnosis not present

## 2015-06-02 DIAGNOSIS — J301 Allergic rhinitis due to pollen: Secondary | ICD-10-CM | POA: Diagnosis not present

## 2015-06-08 DIAGNOSIS — J3089 Other allergic rhinitis: Secondary | ICD-10-CM | POA: Diagnosis not present

## 2015-06-08 DIAGNOSIS — J301 Allergic rhinitis due to pollen: Secondary | ICD-10-CM | POA: Diagnosis not present

## 2015-06-14 DIAGNOSIS — J301 Allergic rhinitis due to pollen: Secondary | ICD-10-CM | POA: Diagnosis not present

## 2015-06-27 DIAGNOSIS — R351 Nocturia: Secondary | ICD-10-CM | POA: Diagnosis not present

## 2015-06-27 DIAGNOSIS — E291 Testicular hypofunction: Secondary | ICD-10-CM | POA: Diagnosis not present

## 2015-06-27 DIAGNOSIS — N401 Enlarged prostate with lower urinary tract symptoms: Secondary | ICD-10-CM | POA: Diagnosis not present

## 2015-06-29 ENCOUNTER — Telehealth: Payer: Self-pay | Admitting: Endocrinology

## 2015-06-29 NOTE — Telephone Encounter (Signed)
Received records from [facility], sent to Dr. Ellison. 06/29/15/ss °

## 2015-06-30 DIAGNOSIS — J3089 Other allergic rhinitis: Secondary | ICD-10-CM | POA: Diagnosis not present

## 2015-06-30 DIAGNOSIS — J301 Allergic rhinitis due to pollen: Secondary | ICD-10-CM | POA: Diagnosis not present

## 2015-07-06 DIAGNOSIS — J301 Allergic rhinitis due to pollen: Secondary | ICD-10-CM | POA: Diagnosis not present

## 2015-07-06 DIAGNOSIS — J3089 Other allergic rhinitis: Secondary | ICD-10-CM | POA: Diagnosis not present

## 2015-07-20 DIAGNOSIS — J3089 Other allergic rhinitis: Secondary | ICD-10-CM | POA: Diagnosis not present

## 2015-07-20 DIAGNOSIS — J301 Allergic rhinitis due to pollen: Secondary | ICD-10-CM | POA: Diagnosis not present

## 2015-07-27 ENCOUNTER — Other Ambulatory Visit (INDEPENDENT_AMBULATORY_CARE_PROVIDER_SITE_OTHER): Payer: Medicare Other

## 2015-07-27 ENCOUNTER — Ambulatory Visit (INDEPENDENT_AMBULATORY_CARE_PROVIDER_SITE_OTHER): Payer: Medicare Other | Admitting: Endocrinology

## 2015-07-27 ENCOUNTER — Encounter: Payer: Self-pay | Admitting: Endocrinology

## 2015-07-27 VITALS — BP 138/82 | HR 55 | Temp 97.7°F | Ht 74.0 in | Wt 224.0 lb

## 2015-07-27 DIAGNOSIS — R972 Elevated prostate specific antigen [PSA]: Secondary | ICD-10-CM | POA: Diagnosis not present

## 2015-07-27 DIAGNOSIS — E119 Type 2 diabetes mellitus without complications: Secondary | ICD-10-CM | POA: Diagnosis not present

## 2015-07-27 DIAGNOSIS — D649 Anemia, unspecified: Secondary | ICD-10-CM

## 2015-07-27 DIAGNOSIS — J3089 Other allergic rhinitis: Secondary | ICD-10-CM | POA: Diagnosis not present

## 2015-07-27 DIAGNOSIS — I950 Idiopathic hypotension: Secondary | ICD-10-CM

## 2015-07-27 DIAGNOSIS — Z0000 Encounter for general adult medical examination without abnormal findings: Secondary | ICD-10-CM

## 2015-07-27 DIAGNOSIS — E291 Testicular hypofunction: Secondary | ICD-10-CM

## 2015-07-27 DIAGNOSIS — J301 Allergic rhinitis due to pollen: Secondary | ICD-10-CM | POA: Diagnosis not present

## 2015-07-27 LAB — HEPATIC FUNCTION PANEL
ALBUMIN: 3.8 g/dL (ref 3.5–5.2)
ALT: 15 U/L (ref 0–53)
AST: 22 U/L (ref 0–37)
Alkaline Phosphatase: 51 U/L (ref 39–117)
Bilirubin, Direct: 0.1 mg/dL (ref 0.0–0.3)
Total Bilirubin: 0.4 mg/dL (ref 0.2–1.2)
Total Protein: 7 g/dL (ref 6.0–8.3)

## 2015-07-27 LAB — BASIC METABOLIC PANEL
BUN: 28 mg/dL — ABNORMAL HIGH (ref 6–23)
CALCIUM: 9.3 mg/dL (ref 8.4–10.5)
CO2: 32 mEq/L (ref 19–32)
Chloride: 102 mEq/L (ref 96–112)
Creatinine, Ser: 1.35 mg/dL (ref 0.40–1.50)
GFR: 65.65 mL/min (ref >60.00)
Glucose, Bld: 95 mg/dL (ref 70–99)
Potassium: 4 mEq/L (ref 3.5–5.1)
SODIUM: 137 meq/L (ref 135–145)

## 2015-07-27 LAB — CBC WITH DIFFERENTIAL/PLATELET
BASOS PCT: 0.8 % (ref 0.0–3.0)
Basophils Absolute: 0.1 10*3/uL (ref 0.0–0.1)
EOS PCT: 4.3 % (ref 0.0–5.0)
Eosinophils Absolute: 0.3 10*3/uL (ref 0.0–0.7)
HEMATOCRIT: 35.5 % — AB (ref 39.0–52.0)
HEMOGLOBIN: 11.8 g/dL — AB (ref 13.0–17.0)
LYMPHS PCT: 40 % (ref 12.0–46.0)
Lymphs Abs: 2.5 10*3/uL (ref 0.7–4.0)
MCHC: 33.3 g/dL (ref 30.0–36.0)
MCV: 91.7 fl (ref 78.0–100.0)
MONO ABS: 0.6 10*3/uL (ref 0.1–1.0)
Monocytes Relative: 10.2 % (ref 3.0–12.0)
Neutro Abs: 2.7 10*3/uL (ref 1.4–7.7)
Neutrophils Relative %: 44.7 % (ref 43.0–77.0)
Platelets: 192 10*3/uL (ref 150.0–400.0)
RBC: 3.87 Mil/uL — ABNORMAL LOW (ref 4.22–5.81)
RDW: 14.8 % (ref 11.5–15.5)
WBC: 6.1 10*3/uL (ref 4.0–10.5)

## 2015-07-27 LAB — MICROALBUMIN / CREATININE URINE RATIO
CREATININE, U: 170.4 mg/dL
Microalb Creat Ratio: 0.4 mg/g (ref 0.0–30.0)

## 2015-07-27 LAB — POCT GLYCOSYLATED HEMOGLOBIN (HGB A1C): HEMOGLOBIN A1C: 5.9

## 2015-07-27 LAB — HEMOGLOBIN A1C: Hgb A1c MFr Bld: 6.2 % (ref 4.6–6.5)

## 2015-07-27 LAB — LIPID PANEL
CHOL/HDL RATIO: 8
Cholesterol: 309 mg/dL — ABNORMAL HIGH (ref 0–200)
HDL: 40 mg/dL (ref >39.00)
LDL CALC: 243 mg/dL — AB (ref 0–99)
NONHDL: 269.32
Triglycerides: 130 mg/dL (ref 0.0–149.0)
VLDL: 26 mg/dL (ref 0.0–40.0)

## 2015-07-27 LAB — IBC PANEL
Iron: 75 ug/dL (ref 42–165)
Saturation Ratios: 24 % (ref 20.0–50.0)
Transferrin: 223 mg/dL (ref 212.0–360.0)

## 2015-07-27 MED ORDER — MELOXICAM 15 MG PO TABS: 15.0000 mg | ORAL_TABLET | Freq: Every day | ORAL | Status: DC

## 2015-07-27 MED ORDER — LACTULOSE 20 GM/30ML PO SOLN: 30.0000 mL | Freq: Two times a day (BID) | ORAL | Status: DC

## 2015-07-27 NOTE — Patient Instructions (Addendum)
please consider these measures for your health:  minimize alcohol.  do not use tobacco products.  have a colonoscopy at least every 10 years from age 80.    keep firearms safely stored.  always use seat belts.  have working smoke alarms in your home.  see an eye doctor and dentist regularly.  never drive under the influence of alcohol or drugs (including prescription drugs).   blood tests are requested for you today.  We'll let you know about the results. it is critically important to prevent falling down (keep floor areas well-lit, dry, and free of loose objects.  If you have a cane, walker, or wheelchair, you should use it, even for short trips around the house.  Also, try not to rush). i have sent a prescription to your pharmacy, for the bowels. Please return in 1 year.  Please continue the same meloxicam, as needed for the back pain.

## 2015-07-27 NOTE — Progress Notes (Signed)
we discussed code status.  pt requests full code, but would not want to be started or maintained on artificial life-support measures if there was not a reasonable chance of recovery 

## 2015-07-27 NOTE — Progress Notes (Signed)
Subjective:    Patient ID: Joseph Rocha, male    DOB: [birth year], 80 y.o.   MRN: [id number]  HPI Pt states few weeks of moderate pain at the left lower back, but no assoc numbness.   Past Medical History  Diagnosis Date  . Diabetes mellitus   . Hyperlipidemia   . Anemia   . Hypertension   . Allergy   . Pituitary abnormality 2004  . OA (osteoarthritis)   . Elevated PSA   . Hypogonadism male     Past Surgical History  Procedure Laterality Date  . Lumbar disc surgery    . Knee arthroscopy Left   . Transphenoidal / transnasal hypophysectomy / resection pituitary tumor  2005  . Electrocardiogram  03/25/2006  . Doppler echocardiography  12/04/2001  . Meniscus repair Right may 2015  . Tonsillectomy      Social History   Social History  . Marital Status: Married    Spouse Name: N/A  . Number of Children: 2  . Years of Education: N/A   Occupational History  . retired    Social History Main Topics  . Smoking status: Former Smoker    Quit date: 02/22/1957  . Smokeless tobacco: Never Used  . Alcohol Use: No  . Drug Use: No  . Sexual Activity: Not Currently   Other Topics Concern  . Not on file   Social History Narrative   Lives with wife.  They have two grown children.   He is retired from the [employer].    Current Outpatient Prescriptions on File Prior to Visit  Medication Sig Dispense Refill  . aspirin 81 MG tablet Take 81 mg by mouth daily.    . calcium carbonate (OS-CAL) 600 MG TABS Take 600 mg by mouth daily.    . cetirizine (ZYRTEC) 10 MG tablet Take 10 mg by mouth daily.    . Cholecalciferol (VITAMIN D3 PO) Take 1 capsule by mouth daily. 635mcg    . finasteride (PROSCAR) 5 MG tablet Take 5 mg by mouth daily.      . fish oil-omega-3 fatty acids 1000 MG capsule Take 1 g by mouth daily.    . fludrocortisone (FLORINEF) 0.1 MG tablet Take 1 tablet (0.1 mg total) by mouth 2 (two) times daily. 60 tablet 11  . folic acid (FOLVITE) 1 MG tablet TAKE 1  TABLET DAILY 90 tablet 3  . KLOR-CON M20 20 MEQ tablet Take 20 mEq by mouth daily.     . Magnesium Oxide 400 (240 MG) MG TABS Take 1 tablet by mouth 2 (two) times daily. 180 tablet 3  . methocarbamol (ROBAXIN) 500 MG tablet Take 500 mg by mouth at bedtime as needed for muscle spasms.    . Multiple Vitamins-Minerals (CENTRUM SILVER PO) Take 1 tablet by mouth daily.      Marland Kitchen omeprazole (PRILOSEC) 40 MG capsule Take 1 capsule (40 mg total) by mouth daily. 30 capsule 11  . ondansetron (ZOFRAN) 4 MG tablet Take 1 tablet (4 mg total) by mouth every 8 (eight) hours as needed for nausea or vomiting. 36 tablet 1  . pioglitazone (ACTOS) 45 MG tablet TAKE 1 TABLET DAILY 90 tablet 2  . Tamsulosin HCl (FLOMAX) 0.4 MG CAPS Take 0.4 mg by mouth daily.      . Testosterone (AXIRON) 30 MG/ACT SOLN Place onto the skin.     No current facility-administered medications on file prior to visit.    Allergies  Allergen Reactions  . Lipitor [Atorvastatin]  myalgias    Family History  Problem Relation Age of Onset  . Cancer Brother     uncertain type  . Diabetes Mellitus II Mother     Deceased, 3s  . Heart attack Father   . Healthy Daughter     BP 138/82 mmHg  Pulse 55  Temp(Src) 97.7 F (36.5 C) (Oral)  Ht 6\' 2"  (1.88 m)  Wt 224 lb (101.606 kg)  BMI 28.75 kg/m2  SpO2 97%    Review of Systems He has chronic constipation.  He had 1 episode of n/v 5 days ago, but none since.      Objective:   Physical Exam VITAL SIGNS:  See vs page GENERAL: no distress Spine: nontender.   Gait: steady with a cane.  Neuro: sensation is intact to touch on the legs.   Lab Results  Component Value Date   CHOL 309* 07/27/2015   HDL 40.00 07/27/2015   LDLCALC 243* 07/27/2015   TRIG 130.0 07/27/2015   CHOLHDL 8 07/27/2015   Lab Results  Component Value Date   TSH 4.93* 07/27/2015   Lab Results  Component Value Date   WBC 6.1 07/27/2015   HGB 11.8* 07/27/2015   HCT 35.5* 07/27/2015   MCV 91.7  07/27/2015   PLT 192.0 07/27/2015       Assessment & Plan:  Low back pain, recurrent Constipation, possibly due to the pain Anemia, stable, uncertain etiology Hypothyroidism, new, mild Dyslipidemia: therapy limited by noncompliance.  i'll do the best i can.   i have sent a prescription to your pharmacy, for the bowels. Please continue the same meloxicam, as needed for the back pain.  Anemia is mild again. We'll follow the anemia. Please try taking the pravachol.  i have sent a prescription to your pharmacy.   We'll recheck the thyroid next time.  Subjective:   Patient here for Medicare annual wellness visit and management of other chronic and acute problems.     Risk factors: advanced age    39 of Physicians Providing Medical Care to Patient:  See "snapshot"   Activities of Daily Living: In your present state of health, do you have any difficulty performing the following activities?:  Preparing food and eating?: No  Bathing yourself: No  Getting dressed: No  Using the toilet:No  Moving around from place to place: No  In the past year have you fallen or had a near fall?: No    Home Safety: Has smoke detector and wears seat belts. No firearms.  Diet and Exercise  Current exercise habits: pt says good Dietary issues discussed: pt reports a healthy diet   Depression Screen  Q1: Over the past two weeks, have you felt down, depressed or hopeless? no  Q2: Over the past two weeks, have you felt little interest or pleasure in doing things? no   The following portions of the patient's history were reviewed and updated as appropriate: allergies, current medications, past family history, past medical history, past social history, past surgical history and problem list.   Review of Systems  Denies hearing loss, and visual loss Objective:   Vision:  Advertising account executive, but does not recall name.   Hearing: grossly normal Body mass index:  See vs page Msk: pt easily and  quickly performs "get-up-and-go" from a sitting position Cognitive Impairment Assessment: cognition, memory and judgment appear normal.  remembers 3/3 at 5 minutes.  excellent recall.  can easily read and write a sentence.  alert and oriented x 3  Assessment:   Medicare wellness utd on preventive parameters    Plan:   During the course of the visit the patient was educated and counseled about appropriate screening and preventive services including:        Fall prevention  Diabetes screening  Nutrition counseling   Vaccines / LABS Zostavax / Pneumococcal Vaccine today  Patient Instructions (the written plan) was given to the patient.

## 2015-07-28 LAB — TSH: TSH: 4.93 u[IU]/mL — ABNORMAL HIGH (ref 0.35–4.50)

## 2015-07-28 MED ORDER — PRAVASTATIN SODIUM 80 MG PO TABS: 80.0000 mg | ORAL_TABLET | Freq: Every day | ORAL | Status: DC

## 2015-08-03 DIAGNOSIS — J3089 Other allergic rhinitis: Secondary | ICD-10-CM | POA: Diagnosis not present

## 2015-08-03 DIAGNOSIS — J301 Allergic rhinitis due to pollen: Secondary | ICD-10-CM | POA: Diagnosis not present

## 2015-08-10 DIAGNOSIS — J301 Allergic rhinitis due to pollen: Secondary | ICD-10-CM | POA: Diagnosis not present

## 2015-08-10 DIAGNOSIS — J3089 Other allergic rhinitis: Secondary | ICD-10-CM | POA: Diagnosis not present

## 2015-08-15 ENCOUNTER — Other Ambulatory Visit: Payer: Self-pay

## 2015-08-15 MED ORDER — OMEPRAZOLE 40 MG PO CPDR: 40.0000 mg | DELAYED_RELEASE_CAPSULE | Freq: Every day | ORAL | Status: DC

## 2015-08-17 DIAGNOSIS — J301 Allergic rhinitis due to pollen: Secondary | ICD-10-CM | POA: Diagnosis not present

## 2015-08-17 DIAGNOSIS — J3089 Other allergic rhinitis: Secondary | ICD-10-CM | POA: Diagnosis not present

## 2015-08-23 DIAGNOSIS — J301 Allergic rhinitis due to pollen: Secondary | ICD-10-CM | POA: Diagnosis not present

## 2015-08-23 DIAGNOSIS — J3089 Other allergic rhinitis: Secondary | ICD-10-CM | POA: Diagnosis not present

## 2015-09-02 DIAGNOSIS — J301 Allergic rhinitis due to pollen: Secondary | ICD-10-CM | POA: Diagnosis not present

## 2015-09-02 DIAGNOSIS — J3089 Other allergic rhinitis: Secondary | ICD-10-CM | POA: Diagnosis not present

## 2015-09-06 ENCOUNTER — Other Ambulatory Visit: Payer: Self-pay

## 2015-09-06 MED ORDER — PRAVASTATIN SODIUM 80 MG PO TABS: 80.0000 mg | ORAL_TABLET | Freq: Every day | ORAL | Status: DC

## 2015-09-06 MED ORDER — PIOGLITAZONE HCL 45 MG PO TABS: 45.0000 mg | ORAL_TABLET | Freq: Every day | ORAL | Status: DC

## 2015-09-08 DIAGNOSIS — J3089 Other allergic rhinitis: Secondary | ICD-10-CM | POA: Diagnosis not present

## 2015-09-08 DIAGNOSIS — J301 Allergic rhinitis due to pollen: Secondary | ICD-10-CM | POA: Diagnosis not present

## 2015-09-22 DIAGNOSIS — J301 Allergic rhinitis due to pollen: Secondary | ICD-10-CM | POA: Diagnosis not present

## 2015-09-22 DIAGNOSIS — J3089 Other allergic rhinitis: Secondary | ICD-10-CM | POA: Diagnosis not present

## 2015-09-26 ENCOUNTER — Other Ambulatory Visit: Payer: Self-pay | Admitting: *Deleted

## 2015-09-26 MED ORDER — OMEPRAZOLE 40 MG PO CPDR
40.0000 mg | DELAYED_RELEASE_CAPSULE | Freq: Every day | ORAL | Status: DC
Start: 2015-09-26 — End: 2015-10-19

## 2015-09-27 DIAGNOSIS — J3089 Other allergic rhinitis: Secondary | ICD-10-CM | POA: Diagnosis not present

## 2015-09-27 DIAGNOSIS — J301 Allergic rhinitis due to pollen: Secondary | ICD-10-CM | POA: Diagnosis not present

## 2015-10-05 DIAGNOSIS — J3089 Other allergic rhinitis: Secondary | ICD-10-CM | POA: Diagnosis not present

## 2015-10-05 DIAGNOSIS — J301 Allergic rhinitis due to pollen: Secondary | ICD-10-CM | POA: Diagnosis not present

## 2015-10-12 DIAGNOSIS — J301 Allergic rhinitis due to pollen: Secondary | ICD-10-CM | POA: Diagnosis not present

## 2015-10-12 DIAGNOSIS — J3089 Other allergic rhinitis: Secondary | ICD-10-CM | POA: Diagnosis not present

## 2015-10-19 ENCOUNTER — Ambulatory Visit (INDEPENDENT_AMBULATORY_CARE_PROVIDER_SITE_OTHER): Payer: Medicare Other | Admitting: Gastroenterology

## 2015-10-19 ENCOUNTER — Encounter: Payer: Self-pay | Admitting: Gastroenterology

## 2015-10-19 VITALS — BP 118/80 | HR 61 | Ht 75.0 in | Wt 229.8 lb

## 2015-10-19 DIAGNOSIS — K219 Gastro-esophageal reflux disease without esophagitis: Secondary | ICD-10-CM | POA: Diagnosis not present

## 2015-10-19 DIAGNOSIS — K5900 Constipation, unspecified: Secondary | ICD-10-CM

## 2015-10-19 DIAGNOSIS — J301 Allergic rhinitis due to pollen: Secondary | ICD-10-CM | POA: Diagnosis not present

## 2015-10-19 DIAGNOSIS — J3089 Other allergic rhinitis: Secondary | ICD-10-CM | POA: Diagnosis not present

## 2015-10-19 MED ORDER — POLYETHYLENE GLYCOL 3350 17 GM/SCOOP PO POWD: ORAL | Status: DC

## 2015-10-19 MED ORDER — OMEPRAZOLE 40 MG PO CPDR: 40.0000 mg | DELAYED_RELEASE_CAPSULE | Freq: Every day | ORAL | Status: DC

## 2015-10-19 NOTE — Progress Notes (Signed)
    History of Present Illness: This is a 80 year old male returning for follow-up of constipation and GERD. He tried lactulose however this lead to worsening intestinal gas. Colonoscopy performed in February 2012 for average risk screening showed mild sigmoid colon diverticulosis and internal hemorrhoids. Reflux symptoms are under very good control on daily omeprazole. Last visit was in September 2015 when he had a decreased appetite, nausea, mild weight loss and constipation. He was taking NSAIDs and these were discontinued. He also had a normocytic anemia without clear etiology. EGD was recommended however he did not follow through with this procedure. He states his weight and appetite have stabilized. He takes Mobic and ASA daily. Denies weight loss, abdominal pain, diarrhea, change in stool caliber, melena, hematochezia, nausea, vomiting, dysphagia, chest pain.  Current Medications, Allergies, Past Medical History, Past Surgical History, Family History and Social History were reviewed in [employer] record.  Physical Exam: General: Well developed, well nourished, no acute distress Head: Normocephalic and atraumatic Eyes:  sclerae anicteric, EOMI Ears: Normal auditory acuity Mouth: No deformity or lesions Lungs: Clear throughout to auscultation Heart: Regular rate and rhythm; no murmurs, rubs or bruits Abdomen: Soft, non tender and non distended. No masses, hepatosplenomegaly or hernias noted. Normal Bowel sounds Musculoskeletal: Symmetrical with no gross deformities  Pulses:  Normal pulses noted Extremities: No clubbing, cyanosis, edema or deformities noted Neurological: Alert oriented x 4, grossly nonfocal Psychological:  Alert and cooperative. Normal mood and affect  Assessment and Recommendations:  1. Constipation. Begin Glycolax daily. May increase to twice daily if symptoms not adequately managed.  2. GERD. Refill omeprazole 40 mg po qam. Follow standard  antireflux measures.

## 2015-10-19 NOTE — Patient Instructions (Signed)
We have sent the following medications to your pharmacy for you to pick up at your convenience:omeprazole and Miralax.  Thank you for choosing me and [facility].  Pricilla Riffle. Dagoberto Ligas., MD., Marval Regal

## 2015-10-25 DIAGNOSIS — J3089 Other allergic rhinitis: Secondary | ICD-10-CM | POA: Diagnosis not present

## 2015-10-25 DIAGNOSIS — J301 Allergic rhinitis due to pollen: Secondary | ICD-10-CM | POA: Diagnosis not present

## 2015-11-09 ENCOUNTER — Other Ambulatory Visit: Payer: Self-pay

## 2015-11-09 DIAGNOSIS — J3089 Other allergic rhinitis: Secondary | ICD-10-CM | POA: Diagnosis not present

## 2015-11-09 DIAGNOSIS — J301 Allergic rhinitis due to pollen: Secondary | ICD-10-CM | POA: Diagnosis not present

## 2015-11-09 MED ORDER — FLUDROCORTISONE ACETATE 0.1 MG PO TABS: 0.1000 mg | ORAL_TABLET | Freq: Two times a day (BID) | ORAL | Status: DC

## 2015-11-09 MED ORDER — FOLIC ACID 1 MG PO TABS: 1.0000 mg | ORAL_TABLET | Freq: Every day | ORAL | Status: DC

## 2015-11-10 ENCOUNTER — Other Ambulatory Visit: Payer: Self-pay

## 2015-11-10 MED ORDER — FOLIC ACID 1 MG PO TABS: 1.0000 mg | ORAL_TABLET | Freq: Every day | ORAL | Status: DC

## 2015-11-10 MED ORDER — FLUDROCORTISONE ACETATE 0.1 MG PO TABS: 0.1000 mg | ORAL_TABLET | Freq: Two times a day (BID) | ORAL | Status: DC

## 2015-11-17 DIAGNOSIS — J301 Allergic rhinitis due to pollen: Secondary | ICD-10-CM | POA: Diagnosis not present

## 2015-11-17 DIAGNOSIS — J3089 Other allergic rhinitis: Secondary | ICD-10-CM | POA: Diagnosis not present

## 2015-11-25 DIAGNOSIS — J3089 Other allergic rhinitis: Secondary | ICD-10-CM | POA: Diagnosis not present

## 2015-11-25 DIAGNOSIS — J301 Allergic rhinitis due to pollen: Secondary | ICD-10-CM | POA: Diagnosis not present

## 2015-11-29 DIAGNOSIS — J3089 Other allergic rhinitis: Secondary | ICD-10-CM | POA: Diagnosis not present

## 2015-11-29 DIAGNOSIS — J301 Allergic rhinitis due to pollen: Secondary | ICD-10-CM | POA: Diagnosis not present

## 2015-12-06 DIAGNOSIS — J3089 Other allergic rhinitis: Secondary | ICD-10-CM | POA: Diagnosis not present

## 2015-12-06 DIAGNOSIS — J301 Allergic rhinitis due to pollen: Secondary | ICD-10-CM | POA: Diagnosis not present

## 2015-12-07 DIAGNOSIS — J301 Allergic rhinitis due to pollen: Secondary | ICD-10-CM | POA: Diagnosis not present

## 2015-12-07 DIAGNOSIS — J3089 Other allergic rhinitis: Secondary | ICD-10-CM | POA: Diagnosis not present

## 2015-12-12 ENCOUNTER — Telehealth: Payer: Self-pay | Admitting: Endocrinology

## 2015-12-12 ENCOUNTER — Ambulatory Visit
Admission: RE | Admit: 2015-12-12 | Discharge: 2015-12-12 | Disposition: A | Discharge status: Home / Self Care | Payer: Medicare Other | Source: Ambulatory Visit | Attending: Endocrinology | Admitting: Endocrinology

## 2015-12-12 ENCOUNTER — Encounter: Payer: Self-pay | Admitting: Endocrinology

## 2015-12-12 ENCOUNTER — Other Ambulatory Visit: Payer: Self-pay

## 2015-12-12 ENCOUNTER — Ambulatory Visit (INDEPENDENT_AMBULATORY_CARE_PROVIDER_SITE_OTHER): Payer: Medicare Other | Admitting: Endocrinology

## 2015-12-12 ENCOUNTER — Ambulatory Visit: Payer: Medicare Other | Admitting: Endocrinology

## 2015-12-12 VITALS — BP 146/84 | HR 66 | Temp 98.0°F | Ht 75.0 in | Wt 229.0 lb

## 2015-12-12 DIAGNOSIS — S9031XA Contusion of right foot, initial encounter: Secondary | ICD-10-CM | POA: Diagnosis not present

## 2015-12-12 DIAGNOSIS — R55 Syncope and collapse: Secondary | ICD-10-CM | POA: Diagnosis not present

## 2015-12-12 DIAGNOSIS — M5030 Other cervical disc degeneration, unspecified cervical region: Secondary | ICD-10-CM

## 2015-12-12 DIAGNOSIS — S92421A Displaced fracture of distal phalanx of right great toe, initial encounter for closed fracture: Secondary | ICD-10-CM | POA: Diagnosis not present

## 2015-12-12 MED ORDER — CEFUROXIME AXETIL 250 MG PO TABS: 250.0000 mg | ORAL_TABLET | Freq: Two times a day (BID) | ORAL | Status: AC

## 2015-12-12 MED ORDER — CETIRIZINE-PSEUDOEPHEDRINE ER 5-120 MG PO TB12: 1.0000 | ORAL_TABLET | Freq: Two times a day (BID) | ORAL | Status: DC

## 2015-12-12 NOTE — Telephone Encounter (Signed)
See note below and please advise if we can send rx. This med is not currently listed on the pt's med list.  Thanks!

## 2015-12-12 NOTE — Telephone Encounter (Signed)
Pt notified rx has been sent

## 2015-12-12 NOTE — Progress Notes (Signed)
Subjective:    Patient ID: Joseph Rocha, male    DOB: Nov 08, 1934, 81 y.o.   MRN: [id number]  HPI 3 days ago, pt had slight pain at the posterior neck, and assoc syncope.  This happened in the shower.  He fell.  his wife heard him fall.  These sxs have resolved.  He did not seek medical care that day.   He also has bilat otalgia and nasal congestion.   Past Medical History  Diagnosis Date  . Diabetes mellitus   . Hyperlipidemia   . Anemia   . Hypertension   . Allergy   . Pituitary abnormality ([place]) 2004  . OA (osteoarthritis)   . Elevated PSA   . Hypogonadism male     Past Surgical History  Procedure Laterality Date  . Lumbar disc surgery    . Knee arthroscopy Left   . Transphenoidal / transnasal hypophysectomy / resection pituitary tumor  2005  . Electrocardiogram  03/25/2006  . Doppler echocardiography  12/04/2001  . Meniscus repair Right may 2015  . Tonsillectomy      Social History   Social History  . Marital Status: Married    Spouse Name: N/A  . Number of Children: 2  . Years of Education: N/A   Occupational History  . retired    Social History Main Topics  . Smoking status: Former Smoker    Quit date: 02/22/1957  . Smokeless tobacco: Never Used  . Alcohol Use: No  . Drug Use: No  . Sexual Activity: Not Currently   Other Topics Concern  . Not on file   Social History Narrative   Lives with wife.  They have two grown children.   He is retired from the [employer].    Current Outpatient Prescriptions on File Prior to Visit  Medication Sig Dispense Refill  . aspirin 81 MG tablet Take 81 mg by mouth daily.    . calcium carbonate (OS-CAL) 600 MG TABS Take 600 mg by mouth daily.    . Cholecalciferol (VITAMIN D3 PO) Take 1 capsule by mouth daily. 662mcg    . finasteride (PROSCAR) 5 MG tablet Take 5 mg by mouth daily.      . fish oil-omega-3 fatty acids 1000 MG capsule Take 1 g by mouth daily.    . fludrocortisone (FLORINEF) 0.1 MG tablet Take 1  tablet (0.1 mg total) by mouth 2 (two) times daily. 60 tablet 11  . folic acid (FOLVITE) 1 MG tablet Take 1 tablet (1 mg total) by mouth daily. 90 tablet 3  . KLOR-CON M20 20 MEQ tablet Take 20 mEq by mouth daily.     . Magnesium Oxide 400 (240 MG) MG TABS Take 1 tablet by mouth 2 (two) times daily. 180 tablet 3  . meloxicam (MOBIC) 15 MG tablet Take 1 tablet (15 mg total) by mouth daily. 30 tablet 8  . methocarbamol (ROBAXIN) 500 MG tablet Take 500 mg by mouth at bedtime as needed for muscle spasms.    . Multiple Vitamins-Minerals (CENTRUM SILVER PO) Take 1 tablet by mouth daily.      Marland Kitchen omeprazole (PRILOSEC) 40 MG capsule Take 1 capsule (40 mg total) by mouth daily. 30 capsule 11  . ondansetron (ZOFRAN) 4 MG tablet Take 1 tablet (4 mg total) by mouth every 8 (eight) hours as needed for nausea or vomiting. 36 tablet 1  . pioglitazone (ACTOS) 45 MG tablet Take 1 tablet (45 mg total) by mouth daily. 90 tablet 2  .  polyethylene glycol powder (GLYCOLAX/MIRALAX) powder Mix 17 grams in 8 oz of water daily 255 g 5  . pravastatin (PRAVACHOL) 80 MG tablet Take 1 tablet (80 mg total) by mouth daily. 90 tablet 3  . Tamsulosin HCl (FLOMAX) 0.4 MG CAPS Take 0.4 mg by mouth daily.      . Testosterone (AXIRON) 30 MG/ACT SOLN Place onto the skin.     No current facility-administered medications on file prior to visit.    Allergies  Allergen Reactions  . Lipitor [Atorvastatin]     myalgias    Family History  Problem Relation Age of Onset  . Cancer Brother     uncertain type  . Diabetes Mellitus II Mother     Deceased, 66s  . Heart attack Father   . Healthy Daughter     BP 146/84 mmHg  Pulse 66  Temp(Src) 98 F (36.7 C) (Oral)  Ht 6\' 3"  (1.905 m)  Wt 229 lb (103.874 kg)  BMI 28.62 kg/m2  SpO2 96%  Review of Systems Since the injury, he has pain at the right big toe.  No chest pain.  He says chronic pain of the neck is worse.      Objective:   Physical Exam VITAL SIGNS:  See vs  page GENERAL: no distress Neck: supple LUNGS:  Clear to auscultation HEART:  Regular rate and rhythm without murmurs noted. Normal S1,S2.   Gait: normal, except he favors RLE Right great toe: moderate swelling/ecchymosis/tend.   i personally reviewed electrocardiogram tracing (today): Indication: syncope Impression: SB    Assessment & Plan:  Syncope, recurrent.  OA of the neck, worse.  Toe contusion, new.  URI: new.   Patient is advised the following: Patient Instructions  Please go back to see Drs Vertell Limber and Tamala Julian.  you will receive a phone call, about a day and time for an appointment. i have sent a prescription to your pharmacy, for an antibiotic pill Loratadine-d (non-prescription) will help your congestion.

## 2015-12-12 NOTE — Patient Instructions (Addendum)
Please go back to see Drs Vertell Limber and Tamala Julian.  you will receive a phone call, about a day and time for an appointment. i have sent a prescription to your pharmacy, for an antibiotic pill Loratadine-d (non-prescription) will help your congestion.

## 2015-12-12 NOTE — Telephone Encounter (Signed)
Send medication to Loratadine-d  [id number] [address], [place] [address] [phone] (Phone) [phone] (Fax)       Please advise when ready

## 2015-12-12 NOTE — Telephone Encounter (Signed)
Pt did not want to discuss with me he is requesting a call from [facility]

## 2015-12-12 NOTE — Telephone Encounter (Signed)
i have sent a prescription to your pharmacy 

## 2015-12-12 NOTE — Telephone Encounter (Signed)
i printed 

## 2015-12-12 NOTE — Telephone Encounter (Signed)
Pt wanted to know if we could send a rx for a walker with a seat on it due to the issues he has been having passing out and his toe being broke.

## 2015-12-13 ENCOUNTER — Telehealth: Payer: Self-pay | Admitting: Endocrinology

## 2015-12-13 NOTE — Telephone Encounter (Signed)
I contacted the pt and advised we have sent his rx for his walker to [facility]. Pt was advised to contact the supplier and verify if any information is needed from him so he can receive his walker. Pt voiced understanding.

## 2015-12-13 NOTE — Telephone Encounter (Signed)
Patient called stating that he would like to speak with [facility] regarding some information that he got    Thank you   Call back: [phone]

## 2015-12-13 NOTE — Telephone Encounter (Signed)
I contacted the pt. He requested his rx for his walk[facility]. Rx faxed per pt's request.

## 2015-12-26 DIAGNOSIS — J3089 Other allergic rhinitis: Secondary | ICD-10-CM | POA: Diagnosis not present

## 2015-12-26 DIAGNOSIS — J301 Allergic rhinitis due to pollen: Secondary | ICD-10-CM | POA: Diagnosis not present

## 2016-01-06 DIAGNOSIS — M79674 Pain in right toe(s): Secondary | ICD-10-CM | POA: Diagnosis not present

## 2016-01-10 DIAGNOSIS — J3089 Other allergic rhinitis: Secondary | ICD-10-CM | POA: Diagnosis not present

## 2016-01-10 DIAGNOSIS — J301 Allergic rhinitis due to pollen: Secondary | ICD-10-CM | POA: Diagnosis not present

## 2016-01-16 ENCOUNTER — Telehealth: Payer: Self-pay | Admitting: Gastroenterology

## 2016-01-16 NOTE — Telephone Encounter (Signed)
Patient reports several days of loose stools and excess gas.  He has had some vomiting but now feels better.  He would like to come in and see Dr. Fuller Plan .  He will come in on 02/15/16 11:30.  He will call back for additional concerns between now and the appt

## 2016-01-19 DIAGNOSIS — J301 Allergic rhinitis due to pollen: Secondary | ICD-10-CM | POA: Diagnosis not present

## 2016-01-19 DIAGNOSIS — J3089 Other allergic rhinitis: Secondary | ICD-10-CM | POA: Diagnosis not present

## 2016-01-23 DIAGNOSIS — G249 Dystonia, unspecified: Secondary | ICD-10-CM | POA: Diagnosis not present

## 2016-01-23 DIAGNOSIS — M542 Cervicalgia: Secondary | ICD-10-CM | POA: Diagnosis not present

## 2016-01-23 DIAGNOSIS — M5412 Radiculopathy, cervical region: Secondary | ICD-10-CM | POA: Diagnosis not present

## 2016-01-23 DIAGNOSIS — Z6829 Body mass index (BMI) 29.0-29.9, adult: Secondary | ICD-10-CM | POA: Diagnosis not present

## 2016-01-25 DIAGNOSIS — M542 Cervicalgia: Secondary | ICD-10-CM | POA: Diagnosis not present

## 2016-01-25 DIAGNOSIS — M50321 Other cervical disc degeneration at C4-C5 level: Secondary | ICD-10-CM | POA: Diagnosis not present

## 2016-01-25 DIAGNOSIS — J301 Allergic rhinitis due to pollen: Secondary | ICD-10-CM | POA: Diagnosis not present

## 2016-01-25 DIAGNOSIS — R293 Abnormal posture: Secondary | ICD-10-CM | POA: Diagnosis not present

## 2016-01-25 DIAGNOSIS — M6281 Muscle weakness (generalized): Secondary | ICD-10-CM | POA: Diagnosis not present

## 2016-01-25 DIAGNOSIS — J3089 Other allergic rhinitis: Secondary | ICD-10-CM | POA: Diagnosis not present

## 2016-01-30 DIAGNOSIS — M50321 Other cervical disc degeneration at C4-C5 level: Secondary | ICD-10-CM | POA: Diagnosis not present

## 2016-01-30 DIAGNOSIS — M542 Cervicalgia: Secondary | ICD-10-CM | POA: Diagnosis not present

## 2016-01-30 DIAGNOSIS — M6281 Muscle weakness (generalized): Secondary | ICD-10-CM | POA: Diagnosis not present

## 2016-01-30 DIAGNOSIS — R293 Abnormal posture: Secondary | ICD-10-CM | POA: Diagnosis not present

## 2016-02-01 DIAGNOSIS — J3089 Other allergic rhinitis: Secondary | ICD-10-CM | POA: Diagnosis not present

## 2016-02-01 DIAGNOSIS — M50321 Other cervical disc degeneration at C4-C5 level: Secondary | ICD-10-CM | POA: Diagnosis not present

## 2016-02-01 DIAGNOSIS — R293 Abnormal posture: Secondary | ICD-10-CM | POA: Diagnosis not present

## 2016-02-01 DIAGNOSIS — J301 Allergic rhinitis due to pollen: Secondary | ICD-10-CM | POA: Diagnosis not present

## 2016-02-01 DIAGNOSIS — M6281 Muscle weakness (generalized): Secondary | ICD-10-CM | POA: Diagnosis not present

## 2016-02-01 DIAGNOSIS — M542 Cervicalgia: Secondary | ICD-10-CM | POA: Diagnosis not present

## 2016-02-03 DIAGNOSIS — S92421D Displaced fracture of distal phalanx of right great toe, subsequent encounter for fracture with routine healing: Secondary | ICD-10-CM | POA: Diagnosis not present

## 2016-02-06 DIAGNOSIS — M50321 Other cervical disc degeneration at C4-C5 level: Secondary | ICD-10-CM | POA: Diagnosis not present

## 2016-02-06 DIAGNOSIS — M542 Cervicalgia: Secondary | ICD-10-CM | POA: Diagnosis not present

## 2016-02-06 DIAGNOSIS — R293 Abnormal posture: Secondary | ICD-10-CM | POA: Diagnosis not present

## 2016-02-06 DIAGNOSIS — M6281 Muscle weakness (generalized): Secondary | ICD-10-CM | POA: Diagnosis not present

## 2016-02-08 DIAGNOSIS — M542 Cervicalgia: Secondary | ICD-10-CM | POA: Diagnosis not present

## 2016-02-08 DIAGNOSIS — M6281 Muscle weakness (generalized): Secondary | ICD-10-CM | POA: Diagnosis not present

## 2016-02-08 DIAGNOSIS — M50321 Other cervical disc degeneration at C4-C5 level: Secondary | ICD-10-CM | POA: Diagnosis not present

## 2016-02-08 DIAGNOSIS — J301 Allergic rhinitis due to pollen: Secondary | ICD-10-CM | POA: Diagnosis not present

## 2016-02-08 DIAGNOSIS — J3089 Other allergic rhinitis: Secondary | ICD-10-CM | POA: Diagnosis not present

## 2016-02-08 DIAGNOSIS — R293 Abnormal posture: Secondary | ICD-10-CM | POA: Diagnosis not present

## 2016-02-10 ENCOUNTER — Ambulatory Visit (INDEPENDENT_AMBULATORY_CARE_PROVIDER_SITE_OTHER): Payer: Medicare Other | Admitting: Interventional Cardiology

## 2016-02-10 ENCOUNTER — Encounter: Payer: Self-pay | Admitting: Interventional Cardiology

## 2016-02-10 VITALS — BP 92/56 | HR 64 | Ht 75.0 in | Wt 226.8 lb

## 2016-02-10 DIAGNOSIS — I495 Sick sinus syndrome: Secondary | ICD-10-CM

## 2016-02-10 DIAGNOSIS — R55 Syncope and collapse: Secondary | ICD-10-CM | POA: Diagnosis not present

## 2016-02-10 NOTE — Patient Instructions (Addendum)
Medication Instructions:  Your physician recommends that you continue on your current medications as directed. Please refer to the Current Medication list given to you today.   Labwork: None   Testing/Procedures: Your physician has requested that you have an echocardiogram. Echocardiography is a painless test that uses sound waves to create images of your heart. It provides your doctor with information about the size and shape of your heart and how well your heart's chambers and valves are working. This procedure takes approximately one hour. There are no restrictions for this procedure.  This is scheduled for Wednesday April 26,2017 at 11:30AM  Follow-Up: You have been referred to EP for loop recorder and evaluation for etiology of syncope.  You have been scheduled to see Dr Jolyn Nap Thursday April 27,2017 at 2:45PM.  A follow-up appointment with Dr Tamala Julian will be determined after you see Dr Caryl Comes.       Any Other Special Instructions Will Be Listed Below (If Applicable).     If you need a refill on your cardiac medications before your next appointment, please call your pharmacy.

## 2016-02-10 NOTE — Progress Notes (Signed)
Cardiology Office Note   Date:  02/10/2016   ID:  ALFONZA BRIESE, DOB [birth year], MRN [id number]  PCP:  Renato Shin, MD  Cardiologist:  Sinclair Grooms, MD   Chief Complaint  Patient presents with  . Loss of Consciousness      History of Present Illness: Joseph Rocha is a 81 y.o. male who presents for Evaluation of recurrent syncope.  I have known Joseph Rocha for greater than 30 years. He has diabetes mellitus, prior history of hypertension, hyperlipidemia, and history of anemia. He has had greater than 5 syncopal episodes over that time frame. Evaluation in the remote past including a tilt table test has been performed. The working diagnosis has been that of Neurocardiogenic syncope. He usually has abdominal discomfort, nausea, and diaphoresis prior to fainting. The most recent episode occurred while taking a shower. He started having severe neck pain. He has chronic degenerative disc disease. The pain was severe. This went on for several seconds then he eventually fainted while trying to get out of the shower. He states his wife informed him he was out for several minutes, though less than 5. EMS was called. When he awakened, he feels that he was mentally/cognitively sharp. There was no mention of bowel or bladder incontinence. There was no tonic-clonic activity. This most recent episode occurred in February.  We have done heart monitors in the past without evidence of action of bradycardia or tachycardia. Her heart is been structurally normal when evaluated by echo in the past. No recent assessment of LV function however.    Past Medical History  Diagnosis Date  . Diabetes mellitus   . Hyperlipidemia   . Anemia   . Hypertension   . Allergy   . Pituitary abnormality ([place]) 2004  . OA (osteoarthritis)   . Elevated PSA   . Hypogonadism male     Past Surgical History  Procedure Laterality Date  . Lumbar disc surgery    . Knee arthroscopy Left   . Transphenoidal /  transnasal hypophysectomy / resection pituitary tumor  2005  . Electrocardiogram  03/25/2006  . Doppler echocardiography  12/04/2001  . Meniscus repair Right may 2015  . Tonsillectomy       Current Outpatient Prescriptions  Medication Sig Dispense Refill  . aspirin 81 MG tablet Take 81 mg by mouth daily.    . calcium carbonate (OS-CAL) 600 MG TABS Take 600 mg by mouth daily.    . cetirizine-pseudoephedrine (ZYRTEC-D) 5-120 MG tablet Take 1 tablet by mouth daily.    . Cholecalciferol (VITAMIN D3 PO) Take 1 capsule by mouth daily. 654mcg    . finasteride (PROSCAR) 5 MG tablet Take 5 mg by mouth daily.      . fish oil-omega-3 fatty acids 1000 MG capsule Take 1 g by mouth daily.    . fludrocortisone (FLORINEF) 0.1 MG tablet Take 1 tablet (0.1 mg total) by mouth 2 (two) times daily. 60 tablet 11  . folic acid (FOLVITE) 1 MG tablet Take 1 tablet (1 mg total) by mouth daily. 90 tablet 3  . KLOR-CON M20 20 MEQ tablet Take 20 mEq by mouth daily.     . Magnesium Oxide 400 (240 MG) MG TABS Take 1 tablet by mouth 2 (two) times daily. 180 tablet 3  . meloxicam (MOBIC) 15 MG tablet Take 1 tablet (15 mg total) by mouth daily. 30 tablet 8  . methocarbamol (ROBAXIN) 500 MG tablet Take 500 mg by mouth at bedtime as needed  for muscle spasms.    . Multiple Vitamins-Minerals (CENTRUM SILVER PO) Take 1 tablet by mouth daily.      Marland Kitchen omeprazole (PRILOSEC) 40 MG capsule Take 1 capsule (40 mg total) by mouth daily. 30 capsule 11  . ondansetron (ZOFRAN) 4 MG tablet Take 1 tablet (4 mg total) by mouth every 8 (eight) hours as needed for nausea or vomiting. 36 tablet 1  . pioglitazone (ACTOS) 45 MG tablet Take 1 tablet (45 mg total) by mouth daily. 90 tablet 2  . polyethylene glycol powder (GLYCOLAX/MIRALAX) powder Mix 17 grams in 8 oz of water daily 255 g 5  . pravastatin (PRAVACHOL) 80 MG tablet Take 1 tablet (80 mg total) by mouth daily. 90 tablet 3  . Tamsulosin HCl (FLOMAX) 0.4 MG CAPS Take 0.4 mg by mouth daily.       . Testosterone (AXIRON) 30 MG/ACT SOLN Place onto the skin.     No current facility-administered medications for this visit.    Allergies:   Lipitor    Social History:  The patient  reports that he quit smoking about 59 years ago. He has never used smokeless tobacco. He reports that he does not drink alcohol or use illicit drugs.   Family History:  The patient's family history includes Cancer in his brother; Diabetes Mellitus II in his mother; Healthy in his daughter; Heart attack in his father.    ROS:  Please see the history of present illness.   Otherwise, review of systems are positive for Chronic neck pain, difficulty with ambulation.   All other systems are reviewed and negative.    PHYSICAL EXAM: VS:  BP 92/56 mmHg  Pulse 64  Ht 6\' 3"  (1.905 m)  Wt 226 lb 12.8 oz (102.876 kg)  BMI 28.35 kg/m2 , BMI Body mass index is 28.35 kg/(m^2). GEN: Well nourished, well developed, in no acute distress HEENT: normal Neck: no JVD, carotid bruits, or masses Cardiac: RRR.  There is no murmur, rub, or gallop. There is no edema. Respiratory:  clear to auscultation bilaterally, normal work of breathing. GI: soft, nontender, nondistended, + BS MS: no deformity or atrophy Skin: warm and dry, no rash Neuro:  Strength and sensation are intact Psych: euthymic mood, full affect   EKG:  EKG is not ordered today. The ekg reveals that on the most recent tracing from 12/12/15, there was sinus bradycardia, normal PR interval, and nonspecific ST abnormality.   Recent Labs: 07/27/2015: ALT 15; BUN 28*; Creatinine, Ser 1.35; Hemoglobin 11.8*; Platelets 192.0; Potassium 4.0; Sodium 137; TSH 4.93*    Lipid Panel    Component Value Date/Time   CHOL 309* 07/27/2015 1712   TRIG 130.0 07/27/2015 1712   HDL 40.00 07/27/2015 1712   CHOLHDL 8 07/27/2015 1712   VLDL 26.0 07/27/2015 1712   LDLCALC 243* 07/27/2015 1712      Wt Readings from Last 3 Encounters:  02/10/16 226 lb 12.8 oz (102.876 kg)    12/12/15 229 lb (103.874 kg)  10/19/15 229 lb 12.8 oz (104.237 kg)      Other studies Reviewed: Additional studies/ records that were reviewed today include: Reviewed chart data and historical data.. The findings include has had evaluation including a tilt table test. Remote LV function assessment in 2004 was normal..    ASSESSMENT AND PLAN:  1. Sick sinus syndrome (HCC) Sinus bradycardia but never documented severe bradycardia. Never able to document correlation between bradycardia and syncope. Sinus bradycardia noted on recent EKG.  2. SYNCOPE Recurring episodes  of syncope over the past 30 years totaling at least 5. 2 of which were associated with injury. Clinical diagnosis has been that of Neurocardiogenic syncope. Prodrome is usually GI.  3. Type 2 diabetes mellitus with complications  4. Prior history of hypertension. No evidence of orthostasis on exam today with sitting blood pressure 140/80 standing 138/75 mmHg.     Current medicines are reviewed at length with the patient today.  The patient has the following concerns regarding medicines: None.  The following changes/actions have been instituted:    2-D Doppler echocardiogram   Electrophysiology consultation  Loop recorder  No change in medical regimen. Continue Florinef.   Labs/ tests ordered today include:  No orders of the defined types were placed in this encounter.     Disposition:   FU with HS in PRN    Signed, Sinclair Grooms, MD  02/10/2016 10:32 AM    [address], [place], [place]  [address] Phone: [phone]; Fax: [phone]

## 2016-02-13 DIAGNOSIS — R293 Abnormal posture: Secondary | ICD-10-CM | POA: Diagnosis not present

## 2016-02-13 DIAGNOSIS — M542 Cervicalgia: Secondary | ICD-10-CM | POA: Diagnosis not present

## 2016-02-13 DIAGNOSIS — M6281 Muscle weakness (generalized): Secondary | ICD-10-CM | POA: Diagnosis not present

## 2016-02-13 DIAGNOSIS — M50321 Other cervical disc degeneration at C4-C5 level: Secondary | ICD-10-CM | POA: Diagnosis not present

## 2016-02-15 ENCOUNTER — Ambulatory Visit: Payer: Medicare Other | Admitting: Gastroenterology

## 2016-02-15 DIAGNOSIS — M6281 Muscle weakness (generalized): Secondary | ICD-10-CM | POA: Diagnosis not present

## 2016-02-15 DIAGNOSIS — M542 Cervicalgia: Secondary | ICD-10-CM | POA: Diagnosis not present

## 2016-02-15 DIAGNOSIS — R293 Abnormal posture: Secondary | ICD-10-CM | POA: Diagnosis not present

## 2016-02-15 DIAGNOSIS — J301 Allergic rhinitis due to pollen: Secondary | ICD-10-CM | POA: Diagnosis not present

## 2016-02-15 DIAGNOSIS — M50321 Other cervical disc degeneration at C4-C5 level: Secondary | ICD-10-CM | POA: Diagnosis not present

## 2016-02-15 DIAGNOSIS — J3089 Other allergic rhinitis: Secondary | ICD-10-CM | POA: Diagnosis not present

## 2016-02-16 ENCOUNTER — Institutional Professional Consult (permissible substitution): Payer: Medicare Other | Admitting: Internal Medicine

## 2016-02-20 DIAGNOSIS — R293 Abnormal posture: Secondary | ICD-10-CM | POA: Diagnosis not present

## 2016-02-20 DIAGNOSIS — M542 Cervicalgia: Secondary | ICD-10-CM | POA: Diagnosis not present

## 2016-02-20 DIAGNOSIS — M6281 Muscle weakness (generalized): Secondary | ICD-10-CM | POA: Diagnosis not present

## 2016-02-20 DIAGNOSIS — M50321 Other cervical disc degeneration at C4-C5 level: Secondary | ICD-10-CM | POA: Diagnosis not present

## 2016-02-22 DIAGNOSIS — J301 Allergic rhinitis due to pollen: Secondary | ICD-10-CM | POA: Diagnosis not present

## 2016-02-22 DIAGNOSIS — M6281 Muscle weakness (generalized): Secondary | ICD-10-CM | POA: Diagnosis not present

## 2016-02-22 DIAGNOSIS — J3089 Other allergic rhinitis: Secondary | ICD-10-CM | POA: Diagnosis not present

## 2016-02-22 DIAGNOSIS — M50321 Other cervical disc degeneration at C4-C5 level: Secondary | ICD-10-CM | POA: Diagnosis not present

## 2016-02-22 DIAGNOSIS — M542 Cervicalgia: Secondary | ICD-10-CM | POA: Diagnosis not present

## 2016-02-22 DIAGNOSIS — R293 Abnormal posture: Secondary | ICD-10-CM | POA: Diagnosis not present

## 2016-02-27 DIAGNOSIS — M542 Cervicalgia: Secondary | ICD-10-CM | POA: Diagnosis not present

## 2016-02-27 DIAGNOSIS — R293 Abnormal posture: Secondary | ICD-10-CM | POA: Diagnosis not present

## 2016-02-27 DIAGNOSIS — M6281 Muscle weakness (generalized): Secondary | ICD-10-CM | POA: Diagnosis not present

## 2016-02-27 DIAGNOSIS — M50321 Other cervical disc degeneration at C4-C5 level: Secondary | ICD-10-CM | POA: Diagnosis not present

## 2016-02-29 ENCOUNTER — Other Ambulatory Visit: Payer: Self-pay

## 2016-02-29 ENCOUNTER — Ambulatory Visit (HOSPITAL_COMMUNITY): Payer: Medicare Other | Attending: Cardiovascular Disease

## 2016-02-29 DIAGNOSIS — I495 Sick sinus syndrome: Secondary | ICD-10-CM | POA: Insufficient documentation

## 2016-02-29 DIAGNOSIS — R55 Syncope and collapse: Secondary | ICD-10-CM | POA: Diagnosis not present

## 2016-02-29 DIAGNOSIS — E119 Type 2 diabetes mellitus without complications: Secondary | ICD-10-CM | POA: Diagnosis not present

## 2016-02-29 DIAGNOSIS — M50321 Other cervical disc degeneration at C4-C5 level: Secondary | ICD-10-CM | POA: Diagnosis not present

## 2016-02-29 DIAGNOSIS — I119 Hypertensive heart disease without heart failure: Secondary | ICD-10-CM | POA: Insufficient documentation

## 2016-02-29 DIAGNOSIS — J301 Allergic rhinitis due to pollen: Secondary | ICD-10-CM | POA: Diagnosis not present

## 2016-02-29 DIAGNOSIS — J3089 Other allergic rhinitis: Secondary | ICD-10-CM | POA: Diagnosis not present

## 2016-02-29 DIAGNOSIS — R293 Abnormal posture: Secondary | ICD-10-CM | POA: Diagnosis not present

## 2016-02-29 DIAGNOSIS — M542 Cervicalgia: Secondary | ICD-10-CM | POA: Diagnosis not present

## 2016-02-29 DIAGNOSIS — M6281 Muscle weakness (generalized): Secondary | ICD-10-CM | POA: Diagnosis not present

## 2016-03-01 ENCOUNTER — Encounter: Payer: Self-pay | Admitting: Internal Medicine

## 2016-03-01 ENCOUNTER — Encounter: Payer: Self-pay | Admitting: *Deleted

## 2016-03-01 ENCOUNTER — Ambulatory Visit (INDEPENDENT_AMBULATORY_CARE_PROVIDER_SITE_OTHER): Payer: Medicare Other | Admitting: Internal Medicine

## 2016-03-01 VITALS — BP 152/78 | HR 55 | Ht 75.0 in | Wt 231.8 lb

## 2016-03-01 DIAGNOSIS — R55 Syncope and collapse: Secondary | ICD-10-CM | POA: Diagnosis not present

## 2016-03-01 DIAGNOSIS — R0602 Shortness of breath: Secondary | ICD-10-CM

## 2016-03-01 DIAGNOSIS — M542 Cervicalgia: Secondary | ICD-10-CM | POA: Diagnosis not present

## 2016-03-01 DIAGNOSIS — I495 Sick sinus syndrome: Secondary | ICD-10-CM | POA: Diagnosis not present

## 2016-03-01 NOTE — Progress Notes (Signed)
ELECTROPHYSIOLOGY CONSULT NOTE  Patient ID: Joseph Rocha, MRN: [id number], DOB/AGE: [birth year] 81 y.o. Admit date: (Not on file) Date of Consult: 03/01/2016  Primary Physician: Renato Shin, MD Primary Cardiologist: [facility] Consulting Physician [facility]  Chief Complaint: syncope   HPI Joseph Rocha is a 81 y.o. male  Referred for evaluation syncope. The patient is a notably poor historian. When asked about the duration of his spells, he says 3-5 years. He then recounts an event that occurred when his daughter was going off to college 15 years ago.  He describes these spells relatively stereotypically associated with a GI rumbling associated with a "numbness" that proceeds up through his chest accompanied by some nausea. By the time he gets to his neck over some seconds he then loses consciousness. He has fallen on board a ship. Fallen in the bathroom he has fallen at home. It is his wife's impression that these episodes leave him unconscious for 1-2 minutes. He describes a paucity of symptoms afterwards; however, on the episode 15 years ago he remembers that EMS to him up and his blood pressure which had been normal while recumbent fell precipitously upon standing  He has minimal orthostatic lightheadedness.  Cardiac evaluation in the past has included a remote Myoview which was normal; an echocardiogram obtained today demonstrated normal LV function. He describes tilt table testing etc., I was unable to find these records in the computer  He takes multiple medications. He is on to alpha blockers for his prostate. He has diabetes. He describes hypotension.  He is on Florinef as best as I can tell from the endocrinology notes not for the spells but for "dizziness" which on one occasion was "75% or better "following doubling of the dose     Past Medical History  Diagnosis Date  . Diabetes mellitus   . Hyperlipidemia   . Anemia   . Hypertension   . Allergy   . Pituitary  abnormality ([place]) 2004  . OA (osteoarthritis)   . Elevated PSA   . Hypogonadism male       Surgical History:  Past Surgical History  Procedure Laterality Date  . Lumbar disc surgery    . Knee arthroscopy Left   . Transphenoidal / transnasal hypophysectomy / resection pituitary tumor  2005  . Electrocardiogram  03/25/2006  . Doppler echocardiography  12/04/2001  . Meniscus repair Right may 2015  . Tonsillectomy       Home Meds: Prior to Admission medications   Medication Sig Start Date End Date Taking? Authorizing Provider  aspirin 81 MG tablet Take 81 mg by mouth daily.   Yes Historical Provider, MD  calcium carbonate (OS-CAL) 600 MG TABS Take 600 mg by mouth daily.   Yes Historical Provider, MD  cetirizine-pseudoephedrine (ZYRTEC-D) 5-120 MG tablet Take 1 tablet by mouth daily.   Yes Historical Provider, MD  Cholecalciferol (VITAMIN D3 PO) Take 1 capsule by mouth daily. 653mcg   Yes Historical Provider, MD  finasteride (PROSCAR) 5 MG tablet Take 5 mg by mouth daily.     Yes Historical Provider, MD  fish oil-omega-3 fatty acids 1000 MG capsule Take 1 g by mouth daily.   Yes Historical Provider, MD  fludrocortisone (FLORINEF) 0.1 MG tablet Take 1 tablet (0.1 mg total) by mouth 2 (two) times daily. 11/10/15  Yes Renato Shin, MD  folic acid (FOLVITE) 1 MG tablet Take 1 tablet (1 mg total) by mouth daily. 11/10/15  Yes Renato Shin, MD  KLOR-CON M20  20 MEQ tablet Take 20 mEq by mouth daily.  03/15/13  Yes Historical Provider, MD  Magnesium Oxide 400 (240 MG) MG TABS Take 1 tablet by mouth 2 (two) times daily. 05/14/14  Yes Donika K Patel, DO  meloxicam (MOBIC) 15 MG tablet Take 1 tablet (15 mg total) by mouth daily. 07/27/15  Yes Renato Shin, MD  methocarbamol (ROBAXIN) 500 MG tablet Take 500 mg by mouth at bedtime as needed for muscle spasms.   Yes Historical Provider, MD  Multiple Vitamins-Minerals (CENTRUM SILVER PO) Take 1 tablet by mouth daily.     Yes Historical Provider, MD  omeprazole  (PRILOSEC) 40 MG capsule Take 1 capsule (40 mg total) by mouth daily. 10/19/15  Yes Ladene Artist, MD  ondansetron (ZOFRAN) 4 MG tablet Take 1 tablet (4 mg total) by mouth every 8 (eight) hours as needed for nausea or vomiting. 11/24/14  Yes Renato Shin, MD  pioglitazone (ACTOS) 45 MG tablet Take 1 tablet (45 mg total) by mouth daily. 09/06/15  Yes Renato Shin, MD  polyethylene glycol powder (GLYCOLAX/MIRALAX) powder Mix 17 grams in 8 oz of water daily 10/19/15  Yes Ladene Artist, MD  pravastatin (PRAVACHOL) 80 MG tablet Take 1 tablet (80 mg total) by mouth daily. 09/06/15  Yes Renato Shin, MD  Tamsulosin HCl (FLOMAX) 0.4 MG CAPS Take 0.4 mg by mouth daily.     Yes Historical Provider, MD  Testosterone (AXIRON) 30 MG/ACT SOLN Place onto the skin.   Yes Historical Provider, MD    Allergies:  Allergies  Allergen Reactions  . Lipitor [Atorvastatin]     myalgias    Social History   Social History  . Marital Status: Married    Spouse Name: N/A  . Number of Children: 2  . Years of Education: N/A   Occupational History  . retired    Social History Main Topics  . Smoking status: Former Smoker    Quit date: 02/22/1957  . Smokeless tobacco: Never Used  . Alcohol Use: No  . Drug Use: No  . Sexual Activity: Not Currently   Other Topics Concern  . Not on file   Social History Narrative   Lives with wife.  They have two grown children.   He is retired from the [employer].     Family History  Problem Relation Age of Onset  . Cancer Brother     uncertain type  . Diabetes Mellitus II Mother     Deceased, 87s  . Heart attack Father   . Healthy Daughter      ROS:  Please see the history of present illness.     All other systems reviewed and negative.    Physical Exam:   Blood pressure 152/78, pulse 55, height 6\' 3"  (1.905 m), weight 231 lb 12.8 oz (105.144 kg). General: Well developed, well nourished male in no acute distress. Head: Normocephalic, atraumatic, sclera  non-icteric, no xanthomas, nares are without discharge. EENT: normal  Lymph Nodes:  none Neck: Negative for carotid bruits. JVD not elevated. Back:without scoliosis kyphosis Lungs: Clear bilaterally to auscultation without wheezes, rales, or rhonchi. Breathing is unlabored. Heart: RRR with S1 S2. 2/6 murmur . No rubs, or gallops appreciated. Abdomen: Soft, non-tender, non-distended with normoactive bowel sounds. No hepatomegaly. No rebound/guarding. No obvious abdominal masses. Msk:  Strength and tone appear normal for age. Extremities: No clubbing or cyanosis. No   edema.  Distal pedal pulses are 2+ and equal bilaterally. Skin: Warm and Dry Neuro: Alert and  oriented X 3. CN III-XII intact Grossly normal sensory and motor function . wlaks with a cane Psych:  Responds to questions appropriately with a normal affect.      Labs: Cardiac Enzymes No results for input(s): CKTOTAL, CKMB, TROPONINI in the last 72 hours. CBC Lab Results  Component Value Date   WBC 6.1 07/27/2015   HGB 11.8* 07/27/2015   HCT 35.5* 07/27/2015   MCV 91.7 07/27/2015   PLT 192.0 07/27/2015   PROTIME: No results for input(s): LABPROT, INR in the last 72 hours. Chemistry No results for input(s): NA, K, CL, CO2, BUN, CREATININE, CALCIUM, PROT, BILITOT, ALKPHOS, ALT, AST, GLUCOSE in the last 168 hours.  Invalid input(s): LABALBU Lipids Lab Results  Component Value Date   CHOL 309* 07/27/2015   HDL 40.00 07/27/2015   LDLCALC 243* 07/27/2015   TRIG 130.0 07/27/2015   BNP PRO B NATRIURETIC PEPTIDE (BNP)  Date/Time Value Ref Range Status  10/17/2009 12:00 AM 28.0 0.0-100.0 pg/mL Final   Thyroid Function Tests: No results for input(s): TSH, T4TOTAL, T3FREE, THYROIDAB in the last 72 hours.  Invalid input(s): FREET3 Miscellaneous No results found for: DDIMER  Radiology/Studies:  No results found.  EKG: Sinus rhythm at 54  intervals 20/ 10/45 Axis XXXII   Assessment and Plan: Syncope  Multiple  medications  Diabetes  Sinus bradycardia   The patient has a long-standing history of syncope. It is characterized by a recognizable prodrome which is of unclear duration. The episodes of syncope are of relatively long duration suggestive of a neurally mediated episode. The episode many years ago with orthostatic hypotension demonstrated by EMS supports that diagnosis although orthostasis is a fairly nonspecific finding in the context of the syncope evaluation  The fact that his LV function is normal and his ECG is normal makes it unlikely that this is a primary malignant arrhythmia.  I agree with Dr. Tamala Julian that a loop recorder is our best tool. It may be that the GI symptoms are a trigger or a epiphenomena. It is hard to know. Sometimes Bentyl and help with this.  I am also concerned about the neck discomfort that has occurred with his syncopal episodes and wonder as to whether that might be an anginal equivalent. He has a remote negative Myoview.  I doubt his bradycardia is contributing  At this point I think it is reasonable to maintain his Florinef given the notes from 2016 describing improvement in his dizziness       Joseph Rocha

## 2016-03-01 NOTE — Patient Instructions (Signed)
Medication Instructions: - Your physician recommends that you continue on your current medications as directed. Please refer to the Current Medication list given to you today.  Labwork: - none  Procedures/Testing: - Your physician has requested that you have a lexiscan myoview. For further information please visit [email or web address]. Please follow instruction sheet, as given.  - Dr. Caryl Comes recommends that you have a loop recorder (LINQ) implanted.   Follow-Up: - Your physician recommends that you schedule a follow-up appointment in: 10-14 days (from 03/16/16) for a wound check with the device clinic.  Any Additional Special Instructions Will Be Listed Below (If Applicable).     If you need a refill on your cardiac medications before your next appointment, please call your pharmacy.

## 2016-03-05 DIAGNOSIS — M6281 Muscle weakness (generalized): Secondary | ICD-10-CM | POA: Diagnosis not present

## 2016-03-05 DIAGNOSIS — M542 Cervicalgia: Secondary | ICD-10-CM | POA: Diagnosis not present

## 2016-03-05 DIAGNOSIS — M50321 Other cervical disc degeneration at C4-C5 level: Secondary | ICD-10-CM | POA: Diagnosis not present

## 2016-03-05 DIAGNOSIS — R293 Abnormal posture: Secondary | ICD-10-CM | POA: Diagnosis not present

## 2016-03-07 ENCOUNTER — Telehealth (HOSPITAL_COMMUNITY): Payer: Self-pay | Admitting: *Deleted

## 2016-03-07 DIAGNOSIS — J3089 Other allergic rhinitis: Secondary | ICD-10-CM | POA: Diagnosis not present

## 2016-03-07 DIAGNOSIS — M6281 Muscle weakness (generalized): Secondary | ICD-10-CM | POA: Diagnosis not present

## 2016-03-07 DIAGNOSIS — M542 Cervicalgia: Secondary | ICD-10-CM | POA: Diagnosis not present

## 2016-03-07 DIAGNOSIS — R293 Abnormal posture: Secondary | ICD-10-CM | POA: Diagnosis not present

## 2016-03-07 DIAGNOSIS — J301 Allergic rhinitis due to pollen: Secondary | ICD-10-CM | POA: Diagnosis not present

## 2016-03-07 DIAGNOSIS — M50321 Other cervical disc degeneration at C4-C5 level: Secondary | ICD-10-CM | POA: Diagnosis not present

## 2016-03-07 NOTE — Telephone Encounter (Signed)
Left message on voicemail in reference to upcoming appointment scheduled for 03/12/16. Phone number given for a call back so details instructions can be given. Hubbard Robinson, RN

## 2016-03-07 NOTE — Telephone Encounter (Signed)
Left message on voicemail in reference to upcoming appointment scheduled for 03/12/16 Phone number given for a call back so details instructions can be given.  Hubbard Robinson, RN

## 2016-03-12 ENCOUNTER — Telehealth (HOSPITAL_COMMUNITY): Payer: Self-pay | Admitting: *Deleted

## 2016-03-12 DIAGNOSIS — M6281 Muscle weakness (generalized): Secondary | ICD-10-CM | POA: Diagnosis not present

## 2016-03-12 DIAGNOSIS — R293 Abnormal posture: Secondary | ICD-10-CM | POA: Diagnosis not present

## 2016-03-12 DIAGNOSIS — M542 Cervicalgia: Secondary | ICD-10-CM | POA: Diagnosis not present

## 2016-03-12 DIAGNOSIS — M50321 Other cervical disc degeneration at C4-C5 level: Secondary | ICD-10-CM | POA: Diagnosis not present

## 2016-03-12 NOTE — Telephone Encounter (Signed)
Patient given detailed instructions per Myocardial Perfusion Study Information Sheet for the test on 03/13/16 at 1000. Patient notified to arrive 15 minutes early and that it is imperative to arrive on time for appointment to keep from having the test rescheduled.  If you need to cancel or reschedule your appointment, please call the office within 24 hours of your appointment. Failure to do so may result in a cancellation of your appointment, and a $50 no show fee. Patient verbalized understanding.Cohl Behrens, Ranae Palms

## 2016-03-13 ENCOUNTER — Ambulatory Visit (HOSPITAL_COMMUNITY): Payer: Medicare Other | Attending: Cardiology

## 2016-03-13 DIAGNOSIS — R55 Syncope and collapse: Secondary | ICD-10-CM | POA: Insufficient documentation

## 2016-03-13 DIAGNOSIS — I10 Essential (primary) hypertension: Secondary | ICD-10-CM | POA: Diagnosis not present

## 2016-03-13 DIAGNOSIS — E119 Type 2 diabetes mellitus without complications: Secondary | ICD-10-CM | POA: Insufficient documentation

## 2016-03-13 DIAGNOSIS — R0602 Shortness of breath: Secondary | ICD-10-CM | POA: Diagnosis not present

## 2016-03-13 LAB — MYOCARDIAL PERFUSION IMAGING
CHL CUP NUCLEAR SDS: 4
CHL CUP NUCLEAR SRS: 2
CHL CUP RESTING HR STRESS: 49 {beats}/min
LV dias vol: 133 mL (ref 62–150)
LV sys vol: 62 mL
Peak HR: 73 {beats}/min
RATE: 0.31
SSS: 6
TID: 0.93

## 2016-03-13 MED ORDER — TECHNETIUM TC 99M SESTAMIBI GENERIC - CARDIOLITE
10.4000 | Freq: Once | INTRAVENOUS | Status: AC | PRN
  Administered 2016-03-13: 10 via INTRAVENOUS

## 2016-03-13 MED ORDER — REGADENOSON 0.4 MG/5ML IV SOLN
0.4000 mg | Freq: Once | INTRAVENOUS | Status: AC
  Administered 2016-03-13: 0.4 mg via INTRAVENOUS

## 2016-03-13 MED ORDER — TECHNETIUM TC 99M SESTAMIBI GENERIC - CARDIOLITE
32.5000 | Freq: Once | INTRAVENOUS | Status: AC | PRN
  Administered 2016-03-13: 33 via INTRAVENOUS

## 2016-03-14 DIAGNOSIS — J301 Allergic rhinitis due to pollen: Secondary | ICD-10-CM | POA: Diagnosis not present

## 2016-03-14 DIAGNOSIS — J3089 Other allergic rhinitis: Secondary | ICD-10-CM | POA: Diagnosis not present

## 2016-03-15 ENCOUNTER — Telehealth: Payer: Self-pay | Admitting: Internal Medicine

## 2016-03-15 NOTE — Telephone Encounter (Signed)
Informed patient of results and verbal understanding expressed.  

## 2016-03-15 NOTE — Telephone Encounter (Signed)
New message     Patient calling stating Dr. Caryl Comes nurse called him - did not say why she was calling - returning call back

## 2016-03-15 NOTE — Telephone Encounter (Signed)
-----   Message from Deboraha Sprang, MD sent at 03/14/2016  6:28 PM EDT ----- Please Inform Patient that stress test showed no evidence of ischemia,  normal heart muscle function  Good report  Thanks

## 2016-03-16 ENCOUNTER — Encounter (HOSPITAL_COMMUNITY): Payer: Self-pay | Admitting: Internal Medicine

## 2016-03-16 ENCOUNTER — Encounter (HOSPITAL_COMMUNITY)
Admission: RE | Disposition: A | Discharge status: Home / Self Care | Payer: Self-pay | Source: Ambulatory Visit | Attending: Internal Medicine

## 2016-03-16 ENCOUNTER — Ambulatory Visit (HOSPITAL_COMMUNITY)
Admission: RE | Admit: 2016-03-16 | Discharge: 2016-03-16 | Disposition: A | Discharge status: Home / Self Care | Payer: Medicare Other | Source: Ambulatory Visit | Attending: Internal Medicine | Admitting: Internal Medicine

## 2016-03-16 DIAGNOSIS — E785 Hyperlipidemia, unspecified: Secondary | ICD-10-CM | POA: Diagnosis not present

## 2016-03-16 DIAGNOSIS — E119 Type 2 diabetes mellitus without complications: Secondary | ICD-10-CM | POA: Insufficient documentation

## 2016-03-16 DIAGNOSIS — I10 Essential (primary) hypertension: Secondary | ICD-10-CM | POA: Diagnosis not present

## 2016-03-16 DIAGNOSIS — M1990 Unspecified osteoarthritis, unspecified site: Secondary | ICD-10-CM | POA: Diagnosis not present

## 2016-03-16 DIAGNOSIS — Z7982 Long term (current) use of aspirin: Secondary | ICD-10-CM | POA: Diagnosis not present

## 2016-03-16 DIAGNOSIS — Z8249 Family history of ischemic heart disease and other diseases of the circulatory system: Secondary | ICD-10-CM | POA: Diagnosis not present

## 2016-03-16 DIAGNOSIS — D649 Anemia, unspecified: Secondary | ICD-10-CM | POA: Diagnosis not present

## 2016-03-16 DIAGNOSIS — Z87891 Personal history of nicotine dependence: Secondary | ICD-10-CM | POA: Insufficient documentation

## 2016-03-16 DIAGNOSIS — R001 Bradycardia, unspecified: Secondary | ICD-10-CM | POA: Insufficient documentation

## 2016-03-16 DIAGNOSIS — R55 Syncope and collapse: Secondary | ICD-10-CM | POA: Diagnosis not present

## 2016-03-16 HISTORY — PX: EP IMPLANTABLE DEVICE: SHX172B

## 2016-03-16 SURGERY — LOOP RECORDER INSERTION
Anesthesia: LOCAL

## 2016-03-16 MED ORDER — LIDOCAINE-EPINEPHRINE 1 %-1:100000 IJ SOLN
INTRAMUSCULAR | Status: AC
  Filled 2016-03-16: qty 1

## 2016-03-16 MED ORDER — LIDOCAINE-EPINEPHRINE 1 %-1:100000 IJ SOLN
INTRAMUSCULAR | Status: DC | PRN
  Administered 2016-03-16: 10 mL

## 2016-03-16 SURGICAL SUPPLY — 2 items
LOOP REVEAL LINQSYS (Prosthesis & Implant Heart) ×1 IMPLANT
PACK LOOP INSERTION (CUSTOM PROCEDURE TRAY) ×2 IMPLANT

## 2016-03-16 NOTE — Discharge Instructions (Signed)
Pt and family given written and verbal instructions.  See chart

## 2016-03-16 NOTE — Interval H&P Note (Signed)
History and Physical Interval Note:  03/16/2016 7:38 AM  Joseph Rocha  has presented today for surgery, with the diagnosis of syncope  The various methods of treatment have been discussed with the patient and family. After consideration of risks, benefits and other options for treatment, the patient has consented to  Procedure(s): Loop Recorder Insertion (N/A) as a surgical intervention .  The patient's history has been reviewed, patient examined, no change in status, stable for surgery.  I have reviewed the patient's chart and labs.  Questions were answered to the patient's satisfaction.     Virl Axe

## 2016-03-16 NOTE — H&P (View-Only) (Signed)
ELECTROPHYSIOLOGY CONSULT NOTE  Patient ID: Joseph Rocha, MRN: [id number], DOB/AGE: [birth year] 81 y.o. Admit date: (Not on file) Date of Consult: 03/01/2016  Primary Physician: Renato Shin, MD Primary Cardiologist: [facility] Consulting Physician [facility]  Chief Complaint: syncope   HPI Joseph Rocha is a 81 y.o. male  Referred for evaluation syncope. The patient is a notably poor historian. When asked about the duration of his spells, he says 3-5 years. He then recounts an event that occurred when his daughter was going off to college 15 years ago.  He describes these spells relatively stereotypically associated with a GI rumbling associated with a "numbness" that proceeds up through his chest accompanied by some nausea. By the time he gets to his neck over some seconds he then loses consciousness. He has fallen on board a ship. Fallen in the bathroom he has fallen at home. It is his wife's impression that these episodes leave him unconscious for 1-2 minutes. He describes a paucity of symptoms afterwards; however, on the episode 15 years ago he remembers that EMS to him up and his blood pressure which had been normal while recumbent fell precipitously upon standing  He has minimal orthostatic lightheadedness.  Cardiac evaluation in the past has included a remote Myoview which was normal; an echocardiogram obtained today demonstrated normal LV function. He describes tilt table testing etc., I was unable to find these records in the computer  He takes multiple medications. He is on to alpha blockers for his prostate. He has diabetes. He describes hypotension.  He is on Florinef as best as I can tell from the endocrinology notes not for the spells but for "dizziness" which on one occasion was "75% or better "following doubling of the dose     Past Medical History  Diagnosis Date  . Diabetes mellitus   . Hyperlipidemia   . Anemia   . Hypertension   . Allergy   . Pituitary  abnormality ([place]) 2004  . OA (osteoarthritis)   . Elevated PSA   . Hypogonadism male       Surgical History:  Past Surgical History  Procedure Laterality Date  . Lumbar disc surgery    . Knee arthroscopy Left   . Transphenoidal / transnasal hypophysectomy / resection pituitary tumor  2005  . Electrocardiogram  03/25/2006  . Doppler echocardiography  12/04/2001  . Meniscus repair Right may 2015  . Tonsillectomy       Home Meds: Prior to Admission medications   Medication Sig Start Date End Date Taking? Authorizing Provider  aspirin 81 MG tablet Take 81 mg by mouth daily.   Yes Historical Provider, MD  calcium carbonate (OS-CAL) 600 MG TABS Take 600 mg by mouth daily.   Yes Historical Provider, MD  cetirizine-pseudoephedrine (ZYRTEC-D) 5-120 MG tablet Take 1 tablet by mouth daily.   Yes Historical Provider, MD  Cholecalciferol (VITAMIN D3 PO) Take 1 capsule by mouth daily. 653mcg   Yes Historical Provider, MD  finasteride (PROSCAR) 5 MG tablet Take 5 mg by mouth daily.     Yes Historical Provider, MD  fish oil-omega-3 fatty acids 1000 MG capsule Take 1 g by mouth daily.   Yes Historical Provider, MD  fludrocortisone (FLORINEF) 0.1 MG tablet Take 1 tablet (0.1 mg total) by mouth 2 (two) times daily. 11/10/15  Yes Renato Shin, MD  folic acid (FOLVITE) 1 MG tablet Take 1 tablet (1 mg total) by mouth daily. 11/10/15  Yes Renato Shin, MD  KLOR-CON M20  20 MEQ tablet Take 20 mEq by mouth daily.  03/15/13  Yes Historical Provider, MD  Magnesium Oxide 400 (240 MG) MG TABS Take 1 tablet by mouth 2 (two) times daily. 05/14/14  Yes Donika K Patel, DO  meloxicam (MOBIC) 15 MG tablet Take 1 tablet (15 mg total) by mouth daily. 07/27/15  Yes Renato Shin, MD  methocarbamol (ROBAXIN) 500 MG tablet Take 500 mg by mouth at bedtime as needed for muscle spasms.   Yes Historical Provider, MD  Multiple Vitamins-Minerals (CENTRUM SILVER PO) Take 1 tablet by mouth daily.     Yes Historical Provider, MD  omeprazole  (PRILOSEC) 40 MG capsule Take 1 capsule (40 mg total) by mouth daily. 10/19/15  Yes Ladene Artist, MD  ondansetron (ZOFRAN) 4 MG tablet Take 1 tablet (4 mg total) by mouth every 8 (eight) hours as needed for nausea or vomiting. 11/24/14  Yes Renato Shin, MD  pioglitazone (ACTOS) 45 MG tablet Take 1 tablet (45 mg total) by mouth daily. 09/06/15  Yes Renato Shin, MD  polyethylene glycol powder (GLYCOLAX/MIRALAX) powder Mix 17 grams in 8 oz of water daily 10/19/15  Yes Ladene Artist, MD  pravastatin (PRAVACHOL) 80 MG tablet Take 1 tablet (80 mg total) by mouth daily. 09/06/15  Yes Renato Shin, MD  Tamsulosin HCl (FLOMAX) 0.4 MG CAPS Take 0.4 mg by mouth daily.     Yes Historical Provider, MD  Testosterone (AXIRON) 30 MG/ACT SOLN Place onto the skin.   Yes Historical Provider, MD    Allergies:  Allergies  Allergen Reactions  . Lipitor [Atorvastatin]     myalgias    Social History   Social History  . Marital Status: Married    Spouse Name: N/A  . Number of Children: 2  . Years of Education: N/A   Occupational History  . retired    Social History Main Topics  . Smoking status: Former Smoker    Quit date: 02/22/1957  . Smokeless tobacco: Never Used  . Alcohol Use: No  . Drug Use: No  . Sexual Activity: Not Currently   Other Topics Concern  . Not on file   Social History Narrative   Lives with wife.  They have two grown children.   He is retired from the [employer].     Family History  Problem Relation Age of Onset  . Cancer Brother     uncertain type  . Diabetes Mellitus II Mother     Deceased, 87s  . Heart attack Father   . Healthy Daughter      ROS:  Please see the history of present illness.     All other systems reviewed and negative.    Physical Exam:   Blood pressure 152/78, pulse 55, height 6\' 3"  (1.905 m), weight 231 lb 12.8 oz (105.144 kg). General: Well developed, well nourished male in no acute distress. Head: Normocephalic, atraumatic, sclera  non-icteric, no xanthomas, nares are without discharge. EENT: normal  Lymph Nodes:  none Neck: Negative for carotid bruits. JVD not elevated. Back:without scoliosis kyphosis Lungs: Clear bilaterally to auscultation without wheezes, rales, or rhonchi. Breathing is unlabored. Heart: RRR with S1 S2. 2/6 murmur . No rubs, or gallops appreciated. Abdomen: Soft, non-tender, non-distended with normoactive bowel sounds. No hepatomegaly. No rebound/guarding. No obvious abdominal masses. Msk:  Strength and tone appear normal for age. Extremities: No clubbing or cyanosis. No   edema.  Distal pedal pulses are 2+ and equal bilaterally. Skin: Warm and Dry Neuro: Alert and  oriented X 3. CN III-XII intact Grossly normal sensory and motor function . wlaks with a cane Psych:  Responds to questions appropriately with a normal affect.      Labs: Cardiac Enzymes No results for input(s): CKTOTAL, CKMB, TROPONINI in the last 72 hours. CBC Lab Results  Component Value Date   WBC 6.1 07/27/2015   HGB 11.8* 07/27/2015   HCT 35.5* 07/27/2015   MCV 91.7 07/27/2015   PLT 192.0 07/27/2015   PROTIME: No results for input(s): LABPROT, INR in the last 72 hours. Chemistry No results for input(s): NA, K, CL, CO2, BUN, CREATININE, CALCIUM, PROT, BILITOT, ALKPHOS, ALT, AST, GLUCOSE in the last 168 hours.  Invalid input(s): LABALBU Lipids Lab Results  Component Value Date   CHOL 309* 07/27/2015   HDL 40.00 07/27/2015   LDLCALC 243* 07/27/2015   TRIG 130.0 07/27/2015   BNP PRO B NATRIURETIC PEPTIDE (BNP)  Date/Time Value Ref Range Status  10/17/2009 12:00 AM 28.0 0.0-100.0 pg/mL Final   Thyroid Function Tests: No results for input(s): TSH, T4TOTAL, T3FREE, THYROIDAB in the last 72 hours.  Invalid input(s): FREET3 Miscellaneous No results found for: DDIMER  Radiology/Studies:  No results found.  EKG: Sinus rhythm at 54  intervals 20/ 10/45 Axis XXXII   Assessment and Plan: Syncope  Multiple  medications  Diabetes  Sinus bradycardia   The patient has a long-standing history of syncope. It is characterized by a recognizable prodrome which is of unclear duration. The episodes of syncope are of relatively long duration suggestive of a neurally mediated episode. The episode many years ago with orthostatic hypotension demonstrated by EMS supports that diagnosis although orthostasis is a fairly nonspecific finding in the context of the syncope evaluation  The fact that his LV function is normal and his ECG is normal makes it unlikely that this is a primary malignant arrhythmia.  I agree with Dr. Tamala Julian that a loop recorder is our best tool. It may be that the GI symptoms are a trigger or a epiphenomena. It is hard to know. Sometimes Bentyl and help with this.  I am also concerned about the neck discomfort that has occurred with his syncopal episodes and wonder as to whether that might be an anginal equivalent. He has a remote negative Myoview.  I doubt his bradycardia is contributing  At this point I think it is reasonable to maintain his Florinef given the notes from 2016 describing improvement in his dizziness       Virl Axe

## 2016-03-21 DIAGNOSIS — J3089 Other allergic rhinitis: Secondary | ICD-10-CM | POA: Diagnosis not present

## 2016-03-21 DIAGNOSIS — J301 Allergic rhinitis due to pollen: Secondary | ICD-10-CM | POA: Diagnosis not present

## 2016-03-28 DIAGNOSIS — J301 Allergic rhinitis due to pollen: Secondary | ICD-10-CM | POA: Diagnosis not present

## 2016-03-28 DIAGNOSIS — J3089 Other allergic rhinitis: Secondary | ICD-10-CM | POA: Diagnosis not present

## 2016-03-29 ENCOUNTER — Encounter: Payer: Self-pay | Admitting: Internal Medicine

## 2016-03-29 ENCOUNTER — Ambulatory Visit (INDEPENDENT_AMBULATORY_CARE_PROVIDER_SITE_OTHER): Payer: Medicare Other | Admitting: *Deleted

## 2016-03-29 DIAGNOSIS — R55 Syncope and collapse: Secondary | ICD-10-CM

## 2016-03-29 LAB — CUP PACEART INCLINIC DEVICE CHECK: MDC IDC SESS DTM: 20170525142505

## 2016-03-29 NOTE — Progress Notes (Signed)
Wound check in clinic s/p ILR implant. Wound well healed without redness or edema. Incision edges approximated. Normal ILR function. Battery status: GOOD. R-waves 0.78mV. 0 symptom episodes, 0 tachy episodes, 0 pause episodes, 0 brady episodes. 0 AF episodes (0% burden). Monthly summary reports and ROV with SK in 3 months. Patient education completed including wound care and monthly billing for summary reports.

## 2016-04-05 DIAGNOSIS — J301 Allergic rhinitis due to pollen: Secondary | ICD-10-CM | POA: Diagnosis not present

## 2016-04-05 DIAGNOSIS — J3089 Other allergic rhinitis: Secondary | ICD-10-CM | POA: Diagnosis not present

## 2016-04-06 ENCOUNTER — Ambulatory Visit (INDEPENDENT_AMBULATORY_CARE_PROVIDER_SITE_OTHER): Payer: Medicare Other | Admitting: Endocrinology

## 2016-04-06 ENCOUNTER — Ambulatory Visit
Admission: RE | Admit: 2016-04-06 | Discharge: 2016-04-06 | Disposition: A | Discharge status: Home / Self Care | Payer: Medicare Other | Source: Ambulatory Visit | Attending: Endocrinology | Admitting: Endocrinology

## 2016-04-06 VITALS — BP 118/70 | HR 56 | Temp 98.0°F | Wt 227.0 lb

## 2016-04-06 DIAGNOSIS — D649 Anemia, unspecified: Secondary | ICD-10-CM | POA: Diagnosis not present

## 2016-04-06 DIAGNOSIS — E119 Type 2 diabetes mellitus without complications: Secondary | ICD-10-CM

## 2016-04-06 DIAGNOSIS — G622 Polyneuropathy due to other toxic agents: Secondary | ICD-10-CM

## 2016-04-06 DIAGNOSIS — I950 Idiopathic hypotension: Secondary | ICD-10-CM | POA: Diagnosis not present

## 2016-04-06 DIAGNOSIS — E291 Testicular hypofunction: Secondary | ICD-10-CM

## 2016-04-06 DIAGNOSIS — G619 Inflammatory polyneuropathy, unspecified: Secondary | ICD-10-CM

## 2016-04-06 DIAGNOSIS — R200 Anesthesia of skin: Secondary | ICD-10-CM | POA: Insufficient documentation

## 2016-04-06 DIAGNOSIS — R0789 Other chest pain: Secondary | ICD-10-CM | POA: Insufficient documentation

## 2016-04-06 LAB — BASIC METABOLIC PANEL
BUN: 27 mg/dL — AB (ref 6–23)
CALCIUM: 9.2 mg/dL (ref 8.4–10.5)
CO2: 27 mEq/L (ref 19–32)
Chloride: 103 mEq/L (ref 96–112)
Creatinine, Ser: 1.45 mg/dL (ref 0.40–1.50)
GFR: 60.35 mL/min (ref >60.00)
GLUCOSE: 103 mg/dL — AB (ref 70–99)
POTASSIUM: 3.7 meq/L (ref 3.5–5.1)
SODIUM: 138 meq/L (ref 135–145)

## 2016-04-06 LAB — CBC WITH DIFFERENTIAL/PLATELET
BASOS PCT: 0.8 % (ref 0.0–3.0)
Basophils Absolute: 0 10*3/uL (ref 0.0–0.1)
EOS ABS: 0.3 10*3/uL (ref 0.0–0.7)
EOS PCT: 4.9 % (ref 0.0–5.0)
HEMATOCRIT: 34.4 % — AB (ref 39.0–52.0)
Hemoglobin: 11.6 g/dL — ABNORMAL LOW (ref 13.0–17.0)
Lymphocytes Relative: 39.4 % (ref 12.0–46.0)
Lymphs Abs: 2.4 10*3/uL (ref 0.7–4.0)
MCHC: 33.6 g/dL (ref 30.0–36.0)
MCV: 90.5 fl (ref 78.0–100.0)
MONO ABS: 0.6 10*3/uL (ref 0.1–1.0)
Monocytes Relative: 10.5 % (ref 3.0–12.0)
NEUTROS ABS: 2.7 10*3/uL (ref 1.4–7.7)
Neutrophils Relative %: 44.4 % (ref 43.0–77.0)
PLATELETS: 168 10*3/uL (ref 150.0–400.0)
RBC: 3.81 Mil/uL — ABNORMAL LOW (ref 4.22–5.81)
RDW: 14.8 % (ref 11.5–15.5)
WBC: 6 10*3/uL (ref 4.0–10.5)

## 2016-04-06 LAB — VITAMIN B12: VITAMIN B 12: 505 pg/mL (ref 211–911)

## 2016-04-06 LAB — TSH: TSH: 4.65 u[IU]/mL — AB (ref 0.35–4.50)

## 2016-04-06 LAB — POCT GLYCOSYLATED HEMOGLOBIN (HGB A1C): Hemoglobin A1C: 6

## 2016-04-06 LAB — IBC PANEL
IRON: 67 ug/dL (ref 42–165)
Saturation Ratios: 21.7 % (ref 20.0–50.0)
Transferrin: 221 mg/dL (ref 212.0–360.0)

## 2016-04-06 LAB — T4, FREE: Free T4: 0.58 ng/dL — ABNORMAL LOW (ref 0.60–1.60)

## 2016-04-06 LAB — HEMOGLOBIN A1C: Hgb A1c MFr Bld: 6.2 % (ref 4.6–6.5)

## 2016-04-06 NOTE — Patient Instructions (Addendum)
blood tests are requested for you today.  We'll let you know about the results.  

## 2016-04-06 NOTE — Progress Notes (Signed)
Subjective:    Patient ID: Joseph Rocha, male    DOB: 1935-08-04, 81 y.o.   MRN: [id number]  HPI  The state of at least three ongoing medical problems is addressed today, with interval history of each noted here: Renal insufficiency: pt states fatigue.  Anemia: he does not take fe tabs.  He denies BRBPR.  Hypogonadism: urol rx's axiron.  Denies decreased urinary stream.   Hypotension: no recent LOC Past Medical History  Diagnosis Date  . Diabetes mellitus   . Hyperlipidemia   . Anemia   . Hypertension   . Allergy   . Pituitary abnormality ([place]) 2004  . OA (osteoarthritis)   . Elevated PSA   . Hypogonadism male     Past Surgical History  Procedure Laterality Date  . Lumbar disc surgery    . Knee arthroscopy Left   . Transphenoidal / transnasal hypophysectomy / resection pituitary tumor  2005  . Electrocardiogram  03/25/2006  . Doppler echocardiography  12/04/2001  . Meniscus repair Right may 2015  . Tonsillectomy    . Ep implantable device N/A 03/16/2016    Procedure: Loop Recorder Insertion;  Surgeon: Deboraha Sprang, MD;  Location: [facility];  Service: Cardiovascular;  Laterality: N/A;    Social History   Social History  . Marital Status: Married    Spouse Name: N/A  . Number of Children: 2  . Years of Education: N/A   Occupational History  . retired    Social History Main Topics  . Smoking status: Former Smoker    Quit date: 02/22/1957  . Smokeless tobacco: Never Used  . Alcohol Use: No  . Drug Use: No  . Sexual Activity: Not Currently   Other Topics Concern  . Not on file   Social History Narrative   Lives with wife.  They have two grown children.   He is retired from the [employer].    Current Outpatient Prescriptions on File Prior to Visit  Medication Sig Dispense Refill  . aspirin 81 MG tablet Take 81 mg by mouth daily.    . calcium carbonate (OS-CAL) 600 MG TABS Take 600 mg by mouth daily.    . cetirizine-pseudoephedrine  (ZYRTEC-D) 5-120 MG tablet Take 1 tablet by mouth daily.    . Cholecalciferol (VITAMIN D3 PO) Take 600 mcg by mouth daily.     . finasteride (PROSCAR) 5 MG tablet Take 5 mg by mouth daily.      . fish oil-omega-3 fatty acids 1000 MG capsule Take 1 g by mouth daily.    . fludrocortisone (FLORINEF) 0.1 MG tablet Take 1 tablet (0.1 mg total) by mouth 2 (two) times daily. (Patient taking differently: Take 0.1 mg by mouth daily. ) 60 tablet 11  . folic acid (FOLVITE) 1 MG tablet Take 1 tablet (1 mg total) by mouth daily. 90 tablet 3  . KLOR-CON M20 20 MEQ tablet Take 20 mEq by mouth daily.     . Magnesium Oxide 400 (240 MG) MG TABS Take 1 tablet by mouth 2 (two) times daily. (Patient taking differently: Take 1 tablet by mouth daily. ) 180 tablet 3  . meloxicam (MOBIC) 15 MG tablet Take 1 tablet (15 mg total) by mouth daily. 30 tablet 8  . methocarbamol (ROBAXIN) 500 MG tablet Take 500 mg by mouth at bedtime as needed for muscle spasms.    . Multiple Vitamins-Minerals (CENTRUM SILVER PO) Take 1 tablet by mouth daily.      Marland Kitchen omeprazole (  PRILOSEC) 40 MG capsule Take 1 capsule (40 mg total) by mouth daily. 30 capsule 11  . oxymetazoline (AFRIN) 0.05 % nasal spray Place 1 spray into both nostrils at bedtime.    . pioglitazone (ACTOS) 45 MG tablet Take 1 tablet (45 mg total) by mouth daily. 90 tablet 2  . polyethylene glycol powder (GLYCOLAX/MIRALAX) powder Mix 17 grams in 8 oz of water daily (Patient taking differently: Take 0.5 Containers by mouth every other day. Mix 17 grams in 8 oz of water daily) 255 g 5  . pravastatin (PRAVACHOL) 80 MG tablet Take 1 tablet (80 mg total) by mouth daily. 90 tablet 3  . Tamsulosin HCl (FLOMAX) 0.4 MG CAPS Take 0.4 mg by mouth daily.      . Testosterone (AXIRON) 30 MG/ACT SOLN Place 1 each onto the skin daily.      No current facility-administered medications on file prior to visit.    Allergies  Allergen Reactions  . Lipitor [Atorvastatin] Other (See Comments)     Myalgias, cramps in hand    Family History  Problem Relation Age of Onset  . Cancer Brother     uncertain type  . Diabetes Mellitus II Mother     Deceased, 2s  . Heart attack Father   . Healthy Daughter     BP 118/70 mmHg  Pulse 56  Temp(Src) 98 F (36.7 C) (Oral)  Wt 227 lb (102.967 kg)  SpO2 99%   Review of Systems Denies hematuria and sob.  He has slight numbness of the feet    Objective:   Physical Exam VS: see vs page GEN: no distress NECK: supple, thyroid is not enlarged CHEST WALL: no deformity.  Slightly tender at the right upper post chest wall LUNGS: clear to auscultation BREASTS:  No gynecomastia CV: reg rate and rhythm, no murmur MUSCULOSKELETAL: muscle bulk and strength are grossly normal.  no obvious joint swelling.  gait is normal and steady EXTEMITIES: no edema SKIN:  Normal texture and temperature.  No rash or suspicious lesion is visible.   NODES:  None palpable at the neck PSYCH: alert, well-oriented.  Does not appear anxious nor depressed.   Lab Results  Component Value Date   TESTOSTERONE 190* 04/06/2016   Lab Results  Component Value Date   WBC 6.0 04/06/2016   HGB 11.6* 04/06/2016   HCT 34.4* 04/06/2016   MCV 90.5 04/06/2016   PLT 168.0 04/06/2016   Lab Results  Component Value Date   IRON 67 04/06/2016   Lab Results  Component Value Date   CREATININE 1.45 04/06/2016   BUN 27* 04/06/2016   NA 138 04/06/2016   K 3.7 04/06/2016   CL 103 04/06/2016   CO2 27 04/06/2016       Assessment & Plan:  Hypogonadism: pt is advised to f/u with urol Anemia: mild: we'll follow. Renal insufficiency: stable: we'll follow.   Hypotension: well-controlled.   Patient is advised the following: Patient Instructions  blood tests are requested for you today.  We'll let you know about the results.      Renato Shin, MD

## 2016-04-07 LAB — D-DIMER, QUANTITATIVE: D-Dimer, Quant: 0.6 ug/mL-FEU — ABNORMAL HIGH (ref 0.00–0.48)

## 2016-04-08 LAB — TESTOSTERONE,FREE AND TOTAL
TESTOSTERONE: 190 ng/dL — AB (ref 348–1197)
Testosterone, Free: 2.3 pg/mL — ABNORMAL LOW (ref 6.6–18.1)

## 2016-04-09 ENCOUNTER — Other Ambulatory Visit: Payer: Self-pay | Admitting: Endocrinology

## 2016-04-09 DIAGNOSIS — R0789 Other chest pain: Secondary | ICD-10-CM

## 2016-04-09 MED ORDER — LEVOTHYROXINE SODIUM 50 MCG PO TABS: 50.0000 ug | ORAL_TABLET | Freq: Every day | ORAL | Status: DC

## 2016-04-11 DIAGNOSIS — J3089 Other allergic rhinitis: Secondary | ICD-10-CM | POA: Diagnosis not present

## 2016-04-11 DIAGNOSIS — J301 Allergic rhinitis due to pollen: Secondary | ICD-10-CM | POA: Diagnosis not present

## 2016-04-12 ENCOUNTER — Telehealth: Payer: Self-pay | Admitting: Endocrinology

## 2016-04-12 NOTE — Telephone Encounter (Signed)
Pt wants a call back [phone] did not specify why

## 2016-04-13 NOTE — Telephone Encounter (Signed)
I contacted the pt. Pt wanted to verify what the CT would be checking. Pt advised the CT would be a CT of the chest. Pt voiced understanding.

## 2016-04-16 ENCOUNTER — Ambulatory Visit
Admission: RE | Admit: 2016-04-16 | Discharge: 2016-04-16 | Disposition: A | Discharge status: Home / Self Care | Payer: Medicare Other | Source: Ambulatory Visit | Attending: Endocrinology | Admitting: Endocrinology

## 2016-04-16 ENCOUNTER — Other Ambulatory Visit: Payer: Medicare Other

## 2016-04-16 ENCOUNTER — Ambulatory Visit (INDEPENDENT_AMBULATORY_CARE_PROVIDER_SITE_OTHER): Payer: Medicare Other | Admitting: *Deleted

## 2016-04-16 ENCOUNTER — Telehealth: Payer: Self-pay

## 2016-04-16 DIAGNOSIS — R0789 Other chest pain: Secondary | ICD-10-CM

## 2016-04-16 DIAGNOSIS — R0602 Shortness of breath: Secondary | ICD-10-CM | POA: Diagnosis not present

## 2016-04-16 DIAGNOSIS — R55 Syncope and collapse: Secondary | ICD-10-CM

## 2016-04-16 MED ORDER — IOPAMIDOL (ISOVUE-370) INJECTION 76%
100.0000 mL | Freq: Once | INTRAVENOUS | Status: AC | PRN
  Administered 2016-04-16: 100 mL via INTRAVENOUS

## 2016-04-16 NOTE — Telephone Encounter (Signed)
Joseph Rocha with [facility] called to report the pt's CT. CT was negative for pulmonary embolism. Coronary artery calcification. Enlarged pulmonary arteries indicative of pulmonary hypertension.

## 2016-04-16 NOTE — Progress Notes (Signed)
[facility] Summary Report / Loop Recorder 

## 2016-04-17 NOTE — Telephone Encounter (Signed)
Gave pt the results and it is noted in the imaging tab.  Pt is requesting a copy of the images and the myocardial perfusion test that was done.

## 2016-04-17 NOTE — Telephone Encounter (Signed)
I printed the results and mailed it to the pt.

## 2016-04-19 DIAGNOSIS — J3089 Other allergic rhinitis: Secondary | ICD-10-CM | POA: Diagnosis not present

## 2016-04-19 DIAGNOSIS — J301 Allergic rhinitis due to pollen: Secondary | ICD-10-CM | POA: Diagnosis not present

## 2016-04-20 ENCOUNTER — Other Ambulatory Visit: Payer: Self-pay | Admitting: Endocrinology

## 2016-04-20 MED ORDER — ROSUVASTATIN CALCIUM 5 MG PO TABS: 5.0000 mg | ORAL_TABLET | Freq: Every day | ORAL | Status: DC

## 2016-05-02 DIAGNOSIS — J301 Allergic rhinitis due to pollen: Secondary | ICD-10-CM | POA: Diagnosis not present

## 2016-05-02 DIAGNOSIS — J3089 Other allergic rhinitis: Secondary | ICD-10-CM | POA: Diagnosis not present

## 2016-05-10 DIAGNOSIS — J3089 Other allergic rhinitis: Secondary | ICD-10-CM | POA: Diagnosis not present

## 2016-05-10 DIAGNOSIS — J301 Allergic rhinitis due to pollen: Secondary | ICD-10-CM | POA: Diagnosis not present

## 2016-05-10 LAB — CUP PACEART REMOTE DEVICE CHECK: Date Time Interrogation Session: 20170611110512

## 2016-05-15 ENCOUNTER — Ambulatory Visit (INDEPENDENT_AMBULATORY_CARE_PROVIDER_SITE_OTHER): Payer: Medicare Other | Admitting: *Deleted

## 2016-05-15 ENCOUNTER — Telehealth: Payer: Self-pay | Admitting: Endocrinology

## 2016-05-15 DIAGNOSIS — R55 Syncope and collapse: Secondary | ICD-10-CM | POA: Diagnosis not present

## 2016-05-15 NOTE — Progress Notes (Signed)
[facility] Summary Report / Loop Recorder 

## 2016-05-15 NOTE — Telephone Encounter (Signed)
Attempted the reach the pt. Pt was unavalible. Spoke with the pt's wife and she stated the pt had asked her to verify if he was to be taking Crestor or Pravastatin. I advised the pt's wife Dr. Loanne Drilling had changed his cholesterol medication to Crestor on 04/21/2016. She voiced understadning and stated she would advise the pt.

## 2016-05-15 NOTE — Telephone Encounter (Signed)
PT requests call back from [facility]

## 2016-05-16 DIAGNOSIS — J3089 Other allergic rhinitis: Secondary | ICD-10-CM | POA: Diagnosis not present

## 2016-05-16 DIAGNOSIS — J301 Allergic rhinitis due to pollen: Secondary | ICD-10-CM | POA: Diagnosis not present

## 2016-05-23 DIAGNOSIS — J3089 Other allergic rhinitis: Secondary | ICD-10-CM | POA: Diagnosis not present

## 2016-05-23 DIAGNOSIS — J301 Allergic rhinitis due to pollen: Secondary | ICD-10-CM | POA: Diagnosis not present

## 2016-05-29 ENCOUNTER — Telehealth: Payer: Self-pay | Admitting: Endocrinology

## 2016-05-29 NOTE — Telephone Encounter (Signed)
[employer] [employer] will be calling to request and testing supplies, calling to give you a heads up.

## 2016-05-30 DIAGNOSIS — J3089 Other allergic rhinitis: Secondary | ICD-10-CM | POA: Diagnosis not present

## 2016-05-30 DIAGNOSIS — J301 Allergic rhinitis due to pollen: Secondary | ICD-10-CM | POA: Diagnosis not present

## 2016-06-01 DIAGNOSIS — H6121 Impacted cerumen, right ear: Secondary | ICD-10-CM | POA: Diagnosis not present

## 2016-06-01 DIAGNOSIS — J310 Chronic rhinitis: Secondary | ICD-10-CM | POA: Diagnosis not present

## 2016-06-04 DIAGNOSIS — J301 Allergic rhinitis due to pollen: Secondary | ICD-10-CM | POA: Diagnosis not present

## 2016-06-04 DIAGNOSIS — J3089 Other allergic rhinitis: Secondary | ICD-10-CM | POA: Diagnosis not present

## 2016-06-08 DIAGNOSIS — J301 Allergic rhinitis due to pollen: Secondary | ICD-10-CM | POA: Diagnosis not present

## 2016-06-08 DIAGNOSIS — J3089 Other allergic rhinitis: Secondary | ICD-10-CM | POA: Diagnosis not present

## 2016-06-12 LAB — CUP PACEART REMOTE DEVICE CHECK: Date Time Interrogation Session: 20170711120516

## 2016-06-13 DIAGNOSIS — J3089 Other allergic rhinitis: Secondary | ICD-10-CM | POA: Diagnosis not present

## 2016-06-13 DIAGNOSIS — J301 Allergic rhinitis due to pollen: Secondary | ICD-10-CM | POA: Diagnosis not present

## 2016-06-14 ENCOUNTER — Ambulatory Visit (INDEPENDENT_AMBULATORY_CARE_PROVIDER_SITE_OTHER): Payer: Medicare Other | Admitting: *Deleted

## 2016-06-14 DIAGNOSIS — R55 Syncope and collapse: Secondary | ICD-10-CM

## 2016-06-14 NOTE — Progress Notes (Signed)
[facility] Summary Report / Loop Recorder 

## 2016-06-15 NOTE — Telephone Encounter (Signed)
I contacted the pt's wife and advised we have received the paper work for his diabetic supplies. I advised the pt's wife Dr. Loanne Drilling is currently out of the office and will return on 8/14/217. Forms will be reviewed and signed once he returns.

## 2016-06-15 NOTE — Telephone Encounter (Signed)
Patient stated [employer] have been calling and haven't heard back from you about his medical supplies. please call him back as soon possible

## 2016-06-20 DIAGNOSIS — J3089 Other allergic rhinitis: Secondary | ICD-10-CM | POA: Diagnosis not present

## 2016-06-20 DIAGNOSIS — J301 Allergic rhinitis due to pollen: Secondary | ICD-10-CM | POA: Diagnosis not present

## 2016-06-27 DIAGNOSIS — J3089 Other allergic rhinitis: Secondary | ICD-10-CM | POA: Diagnosis not present

## 2016-06-27 DIAGNOSIS — J301 Allergic rhinitis due to pollen: Secondary | ICD-10-CM | POA: Diagnosis not present

## 2016-06-28 ENCOUNTER — Other Ambulatory Visit: Payer: Self-pay | Admitting: Endocrinology

## 2016-06-28 DIAGNOSIS — R351 Nocturia: Secondary | ICD-10-CM | POA: Diagnosis not present

## 2016-06-28 DIAGNOSIS — Z125 Encounter for screening for malignant neoplasm of prostate: Secondary | ICD-10-CM | POA: Diagnosis not present

## 2016-06-28 DIAGNOSIS — N401 Enlarged prostate with lower urinary tract symptoms: Secondary | ICD-10-CM | POA: Diagnosis not present

## 2016-06-28 DIAGNOSIS — E291 Testicular hypofunction: Secondary | ICD-10-CM | POA: Diagnosis not present

## 2016-07-04 ENCOUNTER — Encounter: Payer: Self-pay | Admitting: Internal Medicine

## 2016-07-04 ENCOUNTER — Ambulatory Visit (INDEPENDENT_AMBULATORY_CARE_PROVIDER_SITE_OTHER): Payer: Medicare Other | Admitting: Internal Medicine

## 2016-07-04 ENCOUNTER — Telehealth: Payer: Self-pay | Admitting: Endocrinology

## 2016-07-04 VITALS — BP 110/70 | HR 57 | Ht 75.0 in | Wt 233.2 lb

## 2016-07-04 DIAGNOSIS — R55 Syncope and collapse: Secondary | ICD-10-CM

## 2016-07-04 DIAGNOSIS — J301 Allergic rhinitis due to pollen: Secondary | ICD-10-CM | POA: Diagnosis not present

## 2016-07-04 DIAGNOSIS — J3089 Other allergic rhinitis: Secondary | ICD-10-CM | POA: Diagnosis not present

## 2016-07-04 NOTE — Telephone Encounter (Signed)
Patient ask you to call him when you get a chance. [phone]

## 2016-07-04 NOTE — Progress Notes (Signed)
Patient Care Team: Renato Shin, MD as PCP - General Carolan Clines, MD as Attending Physician (Urology) Belva Crome, MD as Attending Physician (Cardiology) Erline Levine, MD as Attending Physician (Neurosurgery) Ulyses Southward, MD (Allergy and Immunology)   HPI  Joseph Rocha is a 81 y.o. male Seen in follow-up for recurrent syncope.  His episodes are stereotypical. Cardiac evaluation demonstrated a normal Myoview normal echo and normal ECG.     Past Medical History:  Diagnosis Date  . Allergy   . Anemia   . Diabetes mellitus   . Elevated PSA   . Hyperlipidemia   . Hypertension   . Hypogonadism male   . OA (osteoarthritis)   . 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 DOPPLER ECHOCARDIOGRAPHY  12/04/2001  . ELECTROCARDIOGRAM  03/25/2006  . EP IMPLANTABLE DEVICE N/A 03/16/2016   Procedure: Loop Recorder Insertion;  Surgeon: Deboraha Sprang, MD;  Location: [facility];  Service: Cardiovascular;  Laterality: N/A;  . KNEE ARTHROSCOPY Left   . LUMBAR DISC SURGERY    . MENISCUS REPAIR Right may 2015  . TONSILLECTOMY    . TRANSPHENOIDAL / TRANSNASAL HYPOPHYSECTOMY / RESECTION PITUITARY TUMOR  2005    Current Outpatient Prescriptions  Medication Sig Dispense Refill  . aspirin 81 MG tablet Take 81 mg by mouth daily.    . calcium carbonate (OS-CAL) 600 MG TABS Take 600 mg by mouth daily.    . cetirizine-pseudoephedrine (ZYRTEC-D) 5-120 MG tablet Take 1 tablet by mouth daily.    . Cholecalciferol (VITAMIN D3 PO) Take 600 mcg by mouth daily.     . finasteride (PROSCAR) 5 MG tablet Take 5 mg by mouth daily.      . fish oil-omega-3 fatty acids 1000 MG capsule Take 1 g by mouth daily.    . fludrocortisone (FLORINEF) 0.1 MG tablet Take 0.1 mg by mouth daily.    . folic acid (FOLVITE) 1 MG tablet Take 1 tablet (1 mg total) by mouth daily. 90 tablet 3  . KLOR-CON M20 20 MEQ tablet Take 20 mEq by mouth daily.      Marland Kitchen levothyroxine (SYNTHROID, LEVOTHROID) 50 MCG tablet Take 1 tablet (50 mcg total) by mouth daily. 90 tablet 3  . magnesium oxide (MAG-OX) 400 MG tablet Take 240 mg by mouth daily.    . meloxicam (MOBIC) 15 MG tablet Take 1 tablet (15 mg total) by mouth daily. 30 tablet 8  . methocarbamol (ROBAXIN) 500 MG tablet Take 500 mg by mouth at bedtime as needed for muscle spasms.    . Multiple Vitamins-Minerals (CENTRUM SILVER PO) Take 1 tablet by mouth daily.      Marland Kitchen omeprazole (PRILOSEC) 40 MG capsule Take 1 capsule (40 mg total) by mouth daily. 30 capsule 11  . pioglitazone (ACTOS) 45 MG tablet Take 45 mg by mouth daily.    . polyethylene glycol (MIRALAX / GLYCOLAX) packet Take 17 g by mouth every other day. Mix with water    . rosuvastatin (CRESTOR) 5 MG tablet Take 1 tablet (5 mg total) by mouth daily. 30 tablet 11  . Tamsulosin HCl (FLOMAX) 0.4 MG CAPS Take 0.4 mg by mouth daily.      . Testosterone (AXIRON) 30 MG/ACT SOLN Place 1 each onto the skin daily.      No current facility-administered medications for this visit.     Allergies  Allergen Reactions  . Lipitor [Atorvastatin] Other (  See Comments)    Myalgias, cramps in hand      Review of Systems negative except from HPI and PMH  Physical Exam BP 110/70   Pulse (!) 57   Ht 6\' 3"  (1.905 m)   Wt 233 lb 3.2 oz (105.8 kg)   SpO2 97%   BMI 29.15 kg/m  Well developed and well nourished in no acute distress HENT normal E scleral and icterus clear Neck Supple JVP flat; carotids brisk and full Clear to ausculation Device pocket well healed; without hematoma or erythema.  There is no tethering  Regular rate and rhythm, no murmurs gallops or rub Soft with active bowel sounds No clubbing cyanosis  Edema Alert and oriented, grossly normal motor and sensory function Skin Warm and Dry    Assessment and  Plan Syncope  Implantable loop recorder  Sinus bradycardia  No interval syncope  Strong [place]     Current  medicines are reviewed at length with the patient today .  The patient does not  have concerns regarding medicines.

## 2016-07-04 NOTE — Patient Instructions (Signed)

## 2016-07-05 LAB — CUP PACEART REMOTE DEVICE CHECK: MDC IDC SESS DTM: 20170810120602

## 2016-07-05 MED ORDER — ROSUVASTATIN CALCIUM 5 MG PO TABS: 5.0000 mg | ORAL_TABLET | Freq: Every day | ORAL | 1 refills | Status: DC

## 2016-07-05 NOTE — Progress Notes (Signed)
Carelink summary report received. Battery status OK. Normal device function. No new symptom episodes, tachy episodes, brady, or pause episodes. No new AF episodes. Monthly summary reports and ROV/PRN 

## 2016-07-05 NOTE — Telephone Encounter (Signed)
I contacted patient and he requested crestor to be submitted for a 90 day supply to [facility] [address]. Refill submitted per request.

## 2016-07-12 DIAGNOSIS — J3089 Other allergic rhinitis: Secondary | ICD-10-CM | POA: Diagnosis not present

## 2016-07-12 DIAGNOSIS — J301 Allergic rhinitis due to pollen: Secondary | ICD-10-CM | POA: Diagnosis not present

## 2016-07-16 ENCOUNTER — Ambulatory Visit (INDEPENDENT_AMBULATORY_CARE_PROVIDER_SITE_OTHER): Payer: Medicare Other | Admitting: *Deleted

## 2016-07-16 DIAGNOSIS — R55 Syncope and collapse: Secondary | ICD-10-CM

## 2016-07-16 NOTE — Progress Notes (Signed)
[facility] Summary Report / Loop Recorder 

## 2016-07-18 DIAGNOSIS — J301 Allergic rhinitis due to pollen: Secondary | ICD-10-CM | POA: Diagnosis not present

## 2016-07-18 DIAGNOSIS — J3089 Other allergic rhinitis: Secondary | ICD-10-CM | POA: Diagnosis not present

## 2016-07-25 DIAGNOSIS — J3089 Other allergic rhinitis: Secondary | ICD-10-CM | POA: Diagnosis not present

## 2016-07-25 DIAGNOSIS — J301 Allergic rhinitis due to pollen: Secondary | ICD-10-CM | POA: Diagnosis not present

## 2016-07-26 ENCOUNTER — Ambulatory Visit (INDEPENDENT_AMBULATORY_CARE_PROVIDER_SITE_OTHER): Payer: Medicare Other | Admitting: Endocrinology

## 2016-07-26 ENCOUNTER — Encounter: Payer: Self-pay | Admitting: Endocrinology

## 2016-07-26 VITALS — BP 117/62 | HR 55 | Ht 75.0 in | Wt 232.0 lb

## 2016-07-26 DIAGNOSIS — I950 Idiopathic hypotension: Secondary | ICD-10-CM | POA: Diagnosis not present

## 2016-07-26 DIAGNOSIS — E7800 Pure hypercholesterolemia, unspecified: Secondary | ICD-10-CM

## 2016-07-26 DIAGNOSIS — E119 Type 2 diabetes mellitus without complications: Secondary | ICD-10-CM

## 2016-07-26 DIAGNOSIS — D649 Anemia, unspecified: Secondary | ICD-10-CM

## 2016-07-26 DIAGNOSIS — E291 Testicular hypofunction: Secondary | ICD-10-CM

## 2016-07-26 DIAGNOSIS — E039 Hypothyroidism, unspecified: Secondary | ICD-10-CM | POA: Insufficient documentation

## 2016-07-26 DIAGNOSIS — E038 Other specified hypothyroidism: Secondary | ICD-10-CM

## 2016-07-26 LAB — HEPATIC FUNCTION PANEL
ALT: 17 U/L (ref 0–53)
AST: 24 U/L (ref 0–37)
Albumin: 3.6 g/dL (ref 3.5–5.2)
Alkaline Phosphatase: 52 U/L (ref 39–117)
BILIRUBIN DIRECT: 0 mg/dL (ref 0.0–0.3)
BILIRUBIN TOTAL: 0.4 mg/dL (ref 0.2–1.2)
Total Protein: 6.9 g/dL (ref 6.0–8.3)

## 2016-07-26 LAB — LIPID PANEL
CHOLESTEROL: 171 mg/dL (ref 0–200)
HDL: 41.5 mg/dL (ref >39.00)
LDL CALC: 112 mg/dL — AB (ref 0–99)
NonHDL: 129.45
Total CHOL/HDL Ratio: 4
Triglycerides: 85 mg/dL (ref 0.0–149.0)
VLDL: 17 mg/dL (ref 0.0–40.0)

## 2016-07-26 LAB — CBC WITH DIFFERENTIAL/PLATELET
BASOS ABS: 0.1 10*3/uL (ref 0.0–0.1)
Basophils Relative: 0.8 % (ref 0.0–3.0)
EOS ABS: 0.2 10*3/uL (ref 0.0–0.7)
Eosinophils Relative: 3.3 % (ref 0.0–5.0)
HEMATOCRIT: 33.3 % — AB (ref 39.0–52.0)
HEMOGLOBIN: 11.1 g/dL — AB (ref 13.0–17.0)
LYMPHS PCT: 30.3 % (ref 12.0–46.0)
Lymphs Abs: 2.1 10*3/uL (ref 0.7–4.0)
MCHC: 33.5 g/dL (ref 30.0–36.0)
MCV: 91.5 fl (ref 78.0–100.0)
MONOS PCT: 9.9 % (ref 3.0–12.0)
Monocytes Absolute: 0.7 10*3/uL (ref 0.1–1.0)
Neutro Abs: 3.9 10*3/uL (ref 1.4–7.7)
Neutrophils Relative %: 55.7 % (ref 43.0–77.0)
Platelets: 182 10*3/uL (ref 150.0–400.0)
RBC: 3.64 Mil/uL — AB (ref 4.22–5.81)
RDW: 15.1 % (ref 11.5–15.5)
WBC: 7 10*3/uL (ref 4.0–10.5)

## 2016-07-26 LAB — IBC PANEL
IRON: 69 ug/dL (ref 42–165)
Saturation Ratios: 23.1 % (ref 20.0–50.0)
TRANSFERRIN: 213 mg/dL (ref 212.0–360.0)

## 2016-07-26 LAB — MICROALBUMIN / CREATININE URINE RATIO
CREATININE, U: 189.1 mg/dL
MICROALB/CREAT RATIO: 0.4 mg/g (ref 0.0–30.0)

## 2016-07-26 LAB — BASIC METABOLIC PANEL
BUN: 33 mg/dL — ABNORMAL HIGH (ref 6–23)
CALCIUM: 8.9 mg/dL (ref 8.4–10.5)
CO2: 30 meq/L (ref 19–32)
CREATININE: 1.27 mg/dL (ref 0.40–1.50)
Chloride: 102 mEq/L (ref 96–112)
GFR: 70.27 mL/min (ref >60.00)
Glucose, Bld: 108 mg/dL — ABNORMAL HIGH (ref 70–99)
Potassium: 3.7 mEq/L (ref 3.5–5.1)
SODIUM: 137 meq/L (ref 135–145)

## 2016-07-26 LAB — URINALYSIS, ROUTINE W REFLEX MICROSCOPIC
Bilirubin Urine: NEGATIVE
Hgb urine dipstick: NEGATIVE
Ketones, ur: NEGATIVE
Leukocytes, UA: NEGATIVE
Nitrite: NEGATIVE
RBC / HPF: NONE SEEN (ref 0–?)
SPECIFIC GRAVITY, URINE: 1.02 (ref 1.000–1.030)
TOTAL PROTEIN, URINE-UPE24: NEGATIVE
URINE GLUCOSE: NEGATIVE
UROBILINOGEN UA: 0.2 (ref 0.0–1.0)
pH: 6 (ref 5.0–8.0)

## 2016-07-26 LAB — HEMOGLOBIN A1C: HEMOGLOBIN A1C: 6 % (ref 4.6–6.5)

## 2016-07-26 LAB — TSH: TSH: 2 u[IU]/mL (ref 0.35–4.50)

## 2016-07-26 NOTE — Patient Instructions (Addendum)
blood tests are requested for you today.  We'll let you know about the results.  Please come back for a "medicare wellness" appointment in 2 months.

## 2016-07-26 NOTE — Progress Notes (Signed)
Subjective:    Patient ID: Joseph Rocha, male    DOB: 1934/12/13, 81 y.o.   MRN: [id number]  HPI The state of at least three ongoing medical problems is addressed today, with interval history of each noted here: Hypothyroidism: he has gained weight Anemia: he denies LOC Dyslipidemia: he denies chest pain Past Medical History:  Diagnosis Date  . Allergy   . Anemia   . Diabetes mellitus   . Elevated PSA   . Hyperlipidemia   . Hypertension   . Hypogonadism male   . OA (osteoarthritis)   . 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 DOPPLER ECHOCARDIOGRAPHY  12/04/2001  . ELECTROCARDIOGRAM  03/25/2006  . EP IMPLANTABLE DEVICE N/A 03/16/2016   Procedure: Loop Recorder Insertion;  Surgeon: Deboraha Sprang, MD;  Location: [facility];  Service: Cardiovascular;  Laterality: N/A;  . KNEE ARTHROSCOPY Left   . LUMBAR DISC SURGERY    . MENISCUS REPAIR Right may 2015  . TONSILLECTOMY    . TRANSPHENOIDAL / TRANSNASAL HYPOPHYSECTOMY / RESECTION PITUITARY TUMOR  2005    Social History   Social History  . Marital status: Married    Spouse name: N/A  . Number of children: 2  . Years of education: N/A   Occupational History  . retired Retired   Social History Main Topics  . Smoking status: Former Smoker    Quit date: 02/22/1957  . Smokeless tobacco: Never Used  . Alcohol use No  . Drug use: No  . Sexual activity: Not Currently   Other Topics Concern  . Not on file   Social History Narrative   Lives with wife.  They have two grown children.   He is retired from the [employer].    Current Outpatient Prescriptions on File Prior to Visit  Medication Sig Dispense Refill  . aspirin 81 MG tablet Take 81 mg by mouth daily.    . calcium carbonate (OS-CAL) 600 MG TABS Take 600 mg by mouth daily.    . cetirizine-pseudoephedrine (ZYRTEC-D) 5-120 MG tablet Take 1 tablet by mouth daily.    . Cholecalciferol (VITAMIN D3 PO)  Take 600 mcg by mouth daily.     . finasteride (PROSCAR) 5 MG tablet Take 5 mg by mouth daily.      . fish oil-omega-3 fatty acids 1000 MG capsule Take 1 g by mouth daily.    . fludrocortisone (FLORINEF) 0.1 MG tablet Take 0.1 mg by mouth daily.    . folic acid (FOLVITE) 1 MG tablet Take 1 tablet (1 mg total) by mouth daily. 90 tablet 3  . KLOR-CON M20 20 MEQ tablet Take 20 mEq by mouth daily.     Marland Kitchen levothyroxine (SYNTHROID, LEVOTHROID) 50 MCG tablet Take 1 tablet (50 mcg total) by mouth daily. 90 tablet 3  . magnesium oxide (MAG-OX) 400 MG tablet Take 240 mg by mouth daily.    . meloxicam (MOBIC) 15 MG tablet Take 1 tablet (15 mg total) by mouth daily. 30 tablet 8  . methocarbamol (ROBAXIN) 500 MG tablet Take 500 mg by mouth at bedtime as needed for muscle spasms.    . Multiple Vitamins-Minerals (CENTRUM SILVER PO) Take 1 tablet by mouth daily.      Marland Kitchen omeprazole (PRILOSEC) 40 MG capsule Take 1 capsule (40 mg total) by mouth daily. 30 capsule 11  . pioglitazone (ACTOS) 45 MG tablet Take 45 mg by mouth daily.    Marland Kitchen  polyethylene glycol (MIRALAX / GLYCOLAX) packet Take 17 g by mouth every other day. Mix with water    . rosuvastatin (CRESTOR) 5 MG tablet Take 1 tablet (5 mg total) by mouth daily. 90 tablet 1  . Tamsulosin HCl (FLOMAX) 0.4 MG CAPS Take 0.4 mg by mouth daily.      . Testosterone (AXIRON) 30 MG/ACT SOLN Place 1 each onto the skin daily.      No current facility-administered medications on file prior to visit.     Allergies  Allergen Reactions  . Lipitor [Atorvastatin] Other (See Comments)    Myalgias, cramps in hand    Family History  Problem Relation Age of Onset  . Cancer Brother     uncertain type  . Diabetes Mellitus II Mother     Deceased, 46s  . Heart attack Father   . Healthy Daughter     BP 117/62   Pulse (!) 55   Ht 6\' 3"  (1.905 m)   Wt 232 lb (105.2 kg)   BMI 29.00 kg/m    Review of Systems Denies dizziness and sob.     Objective:   Physical  Exam VITAL SIGNS:  See vs page GENERAL: no distress Pulses: dorsalis pedis intact bilat.   MSK: no deformity of the feet CV: no leg edema Skin:  no ulcer on the feet.  normal color and temp on the feet. Neuro: sensation is intact to touch on the feet   Lab Results  Component Value Date   WBC 7.0 07/26/2016   HGB 11.1 (L) 07/26/2016   HCT 33.3 (L) 07/26/2016   PLT 182.0 07/26/2016   GLUCOSE 108 (H) 07/26/2016   CHOL 171 07/26/2016   TRIG 85.0 07/26/2016   HDL 41.50 07/26/2016   LDLCALC 112 (H) 07/26/2016   ALT 17 07/26/2016   AST 24 07/26/2016   NA 137 07/26/2016   K 3.7 07/26/2016   CL 102 07/26/2016   CREATININE 1.27 07/26/2016   BUN 33 (H) 07/26/2016   CO2 30 07/26/2016   TSH 2.00 07/26/2016   PSA 3.04 07/07/2014   HGBA1C 6.0 07/26/2016   MICROALBUR <0.7 07/26/2016      Assessment & Plan:  Anemia: persistent.  We'll follow.  Dyslipidemia: improved.  Same rx.  Hypothyroidism: well-replaced.  Same synthroid.  DM: well-controlled.

## 2016-07-27 ENCOUNTER — Ambulatory Visit: Payer: Medicare Other | Admitting: Endocrinology

## 2016-07-30 ENCOUNTER — Other Ambulatory Visit: Payer: Self-pay

## 2016-07-30 MED ORDER — PIOGLITAZONE HCL 45 MG PO TABS: 45.0000 mg | ORAL_TABLET | Freq: Every day | ORAL | 2 refills | Status: DC

## 2016-07-30 MED ORDER — CETIRIZINE-PSEUDOEPHEDRINE ER 5-120 MG PO TB12: 1.0000 | ORAL_TABLET | Freq: Every day | ORAL | 2 refills | Status: DC

## 2016-07-30 MED ORDER — OMEPRAZOLE 40 MG PO CPDR: 40.0000 mg | DELAYED_RELEASE_CAPSULE | Freq: Every day | ORAL | 2 refills | Status: DC

## 2016-07-30 MED ORDER — ROSUVASTATIN CALCIUM 5 MG PO TABS: 5.0000 mg | ORAL_TABLET | Freq: Every day | ORAL | 1 refills | Status: DC

## 2016-07-30 MED ORDER — FOLIC ACID 1 MG PO TABS: 1.0000 mg | ORAL_TABLET | Freq: Every day | ORAL | 2 refills | Status: DC

## 2016-07-30 MED ORDER — FLUDROCORTISONE ACETATE 0.1 MG PO TABS: 0.1000 mg | ORAL_TABLET | Freq: Every day | ORAL | 2 refills | Status: DC

## 2016-07-30 MED ORDER — POLYETHYLENE GLYCOL 3350 17 G PO PACK: 17.0000 g | PACK | ORAL | 2 refills | Status: DC

## 2016-07-30 MED ORDER — LEVOTHYROXINE SODIUM 50 MCG PO TABS: 50.0000 ug | ORAL_TABLET | Freq: Every day | ORAL | 2 refills | Status: DC

## 2016-07-30 MED ORDER — METHOCARBAMOL 500 MG PO TABS: 500.0000 mg | ORAL_TABLET | Freq: Every evening | ORAL | 2 refills | Status: DC | PRN

## 2016-07-30 MED ORDER — MAGNESIUM OXIDE 400 MG PO TABS: 240.0000 mg | ORAL_TABLET | Freq: Every day | ORAL | 2 refills | Status: DC

## 2016-07-30 MED ORDER — MELOXICAM 15 MG PO TABS: 15.0000 mg | ORAL_TABLET | Freq: Every day | ORAL | 2 refills | Status: DC

## 2016-08-01 ENCOUNTER — Telehealth: Payer: Self-pay | Admitting: Endocrinology

## 2016-08-01 DIAGNOSIS — J301 Allergic rhinitis due to pollen: Secondary | ICD-10-CM | POA: Diagnosis not present

## 2016-08-01 DIAGNOSIS — J3089 Other allergic rhinitis: Secondary | ICD-10-CM | POA: Diagnosis not present

## 2016-08-01 NOTE — Telephone Encounter (Signed)
Pt is requesting a call back thank you

## 2016-08-02 MED ORDER — CETIRIZINE-PSEUDOEPHEDRINE ER 5-120 MG PO TB12
1.0000 | ORAL_TABLET | Freq: Every day | ORAL | 2 refills | Status: DC
Start: 2016-08-02 — End: 2017-05-09

## 2016-08-02 MED ORDER — FOLIC ACID 1 MG PO TABS: 1.0000 mg | ORAL_TABLET | Freq: Every day | ORAL | 2 refills | Status: DC

## 2016-08-02 MED ORDER — MAGNESIUM OXIDE 400 MG PO TABS: 240.0000 mg | ORAL_TABLET | Freq: Every day | ORAL | 2 refills | Status: DC

## 2016-08-02 MED ORDER — PIOGLITAZONE HCL 45 MG PO TABS: 45.0000 mg | ORAL_TABLET | Freq: Every day | ORAL | 2 refills | Status: DC

## 2016-08-02 MED ORDER — ROSUVASTATIN CALCIUM 5 MG PO TABS: 5.0000 mg | ORAL_TABLET | Freq: Every day | ORAL | 1 refills | Status: DC

## 2016-08-02 MED ORDER — MELOXICAM 15 MG PO TABS
15.0000 mg | ORAL_TABLET | Freq: Every day | ORAL | 2 refills | Status: DC
Start: 2016-08-02 — End: 2018-01-15

## 2016-08-02 MED ORDER — FLUDROCORTISONE ACETATE 0.1 MG PO TABS: 0.1000 mg | ORAL_TABLET | Freq: Every day | ORAL | 2 refills | Status: DC

## 2016-08-02 MED ORDER — POLYETHYLENE GLYCOL 3350 17 G PO PACK: 17.0000 g | PACK | ORAL | 2 refills | Status: DC

## 2016-08-02 MED ORDER — OMEPRAZOLE 40 MG PO CPDR: 40.0000 mg | DELAYED_RELEASE_CAPSULE | Freq: Every day | ORAL | 2 refills | Status: DC

## 2016-08-02 MED ORDER — LEVOTHYROXINE SODIUM 50 MCG PO TABS: 50.0000 ug | ORAL_TABLET | Freq: Every day | ORAL | 2 refills | Status: DC

## 2016-08-02 MED ORDER — METHOCARBAMOL 500 MG PO TABS: 500.0000 mg | ORAL_TABLET | Freq: Every evening | ORAL | 2 refills | Status: DC | PRN

## 2016-08-02 NOTE — Telephone Encounter (Signed)
I contacted the patient and he requested we contact [facility] and cancel the refills for the levothyroxine and methocarbamol and send a e-script to [facility]. Patient advised we have done has requested. Patient voiced understanding and had no further questions.

## 2016-08-02 NOTE — Addendum Note (Signed)
Addended by: Verlin Grills T on: 08/02/2016 11:32 AM   Modules accepted: Orders

## 2016-08-09 DIAGNOSIS — J3089 Other allergic rhinitis: Secondary | ICD-10-CM | POA: Diagnosis not present

## 2016-08-09 DIAGNOSIS — J301 Allergic rhinitis due to pollen: Secondary | ICD-10-CM | POA: Diagnosis not present

## 2016-08-11 LAB — CUP PACEART REMOTE DEVICE CHECK: Date Time Interrogation Session: 20170909120703

## 2016-08-11 NOTE — Progress Notes (Signed)
Carelink summary report received. Battery status OK. Normal device function. No new symptom episodes, tachy episodes, brady, or pause episodes. No new AF episodes. Monthly summary reports and ROV/PRN 

## 2016-08-13 ENCOUNTER — Ambulatory Visit (INDEPENDENT_AMBULATORY_CARE_PROVIDER_SITE_OTHER): Payer: Medicare Other | Admitting: *Deleted

## 2016-08-13 DIAGNOSIS — R55 Syncope and collapse: Secondary | ICD-10-CM | POA: Diagnosis not present

## 2016-08-13 NOTE — Progress Notes (Signed)
[facility] Summary Report / Loop Recorder 

## 2016-08-15 ENCOUNTER — Encounter: Payer: Self-pay | Admitting: Endocrinology

## 2016-08-15 ENCOUNTER — Ambulatory Visit (INDEPENDENT_AMBULATORY_CARE_PROVIDER_SITE_OTHER): Payer: Medicare Other | Admitting: Endocrinology

## 2016-08-15 VITALS — BP 122/74 | HR 59 | Temp 97.7°F | Ht 75.0 in | Wt 230.0 lb

## 2016-08-15 DIAGNOSIS — R112 Nausea with vomiting, unspecified: Secondary | ICD-10-CM

## 2016-08-15 DIAGNOSIS — Z23 Encounter for immunization: Secondary | ICD-10-CM | POA: Diagnosis not present

## 2016-08-15 DIAGNOSIS — J301 Allergic rhinitis due to pollen: Secondary | ICD-10-CM | POA: Diagnosis not present

## 2016-08-15 DIAGNOSIS — E038 Other specified hypothyroidism: Secondary | ICD-10-CM | POA: Diagnosis not present

## 2016-08-15 DIAGNOSIS — J3089 Other allergic rhinitis: Secondary | ICD-10-CM | POA: Diagnosis not present

## 2016-08-15 MED ORDER — ONDANSETRON HCL 4 MG PO TABS: 4.0000 mg | ORAL_TABLET | Freq: Three times a day (TID) | ORAL | 0 refills | Status: DC | PRN

## 2016-08-15 NOTE — Patient Instructions (Addendum)
blood tests are requested for you today.  We'll let you know about the results.   I have sent a prescription to your pharmacy, for the nausea.  I hope you feel better soon.  If you don't feel better by next week, please call back.  Please call sooner if you get worse.  I'll see you next time.            Viral Gastroenteritis Viral gastroenteritis is also called stomach flu. This illness is caused by a certain type of germ (virus). It can cause sudden watery poop (diarrhea) and throwing up (vomiting). This can cause you to lose body fluids (dehydration). This illness usually lasts for 3 to 8 days. It usually goes away on its own. HOME CARE   Drink enough fluids to keep your pee (urine) clear or pale yellow. Drink small amounts of fluids often.  Ask your doctor how to replace body fluid losses (rehydration).  Avoid:  Foods high in sugar.  Alcohol.  Bubbly (carbonated) drinks.  Tobacco.  Juice.  Caffeine drinks.  Very hot or cold fluids.  Fatty, greasy foods.  Eating too much at one time.  Dairy products until 24 to 48 hours after your watery poop stops.  You may eat foods with active cultures (probiotics). They can be found in some yogurts and supplements.  Wash your hands well to avoid spreading the illness.  Only take medicines as told by your doctor. Do not give aspirin to children. Do not take medicines for watery poop (antidiarrheals).  Ask your doctor if you should keep taking your regular medicines.  Keep all doctor visits as told. GET HELP RIGHT AWAY IF:   You cannot keep fluids down.  You do not pee at least once every 6 to 8 hours.  You are short of breath.  You see blood in your poop or throw up. This may look like coffee grounds.  You have belly (abdominal) pain that gets worse or is just in one small spot (localized).  You keep throwing up or having watery poop.  You have a fever.  The patient is a child younger than 3 months, and he or  she has a fever.  The patient is a child older than 3 months, and he or she has a fever and problems that do not go away.  The patient is a child older than 3 months, and he or she has a fever and problems that suddenly get worse.  The patient is a baby, and he or she has no tears when crying. MAKE SURE YOU:   Understand these instructions.  Will watch your condition.  Will get help right away if you are not doing well or get worse.   This information is not intended to replace advice given to you by your health care provider. Make sure you discuss any questions you have with your health care provider.   Document Released: 04/09/2008 Document Revised: 01/14/2012 Document Reviewed: 08/08/2011 Elsevier Interactive Patient Education [employer].

## 2016-08-15 NOTE — Progress Notes (Signed)
Subjective:    Patient ID: Joseph Rocha, male    DOB: 1935-10-31, 81 y.o.   MRN: [id number]  HPI Pt states few days of moderate myalgias throughout the body, and assoc n/v/d.   Past Medical History:  Diagnosis Date  . Allergy   . Anemia   . Diabetes mellitus   . Elevated PSA   . Hyperlipidemia   . Hypertension   . Hypogonadism male   . OA (osteoarthritis)   . 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 DOPPLER ECHOCARDIOGRAPHY  12/04/2001  . ELECTROCARDIOGRAM  03/25/2006  . EP IMPLANTABLE DEVICE N/A 03/16/2016   Procedure: Loop Recorder Insertion;  Surgeon: Deboraha Sprang, MD;  Location: [facility];  Service: Cardiovascular;  Laterality: N/A;  . KNEE ARTHROSCOPY Left   . LUMBAR DISC SURGERY    . MENISCUS REPAIR Right may 2015  . TONSILLECTOMY    . TRANSPHENOIDAL / TRANSNASAL HYPOPHYSECTOMY / RESECTION PITUITARY TUMOR  2005    Social History   Social History  . Marital status: Married    Spouse name: N/A  . Number of children: 2  . Years of education: N/A   Occupational History  . retired Retired   Social History Main Topics  . Smoking status: Former Smoker    Quit date: 02/22/1957  . Smokeless tobacco: Never Used  . Alcohol use No  . Drug use: No  . Sexual activity: Not Currently   Other Topics Concern  . Not on file   Social History Narrative   Lives with wife.  They have two grown children.   He is retired from the [employer].    Current Outpatient Prescriptions on File Prior to Visit  Medication Sig Dispense Refill  . aspirin 81 MG tablet Take 81 mg by mouth daily.    . calcium carbonate (OS-CAL) 600 MG TABS Take 600 mg by mouth daily.    . cetirizine-pseudoephedrine (ZYRTEC-D) 5-120 MG tablet Take 1 tablet by mouth daily. 90 tablet 2  . Cholecalciferol (VITAMIN D3 PO) Take 600 mcg by mouth daily.     . finasteride (PROSCAR) 5 MG tablet Take 5 mg by mouth daily.      . fish oil-omega-3  fatty acids 1000 MG capsule Take 1 g by mouth daily.    . fludrocortisone (FLORINEF) 0.1 MG tablet Take 1 tablet (0.1 mg total) by mouth daily. 90 tablet 2  . folic acid (FOLVITE) 1 MG tablet Take 1 tablet (1 mg total) by mouth daily. 90 tablet 2  . KLOR-CON M20 20 MEQ tablet Take 20 mEq by mouth daily.     Marland Kitchen levothyroxine (SYNTHROID, LEVOTHROID) 50 MCG tablet Take 1 tablet (50 mcg total) by mouth daily. 90 tablet 2  . magnesium oxide (MAG-OX) 400 MG tablet Take 0.5 tablets (200 mg total) by mouth daily. 45 tablet 2  . meloxicam (MOBIC) 15 MG tablet Take 1 tablet (15 mg total) by mouth daily. 90 tablet 2  . methocarbamol (ROBAXIN) 500 MG tablet Take 1 tablet (500 mg total) by mouth at bedtime as needed for muscle spasms. 90 tablet 2  . Multiple Vitamins-Minerals (CENTRUM SILVER PO) Take 1 tablet by mouth daily.      Marland Kitchen omeprazole (PRILOSEC) 40 MG capsule Take 1 capsule (40 mg total) by mouth daily. 90 capsule 2  . pioglitazone (ACTOS) 45 MG tablet Take 1 tablet (45 mg total) by mouth daily. 90 tablet 2  . polyethylene  glycol (MIRALAX / GLYCOLAX) packet Take 17 g by mouth every other day. Mix with water 14 each 2  . rosuvastatin (CRESTOR) 5 MG tablet Take 1 tablet (5 mg total) by mouth daily. 90 tablet 1  . Tamsulosin HCl (FLOMAX) 0.4 MG CAPS Take 0.4 mg by mouth daily.      . Testosterone (AXIRON) 30 MG/ACT SOLN Place 1 each onto the skin daily.      No current facility-administered medications on file prior to visit.     Allergies  Allergen Reactions  . Lipitor [Atorvastatin] Other (See Comments)    Myalgias, cramps in hand    Family History  Problem Relation Age of Onset  . Cancer Brother     uncertain type  . Diabetes Mellitus II Mother     Deceased, 42s  . Heart attack Father   . Healthy Daughter     BP 122/74   Pulse (!) 59   Temp 97.7 F (36.5 C) (Oral)   Ht 6\' 3"  (1.905 m)   Wt 230 lb (104.3 kg)   SpO2 98%   BMI 28.75 kg/m    Review of Systems Denies fever and  LOC.  He has gained weight.      Objective:   Physical Exam VITAL SIGNS:  See vs page.   GENERAL: no distress.   ABDOMEN: abdomen is soft, nontender.  no hepatosplenomegaly.  not distended.  no hernia.       Assessment & Plan:  Viral GE, new Hypotension: well-controlled.  Please continue the same florinef Weight gain, new.  Recheck TSH Patient is advised the following: Patient Instructions  blood tests are requested for you today.  We'll let you know about the results.   I have sent a prescription to your pharmacy, for the nausea.  I hope you feel better soon.  If you don't feel better by next week, please call back.  Please call sooner if you get worse.  I'll see you next time.            Viral Gastroenteritis Viral gastroenteritis is also called stomach flu. This illness is caused by a certain type of germ (virus). It can cause sudden watery poop (diarrhea) and throwing up (vomiting). This can cause you to lose body fluids (dehydration). This illness usually lasts for 3 to 8 days. It usually goes away on its own. HOME CARE   Drink enough fluids to keep your pee (urine) clear or pale yellow. Drink small amounts of fluids often.  Ask your doctor how to replace body fluid losses (rehydration).  Avoid:  Foods high in sugar.  Alcohol.  Bubbly (carbonated) drinks.  Tobacco.  Juice.  Caffeine drinks.  Very hot or cold fluids.  Fatty, greasy foods.  Eating too much at one time.  Dairy products until 24 to 48 hours after your watery poop stops.  You may eat foods with active cultures (probiotics). They can be found in some yogurts and supplements.  Wash your hands well to avoid spreading the illness.  Only take medicines as told by your doctor. Do not give aspirin to children. Do not take medicines for watery poop (antidiarrheals).  Ask your doctor if you should keep taking your regular medicines.  Keep all doctor visits as told. GET HELP RIGHT AWAY IF:    You cannot keep fluids down.  You do not pee at least once every 6 to 8 hours.  You are short of breath.  You see blood in your poop or  throw up. This may look like coffee grounds.  You have belly (abdominal) pain that gets worse or is just in one small spot (localized).  You keep throwing up or having watery poop.  You have a fever.  The patient is a child younger than 3 months, and he or she has a fever.  The patient is a child older than 3 months, and he or she has a fever and problems that do not go away.  The patient is a child older than 3 months, and he or she has a fever and problems that suddenly get worse.  The patient is a baby, and he or she has no tears when crying. MAKE SURE YOU:   Understand these instructions.  Will watch your condition.  Will get help right away if you are not doing well or get worse.   This information is not intended to replace advice given to you by your health care provider. Make sure you discuss any questions you have with your health care provider.   Document Released: 04/09/2008 Document Revised: 01/14/2012 Document Reviewed: 08/08/2011 Elsevier Interactive Patient Education [employer].

## 2016-08-16 LAB — BASIC METABOLIC PANEL
BUN: 28 mg/dL — AB (ref 6–23)
CHLORIDE: 102 meq/L (ref 96–112)
CO2: 28 meq/L (ref 19–32)
Calcium: 9.3 mg/dL (ref 8.4–10.5)
Creatinine, Ser: 1.36 mg/dL (ref 0.40–1.50)
GFR: 64.92 mL/min (ref >60.00)
GLUCOSE: 100 mg/dL — AB (ref 70–99)
POTASSIUM: 3.9 meq/L (ref 3.5–5.1)
Sodium: 136 mEq/L (ref 135–145)

## 2016-08-16 LAB — TSH: TSH: 1.65 u[IU]/mL (ref 0.35–4.50)

## 2016-08-22 DIAGNOSIS — J3089 Other allergic rhinitis: Secondary | ICD-10-CM | POA: Diagnosis not present

## 2016-08-22 DIAGNOSIS — M4802 Spinal stenosis, cervical region: Secondary | ICD-10-CM | POA: Diagnosis not present

## 2016-08-22 DIAGNOSIS — M5412 Radiculopathy, cervical region: Secondary | ICD-10-CM | POA: Diagnosis not present

## 2016-08-22 DIAGNOSIS — Z6829 Body mass index (BMI) 29.0-29.9, adult: Secondary | ICD-10-CM | POA: Diagnosis not present

## 2016-08-22 DIAGNOSIS — J301 Allergic rhinitis due to pollen: Secondary | ICD-10-CM | POA: Diagnosis not present

## 2016-08-22 DIAGNOSIS — M542 Cervicalgia: Secondary | ICD-10-CM | POA: Diagnosis not present

## 2016-08-25 ENCOUNTER — Other Ambulatory Visit: Payer: Self-pay | Admitting: Neurosurgery

## 2016-08-25 DIAGNOSIS — M5412 Radiculopathy, cervical region: Secondary | ICD-10-CM

## 2016-08-29 DIAGNOSIS — J3089 Other allergic rhinitis: Secondary | ICD-10-CM | POA: Diagnosis not present

## 2016-08-29 DIAGNOSIS — J301 Allergic rhinitis due to pollen: Secondary | ICD-10-CM | POA: Diagnosis not present

## 2016-09-05 DIAGNOSIS — J301 Allergic rhinitis due to pollen: Secondary | ICD-10-CM | POA: Diagnosis not present

## 2016-09-05 DIAGNOSIS — J3089 Other allergic rhinitis: Secondary | ICD-10-CM | POA: Diagnosis not present

## 2016-09-07 ENCOUNTER — Ambulatory Visit
Admission: RE | Admit: 2016-09-07 | Discharge: 2016-09-07 | Disposition: A | Discharge status: Home / Self Care | Payer: Medicare Other | Source: Ambulatory Visit | Attending: Neurosurgery | Admitting: Neurosurgery

## 2016-09-07 DIAGNOSIS — M5412 Radiculopathy, cervical region: Secondary | ICD-10-CM

## 2016-09-07 DIAGNOSIS — M4802 Spinal stenosis, cervical region: Secondary | ICD-10-CM | POA: Diagnosis not present

## 2016-09-11 DIAGNOSIS — M4802 Spinal stenosis, cervical region: Secondary | ICD-10-CM | POA: Diagnosis not present

## 2016-09-11 DIAGNOSIS — M542 Cervicalgia: Secondary | ICD-10-CM | POA: Diagnosis not present

## 2016-09-11 DIAGNOSIS — Z6829 Body mass index (BMI) 29.0-29.9, adult: Secondary | ICD-10-CM | POA: Diagnosis not present

## 2016-09-11 DIAGNOSIS — M5412 Radiculopathy, cervical region: Secondary | ICD-10-CM | POA: Diagnosis not present

## 2016-09-12 ENCOUNTER — Ambulatory Visit (INDEPENDENT_AMBULATORY_CARE_PROVIDER_SITE_OTHER): Payer: Medicare Other | Admitting: *Deleted

## 2016-09-12 DIAGNOSIS — J301 Allergic rhinitis due to pollen: Secondary | ICD-10-CM | POA: Diagnosis not present

## 2016-09-12 DIAGNOSIS — R55 Syncope and collapse: Secondary | ICD-10-CM

## 2016-09-12 DIAGNOSIS — J3089 Other allergic rhinitis: Secondary | ICD-10-CM | POA: Diagnosis not present

## 2016-09-12 NOTE — Progress Notes (Signed)
[facility] Summary Report / Loop Recorder 

## 2016-09-16 LAB — CUP PACEART REMOTE DEVICE CHECK
Date Time Interrogation Session: 20171009123949
MDC IDC PG IMPLANT DT: 20170512

## 2016-09-16 NOTE — Progress Notes (Signed)
Carelink summary report received. Battery status OK. Normal device function. No new symptom episodes, tachy episodes, brady, or pause episodes. No new AF episodes. Monthly summary reports and ROV/PRN 

## 2016-09-25 ENCOUNTER — Ambulatory Visit: Payer: Medicare Other | Admitting: Endocrinology

## 2016-10-03 ENCOUNTER — Ambulatory Visit (INDEPENDENT_AMBULATORY_CARE_PROVIDER_SITE_OTHER): Payer: Medicare Other | Admitting: Endocrinology

## 2016-10-03 ENCOUNTER — Ambulatory Visit (HOSPITAL_COMMUNITY)
Admission: RE | Admit: 2016-10-03 | Discharge: 2016-10-03 | Disposition: A | Discharge status: Home / Self Care | Payer: Medicare Other | Source: Ambulatory Visit | Attending: Endocrinology | Admitting: Endocrinology

## 2016-10-03 ENCOUNTER — Encounter: Payer: Self-pay | Admitting: Endocrinology

## 2016-10-03 VITALS — BP 126/74 | HR 66 | Ht 75.0 in | Wt 235.0 lb

## 2016-10-03 DIAGNOSIS — R600 Localized edema: Secondary | ICD-10-CM

## 2016-10-03 DIAGNOSIS — E119 Type 2 diabetes mellitus without complications: Secondary | ICD-10-CM | POA: Diagnosis not present

## 2016-10-03 DIAGNOSIS — R609 Edema, unspecified: Secondary | ICD-10-CM | POA: Insufficient documentation

## 2016-10-03 DIAGNOSIS — J301 Allergic rhinitis due to pollen: Secondary | ICD-10-CM | POA: Diagnosis not present

## 2016-10-03 DIAGNOSIS — R0600 Dyspnea, unspecified: Secondary | ICD-10-CM | POA: Diagnosis not present

## 2016-10-03 DIAGNOSIS — J3089 Other allergic rhinitis: Secondary | ICD-10-CM | POA: Diagnosis not present

## 2016-10-03 LAB — BASIC METABOLIC PANEL
BUN: 28 mg/dL — AB (ref 6–23)
CHLORIDE: 104 meq/L (ref 96–112)
CO2: 30 mEq/L (ref 19–32)
CREATININE: 1.42 mg/dL (ref 0.40–1.50)
Calcium: 9.1 mg/dL (ref 8.4–10.5)
GFR: 61.74 mL/min (ref >60.00)
Glucose, Bld: 93 mg/dL (ref 70–99)
POTASSIUM: 3.6 meq/L (ref 3.5–5.1)
Sodium: 140 mEq/L (ref 135–145)

## 2016-10-03 LAB — URINALYSIS, ROUTINE W REFLEX MICROSCOPIC
BILIRUBIN URINE: NEGATIVE
Hgb urine dipstick: NEGATIVE
Ketones, ur: NEGATIVE
LEUKOCYTES UA: NEGATIVE
Nitrite: NEGATIVE
PH: 6 (ref 5.0–8.0)
SPECIFIC GRAVITY, URINE: 1.02 (ref 1.000–1.030)
TOTAL PROTEIN, URINE-UPE24: NEGATIVE
Urine Glucose: NEGATIVE
Urobilinogen, UA: 0.2 (ref 0.0–1.0)

## 2016-10-03 LAB — BRAIN NATRIURETIC PEPTIDE: PRO B NATRI PEPTIDE: 51 pg/mL (ref 0.0–100.0)

## 2016-10-03 LAB — POCT GLYCOSYLATED HEMOGLOBIN (HGB A1C): HEMOGLOBIN A1C: 5.7

## 2016-10-03 NOTE — Progress Notes (Signed)
VASCULAR LAB PRELIMINARY  PRELIMINARY  PRELIMINARY  PRELIMINARY  Bilateral lower extremity venous duplex completed.    Preliminary report:  Bilateral:  No evidence of DVT, superficial thrombosis, or Baker's Cyst.   Joseph Rocha, RVS 10/03/2016, 1:57 PM

## 2016-10-03 NOTE — Patient Instructions (Signed)
Blood and urine tests are requested for you today.  We'll let you know about the results.   Let's check an ultrasound of the legs, to check for blood clots.  You will hear that result right away.

## 2016-10-03 NOTE — Progress Notes (Signed)
Subjective:    Patient ID: Joseph Rocha, male    DOB: 16-Dec-1934, 81 y.o.   MRN: [id number]  HPI Pt states 1 week of moderate swelling of the legs, and assoc pain.  He recently returned from a cruise.   Past Medical History:  Diagnosis Date  . Allergy   . Anemia   . Diabetes mellitus   . Elevated PSA   . Hyperlipidemia   . Hypertension   . Hypogonadism male   . OA (osteoarthritis)   . 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 DOPPLER ECHOCARDIOGRAPHY  12/04/2001  . ELECTROCARDIOGRAM  03/25/2006  . EP IMPLANTABLE DEVICE N/A 03/16/2016   Procedure: Loop Recorder Insertion;  Surgeon: Deboraha Sprang, MD;  Location: [facility];  Service: Cardiovascular;  Laterality: N/A;  . KNEE ARTHROSCOPY Left   . LUMBAR DISC SURGERY    . MENISCUS REPAIR Right may 2015  . TONSILLECTOMY    . TRANSPHENOIDAL / TRANSNASAL HYPOPHYSECTOMY / RESECTION PITUITARY TUMOR  2005    Social History   Social History  . Marital status: Married    Spouse name: N/A  . Number of children: 2  . Years of education: N/A   Occupational History  . retired Retired   Social History Main Topics  . Smoking status: Former Smoker    Quit date: 02/22/1957  . Smokeless tobacco: Never Used  . Alcohol use No  . Drug use: No  . Sexual activity: Not Currently   Other Topics Concern  . Not on file   Social History Narrative   Lives with wife.  They have two grown children.   He is retired from the [employer].    Current Outpatient Prescriptions on File Prior to Visit  Medication Sig Dispense Refill  . calcium carbonate (OS-CAL) 600 MG TABS Take 600 mg by mouth daily.    . cetirizine-pseudoephedrine (ZYRTEC-D) 5-120 MG tablet Take 1 tablet by mouth daily. 90 tablet 2  . Cholecalciferol (VITAMIN D3 PO) Take 600 mcg by mouth daily.     . finasteride (PROSCAR) 5 MG tablet Take 5 mg by mouth daily.      . fish oil-omega-3 fatty acids 1000 MG capsule Take  1 g by mouth daily.    . fludrocortisone (FLORINEF) 0.1 MG tablet Take 1 tablet (0.1 mg total) by mouth daily. 90 tablet 2  . folic acid (FOLVITE) 1 MG tablet Take 1 tablet (1 mg total) by mouth daily. 90 tablet 2  . KLOR-CON M20 20 MEQ tablet Take 20 mEq by mouth daily.     Marland Kitchen levothyroxine (SYNTHROID, LEVOTHROID) 50 MCG tablet Take 1 tablet (50 mcg total) by mouth daily. 90 tablet 2  . magnesium oxide (MAG-OX) 400 MG tablet Take 0.5 tablets (200 mg total) by mouth daily. 45 tablet 2  . meloxicam (MOBIC) 15 MG tablet Take 1 tablet (15 mg total) by mouth daily. 90 tablet 2  . methocarbamol (ROBAXIN) 500 MG tablet Take 1 tablet (500 mg total) by mouth at bedtime as needed for muscle spasms. 90 tablet 2  . Multiple Vitamins-Minerals (CENTRUM SILVER PO) Take 1 tablet by mouth daily.      Marland Kitchen omeprazole (PRILOSEC) 40 MG capsule Take 1 capsule (40 mg total) by mouth daily. 90 capsule 2  . ondansetron (ZOFRAN) 4 MG tablet Take 1 tablet (4 mg total) by mouth every 8 (eight) hours as needed for nausea or vomiting. 20 tablet 0  .  polyethylene glycol (MIRALAX / GLYCOLAX) packet Take 17 g by mouth every other day. Mix with water 14 each 2  . rosuvastatin (CRESTOR) 5 MG tablet Take 1 tablet (5 mg total) by mouth daily. 90 tablet 1  . Tamsulosin HCl (FLOMAX) 0.4 MG CAPS Take 0.4 mg by mouth daily.      . Testosterone (AXIRON) 30 MG/ACT SOLN Place 1 each onto the skin daily.      No current facility-administered medications on file prior to visit.     Allergies  Allergen Reactions  . Lipitor [Atorvastatin] Other (See Comments)    Myalgias, cramps in hand    Family History  Problem Relation Age of Onset  . Cancer Brother     uncertain type  . Diabetes Mellitus II Mother     Deceased, 27s  . Heart attack Father   . Healthy Daughter     BP 126/74   Pulse 66   Ht 6\' 3"  (1.905 m)   Wt 235 lb (106.6 kg)   SpO2 97%   BMI 29.37 kg/m    Review of Systems Denies chest pain.  He has slight doe.       Objective:   Physical Exam VITAL SIGNS:  See vs page GENERAL: no distress Pulses: dorsalis pedis intact bilat.   MSK: no deformity of the feet CV: 2+ bilat leg edema Skin:  no ulcer on the feet.  normal color and temp on the feet.  Neuro: sensation is intact to touch on the feet.    A1c=5.7%    Assessment & Plan:  Edema, new.  prob due to actos+recent travel.  D/c actos.  Check venous doppler Type 2 DM: a1c is so good, we don't have to replace actos now.

## 2016-10-04 MED ORDER — FUROSEMIDE 20 MG PO TABS: 20.0000 mg | ORAL_TABLET | Freq: Every day | ORAL | 3 refills | Status: DC

## 2016-10-05 ENCOUNTER — Telehealth: Payer: Self-pay | Admitting: Endocrinology

## 2016-10-05 ENCOUNTER — Other Ambulatory Visit: Payer: Self-pay

## 2016-10-05 MED ORDER — FUROSEMIDE 20 MG PO TABS: 20.0000 mg | ORAL_TABLET | Freq: Every day | ORAL | 3 refills | Status: DC

## 2016-10-05 NOTE — Telephone Encounter (Signed)
I contacted the patient and advised we have resubmitted the refill to [facility] on [address].

## 2016-10-05 NOTE — Telephone Encounter (Signed)
Patient state his prescription to reduce fluid in his ankles,pharmacy haven't received it, he didn't know the name of it. Please call him when it is ready.

## 2016-10-10 DIAGNOSIS — J301 Allergic rhinitis due to pollen: Secondary | ICD-10-CM | POA: Diagnosis not present

## 2016-10-10 DIAGNOSIS — J3089 Other allergic rhinitis: Secondary | ICD-10-CM | POA: Diagnosis not present

## 2016-10-12 ENCOUNTER — Ambulatory Visit (INDEPENDENT_AMBULATORY_CARE_PROVIDER_SITE_OTHER): Payer: Medicare Other | Admitting: *Deleted

## 2016-10-12 DIAGNOSIS — R55 Syncope and collapse: Secondary | ICD-10-CM | POA: Diagnosis not present

## 2016-10-15 NOTE — Progress Notes (Signed)
[facility] Summary Report / Loop Recorder 

## 2016-10-17 DIAGNOSIS — J301 Allergic rhinitis due to pollen: Secondary | ICD-10-CM | POA: Diagnosis not present

## 2016-10-17 DIAGNOSIS — J3089 Other allergic rhinitis: Secondary | ICD-10-CM | POA: Diagnosis not present

## 2016-10-23 DIAGNOSIS — J301 Allergic rhinitis due to pollen: Secondary | ICD-10-CM | POA: Diagnosis not present

## 2016-10-23 DIAGNOSIS — J3089 Other allergic rhinitis: Secondary | ICD-10-CM | POA: Diagnosis not present

## 2016-10-24 ENCOUNTER — Other Ambulatory Visit: Payer: Self-pay | Admitting: Gastroenterology

## 2016-10-27 LAB — CUP PACEART REMOTE DEVICE CHECK
MDC IDC PG IMPLANT DT: 20170512
MDC IDC SESS DTM: 20171108124131

## 2016-10-27 NOTE — Progress Notes (Signed)
Carelink summary report received. Battery status OK. Normal device function. No new symptom episodes, tachy episodes, brady, or pause episodes. No new AF episodes. Monthly summary reports and ROV/PRN 

## 2016-11-01 DIAGNOSIS — J301 Allergic rhinitis due to pollen: Secondary | ICD-10-CM | POA: Diagnosis not present

## 2016-11-01 DIAGNOSIS — J3089 Other allergic rhinitis: Secondary | ICD-10-CM | POA: Diagnosis not present

## 2016-11-06 ENCOUNTER — Telehealth: Payer: Self-pay | Admitting: Gastroenterology

## 2016-11-06 NOTE — Telephone Encounter (Signed)
Pt states he has been having issues with dark stools and decreased appetite. Requesting to be seen. Pt scheduled to see Tye Savoy NP 11/12/16@3pm . Pt aware of appt.

## 2016-11-07 DIAGNOSIS — J301 Allergic rhinitis due to pollen: Secondary | ICD-10-CM | POA: Diagnosis not present

## 2016-11-07 DIAGNOSIS — J3089 Other allergic rhinitis: Secondary | ICD-10-CM | POA: Diagnosis not present

## 2016-11-12 ENCOUNTER — Ambulatory Visit: Payer: Medicare Other | Admitting: Nurse Practitioner

## 2016-11-12 ENCOUNTER — Ambulatory Visit (INDEPENDENT_AMBULATORY_CARE_PROVIDER_SITE_OTHER): Payer: Medicare Other | Admitting: *Deleted

## 2016-11-12 DIAGNOSIS — R55 Syncope and collapse: Secondary | ICD-10-CM | POA: Diagnosis not present

## 2016-11-12 NOTE — Progress Notes (Signed)
[facility] Summary Report / Loop Recorder 

## 2016-11-14 DIAGNOSIS — J3089 Other allergic rhinitis: Secondary | ICD-10-CM | POA: Diagnosis not present

## 2016-11-14 DIAGNOSIS — J301 Allergic rhinitis due to pollen: Secondary | ICD-10-CM | POA: Diagnosis not present

## 2016-11-15 ENCOUNTER — Telehealth: Payer: Self-pay | Admitting: Endocrinology

## 2016-11-15 NOTE — Telephone Encounter (Signed)
I contacted the patient. Patient wanted to know the name of the medication we provided for him a couple of months ago for the back pain he was having. Patient stated he is starting to have the same problem again and wanted to know if we could resubmit the medication for him? Please advise, Thanks!

## 2016-11-15 NOTE — Telephone Encounter (Signed)
Patient ask you to give him a call as soon as possible

## 2016-11-15 NOTE — Telephone Encounter (Signed)
Pt has seen neurosurg for low-back pain, but I can't see that he has recently been seen here.  Please advise OV here, or I would be happy to refer you.

## 2016-11-15 NOTE — Telephone Encounter (Signed)
Patient coming tomorrow at 930 to further discuss.

## 2016-11-16 ENCOUNTER — Encounter: Payer: Self-pay | Admitting: Endocrinology

## 2016-11-16 ENCOUNTER — Ambulatory Visit (INDEPENDENT_AMBULATORY_CARE_PROVIDER_SITE_OTHER): Payer: Medicare Other | Admitting: Endocrinology

## 2016-11-16 DIAGNOSIS — G8929 Other chronic pain: Secondary | ICD-10-CM

## 2016-11-16 DIAGNOSIS — M545 Low back pain, unspecified: Secondary | ICD-10-CM | POA: Insufficient documentation

## 2016-11-16 LAB — URINALYSIS, ROUTINE W REFLEX MICROSCOPIC
Bilirubin Urine: NEGATIVE
Hgb urine dipstick: NEGATIVE
Ketones, ur: NEGATIVE
LEUKOCYTES UA: NEGATIVE
Nitrite: NEGATIVE
PH: 6.5 (ref 5.0–8.0)
RBC / HPF: NONE SEEN (ref 0–?)
SPECIFIC GRAVITY, URINE: 1.02 (ref 1.000–1.030)
TOTAL PROTEIN, URINE-UPE24: NEGATIVE
UROBILINOGEN UA: 0.2 (ref 0.0–1.0)
Urine Glucose: NEGATIVE
WBC, UA: NONE SEEN (ref 0–?)

## 2016-11-16 LAB — SEDIMENTATION RATE: Sed Rate: 16 mm/hr (ref 0–20)

## 2016-11-16 MED ORDER — TRAMADOL-ACETAMINOPHEN 37.5-325 MG PO TABS: 1.0000 | ORAL_TABLET | Freq: Four times a day (QID) | ORAL | 0 refills | Status: DC | PRN

## 2016-11-16 NOTE — Patient Instructions (Addendum)
Here is a prescription for the pain.    A urine blood test is requested for you today.  We'll let you know about the results.   I hope you feel better soon.  If you don't feel better by week after next, please call back.

## 2016-11-16 NOTE — Progress Notes (Signed)
Subjective:    Patient ID: Joseph Rocha, male    DOB: 1935-09-20, 82 y.o.   MRN: [id number]  HPI Pt states many years of intermittent moderate pain at the lower back, but no assoc numbness.  He is uncertain why it is worse recently. Past Medical History:  Diagnosis Date  . Allergy   . Anemia   . Diabetes mellitus   . Elevated PSA   . Hyperlipidemia   . Hypertension   . Hypogonadism male   . OA (osteoarthritis)   . 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 DOPPLER ECHOCARDIOGRAPHY  12/04/2001  . ELECTROCARDIOGRAM  03/25/2006  . EP IMPLANTABLE DEVICE N/A 03/16/2016   Procedure: Loop Recorder Insertion;  Surgeon: Deboraha Sprang, MD;  Location: [facility];  Service: Cardiovascular;  Laterality: N/A;  . KNEE ARTHROSCOPY Left   . LUMBAR DISC SURGERY    . MENISCUS REPAIR Right may 2015  . TONSILLECTOMY    . TRANSPHENOIDAL / TRANSNASAL HYPOPHYSECTOMY / RESECTION PITUITARY TUMOR  2005    Social History   Social History  . Marital status: Married    Spouse name: N/A  . Number of children: 2  . Years of education: N/A   Occupational History  . retired Retired   Social History Main Topics  . Smoking status: Former Smoker    Quit date: 02/22/1957  . Smokeless tobacco: Never Used  . Alcohol use No  . Drug use: No  . Sexual activity: Not Currently   Other Topics Concern  . Not on file   Social History Narrative   Lives with wife.  They have two grown children.   He is retired from the [employer].    Current Outpatient Prescriptions on File Prior to Visit  Medication Sig Dispense Refill  . calcium carbonate (OS-CAL) 600 MG TABS Take 600 mg by mouth daily.    . cetirizine-pseudoephedrine (ZYRTEC-D) 5-120 MG tablet Take 1 tablet by mouth daily. 90 tablet 2  . Cholecalciferol (VITAMIN D3 PO) Take 600 mcg by mouth daily.     . finasteride (PROSCAR) 5 MG tablet Take 5 mg by mouth daily.      . fish oil-omega-3  fatty acids 1000 MG capsule Take 1 g by mouth daily.    . fludrocortisone (FLORINEF) 0.1 MG tablet Take 1 tablet (0.1 mg total) by mouth daily. 90 tablet 2  . folic acid (FOLVITE) 1 MG tablet Take 1 tablet (1 mg total) by mouth daily. 90 tablet 2  . furosemide (LASIX) 20 MG tablet Take 1 tablet (20 mg total) by mouth daily. 30 tablet 3  . KLOR-CON M20 20 MEQ tablet Take 20 mEq by mouth daily.     Marland Kitchen levothyroxine (SYNTHROID, LEVOTHROID) 50 MCG tablet Take 1 tablet (50 mcg total) by mouth daily. 90 tablet 2  . magnesium oxide (MAG-OX) 400 MG tablet Take 0.5 tablets (200 mg total) by mouth daily. 45 tablet 2  . meloxicam (MOBIC) 15 MG tablet Take 1 tablet (15 mg total) by mouth daily. 90 tablet 2  . methocarbamol (ROBAXIN) 500 MG tablet Take 1 tablet (500 mg total) by mouth at bedtime as needed for muscle spasms. 90 tablet 2  . Multiple Vitamins-Minerals (CENTRUM SILVER PO) Take 1 tablet by mouth daily.      Marland Kitchen omeprazole (PRILOSEC) 40 MG capsule Take 1 capsule (40 mg total) by mouth daily. 90 capsule 2  . ondansetron (ZOFRAN) 4 MG tablet  Take 1 tablet (4 mg total) by mouth every 8 (eight) hours as needed for nausea or vomiting. 20 tablet 0  . polyethylene glycol (MIRALAX / GLYCOLAX) packet Take 17 g by mouth every other day. Mix with water 14 each 2  . polyethylene glycol powder (GLYCOLAX/MIRALAX) powder take 17GM (DISSOLVED IN WATER) by mouth once daily 255 g 0  . rosuvastatin (CRESTOR) 5 MG tablet Take 1 tablet (5 mg total) by mouth daily. 90 tablet 1  . Tamsulosin HCl (FLOMAX) 0.4 MG CAPS Take 0.4 mg by mouth daily.      . Testosterone (AXIRON) 30 MG/ACT SOLN Place 1 each onto the skin daily.      No current facility-administered medications on file prior to visit.     Allergies  Allergen Reactions  . Lipitor [Atorvastatin] Other (See Comments)    Myalgias, cramps in hand    Family History  Problem Relation Age of Onset  . Cancer Brother     uncertain type  . Diabetes Mellitus II  Mother     Deceased, 35s  . Heart attack Father   . Healthy Daughter     BP 122/80   Pulse 76   Temp 97 F (36.1 C)   Ht 6\' 3"  (1.905 m)   Wt 227 lb (103 kg)   BMI 28.37 kg/m   Review of Systems Denies bowel or bladder retention.      Objective:   Physical Exam VITAL SIGNS:  See vs page GENERAL: no distress.   Spine: nontender. Gait: normal and steady   Lab Results  Component Value Date   CREATININE 1.42 10/03/2016   BUN 28 (H) 10/03/2016   NA 140 10/03/2016   K 3.6 10/03/2016   CL 104 10/03/2016   CO2 30 10/03/2016      Assessment & Plan:  Chronic low-back pain: worse.  Renal insuff: this limits rx options.    Patient is advised the following: Patient Instructions  Here is a prescription for the pain.    A urine blood test is requested for you today.  We'll let you know about the results.   I hope you feel better soon.  If you don't feel better by week after next, please call back.

## 2016-11-20 ENCOUNTER — Encounter: Payer: Self-pay | Admitting: Physician Assistant

## 2016-11-20 ENCOUNTER — Ambulatory Visit (INDEPENDENT_AMBULATORY_CARE_PROVIDER_SITE_OTHER): Payer: Medicare Other | Admitting: Physician Assistant

## 2016-11-20 ENCOUNTER — Other Ambulatory Visit: Payer: Medicare Other

## 2016-11-20 VITALS — BP 102/58 | HR 84 | Ht 74.0 in | Wt 226.0 lb

## 2016-11-20 DIAGNOSIS — R197 Diarrhea, unspecified: Secondary | ICD-10-CM | POA: Diagnosis not present

## 2016-11-20 DIAGNOSIS — K219 Gastro-esophageal reflux disease without esophagitis: Secondary | ICD-10-CM

## 2016-11-20 DIAGNOSIS — J3089 Other allergic rhinitis: Secondary | ICD-10-CM | POA: Diagnosis not present

## 2016-11-20 DIAGNOSIS — R630 Anorexia: Secondary | ICD-10-CM

## 2016-11-20 DIAGNOSIS — J301 Allergic rhinitis due to pollen: Secondary | ICD-10-CM | POA: Diagnosis not present

## 2016-11-20 NOTE — Patient Instructions (Addendum)
Your physician has requested that you go to the basement for lab work before leaving today.  We have given you a high fiber diet handout. Please strive to have 25-30 grams of fiber daily.   Your provider suggest that you drink more water. Try to have at least 6-8 8 oz glasses of water daily.   Please purchase the following medications over the counter and take as directed: Probiotic such as Align once daily.   Stop Miralax as directed, continue if necessary.

## 2016-11-20 NOTE — Progress Notes (Signed)
Reviewed and agree with initial management plan.  Keni Elison T. Wania Longstreth, MD FACG 

## 2016-11-20 NOTE — Progress Notes (Signed)
Chief Complaint: Loss of Appetite, Diarrhea  HPI:  [name] is a 82 year old African-American male with a past medical history of diabetes, hyperlipidemia, hypertension and osteoarthritis,  who presents to clinic today with a complaint of recent episodes of appetite loss and loose stools.    Patient has previously followed with Dr. Fuller Plan for his colonoscopies, last seen 12/15/10 for colonoscopy with mild diverticulosis in the sigmoid colon and internal hemorrhoids. He was told to repeat in 10 years.   Today, the patient tells me that he called our office in early January (11/06/16) and around that time was experiencing a loss of appetite. This was associated with some looser bowel movements than normal. Patient tells me that he'll have bowel movements at least 2-3 times a day, typically in the morning. After he called our office, 4-5 days later his appetite came back and everything was normal, but then over the past couple of days he has returned to looser bowel movements, at least 2-3 a day, as well as a decrease in appetite. Patient does describe a 5 pound weight loss over the past 2 weeks. He does tell me that he continues to use his MiraLAX every other day for his history of constipation, even during times of his recent loose stools and has also started to use Black seed oil, once 10 days ago and then again 3 days ago. This is also supposed to be for constipation per the patient. Patient does describe a general "queasy/uneasiness" of his stomach before his bowel movements, this is relieved afterwards. He also describes a darker appearance to his stools, but denies "black or sticky stools".   Patient's reflux remains well controlled on Omeprazole 40 mg daily.   Patient denies fever, chills, blood in his stool, fatigue, nausea, vomiting or abdominal pain.  Past Medical History:  Diagnosis Date  . Allergy   . Anemia   . Diabetes mellitus   . Elevated PSA   . Hyperlipidemia   . Hypertension   .  Hypogonadism male   . OA (osteoarthritis)   . Pituitary abnormality ([place]) 2004    Past Surgical History:  Procedure Laterality Date  . DOPPLER ECHOCARDIOGRAPHY  12/04/2001  . ELECTROCARDIOGRAM  03/25/2006  . EP IMPLANTABLE DEVICE N/A 03/16/2016   Procedure: Loop Recorder Insertion;  Surgeon: Deboraha Sprang, MD;  Location: [facility];  Service: Cardiovascular;  Laterality: N/A;  . KNEE ARTHROSCOPY Left   . LUMBAR DISC SURGERY    . MENISCUS REPAIR Right may 2015  . TONSILLECTOMY    . TRANSPHENOIDAL / TRANSNASAL HYPOPHYSECTOMY / RESECTION PITUITARY TUMOR  2005    Current Outpatient Prescriptions  Medication Sig Dispense Refill  . calcium carbonate (OS-CAL) 600 MG TABS Take 600 mg by mouth daily.    . cetirizine-pseudoephedrine (ZYRTEC-D) 5-120 MG tablet Take 1 tablet by mouth daily. 90 tablet 2  . Cholecalciferol (VITAMIN D3 PO) Take 600 mcg by mouth daily.     . finasteride (PROSCAR) 5 MG tablet Take 5 mg by mouth daily.      . fish oil-omega-3 fatty acids 1000 MG capsule Take 1 g by mouth daily.    . fludrocortisone (FLORINEF) 0.1 MG tablet Take 1 tablet (0.1 mg total) by mouth daily. 90 tablet 2  . folic acid (FOLVITE) 1 MG tablet Take 1 tablet (1 mg total) by mouth daily. 90 tablet 2  . furosemide (LASIX) 20 MG tablet Take 1 tablet (20 mg total) by mouth daily. 30 tablet 3  .  KLOR-CON M20 20 MEQ tablet Take 20 mEq by mouth daily.     Marland Kitchen levothyroxine (SYNTHROID, LEVOTHROID) 50 MCG tablet Take 1 tablet (50 mcg total) by mouth daily. 90 tablet 2  . magnesium oxide (MAG-OX) 400 MG tablet Take 0.5 tablets (200 mg total) by mouth daily. 45 tablet 2  . meloxicam (MOBIC) 15 MG tablet Take 1 tablet (15 mg total) by mouth daily. 90 tablet 2  . methocarbamol (ROBAXIN) 500 MG tablet Take 1 tablet (500 mg total) by mouth at bedtime as needed for muscle spasms. 90 tablet 2  . Multiple Vitamins-Minerals (CENTRUM SILVER PO) Take 1 tablet by mouth daily.      Marland Kitchen omeprazole (PRILOSEC) 40 MG  capsule Take 1 capsule (40 mg total) by mouth daily. 90 capsule 2  . ondansetron (ZOFRAN) 4 MG tablet Take 1 tablet (4 mg total) by mouth every 8 (eight) hours as needed for nausea or vomiting. 20 tablet 0  . polyethylene glycol (MIRALAX / GLYCOLAX) packet Take 17 g by mouth every other day. Mix with water 14 each 2  . polyethylene glycol powder (GLYCOLAX/MIRALAX) powder take 17GM (DISSOLVED IN WATER) by mouth once daily 255 g 0  . rosuvastatin (CRESTOR) 5 MG tablet Take 1 tablet (5 mg total) by mouth daily. 90 tablet 1  . Tamsulosin HCl (FLOMAX) 0.4 MG CAPS Take 0.4 mg by mouth daily.      . Testosterone (AXIRON) 30 MG/ACT SOLN Place 1 each onto the skin daily.     . traMADol-acetaminophen (ULTRACET) 37.5-325 MG tablet Take 1 tablet by mouth every 6 (six) hours as needed for moderate pain. 30 tablet 0   No current facility-administered medications for this visit.     Allergies as of 11/20/2016 - Review Complete 11/20/2016  Allergen Reaction Noted  . Lipitor [atorvastatin] Other (See Comments) 03/02/2014    Family History  Problem Relation Age of Onset  . Cancer Brother     uncertain type  . Diabetes Mellitus II Mother     Deceased, 17s  . Heart attack Father   . Healthy Daughter     Social History   Social History  . Marital status: Married    Spouse name: N/A  . Number of children: 2  . Years of education: N/A   Occupational History  . retired Retired   Social History Main Topics  . Smoking status: Former Smoker    Quit date: 02/22/1957  . Smokeless tobacco: Never Used  . Alcohol use No  . Drug use: No  . Sexual activity: Not Currently   Other Topics Concern  . Not on file   Social History Narrative   Lives with wife.  They have two grown children.   He is retired from the [employer].    Review of Systems:     Constitutional: No  fever, chills, weakness or fatigue HEENT: Eyes: No change in vision               Ears, Nose, Throat:  No change in  hearing Skin: No rash  Cardiovascular: No chest pain Respiratory: No SOB  Gastrointestinal: See HPI and otherwise negative Neurological: No headache, dizziness or syncope Musculoskeletal: No new muscle or joint pain Hematologic: No bleeding  Psychiatric: No history of depression or anxiety   Physical Exam:  Vital signs: BP (!) 102/58 (BP Location: Left Arm, Patient Position: Sitting, Cuff Size: Normal)   Pulse 84   Ht 6\' 2"  (1.88 m)   Wt 226 lb (102.5  kg)   BMI 29.02 kg/m   Constitutional:   Pleasant African-American male appears to be in NAD, Well developed, Well nourished, alert and cooperative Head:  Normocephalic and atraumatic. Eyes:   PEERL, EOMI. No icterus. Conjunctiva pink. Ears:  Normal auditory acuity. Neck:  Supple Throat: Oral cavity and pharynx without inflammation, swelling or lesion.  Respiratory: Respirations even and unlabored. Lungs clear to auscultation bilaterally.   No wheezes, crackles, or rhonchi.  Cardiovascular: Normal S1, S2. No MRG. Regular rate and rhythm. No peripheral edema, cyanosis or pallor.  Gastrointestinal:  Soft, nondistended, nontender. No rebound or guarding. Normal bowel sounds. No appreciable masses or hepatomegaly. Rectal:  Not performed.  Msk:  Symmetrical without gross deformities. Without edema, no deformity or joint abnormality. Ambulates with a cane Neurologic:  Alert and  oriented x4;  grossly normal neurologically.  Skin:   Dry and intact without significant lesions or rashes. Psychiatric:  Demonstrates good judgement and reason without abnormal affect or behaviors.  RELEVANT LABS AND IMAGING: CBC    Component Value Date/Time   WBC 7.0 07/26/2016 1557   RBC 3.64 (L) 07/26/2016 1557   HGB 11.1 (L) 07/26/2016 1557   HCT 33.3 (L) 07/26/2016 1557   PLT 182.0 07/26/2016 1557   MCV 91.5 07/26/2016 1557   MCH 30.6 03/15/2013 0743   MCHC 33.5 07/26/2016 1557   RDW 15.1 07/26/2016 1557   LYMPHSABS 2.1 07/26/2016 1557   MONOABS  0.7 07/26/2016 1557   EOSABS 0.2 07/26/2016 1557   BASOSABS 0.1 07/26/2016 1557    CMP     Component Value Date/Time   NA 140 10/03/2016 1052   K 3.6 10/03/2016 1052   CL 104 10/03/2016 1052   CO2 30 10/03/2016 1052   GLUCOSE 93 10/03/2016 1052   BUN 28 (H) 10/03/2016 1052   CREATININE 1.42 10/03/2016 1052   CREATININE 1.20 01/29/2014 1447   CALCIUM 9.1 10/03/2016 1052   CALCIUM 9.1 02/25/2008 0340   PROT 6.9 07/26/2016 1557   ALBUMIN 3.6 07/26/2016 1557   AST 24 07/26/2016 1557   ALT 17 07/26/2016 1557   ALKPHOS 52 07/26/2016 1557   BILITOT 0.4 07/26/2016 1557   GFRNONAA 58 (L) 01/29/2014 1447   GFRAA 67 01/29/2014 1447    Assessment: 1.Diarrhea: Intermittently over the past 2 weeks, it first occurred for 4-5 days and now again for the past 2-3 days, associated with "uneasy stomach", relieved after a bowel movement, also with appetite loss; consider viral cause versus IBS versus infectious cause versus black seed oil vs other 2. GERD: Well controlled on Omeprazole 40 mg daily 3. Appetite loss: With diarrhea as above  Plan: 1. Ordered stool studies to include a GI pathogen panel and O&P 2. Recommend the patient increase fiber in his diet, provided him with a handout. Also recommend he start a probiotic such as Align for the next 2 months 3. Recommend the patient increase water intake to 6-8 8 ounce glasses of water per day  4. Patient should hold his MiraLAX  if he is experiencing loose stools, he should also discontinue use of black seed oil as this may be related to his recent symptoms 4. Continue Omeprazole 40 mg daily 5. Patient to follow in clinic in 1-2 months or sooner with myself or Dr. Royston Cowper, PA-C [facility] 11/20/2016, 2:47 PM  Cc: Renato Shin, MD

## 2016-11-26 ENCOUNTER — Other Ambulatory Visit: Payer: Medicare Other

## 2016-11-26 DIAGNOSIS — K219 Gastro-esophageal reflux disease without esophagitis: Secondary | ICD-10-CM | POA: Diagnosis not present

## 2016-11-26 DIAGNOSIS — R630 Anorexia: Secondary | ICD-10-CM | POA: Diagnosis not present

## 2016-11-26 DIAGNOSIS — R197 Diarrhea, unspecified: Secondary | ICD-10-CM | POA: Diagnosis not present

## 2016-11-27 LAB — GASTROINTESTINAL PATHOGEN PANEL PCR
C. DIFFICILE TOX A/B, PCR: NOT DETECTED
CRYPTOSPORIDIUM, PCR: NOT DETECTED
Campylobacter, PCR: NOT DETECTED
E COLI (STEC) STX1/STX2, PCR: NOT DETECTED
E coli (ETEC) LT/ST PCR: NOT DETECTED
E coli 0157, PCR: NOT DETECTED
GIARDIA LAMBLIA, PCR: NOT DETECTED
Norovirus, PCR: NOT DETECTED
ROTAVIRUS, PCR: NOT DETECTED
Salmonella, PCR: NOT DETECTED
Shigella, PCR: NOT DETECTED

## 2016-11-27 LAB — OVA AND PARASITE EXAMINATION: OP: NONE SEEN

## 2016-11-28 DIAGNOSIS — J3089 Other allergic rhinitis: Secondary | ICD-10-CM | POA: Diagnosis not present

## 2016-11-28 DIAGNOSIS — J301 Allergic rhinitis due to pollen: Secondary | ICD-10-CM | POA: Diagnosis not present

## 2016-11-29 ENCOUNTER — Other Ambulatory Visit: Payer: Self-pay | Admitting: Gastroenterology

## 2016-11-30 ENCOUNTER — Telehealth: Payer: Self-pay | Admitting: Gastroenterology

## 2016-11-30 NOTE — Telephone Encounter (Signed)
Labs have been mailed as requested.

## 2016-12-01 LAB — CUP PACEART REMOTE DEVICE CHECK
Date Time Interrogation Session: 20171208130559
Implantable Pulse Generator Implant Date: 20170512

## 2016-12-01 NOTE — Progress Notes (Signed)
Carelink summary report received. Battery status OK. Normal device function. No new symptom episodes, tachy episodes, brady, or pause episodes. No new AF episodes. Monthly summary reports and ROV/PRN 

## 2016-12-05 DIAGNOSIS — J3089 Other allergic rhinitis: Secondary | ICD-10-CM | POA: Diagnosis not present

## 2016-12-05 DIAGNOSIS — J301 Allergic rhinitis due to pollen: Secondary | ICD-10-CM | POA: Diagnosis not present

## 2016-12-11 ENCOUNTER — Ambulatory Visit (INDEPENDENT_AMBULATORY_CARE_PROVIDER_SITE_OTHER): Payer: Medicare Other | Admitting: *Deleted

## 2016-12-11 DIAGNOSIS — R55 Syncope and collapse: Secondary | ICD-10-CM | POA: Diagnosis not present

## 2016-12-11 DIAGNOSIS — J301 Allergic rhinitis due to pollen: Secondary | ICD-10-CM | POA: Diagnosis not present

## 2016-12-11 DIAGNOSIS — J3089 Other allergic rhinitis: Secondary | ICD-10-CM | POA: Diagnosis not present

## 2016-12-11 NOTE — Progress Notes (Signed)
[facility] Summary Report / Loop Recorder 

## 2016-12-18 DIAGNOSIS — J3089 Other allergic rhinitis: Secondary | ICD-10-CM | POA: Diagnosis not present

## 2016-12-18 DIAGNOSIS — J301 Allergic rhinitis due to pollen: Secondary | ICD-10-CM | POA: Diagnosis not present

## 2016-12-19 DIAGNOSIS — D497 Neoplasm of unspecified behavior of endocrine glands and other parts of nervous system: Secondary | ICD-10-CM | POA: Diagnosis not present

## 2016-12-19 DIAGNOSIS — M4802 Spinal stenosis, cervical region: Secondary | ICD-10-CM | POA: Diagnosis not present

## 2016-12-19 DIAGNOSIS — M79641 Pain in right hand: Secondary | ICD-10-CM | POA: Diagnosis not present

## 2016-12-19 DIAGNOSIS — M542 Cervicalgia: Secondary | ICD-10-CM | POA: Diagnosis not present

## 2016-12-19 DIAGNOSIS — M5412 Radiculopathy, cervical region: Secondary | ICD-10-CM | POA: Diagnosis not present

## 2016-12-21 IMAGING — NM NM MISC PROCEDURE
3 series · 18 of 18 positions shown · non-contrast
Comparison: none

[Series 1: stress-gsp_(id)_sa · 6.4mm · 6.40mm/px · 6 of 512 frames shown]
[frame 43/512]
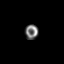
[frame 128/512]
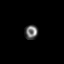
[frame 214/512]
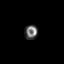
[frame 299/512]
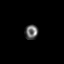
[frame 384/512]
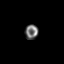
[frame 470/512]
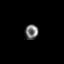

[Series 1: rest_(id)_sa · 6.4mm · 6.40mm/px · 6 of 64 frames shown]
[frame 6/64]
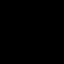
[frame 16/64]
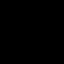
[frame 27/64]
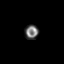
[frame 38/64]
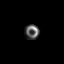
[frame 48/64]
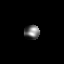
[frame 59/64]
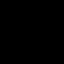

[Series 1: stress-sum-em_(id)_sa · 6.4mm · 6.40mm/px · 6 of 64 frames shown]
[frame 6/64]
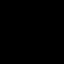
[frame 16/64]
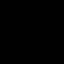
[frame 27/64]
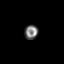
[frame 38/64]
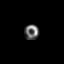
[frame 48/64]
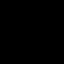
[frame 59/64]
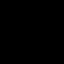

[18 of 18 positions shown; findings below may reference images not displayed]

Canned report from images found in remote index.

Refer to host system for actual result text.

## 2016-12-24 ENCOUNTER — Ambulatory Visit (INDEPENDENT_AMBULATORY_CARE_PROVIDER_SITE_OTHER): Payer: Medicare Other | Admitting: Gastroenterology

## 2016-12-24 ENCOUNTER — Encounter: Payer: Self-pay | Admitting: Gastroenterology

## 2016-12-24 VITALS — BP 128/62 | HR 76 | Ht 74.0 in | Wt 227.2 lb

## 2016-12-24 DIAGNOSIS — K219 Gastro-esophageal reflux disease without esophagitis: Secondary | ICD-10-CM

## 2016-12-24 DIAGNOSIS — R197 Diarrhea, unspecified: Secondary | ICD-10-CM

## 2016-12-24 NOTE — Patient Instructions (Signed)
Follow up as needed.   Thank you for choosing me and [facility].  Malcolm T. Stark, Jr., MD., FACG  

## 2016-12-24 NOTE — Progress Notes (Signed)
    History of Present Illness: This is a 82 year old male with resolved diarrhea felt likely to be post infectious. Stool studies were negative. He is currently asymptomatic. His GERD is well-controlled on his current regimen.  Current Medications, Allergies, Past Medical History, Past Surgical History, Family History and Social History were reviewed in [employer] record.  Physical Exam: General: Well developed, well nourished, no acute distress Head: Normocephalic and atraumatic Eyes:  sclerae anicteric, EOMI Ears: Normal auditory acuity Mouth: No deformity or lesions Lungs: Clear throughout to auscultation Heart: Regular rate and rhythm; no murmurs, rubs or bruits Abdomen: Soft, non tender and non distended. No masses, hepatosplenomegaly or hernias noted. Normal Bowel sounds Musculoskeletal: Symmetrical with no gross deformities  Pulses:  Normal pulses noted Extremities: No clubbing, cyanosis, edema or deformities noted Neurological: Alert oriented x 4, grossly nonfocal Psychological:  Alert and cooperative. Normal mood and affect  Assessment and Recommendations:  1. GERD. Continue standard antireflux measures and omeprazole 40 mg daily. GI follow up prn.   2. Diarrhea, resolved. Likely post infectious. GI follow up prn.

## 2016-12-25 ENCOUNTER — Other Ambulatory Visit: Payer: Self-pay | Admitting: Neurosurgery

## 2016-12-25 DIAGNOSIS — D497 Neoplasm of unspecified behavior of endocrine glands and other parts of nervous system: Secondary | ICD-10-CM

## 2016-12-25 LAB — CUP PACEART REMOTE DEVICE CHECK
Date Time Interrogation Session: 20180107130759
Implantable Pulse Generator Implant Date: 20170512

## 2016-12-26 DIAGNOSIS — J3089 Other allergic rhinitis: Secondary | ICD-10-CM | POA: Diagnosis not present

## 2016-12-26 DIAGNOSIS — J301 Allergic rhinitis due to pollen: Secondary | ICD-10-CM | POA: Diagnosis not present

## 2017-01-02 ENCOUNTER — Ambulatory Visit
Admission: RE | Admit: 2017-01-02 | Discharge: 2017-01-02 | Disposition: A | Discharge status: Home / Self Care | Payer: Medicare Other | Source: Ambulatory Visit | Attending: Neurosurgery | Admitting: Neurosurgery

## 2017-01-02 DIAGNOSIS — J3089 Other allergic rhinitis: Secondary | ICD-10-CM | POA: Diagnosis not present

## 2017-01-02 DIAGNOSIS — J301 Allergic rhinitis due to pollen: Secondary | ICD-10-CM | POA: Diagnosis not present

## 2017-01-02 DIAGNOSIS — D497 Neoplasm of unspecified behavior of endocrine glands and other parts of nervous system: Secondary | ICD-10-CM

## 2017-01-02 DIAGNOSIS — C719 Malignant neoplasm of brain, unspecified: Secondary | ICD-10-CM | POA: Diagnosis not present

## 2017-01-02 MED ORDER — GADOBENATE DIMEGLUMINE 529 MG/ML IV SOLN
10.0000 mL | Freq: Once | INTRAVENOUS | Status: AC | PRN
  Administered 2017-01-02: 10 mL via INTRAVENOUS

## 2017-01-03 LAB — CUP PACEART REMOTE DEVICE CHECK
Implantable Pulse Generator Implant Date: 20170512
MDC IDC SESS DTM: 20180206130915

## 2017-01-03 NOTE — Progress Notes (Signed)
Carelink summary report received. Battery status OK. Normal device function. No new symptom episodes, tachy episodes, brady, or pause episodes. No new AF episodes. Monthly summary reports and ROV/PRN 

## 2017-01-09 DIAGNOSIS — J3089 Other allergic rhinitis: Secondary | ICD-10-CM | POA: Diagnosis not present

## 2017-01-09 DIAGNOSIS — J301 Allergic rhinitis due to pollen: Secondary | ICD-10-CM | POA: Diagnosis not present

## 2017-01-10 ENCOUNTER — Ambulatory Visit (INDEPENDENT_AMBULATORY_CARE_PROVIDER_SITE_OTHER): Payer: Medicare Other | Admitting: *Deleted

## 2017-01-10 DIAGNOSIS — R55 Syncope and collapse: Secondary | ICD-10-CM

## 2017-01-10 NOTE — Progress Notes (Signed)
[facility] Summary Report / Loop Recorder 

## 2017-01-16 DIAGNOSIS — J301 Allergic rhinitis due to pollen: Secondary | ICD-10-CM | POA: Diagnosis not present

## 2017-01-16 DIAGNOSIS — J3089 Other allergic rhinitis: Secondary | ICD-10-CM | POA: Diagnosis not present

## 2017-01-17 ENCOUNTER — Telehealth: Payer: Self-pay | Admitting: Cardiology

## 2017-01-17 NOTE — Telephone Encounter (Signed)
LMOVM requesting that pt send manual transmission b/c home monitor has not updated in at least 14 days.    

## 2017-01-22 DIAGNOSIS — J3089 Other allergic rhinitis: Secondary | ICD-10-CM | POA: Diagnosis not present

## 2017-01-22 DIAGNOSIS — J301 Allergic rhinitis due to pollen: Secondary | ICD-10-CM | POA: Diagnosis not present

## 2017-01-24 ENCOUNTER — Other Ambulatory Visit: Payer: Self-pay | Admitting: Endocrinology

## 2017-01-25 LAB — CUP PACEART REMOTE DEVICE CHECK
Date Time Interrogation Session: 20180308131338
MDC IDC PG IMPLANT DT: 20170512

## 2017-01-30 DIAGNOSIS — J3089 Other allergic rhinitis: Secondary | ICD-10-CM | POA: Diagnosis not present

## 2017-01-30 DIAGNOSIS — J301 Allergic rhinitis due to pollen: Secondary | ICD-10-CM | POA: Diagnosis not present

## 2017-02-04 ENCOUNTER — Other Ambulatory Visit: Payer: Self-pay | Admitting: Endocrinology

## 2017-02-04 ENCOUNTER — Telehealth: Payer: Self-pay | Admitting: Endocrinology

## 2017-02-04 MED ORDER — TRAMADOL-ACETAMINOPHEN 37.5-325 MG PO TABS: 1.0000 | ORAL_TABLET | Freq: Four times a day (QID) | ORAL | 0 refills | Status: DC | PRN

## 2017-02-04 NOTE — Telephone Encounter (Signed)
I contacted the patient and he requested a refill of his Tramadol. Dr.Gherghe signed during Dr. Cordelia Pen absence. Rx submitted to the [facility] on [address].

## 2017-02-04 NOTE — Telephone Encounter (Signed)
Pt called in to speak with [facility], he did not specify reasoning, just requests call back.

## 2017-02-06 DIAGNOSIS — D497 Neoplasm of unspecified behavior of endocrine glands and other parts of nervous system: Secondary | ICD-10-CM | POA: Diagnosis not present

## 2017-02-06 DIAGNOSIS — J301 Allergic rhinitis due to pollen: Secondary | ICD-10-CM | POA: Diagnosis not present

## 2017-02-06 DIAGNOSIS — J3089 Other allergic rhinitis: Secondary | ICD-10-CM | POA: Diagnosis not present

## 2017-02-06 DIAGNOSIS — M542 Cervicalgia: Secondary | ICD-10-CM | POA: Diagnosis not present

## 2017-02-06 DIAGNOSIS — M4802 Spinal stenosis, cervical region: Secondary | ICD-10-CM | POA: Diagnosis not present

## 2017-02-06 DIAGNOSIS — Z6826 Body mass index (BMI) 26.0-26.9, adult: Secondary | ICD-10-CM | POA: Diagnosis not present

## 2017-02-11 ENCOUNTER — Ambulatory Visit (INDEPENDENT_AMBULATORY_CARE_PROVIDER_SITE_OTHER): Payer: Medicare Other | Admitting: *Deleted

## 2017-02-11 DIAGNOSIS — R55 Syncope and collapse: Secondary | ICD-10-CM

## 2017-02-11 NOTE — Progress Notes (Signed)
[facility] Summary Report / Loop Recorder 

## 2017-02-13 DIAGNOSIS — J3089 Other allergic rhinitis: Secondary | ICD-10-CM | POA: Diagnosis not present

## 2017-02-13 DIAGNOSIS — J301 Allergic rhinitis due to pollen: Secondary | ICD-10-CM | POA: Diagnosis not present

## 2017-02-14 DIAGNOSIS — M19041 Primary osteoarthritis, right hand: Secondary | ICD-10-CM | POA: Diagnosis not present

## 2017-02-19 LAB — CUP PACEART REMOTE DEVICE CHECK
Implantable Pulse Generator Implant Date: 20170512
MDC IDC SESS DTM: 20180407134128

## 2017-02-19 NOTE — Progress Notes (Signed)
Carelink summary report received. Battery status OK. Normal device function. No new symptom episodes, tachy episodes, brady, or pause episodes. No new AF episodes. Monthly summary reports and ROV/PRN 

## 2017-02-20 DIAGNOSIS — J301 Allergic rhinitis due to pollen: Secondary | ICD-10-CM | POA: Diagnosis not present

## 2017-02-20 DIAGNOSIS — J3089 Other allergic rhinitis: Secondary | ICD-10-CM | POA: Diagnosis not present

## 2017-02-21 ENCOUNTER — Telehealth: Payer: Self-pay | Admitting: Cardiology

## 2017-02-21 DIAGNOSIS — M2560 Stiffness of unspecified joint, not elsewhere classified: Secondary | ICD-10-CM | POA: Diagnosis not present

## 2017-02-21 DIAGNOSIS — M542 Cervicalgia: Secondary | ICD-10-CM | POA: Diagnosis not present

## 2017-02-21 DIAGNOSIS — M545 Low back pain: Secondary | ICD-10-CM | POA: Diagnosis not present

## 2017-02-21 DIAGNOSIS — M4802 Spinal stenosis, cervical region: Secondary | ICD-10-CM | POA: Diagnosis not present

## 2017-02-21 NOTE — Telephone Encounter (Signed)
LMOVM requesting that pt send manual transmission b/c home monitor has not updated in at least 14 days.    

## 2017-02-26 DIAGNOSIS — M2560 Stiffness of unspecified joint, not elsewhere classified: Secondary | ICD-10-CM | POA: Diagnosis not present

## 2017-02-26 DIAGNOSIS — M545 Low back pain: Secondary | ICD-10-CM | POA: Diagnosis not present

## 2017-02-26 DIAGNOSIS — M542 Cervicalgia: Secondary | ICD-10-CM | POA: Diagnosis not present

## 2017-02-26 DIAGNOSIS — M4802 Spinal stenosis, cervical region: Secondary | ICD-10-CM | POA: Diagnosis not present

## 2017-02-27 DIAGNOSIS — J301 Allergic rhinitis due to pollen: Secondary | ICD-10-CM | POA: Diagnosis not present

## 2017-02-27 DIAGNOSIS — J3089 Other allergic rhinitis: Secondary | ICD-10-CM | POA: Diagnosis not present

## 2017-03-01 DIAGNOSIS — M542 Cervicalgia: Secondary | ICD-10-CM | POA: Diagnosis not present

## 2017-03-01 DIAGNOSIS — M4802 Spinal stenosis, cervical region: Secondary | ICD-10-CM | POA: Diagnosis not present

## 2017-03-01 DIAGNOSIS — M2560 Stiffness of unspecified joint, not elsewhere classified: Secondary | ICD-10-CM | POA: Diagnosis not present

## 2017-03-01 DIAGNOSIS — M545 Low back pain: Secondary | ICD-10-CM | POA: Diagnosis not present

## 2017-03-05 DIAGNOSIS — M545 Low back pain: Secondary | ICD-10-CM | POA: Diagnosis not present

## 2017-03-05 DIAGNOSIS — M542 Cervicalgia: Secondary | ICD-10-CM | POA: Diagnosis not present

## 2017-03-05 DIAGNOSIS — M2560 Stiffness of unspecified joint, not elsewhere classified: Secondary | ICD-10-CM | POA: Diagnosis not present

## 2017-03-05 DIAGNOSIS — M4802 Spinal stenosis, cervical region: Secondary | ICD-10-CM | POA: Diagnosis not present

## 2017-03-06 DIAGNOSIS — J3089 Other allergic rhinitis: Secondary | ICD-10-CM | POA: Diagnosis not present

## 2017-03-06 DIAGNOSIS — J301 Allergic rhinitis due to pollen: Secondary | ICD-10-CM | POA: Diagnosis not present

## 2017-03-07 DIAGNOSIS — M2560 Stiffness of unspecified joint, not elsewhere classified: Secondary | ICD-10-CM | POA: Diagnosis not present

## 2017-03-07 DIAGNOSIS — M545 Low back pain: Secondary | ICD-10-CM | POA: Diagnosis not present

## 2017-03-07 DIAGNOSIS — M542 Cervicalgia: Secondary | ICD-10-CM | POA: Diagnosis not present

## 2017-03-07 DIAGNOSIS — M4802 Spinal stenosis, cervical region: Secondary | ICD-10-CM | POA: Diagnosis not present

## 2017-03-11 ENCOUNTER — Ambulatory Visit (INDEPENDENT_AMBULATORY_CARE_PROVIDER_SITE_OTHER): Payer: Medicare Other | Admitting: *Deleted

## 2017-03-11 DIAGNOSIS — M4802 Spinal stenosis, cervical region: Secondary | ICD-10-CM | POA: Diagnosis not present

## 2017-03-11 DIAGNOSIS — M2560 Stiffness of unspecified joint, not elsewhere classified: Secondary | ICD-10-CM | POA: Diagnosis not present

## 2017-03-11 DIAGNOSIS — M545 Low back pain: Secondary | ICD-10-CM | POA: Diagnosis not present

## 2017-03-11 DIAGNOSIS — R55 Syncope and collapse: Secondary | ICD-10-CM

## 2017-03-11 DIAGNOSIS — M542 Cervicalgia: Secondary | ICD-10-CM | POA: Diagnosis not present

## 2017-03-11 NOTE — Progress Notes (Signed)
[facility] Summary Report / Loop Recorder 

## 2017-03-13 DIAGNOSIS — J3089 Other allergic rhinitis: Secondary | ICD-10-CM | POA: Diagnosis not present

## 2017-03-13 DIAGNOSIS — M4802 Spinal stenosis, cervical region: Secondary | ICD-10-CM | POA: Diagnosis not present

## 2017-03-13 DIAGNOSIS — J301 Allergic rhinitis due to pollen: Secondary | ICD-10-CM | POA: Diagnosis not present

## 2017-03-13 DIAGNOSIS — M542 Cervicalgia: Secondary | ICD-10-CM | POA: Diagnosis not present

## 2017-03-13 DIAGNOSIS — M2560 Stiffness of unspecified joint, not elsewhere classified: Secondary | ICD-10-CM | POA: Diagnosis not present

## 2017-03-13 DIAGNOSIS — M545 Low back pain: Secondary | ICD-10-CM | POA: Diagnosis not present

## 2017-03-15 DIAGNOSIS — M19041 Primary osteoarthritis, right hand: Secondary | ICD-10-CM | POA: Diagnosis not present

## 2017-03-20 DIAGNOSIS — M4802 Spinal stenosis, cervical region: Secondary | ICD-10-CM | POA: Diagnosis not present

## 2017-03-20 DIAGNOSIS — J301 Allergic rhinitis due to pollen: Secondary | ICD-10-CM | POA: Diagnosis not present

## 2017-03-20 DIAGNOSIS — M542 Cervicalgia: Secondary | ICD-10-CM | POA: Diagnosis not present

## 2017-03-20 DIAGNOSIS — M2560 Stiffness of unspecified joint, not elsewhere classified: Secondary | ICD-10-CM | POA: Diagnosis not present

## 2017-03-20 DIAGNOSIS — J3089 Other allergic rhinitis: Secondary | ICD-10-CM | POA: Diagnosis not present

## 2017-03-20 DIAGNOSIS — M545 Low back pain: Secondary | ICD-10-CM | POA: Diagnosis not present

## 2017-03-25 LAB — CUP PACEART REMOTE DEVICE CHECK
Date Time Interrogation Session: 20180507134123
MDC IDC PG IMPLANT DT: 20170512

## 2017-03-27 DIAGNOSIS — J3089 Other allergic rhinitis: Secondary | ICD-10-CM | POA: Diagnosis not present

## 2017-03-27 DIAGNOSIS — J301 Allergic rhinitis due to pollen: Secondary | ICD-10-CM | POA: Diagnosis not present

## 2017-04-02 DIAGNOSIS — M2560 Stiffness of unspecified joint, not elsewhere classified: Secondary | ICD-10-CM | POA: Diagnosis not present

## 2017-04-02 DIAGNOSIS — M4802 Spinal stenosis, cervical region: Secondary | ICD-10-CM | POA: Diagnosis not present

## 2017-04-02 DIAGNOSIS — M545 Low back pain: Secondary | ICD-10-CM | POA: Diagnosis not present

## 2017-04-02 DIAGNOSIS — M542 Cervicalgia: Secondary | ICD-10-CM | POA: Diagnosis not present

## 2017-04-03 DIAGNOSIS — J301 Allergic rhinitis due to pollen: Secondary | ICD-10-CM | POA: Diagnosis not present

## 2017-04-03 DIAGNOSIS — J3089 Other allergic rhinitis: Secondary | ICD-10-CM | POA: Diagnosis not present

## 2017-04-04 DIAGNOSIS — M545 Low back pain: Secondary | ICD-10-CM | POA: Diagnosis not present

## 2017-04-04 DIAGNOSIS — M2560 Stiffness of unspecified joint, not elsewhere classified: Secondary | ICD-10-CM | POA: Diagnosis not present

## 2017-04-04 DIAGNOSIS — M542 Cervicalgia: Secondary | ICD-10-CM | POA: Diagnosis not present

## 2017-04-04 DIAGNOSIS — M4802 Spinal stenosis, cervical region: Secondary | ICD-10-CM | POA: Diagnosis not present

## 2017-04-10 ENCOUNTER — Ambulatory Visit (INDEPENDENT_AMBULATORY_CARE_PROVIDER_SITE_OTHER): Payer: Medicare Other | Admitting: *Deleted

## 2017-04-10 DIAGNOSIS — J301 Allergic rhinitis due to pollen: Secondary | ICD-10-CM | POA: Diagnosis not present

## 2017-04-10 DIAGNOSIS — R55 Syncope and collapse: Secondary | ICD-10-CM

## 2017-04-10 DIAGNOSIS — J3089 Other allergic rhinitis: Secondary | ICD-10-CM | POA: Diagnosis not present

## 2017-04-10 NOTE — Progress Notes (Signed)
[facility] Summary Report / Loop Recorder 

## 2017-04-15 LAB — CUP PACEART REMOTE DEVICE CHECK
MDC IDC PG IMPLANT DT: 20170512
MDC IDC SESS DTM: 20180606133938

## 2017-04-15 NOTE — Progress Notes (Signed)
Carelink summary report received. Battery status OK. Normal device function. No new symptom episodes, tachy episodes, brady, or pause episodes. No new AF episodes. Monthly summary reports and ROV/PRN 

## 2017-04-17 DIAGNOSIS — J3089 Other allergic rhinitis: Secondary | ICD-10-CM | POA: Diagnosis not present

## 2017-04-17 DIAGNOSIS — J301 Allergic rhinitis due to pollen: Secondary | ICD-10-CM | POA: Diagnosis not present

## 2017-04-19 ENCOUNTER — Telehealth: Payer: Self-pay | Admitting: Endocrinology

## 2017-04-19 NOTE — Telephone Encounter (Signed)
I contacted the patient and advised Dr. Loanne Drilling is not here and will not be back unitl 04/29/2017. Patient was advised to go to an urgent care while Dr. Loanne Drilling is out of town to be evaluated for his headache and sore throat.

## 2017-04-19 NOTE — Telephone Encounter (Signed)
Patient of Loanne Drilling needs appt for headache + sore throat.   Please advise,  -LL

## 2017-04-22 ENCOUNTER — Emergency Department (HOSPITAL_COMMUNITY): Payer: Medicare Other

## 2017-04-22 ENCOUNTER — Encounter (HOSPITAL_COMMUNITY): Payer: Self-pay

## 2017-04-22 ENCOUNTER — Emergency Department (HOSPITAL_COMMUNITY)
Admission: EM | Admit: 2017-04-22 | Discharge: 2017-04-22 | Disposition: A | Discharge status: Home / Self Care | Payer: Medicare Other | Attending: Emergency Medicine | Admitting: Emergency Medicine

## 2017-04-22 DIAGNOSIS — J0140 Acute pansinusitis, unspecified: Secondary | ICD-10-CM | POA: Insufficient documentation

## 2017-04-22 DIAGNOSIS — E039 Hypothyroidism, unspecified: Secondary | ICD-10-CM | POA: Diagnosis not present

## 2017-04-22 DIAGNOSIS — I10 Essential (primary) hypertension: Secondary | ICD-10-CM | POA: Insufficient documentation

## 2017-04-22 DIAGNOSIS — R55 Syncope and collapse: Secondary | ICD-10-CM | POA: Diagnosis not present

## 2017-04-22 DIAGNOSIS — R404 Transient alteration of awareness: Secondary | ICD-10-CM | POA: Diagnosis not present

## 2017-04-22 DIAGNOSIS — E119 Type 2 diabetes mellitus without complications: Secondary | ICD-10-CM | POA: Insufficient documentation

## 2017-04-22 DIAGNOSIS — Z87891 Personal history of nicotine dependence: Secondary | ICD-10-CM | POA: Insufficient documentation

## 2017-04-22 DIAGNOSIS — Z79899 Other long term (current) drug therapy: Secondary | ICD-10-CM | POA: Diagnosis not present

## 2017-04-22 DIAGNOSIS — R42 Dizziness and giddiness: Secondary | ICD-10-CM

## 2017-04-22 LAB — URINALYSIS, ROUTINE W REFLEX MICROSCOPIC
Bilirubin Urine: NEGATIVE
GLUCOSE, UA: NEGATIVE mg/dL
HGB URINE DIPSTICK: NEGATIVE
KETONES UR: NEGATIVE mg/dL
Leukocytes, UA: NEGATIVE
Nitrite: NEGATIVE
PROTEIN: NEGATIVE mg/dL
Specific Gravity, Urine: 1.006 (ref 1.005–1.030)
pH: 7 (ref 5.0–8.0)

## 2017-04-22 LAB — BASIC METABOLIC PANEL
Anion gap: 6 (ref 5–15)
BUN: 15 mg/dL (ref 6–20)
CALCIUM: 8.8 mg/dL — AB (ref 8.9–10.3)
CHLORIDE: 100 mmol/L — AB (ref 101–111)
CO2: 26 mmol/L (ref 22–32)
CREATININE: 1.23 mg/dL (ref 0.61–1.24)
GFR calc non Af Amer: 54 mL/min — ABNORMAL LOW (ref >60)
Glucose, Bld: 114 mg/dL — ABNORMAL HIGH (ref 65–99)
Potassium: 3.9 mmol/L (ref 3.5–5.1)
SODIUM: 132 mmol/L — AB (ref 135–145)

## 2017-04-22 LAB — CBC
HCT: 36.7 % — ABNORMAL LOW (ref 39.0–52.0)
Hemoglobin: 12.4 g/dL — ABNORMAL LOW (ref 13.0–17.0)
MCH: 29.6 pg (ref 26.0–34.0)
MCHC: 33.8 g/dL (ref 30.0–36.0)
MCV: 87.6 fL (ref 78.0–100.0)
PLATELETS: 189 10*3/uL (ref 150–400)
RBC: 4.19 MIL/uL — ABNORMAL LOW (ref 4.22–5.81)
RDW: 14.4 % (ref 11.5–15.5)
WBC: 9.4 10*3/uL (ref 4.0–10.5)

## 2017-04-22 LAB — I-STAT TROPONIN, ED: TROPONIN I, POC: 0 ng/mL (ref 0.00–0.08)

## 2017-04-22 MED ORDER — CETIRIZINE-PSEUDOEPHEDRINE ER 5-120 MG PO TB12: 1.0000 | ORAL_TABLET | Freq: Every day | ORAL | 0 refills | Status: DC

## 2017-04-22 MED ORDER — AMOXICILLIN-POT CLAVULANATE 875-125 MG PO TABS: 1.0000 | ORAL_TABLET | Freq: Two times a day (BID) | ORAL | 0 refills | Status: DC

## 2017-04-22 NOTE — ED Provider Notes (Signed)
[facility] DEPT Provider Note   CSN: [id number] Arrival date & time: 04/22/17  1206     History   Chief Complaint Chief Complaint  Patient presents with  . Hypotension  . Near Syncope    HPI Joseph Rocha is a 82 y.o. male.  HPI Patient states since Wednesday last week (about 6 days ago), he has been having some feeling of sinus congestion and pressure or nasal stuffiness. There is been a small amount of drainage. No fever or facial pain. He reports he's been feeling a little dizzy and lightheaded particularly with position change. He is not having chest pain or shortness of breath. He reports this morning he was going through a file cabinet, he was in an upright standing position when he got very lightheaded and felt that he would pass out. He was trying to get himself to a chair but then he did lose consciousness. His wife heard him fall and when she checked on him he was not responding but was breathing rather heavily. She immediately went to call 911 and when she returned he awakened and told her not to call EMS. Patient was alert and oriented to surroundings upon awakening. He reports he did not have chest pain or dyspnea before onset of syncope. He had been looking for his medications list because he planned to go to urgent care this morning for his sinus congestion and pressure. Past Medical History:  Diagnosis Date  . Allergy   . Anemia   . Diabetes mellitus   . Elevated PSA   . Hyperlipidemia   . Hypertension   . Hypogonadism male   . OA (osteoarthritis)   . Pituitary abnormality [facility]) 2004    Patient Active Problem List   Diagnosis Date Noted  . Low back pain 11/16/2016  . Edema 10/03/2016  . Dyspnea 10/03/2016  . Hypothyroidism 07/26/2016  . Chest wall pain 04/06/2016  . Numbness 04/06/2016  . Contusion of right foot 12/12/2015  . Hypotension 11/10/2014  . Loss of weight 07/07/2014  . Sick sinus syndrome ([place]) 03/02/2014  . Nausea with vomiting  03/25/2013  . Bilateral leg weakness 07/21/2012  . Encounter for long-term (current) use of other medications 02/13/2012  . Pituitary abnormality ([place]) 02/23/2011  . Lymphadenitis, acute 02/23/2011  . BRADYCARDIA 10/17/2009  . DIZZINESS 10/17/2009  . [place] DISEASE, CERVICAL 12/28/2008  . SINUSITIS- ACUTE-NOS 11/22/2008  . Hypogonadism male 09/01/2008  . 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 FOOT DROP, RIGHT 09/01/2008  . PSA, INCREASED 09/01/2008  . PITUITARY MACROADENOMA 05/06/2008  . HYPERCHOLESTEROLEMIA 05/06/2008  . SYNCOPE 05/06/2008  . Diabetes ([place]) 05/14/2007  . Anemia 05/14/2007  . ALLERGIC RHINITIS 05/14/2007  . DIVERTICULOSIS, COLON 05/14/2007  . OSTEOARTHRITIS 05/14/2007    Past Surgical History:  Procedure Laterality Date  . DOPPLER ECHOCARDIOGRAPHY  12/04/2001  . ELECTROCARDIOGRAM  03/25/2006  . EP IMPLANTABLE DEVICE N/A 03/16/2016   Procedure: Loop Recorder Insertion;  Surgeon: Deboraha Sprang, MD;  Location: [facility];  Service: Cardiovascular;  Laterality: N/A;  . KNEE ARTHROSCOPY Left   . LUMBAR DISC SURGERY    . MENISCUS REPAIR Right may 2015  . TONSILLECTOMY    . TRANSPHENOIDAL / TRANSNASAL HYPOPHYSECTOMY / RESECTION PITUITARY TUMOR  2005       Home Medications    Prior to Admission medications   Medication Sig Start Date End Date Taking? Authorizing Provider  calcium carbonate (OS-CAL) 600 MG TABS Take 600 mg by mouth daily.   Yes  [provider]  cetirizine-pseudoephedrine (ZYRTEC-D) 5-120 MG tablet Take 1 tablet by mouth daily. 08/02/16  Yes Renato Shin, MD  Cholecalciferol (VITAMIN D3 PO) Take 600 mcg by mouth daily.    Yes [provider]  EPINEPHrine 0.3 mg/0.3 mL IJ SOAJ injection Inject 0.3 mg into the muscle once as needed (allergic reaction).  11/08/16  Yes [provider]  finasteride (PROSCAR) 5 MG tablet Take 5 mg by mouth daily.     Yes [provider]  fish oil-omega-3 fatty acids  1000 MG capsule Take 1 g by mouth daily.   Yes [provider]  fludrocortisone (FLORINEF) 0.1 MG tablet Take 1 tablet (0.1 mg total) by mouth daily. 08/02/16  Yes Renato Shin, MD  folic acid (FOLVITE) 1 MG tablet Take 1 tablet (1 mg total) by mouth daily. 08/02/16  Yes Renato Shin, MD  furosemide (LASIX) 20 MG tablet take 1 tablet by mouth once daily 02/04/17  Yes Renato Shin, MD  HYDROcodone-acetaminophen (NORCO/VICODIN) 5-325 MG tablet Take 1 tablet by mouth every 6 (six) hours as needed for pain. 02/06/17  Yes [provider]  KLOR-CON M20 20 MEQ tablet Take 20 mEq by mouth daily.  03/15/13  Yes [provider]  levothyroxine (SYNTHROID, LEVOTHROID) 50 MCG tablet Take 1 tablet (50 mcg total) by mouth daily. 08/02/16  Yes Renato Shin, MD  magnesium oxide (MAG-OX) 400 MG tablet Take 0.5 tablets (200 mg total) by mouth daily. 08/02/16  Yes Renato Shin, MD  meloxicam (MOBIC) 15 MG tablet Take 1 tablet (15 mg total) by mouth daily. Patient taking differently: Take 15 mg by mouth daily as needed for pain.  08/02/16  Yes Renato Shin, MD  methocarbamol (ROBAXIN) 500 MG tablet Take 1 tablet (500 mg total) by mouth at bedtime as needed for muscle spasms. 08/02/16  Yes Renato Shin, MD  Multiple Vitamins-Minerals (CENTRUM SILVER PO) Take 1 tablet by mouth daily.     Yes [provider]  omeprazole (PRILOSEC) 40 MG capsule Take 1 capsule (40 mg total) by mouth daily. 08/02/16  Yes Renato Shin, MD  ondansetron (ZOFRAN) 4 MG tablet Take 1 tablet (4 mg total) by mouth every 8 (eight) hours as needed for nausea or vomiting. 08/15/16  Yes Renato Shin, MD  polyethylene glycol powder (GLYCOLAX/MIRALAX) powder take 17GM (DISSOLVED IN WATER) by mouth once daily Patient taking differently: take 17GM (DISSOLVED IN WATER) by mouth every other day 11/29/16  Yes Ladene Artist, MD  rosuvastatin (CRESTOR) 5 MG tablet TAKE 1 TABLET DAILY 01/24/17  Yes Renato Shin, MD  Tamsulosin  HCl (FLOMAX) 0.4 MG CAPS Take 0.4 mg by mouth daily.     Yes [provider]  Testosterone (AXIRON) 30 MG/ACT SOLN Place 1 each onto the skin daily.    Yes [provider]  traMADol-acetaminophen (ULTRACET) 37.5-325 MG tablet Take 1 tablet by mouth every 6 (six) hours as needed for moderate pain. 02/04/17  Yes Philemon Kingdom, MD  amoxicillin-clavulanate (AUGMENTIN) 875-125 MG tablet Take 1 tablet by mouth 2 (two) times daily. One po bid x 7 days 04/22/17   Charlesetta Shanks, MD  cetirizine-pseudoephedrine (ZYRTEC-D) 5-120 MG tablet Take 1 tablet by mouth daily. 04/22/17   Charlesetta Shanks, MD    Family History Family History  Problem Relation Age of Onset  . Cancer Brother        uncertain type  . Diabetes Mellitus II Mother        Deceased, 68s  . Heart attack Father   .  Healthy Daughter     Social History Social History  Substance Use Topics  . Smoking status: Former Smoker    Quit date: 02/22/1957  . Smokeless tobacco: Never Used  . Alcohol use No     Allergies   Lipitor [atorvastatin]   Review of Systems Review of Systems  10 Systems reviewed and are negative for acute change except as noted in the HPI.  Physical Exam Updated Vital Signs BP (!) 151/85   Pulse (!) 50   Temp 98.1 F (36.7 C) (Oral)   Resp 11   Ht 6\' 3"  (1.905 m)   Wt 94.3 kg (208 lb)   SpO2 97%   BMI 26.00 kg/m   Physical Exam  Constitutional: He is oriented to person, place, and time. He appears well-developed and well-nourished.  HENT:  Head: Normocephalic and atraumatic.  Bilateral TMs normal. Oropharynx widely patent. Posterior oropharynx clear. No facial tenderness to percussion. Nares are patent and clear.  Eyes: Conjunctivae and EOM are normal. Pupils are equal, round, and reactive to light.  Neck: Neck supple.  Cardiovascular: Normal rate, regular rhythm, normal heart sounds and intact distal pulses.   No murmur heard. Pulmonary/Chest: Effort normal and breath sounds  normal. No respiratory distress.  Abdominal: Soft. He exhibits no distension. There is no tenderness. There is no guarding.  Musculoskeletal: He exhibits no edema or tenderness.  Calves soft and Nontender. No peripheral edema.  Neurological: He is alert and oriented to person, place, and time. No cranial nerve deficit or sensory deficit. He exhibits normal muscle tone. Coordination normal.  Skin: Skin is warm and dry.  Psychiatric: He has a normal mood and affect.  Nursing note and vitals reviewed.    ED Treatments / Results  Labs (all labs ordered are listed, but only abnormal results are displayed) Labs Reviewed  BASIC METABOLIC PANEL - Abnormal; Notable for the following:       Result Value   Sodium 132 (*)    Chloride 100 (*)    Glucose, Bld 114 (*)    Calcium 8.8 (*)    GFR calc non Af Amer 54 (*)    All other components within normal limits  CBC - Abnormal; Notable for the following:    RBC 4.19 (*)    Hemoglobin 12.4 (*)    HCT 36.7 (*)    All other components within normal limits  URINALYSIS, ROUTINE W REFLEX MICROSCOPIC  I-STAT TROPOININ, ED    EKG  EKG Interpretation  Date/Time:  Monday April 22 2017 12:11:00 EDT Ventricular Rate:  51 PR Interval:  198 QRS Duration: 86 QT Interval:  446 QTC Calculation: 411 R Axis:   -9 Text Interpretation:  Sinus bradycardia Minimal voltage criteria for LVH, may be normal variant Nonspecific ST and T wave abnormality Abnormal ECG No significant change since last tracing Confirmed by Wandra Arthurs [id number]) on 04/25/2017 12:03:58 PM       Radiology No results found.  Procedures Procedures (including critical care time)  Medications Ordered in ED Medications - No data to display   Initial Impression / Assessment and Plan / ED Course  I have reviewed the triage vital signs and the nursing notes.  Pertinent labs & imaging results that were available during my care of the patient were reviewed by me and considered in my  medical decision making (see chart for details).     Final Clinical Impressions(s) / ED Diagnoses   Final diagnoses:  Acute pansinusitis, recurrence not specified  Dizziness  Syncope, unspecified syncope type   Patient presents as outlined above. CT does not show any evidence of intracranial bleed or other significant intracranial pathology. Sinusitis is identified. With patient's recent symptoms will initiate Augmentin and Zyrtec-D. No chest pain or signs of ischemia. Patient is advised of return precautions.  New Prescriptions Discharge Medication List as of 04/22/2017  4:53 PM    START taking these medications   Details  amoxicillin-clavulanate (AUGMENTIN) 875-125 MG tablet Take 1 tablet by mouth 2 (two) times daily. One po bid x 7 days, Starting Mon 04/22/2017, Print    !! cetirizine-pseudoephedrine (ZYRTEC-D) 5-120 MG tablet Take 1 tablet by mouth daily., Starting Mon 04/22/2017, Print     !! - Potential duplicate medications found. Please discuss with provider.       Charlesetta Shanks, MD 04/28/17 [phone]

## 2017-04-22 NOTE — ED Triage Notes (Signed)
Pt from home with a near syncopal episode pt got light headed and lower himself to the ground pt has had this happen before and he was dehydrated. Pt got 560mL fluid with EMS

## 2017-04-24 ENCOUNTER — Telehealth: Payer: Self-pay | Admitting: Endocrinology

## 2017-04-24 NOTE — Telephone Encounter (Signed)
I contacted the patient and advised of message. Patient agreed to coming in at 85 in [place] on 04/29/2017.  Urban Gibson, could you please add the patient? Thanks!

## 2017-04-24 NOTE — Telephone Encounter (Signed)
7:45 am, 04/29/17

## 2017-04-24 NOTE — Telephone Encounter (Signed)
I contacted the patient. He stated on 04/29/2017 in the afternoon he will be leaving to go out of town for a cruise. He wanted to know if we could open up a slot for him at 745 or 8 am that morning for him to be seen before he leaves? I advised the patient Dr. Loanne Drilling is currently out of town. Patient voiced understanding, but requested the message to be sent to confirm if this could be done.

## 2017-04-24 NOTE — Telephone Encounter (Signed)
Patient attempting to contact you. Asking for return phone call when available. Did not going into any detail.

## 2017-04-26 DIAGNOSIS — J3089 Other allergic rhinitis: Secondary | ICD-10-CM | POA: Diagnosis not present

## 2017-04-26 DIAGNOSIS — J301 Allergic rhinitis due to pollen: Secondary | ICD-10-CM | POA: Diagnosis not present

## 2017-04-29 ENCOUNTER — Ambulatory Visit (INDEPENDENT_AMBULATORY_CARE_PROVIDER_SITE_OTHER): Payer: Medicare Other | Admitting: Endocrinology

## 2017-04-29 ENCOUNTER — Encounter: Payer: Self-pay | Admitting: Endocrinology

## 2017-04-29 VITALS — BP 122/70 | HR 67 | Ht 74.0 in | Wt 209.0 lb

## 2017-04-29 DIAGNOSIS — D649 Anemia, unspecified: Secondary | ICD-10-CM

## 2017-04-29 DIAGNOSIS — E119 Type 2 diabetes mellitus without complications: Secondary | ICD-10-CM

## 2017-04-29 DIAGNOSIS — E673 Hypervitaminosis D: Secondary | ICD-10-CM | POA: Insufficient documentation

## 2017-04-29 LAB — CBC WITH DIFFERENTIAL/PLATELET
BASOS PCT: 0.6 % (ref 0.0–3.0)
Basophils Absolute: 0.1 10*3/uL (ref 0.0–0.1)
Eosinophils Absolute: 0.3 10*3/uL (ref 0.0–0.7)
Eosinophils Relative: 3.9 % (ref 0.0–5.0)
HEMATOCRIT: 36.9 % — AB (ref 39.0–52.0)
Hemoglobin: 12.7 g/dL — ABNORMAL LOW (ref 13.0–17.0)
LYMPHS PCT: 31.6 % (ref 12.0–46.0)
Lymphs Abs: 2.6 10*3/uL (ref 0.7–4.0)
MCHC: 34.4 g/dL (ref 30.0–36.0)
MCV: 87.7 fl (ref 78.0–100.0)
MONOS PCT: 11.1 % (ref 3.0–12.0)
Monocytes Absolute: 0.9 10*3/uL (ref 0.1–1.0)
Neutro Abs: 4.3 10*3/uL (ref 1.4–7.7)
Neutrophils Relative %: 52.8 % (ref 43.0–77.0)
PLATELETS: 232 10*3/uL (ref 150.0–400.0)
RBC: 4.21 Mil/uL — ABNORMAL LOW (ref 4.22–5.81)
RDW: 14.4 % (ref 11.5–15.5)
WBC: 8.1 10*3/uL (ref 4.0–10.5)

## 2017-04-29 LAB — VITAMIN D 25 HYDROXY (VIT D DEFICIENCY, FRACTURES): VITD: 106.32 ng/mL (ref 30.00–100.00)

## 2017-04-29 LAB — IBC PANEL
Iron: 74 ug/dL (ref 42–165)
Saturation Ratios: 29 % (ref 20.0–50.0)
Transferrin: 182 mg/dL — ABNORMAL LOW (ref 212.0–360.0)

## 2017-04-29 LAB — POCT GLYCOSYLATED HEMOGLOBIN (HGB A1C): Hemoglobin A1C: 6.9

## 2017-04-29 NOTE — Patient Instructions (Addendum)
For your upcoming trip: Stand up slowly, and use your cane. Also, if you have dizziness, take an extra fludrocortisone pill that day, and skip furosemide that day.  blood tests are requested for you today.  We'll let you know about the results. I'll see you next time.

## 2017-04-29 NOTE — Progress Notes (Signed)
Subjective:    Patient ID: Joseph Rocha, male    DOB: [birth year], 82 y.o.   MRN: [id number]  HPI  Pt was seen in ER last week for dizziness, and was found to have sinusitis.  He was rx'ed abx, and he feels better now.    he denies BRBPR.  He takes fe 1/day.  Denies cramps. Past Medical History:  Diagnosis Date  . Allergy   . Anemia   . Diabetes mellitus   . Elevated PSA   . Hyperlipidemia   . Hypertension   . Hypogonadism male   . OA (osteoarthritis)   . Pituitary abnormality ([place]) 2004    Past Surgical History:  Procedure Laterality Date  . DOPPLER ECHOCARDIOGRAPHY  12/04/2001  . ELECTROCARDIOGRAM  03/25/2006  . EP IMPLANTABLE DEVICE N/A 03/16/2016   Procedure: Loop Recorder Insertion;  Surgeon: Deboraha Sprang, MD;  Location: [facility];  Service: Cardiovascular;  Laterality: N/A;  . KNEE ARTHROSCOPY Left   . LUMBAR DISC SURGERY    . MENISCUS REPAIR Right may 2015  . TONSILLECTOMY    . TRANSPHENOIDAL / TRANSNASAL HYPOPHYSECTOMY / RESECTION PITUITARY TUMOR  2005    Social History   Social History  . Marital status: Married    Spouse name: N/A  . Number of children: 2  . Years of education: N/A   Occupational History  . retired Retired   Social History Main Topics  . Smoking status: Former Smoker    Quit date: 02/22/1957  . Smokeless tobacco: Never Used  . Alcohol use No  . Drug use: No  . Sexual activity: Not Currently   Other Topics Concern  . Not on file   Social History Narrative   Lives with wife.  They have two grown children.   He is retired from the [employer].    Current Outpatient Prescriptions on File Prior to Visit  Medication Sig Dispense Refill  . calcium carbonate (OS-CAL) 600 MG TABS Take 600 mg by mouth daily.    . cetirizine-pseudoephedrine (ZYRTEC-D) 5-120 MG tablet Take 1 tablet by mouth daily. 90 tablet 2  . EPINEPHrine 0.3 mg/0.3 mL IJ SOAJ injection Inject 0.3 mg into the muscle once as needed (allergic reaction).   0   . finasteride (PROSCAR) 5 MG tablet Take 5 mg by mouth daily.      . fish oil-omega-3 fatty acids 1000 MG capsule Take 1 g by mouth daily.    . fludrocortisone (FLORINEF) 0.1 MG tablet Take 1 tablet (0.1 mg total) by mouth daily. 90 tablet 2  . folic acid (FOLVITE) 1 MG tablet Take 1 tablet (1 mg total) by mouth daily. 90 tablet 2  . furosemide (LASIX) 20 MG tablet take 1 tablet by mouth once daily 30 tablet 3  . KLOR-CON M20 20 MEQ tablet Take 20 mEq by mouth daily.     Marland Kitchen levothyroxine (SYNTHROID, LEVOTHROID) 50 MCG tablet Take 1 tablet (50 mcg total) by mouth daily. 90 tablet 2  . magnesium oxide (MAG-OX) 400 MG tablet Take 0.5 tablets (200 mg total) by mouth daily. 45 tablet 2  . meloxicam (MOBIC) 15 MG tablet Take 1 tablet (15 mg total) by mouth daily. (Patient taking differently: Take 15 mg by mouth daily as needed for pain. ) 90 tablet 2  . methocarbamol (ROBAXIN) 500 MG tablet Take 1 tablet (500 mg total) by mouth at bedtime as needed for muscle spasms. 90 tablet 2  . Multiple Vitamins-Minerals (CENTRUM SILVER PO) Take  1 tablet by mouth daily.      Marland Kitchen omeprazole (PRILOSEC) 40 MG capsule Take 1 capsule (40 mg total) by mouth daily. 90 capsule 2  . ondansetron (ZOFRAN) 4 MG tablet Take 1 tablet (4 mg total) by mouth every 8 (eight) hours as needed for nausea or vomiting. 20 tablet 0  . polyethylene glycol powder (GLYCOLAX/MIRALAX) powder take 17GM (DISSOLVED IN WATER) by mouth once daily (Patient taking differently: take 17GM (DISSOLVED IN WATER) by mouth every other day) 255 g 5  . rosuvastatin (CRESTOR) 5 MG tablet TAKE 1 TABLET DAILY 90 tablet 1  . Tamsulosin HCl (FLOMAX) 0.4 MG CAPS Take 0.4 mg by mouth daily.      . Testosterone (AXIRON) 30 MG/ACT SOLN Place 1 each onto the skin daily.     . traMADol-acetaminophen (ULTRACET) 37.5-325 MG tablet Take 1 tablet by mouth every 6 (six) hours as needed for moderate pain. 30 tablet 0   No current facility-administered medications on file prior  to visit.     Allergies  Allergen Reactions  . Lipitor [Atorvastatin] Other (See Comments)    Myalgias, cramps in hand    Family History  Problem Relation Age of Onset  . Cancer Brother        uncertain type  . Diabetes Mellitus II Mother        Deceased, 80s  . Heart attack Father   . Healthy Daughter     BP 122/70   Pulse 67   Ht 6\' 2"  (1.88 m)   Wt 209 lb (94.8 kg)   SpO2 97%   BMI 26.83 kg/m   Review of Systems Denies fever, hematuria, and numbness.      Objective:   Physical Exam VITAL SIGNS:  See vs page GENERAL: no distress head: no deformity  eyes: no periorbital swelling, no proptosis  external nose and ears are normal  mouth: no lesion seen. Ears: both eac's and tm's are normal.   Gait: normal and steady CV: no leg edema.  Skin: no ecchymoses are seen      Assessment & Plan:  Hypotension, improved Anemia: due for recheck. Vit-D deficiency: due for recheck   Patient Instructions  For your upcoming trip: Stand up slowly, and use your cane. Also, if you have dizziness, take an extra fludrocortisone pill that day, and skip furosemide that day.  blood tests are requested for you today.  We'll let you know about the results. I'll see you next time.

## 2017-04-30 LAB — PTH, INTACT AND CALCIUM
CALCIUM: 9.5 mg/dL (ref 8.6–10.3)
PTH: 27 pg/mL (ref 14–64)

## 2017-05-09 ENCOUNTER — Encounter: Payer: Self-pay | Admitting: Endocrinology

## 2017-05-09 ENCOUNTER — Ambulatory Visit (INDEPENDENT_AMBULATORY_CARE_PROVIDER_SITE_OTHER): Payer: Medicare Other | Admitting: Endocrinology

## 2017-05-09 ENCOUNTER — Ambulatory Visit: Payer: Medicare Other | Admitting: Endocrinology

## 2017-05-09 VITALS — BP 132/82 | HR 61 | Ht 74.0 in | Wt 206.0 lb

## 2017-05-09 DIAGNOSIS — R42 Dizziness and giddiness: Secondary | ICD-10-CM

## 2017-05-09 DIAGNOSIS — J301 Allergic rhinitis due to pollen: Secondary | ICD-10-CM | POA: Diagnosis not present

## 2017-05-09 DIAGNOSIS — J3089 Other allergic rhinitis: Secondary | ICD-10-CM | POA: Diagnosis not present

## 2017-05-09 MED ORDER — CETIRIZINE-PSEUDOEPHEDRINE ER 5-120 MG PO TB12: 1.0000 | ORAL_TABLET | Freq: Every day | ORAL | 2 refills | Status: DC

## 2017-05-09 MED ORDER — CEPHALEXIN 500 MG PO CAPS: 500.0000 mg | ORAL_CAPSULE | Freq: Three times a day (TID) | ORAL | 0 refills | Status: DC

## 2017-05-09 NOTE — Patient Instructions (Addendum)
I have sent 2 prescriptions to your pharmacy: for Zyrtec-D, and for an antibiotic. You can stop taking the furosemide. I'll see you next time.

## 2017-05-09 NOTE — Progress Notes (Signed)
Subjective:    Patient ID: Joseph Rocha, male    DOB: [birth year], 82 y.o.   MRN: [id number]  HPI Pt states 1 month of slight congestion in the nose, and assoc rhinorrhea Denies dizziness and LOC.  Past Medical History:  Diagnosis Date  . Allergy   . Anemia   . Diabetes mellitus   . Elevated PSA   . Hyperlipidemia   . Hypertension   . Hypogonadism male   . OA (osteoarthritis)   . Pituitary abnormality ([place]) 2004    Past Surgical History:  Procedure Laterality Date  . DOPPLER ECHOCARDIOGRAPHY  12/04/2001  . ELECTROCARDIOGRAM  03/25/2006  . EP IMPLANTABLE DEVICE N/A 03/16/2016   Procedure: Loop Recorder Insertion;  Surgeon: Deboraha Sprang, MD;  Location: [facility];  Service: Cardiovascular;  Laterality: N/A;  . KNEE ARTHROSCOPY Left   . LUMBAR DISC SURGERY    . MENISCUS REPAIR Right may 2015  . TONSILLECTOMY    . TRANSPHENOIDAL / TRANSNASAL HYPOPHYSECTOMY / RESECTION PITUITARY TUMOR  2005    Social History   Social History  . Marital status: Married    Spouse name: N/A  . Number of children: 2  . Years of education: N/A   Occupational History  . retired Retired   Social History Main Topics  . Smoking status: Former Smoker    Quit date: 02/22/1957  . Smokeless tobacco: Never Used  . Alcohol use No  . Drug use: No  . Sexual activity: Not Currently   Other Topics Concern  . Not on file   Social History Narrative   Lives with wife.  They have two grown children.   He is retired from the [employer].    Current Outpatient Prescriptions on File Prior to Visit  Medication Sig Dispense Refill  . calcium carbonate (OS-CAL) 600 MG TABS Take 600 mg by mouth daily.    Marland Kitchen EPINEPHrine 0.3 mg/0.3 mL IJ SOAJ injection Inject 0.3 mg into the muscle once as needed (allergic reaction).   0  . finasteride (PROSCAR) 5 MG tablet Take 5 mg by mouth daily.      . fish oil-omega-3 fatty acids 1000 MG capsule Take 1 g by mouth daily.    . fludrocortisone (FLORINEF)  0.1 MG tablet Take 1 tablet (0.1 mg total) by mouth daily. 90 tablet 2  . folic acid (FOLVITE) 1 MG tablet Take 1 tablet (1 mg total) by mouth daily. 90 tablet 2  . KLOR-CON M20 20 MEQ tablet Take 20 mEq by mouth daily.     Marland Kitchen levothyroxine (SYNTHROID, LEVOTHROID) 50 MCG tablet Take 1 tablet (50 mcg total) by mouth daily. 90 tablet 2  . magnesium oxide (MAG-OX) 400 MG tablet Take 0.5 tablets (200 mg total) by mouth daily. 45 tablet 2  . meloxicam (MOBIC) 15 MG tablet Take 1 tablet (15 mg total) by mouth daily. (Patient taking differently: Take 15 mg by mouth daily as needed for pain. ) 90 tablet 2  . methocarbamol (ROBAXIN) 500 MG tablet Take 1 tablet (500 mg total) by mouth at bedtime as needed for muscle spasms. 90 tablet 2  . Multiple Vitamins-Minerals (CENTRUM SILVER PO) Take 1 tablet by mouth daily.      Marland Kitchen omeprazole (PRILOSEC) 40 MG capsule Take 1 capsule (40 mg total) by mouth daily. 90 capsule 2  . ondansetron (ZOFRAN) 4 MG tablet Take 1 tablet (4 mg total) by mouth every 8 (eight) hours as needed for nausea or vomiting. 20 tablet  0  . polyethylene glycol powder (GLYCOLAX/MIRALAX) powder take 17GM (DISSOLVED IN WATER) by mouth once daily (Patient taking differently: take 17GM (DISSOLVED IN WATER) by mouth every other day) 255 g 5  . rosuvastatin (CRESTOR) 5 MG tablet TAKE 1 TABLET DAILY 90 tablet 1  . Tamsulosin HCl (FLOMAX) 0.4 MG CAPS Take 0.4 mg by mouth daily.      . Testosterone (AXIRON) 30 MG/ACT SOLN Place 1 each onto the skin daily.     . traMADol-acetaminophen (ULTRACET) 37.5-325 MG tablet Take 1 tablet by mouth every 6 (six) hours as needed for moderate pain. 30 tablet 0   No current facility-administered medications on file prior to visit.     Allergies  Allergen Reactions  . Lipitor [Atorvastatin] Other (See Comments)    Myalgias, cramps in hand    Family History  Problem Relation Age of Onset  . Cancer Brother        uncertain type  . Diabetes Mellitus II Mother         Deceased, 88s  . Heart attack Father   . Healthy Daughter     BP 132/82   Pulse 61   Ht 6\' 2"  (1.88 m)   Wt 206 lb (93.4 kg)   SpO2 97%   BMI 26.45 kg/m   Review of Systems Denies fever and earache.     Objective:   Physical Exam VITAL SIGNS:  See vs page GENERAL: no distress head: no deformity.   eyes: no periorbital swelling, no proptosis.   external nose and ears are normal  mouth: no lesion seen.   Both eac's and tm's are normal.       Assessment & Plan:  Hypotension, improved. URI: new  Patient Instructions  I have sent 2 prescriptions to your pharmacy: for Zyrtec-D, and for an antibiotic. You can stop taking the furosemide. I'll see you next time.

## 2017-05-10 ENCOUNTER — Ambulatory Visit (INDEPENDENT_AMBULATORY_CARE_PROVIDER_SITE_OTHER): Payer: Medicare Other | Admitting: *Deleted

## 2017-05-10 DIAGNOSIS — R55 Syncope and collapse: Secondary | ICD-10-CM | POA: Diagnosis not present

## 2017-05-10 NOTE — Progress Notes (Signed)
[facility] Summary Report / Loop Recorder 

## 2017-05-16 LAB — CUP PACEART REMOTE DEVICE CHECK
Implantable Pulse Generator Implant Date: 20170512
MDC IDC SESS DTM: 20180706142542

## 2017-05-17 DIAGNOSIS — J3089 Other allergic rhinitis: Secondary | ICD-10-CM | POA: Diagnosis not present

## 2017-05-17 DIAGNOSIS — J301 Allergic rhinitis due to pollen: Secondary | ICD-10-CM | POA: Diagnosis not present

## 2017-05-21 DIAGNOSIS — J3089 Other allergic rhinitis: Secondary | ICD-10-CM | POA: Diagnosis not present

## 2017-05-21 DIAGNOSIS — J301 Allergic rhinitis due to pollen: Secondary | ICD-10-CM | POA: Diagnosis not present

## 2017-05-24 DIAGNOSIS — J301 Allergic rhinitis due to pollen: Secondary | ICD-10-CM | POA: Diagnosis not present

## 2017-05-24 DIAGNOSIS — J019 Acute sinusitis, unspecified: Secondary | ICD-10-CM | POA: Diagnosis not present

## 2017-05-24 DIAGNOSIS — J3089 Other allergic rhinitis: Secondary | ICD-10-CM | POA: Diagnosis not present

## 2017-06-05 DIAGNOSIS — J301 Allergic rhinitis due to pollen: Secondary | ICD-10-CM | POA: Diagnosis not present

## 2017-06-05 DIAGNOSIS — J3089 Other allergic rhinitis: Secondary | ICD-10-CM | POA: Diagnosis not present

## 2017-06-10 ENCOUNTER — Ambulatory Visit (INDEPENDENT_AMBULATORY_CARE_PROVIDER_SITE_OTHER): Payer: Medicare Other | Admitting: *Deleted

## 2017-06-10 DIAGNOSIS — R55 Syncope and collapse: Secondary | ICD-10-CM | POA: Diagnosis not present

## 2017-06-11 NOTE — Progress Notes (Signed)
[facility] Summary Report / Loop Recorder 

## 2017-06-13 DIAGNOSIS — J301 Allergic rhinitis due to pollen: Secondary | ICD-10-CM | POA: Diagnosis not present

## 2017-06-13 DIAGNOSIS — J3089 Other allergic rhinitis: Secondary | ICD-10-CM | POA: Diagnosis not present

## 2017-06-19 DIAGNOSIS — J3089 Other allergic rhinitis: Secondary | ICD-10-CM | POA: Diagnosis not present

## 2017-06-19 DIAGNOSIS — J301 Allergic rhinitis due to pollen: Secondary | ICD-10-CM | POA: Diagnosis not present

## 2017-06-19 LAB — CUP PACEART REMOTE DEVICE CHECK
Date Time Interrogation Session: 20180805143956
MDC IDC PG IMPLANT DT: 20170512

## 2017-06-24 ENCOUNTER — Other Ambulatory Visit: Payer: Self-pay

## 2017-06-24 ENCOUNTER — Other Ambulatory Visit: Payer: Self-pay | Admitting: Endocrinology

## 2017-06-24 ENCOUNTER — Telehealth: Payer: Self-pay | Admitting: Endocrinology

## 2017-06-24 MED ORDER — LEVOTHYROXINE SODIUM 50 MCG PO TABS: 50.0000 ug | ORAL_TABLET | Freq: Every day | ORAL | 2 refills | Status: DC

## 2017-06-24 NOTE — Telephone Encounter (Signed)
MEDICATION: levothyroxine (SYNTHROID, LEVOTHROID) 50 MCG tablet  PHARMACY: [id number] [place],  - [place]    IS THIS A 90 DAY SUPPLY : yes  IS PATIENT OUT OF MEDICTAION: yes  IF NOT; HOW MUCH IS LEFT: n/a  LAST APPOINTMENT DATE:05/09/17  NEXT APPOINTMENT DATE:9/11  OTHER COMMENTS: [facility] possible due to going out of town   **Let patient know to contact pharmacy at the end of the day to make sure medication is ready. **  ** Please notify patient to allow 48-72 hours to process**  **Encourage patient to contact the pharmacy for refills or they can request refills through [facility]**

## 2017-06-24 NOTE — Telephone Encounter (Signed)
Just wanted to let you know that I have done this one in case you have in your basket. Thanks!

## 2017-06-24 NOTE — Telephone Encounter (Signed)
Called patient and left a voice message that his [facility] has [facility] for [facility].

## 2017-06-26 DIAGNOSIS — J3089 Other allergic rhinitis: Secondary | ICD-10-CM | POA: Diagnosis not present

## 2017-06-26 DIAGNOSIS — J301 Allergic rhinitis due to pollen: Secondary | ICD-10-CM | POA: Diagnosis not present

## 2017-07-09 ENCOUNTER — Ambulatory Visit (INDEPENDENT_AMBULATORY_CARE_PROVIDER_SITE_OTHER): Payer: Medicare Other | Admitting: *Deleted

## 2017-07-09 DIAGNOSIS — R55 Syncope and collapse: Secondary | ICD-10-CM

## 2017-07-10 DIAGNOSIS — J3089 Other allergic rhinitis: Secondary | ICD-10-CM | POA: Diagnosis not present

## 2017-07-10 DIAGNOSIS — J301 Allergic rhinitis due to pollen: Secondary | ICD-10-CM | POA: Diagnosis not present

## 2017-07-10 NOTE — Progress Notes (Signed)
[facility] Summary Report / Loop Recorder 

## 2017-07-11 LAB — CUP PACEART REMOTE DEVICE CHECK
Implantable Pulse Generator Implant Date: 20170512
MDC IDC SESS DTM: 20180904170231

## 2017-07-12 DIAGNOSIS — E291 Testicular hypofunction: Secondary | ICD-10-CM | POA: Diagnosis not present

## 2017-07-12 DIAGNOSIS — R972 Elevated prostate specific antigen [PSA]: Secondary | ICD-10-CM | POA: Diagnosis not present

## 2017-07-12 DIAGNOSIS — Z125 Encounter for screening for malignant neoplasm of prostate: Secondary | ICD-10-CM | POA: Diagnosis not present

## 2017-07-16 ENCOUNTER — Encounter: Payer: Self-pay | Admitting: Endocrinology

## 2017-07-16 ENCOUNTER — Other Ambulatory Visit (INDEPENDENT_AMBULATORY_CARE_PROVIDER_SITE_OTHER): Payer: Medicare Other

## 2017-07-16 ENCOUNTER — Ambulatory Visit (INDEPENDENT_AMBULATORY_CARE_PROVIDER_SITE_OTHER): Payer: Medicare Other | Admitting: Endocrinology

## 2017-07-16 VITALS — BP 124/78 | HR 64 | Wt 211.6 lb

## 2017-07-16 DIAGNOSIS — E119 Type 2 diabetes mellitus without complications: Secondary | ICD-10-CM

## 2017-07-16 DIAGNOSIS — D649 Anemia, unspecified: Secondary | ICD-10-CM | POA: Diagnosis not present

## 2017-07-16 DIAGNOSIS — R634 Abnormal weight loss: Secondary | ICD-10-CM

## 2017-07-16 DIAGNOSIS — J301 Allergic rhinitis due to pollen: Secondary | ICD-10-CM | POA: Diagnosis not present

## 2017-07-16 DIAGNOSIS — J3089 Other allergic rhinitis: Secondary | ICD-10-CM | POA: Diagnosis not present

## 2017-07-16 LAB — CBC WITH DIFFERENTIAL/PLATELET
BASOS PCT: 1.2 % (ref 0.0–3.0)
Basophils Absolute: 0.1 10*3/uL (ref 0.0–0.1)
EOS ABS: 0.2 10*3/uL (ref 0.0–0.7)
EOS PCT: 3.7 % (ref 0.0–5.0)
HCT: 36.3 % — ABNORMAL LOW (ref 39.0–52.0)
Hemoglobin: 12.2 g/dL — ABNORMAL LOW (ref 13.0–17.0)
Lymphocytes Relative: 38.5 % (ref 12.0–46.0)
Lymphs Abs: 2.6 10*3/uL (ref 0.7–4.0)
MCHC: 33.6 g/dL (ref 30.0–36.0)
MCV: 90.5 fl (ref 78.0–100.0)
Monocytes Absolute: 0.7 10*3/uL (ref 0.1–1.0)
Monocytes Relative: 11.1 % (ref 3.0–12.0)
NEUTROS ABS: 3 10*3/uL (ref 1.4–7.7)
Neutrophils Relative %: 45.5 % (ref 43.0–77.0)
Platelets: 207 10*3/uL (ref 150.0–400.0)
RBC: 4.02 Mil/uL — ABNORMAL LOW (ref 4.22–5.81)
RDW: 14.3 % (ref 11.5–15.5)
WBC: 6.6 10*3/uL (ref 4.0–10.5)

## 2017-07-16 LAB — HEPATIC FUNCTION PANEL
ALBUMIN: 3.8 g/dL (ref 3.5–5.2)
ALK PHOS: 66 U/L (ref 39–117)
ALT: 15 U/L (ref 0–53)
AST: 20 U/L (ref 0–37)
BILIRUBIN DIRECT: 0.1 mg/dL (ref 0.0–0.3)
BILIRUBIN TOTAL: 0.5 mg/dL (ref 0.2–1.2)
TOTAL PROTEIN: 6.7 g/dL (ref 6.0–8.3)

## 2017-07-16 LAB — TSH: TSH: 1.8 u[IU]/mL (ref 0.35–4.50)

## 2017-07-16 LAB — BASIC METABOLIC PANEL
BUN: 24 mg/dL — ABNORMAL HIGH (ref 6–23)
CHLORIDE: 101 meq/L (ref 96–112)
CO2: 27 mEq/L (ref 19–32)
CREATININE: 1.23 mg/dL (ref 0.40–1.50)
Calcium: 9.6 mg/dL (ref 8.4–10.5)
GFR: 72.73 mL/min (ref >60.00)
Glucose, Bld: 124 mg/dL — ABNORMAL HIGH (ref 70–99)
Potassium: 4.2 mEq/L (ref 3.5–5.1)
Sodium: 135 mEq/L (ref 135–145)

## 2017-07-16 LAB — IBC PANEL
IRON: 65 ug/dL (ref 42–165)
Saturation Ratios: 23.7 % (ref 20.0–50.0)
Transferrin: 196 mg/dL — ABNORMAL LOW (ref 212.0–360.0)

## 2017-07-16 LAB — POCT GLYCOSYLATED HEMOGLOBIN (HGB A1C): HEMOGLOBIN A1C: 7.2

## 2017-07-16 LAB — AMYLASE: Amylase: 51 U/L (ref 27–131)

## 2017-07-16 NOTE — Progress Notes (Signed)
Subjective:    Patient ID: Joseph Rocha, male    DOB: Dec 24, 1934, 82 y.o.   MRN: [id number]  HPI Pt states few months of moderate (20 lb) weight loss, but no assoc n/v.  No abd pain.   Past Medical History:  Diagnosis Date  . Allergy   . Anemia   . Diabetes mellitus   . Elevated PSA   . Hyperlipidemia   . Hypertension   . Hypogonadism male   . OA (osteoarthritis)   . 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 DOPPLER ECHOCARDIOGRAPHY  12/04/2001  . ELECTROCARDIOGRAM  03/25/2006  . EP IMPLANTABLE DEVICE N/A 03/16/2016   Procedure: Loop Recorder Insertion;  Surgeon: Deboraha Sprang, MD;  Location: [facility];  Service: Cardiovascular;  Laterality: N/A;  . KNEE ARTHROSCOPY Left   . LUMBAR DISC SURGERY    . MENISCUS REPAIR Right may 2015  . TONSILLECTOMY    . TRANSPHENOIDAL / TRANSNASAL HYPOPHYSECTOMY / RESECTION PITUITARY TUMOR  2005    Social History   Social History  . Marital status: Married    Spouse name: N/A  . Number of children: 2  . Years of education: N/A   Occupational History  . retired Retired   Social History Main Topics  . Smoking status: Former Smoker    Quit date: 02/22/1957  . Smokeless tobacco: Never Used  . Alcohol use No  . Drug use: No  . Sexual activity: Not Currently   Other Topics Concern  . Not on file   Social History Narrative   Lives with wife.  They have two grown children.   He is retired from the [employer].    Current Outpatient Prescriptions on File Prior to Visit  Medication Sig Dispense Refill  . calcium carbonate (OS-CAL) 600 MG TABS Take 600 mg by mouth daily.    . cetirizine-pseudoephedrine (ZYRTEC-D) 5-120 MG tablet Take 1 tablet by mouth daily. 90 tablet 2  . EPINEPHrine 0.3 mg/0.3 mL IJ SOAJ injection Inject 0.3 mg into the muscle once as needed (allergic reaction).   0  . finasteride (PROSCAR) 5 MG tablet Take 5 mg by mouth daily.      . fish oil-omega-3  fatty acids 1000 MG capsule Take 1 g by mouth daily.    . fludrocortisone (FLORINEF) 0.1 MG tablet Take 1 tablet (0.1 mg total) by mouth daily. 90 tablet 2  . folic acid (FOLVITE) 1 MG tablet TAKE 1 TABLET DAILY 90 tablet 2  . IRON PO Take by mouth.    Marland Kitchen KLOR-CON M20 20 MEQ tablet Take 20 mEq by mouth daily.     Marland Kitchen levothyroxine (SYNTHROID, LEVOTHROID) 50 MCG tablet Take 1 tablet (50 mcg total) by mouth daily. 90 tablet 2  . magnesium oxide (MAG-OX) 400 MG tablet Take 0.5 tablets (200 mg total) by mouth daily. 45 tablet 2  . meloxicam (MOBIC) 15 MG tablet Take 1 tablet (15 mg total) by mouth daily. (Patient taking differently: Take 15 mg by mouth daily as needed for pain. ) 90 tablet 2  . methocarbamol (ROBAXIN) 500 MG tablet Take 1 tablet (500 mg total) by mouth at bedtime as needed for muscle spasms. 90 tablet 2  . Multiple Vitamins-Minerals (CENTRUM SILVER PO) Take 1 tablet by mouth daily.      Marland Kitchen omeprazole (PRILOSEC) 40 MG capsule Take 1 capsule (40 mg total) by mouth daily. 90 capsule 2  . ondansetron (ZOFRAN) 4  MG tablet Take 1 tablet (4 mg total) by mouth every 8 (eight) hours as needed for nausea or vomiting. 20 tablet 0  . polyethylene glycol powder (GLYCOLAX/MIRALAX) powder take 17GM (DISSOLVED IN WATER) by mouth once daily (Patient taking differently: take 17GM (DISSOLVED IN WATER) by mouth every other day) 255 g 5  . rosuvastatin (CRESTOR) 5 MG tablet TAKE 1 TABLET DAILY 90 tablet 1  . Tamsulosin HCl (FLOMAX) 0.4 MG CAPS Take 0.4 mg by mouth daily.      . Testosterone (AXIRON) 30 MG/ACT SOLN Place 1 each onto the skin daily.     . traMADol-acetaminophen (ULTRACET) 37.5-325 MG tablet Take 1 tablet by mouth every 6 (six) hours as needed for moderate pain. 30 tablet 0   No current facility-administered medications on file prior to visit.     Allergies  Allergen Reactions  . Lipitor [Atorvastatin] Other (See Comments)    Myalgias, cramps in hand    Family History  Problem Relation  Age of Onset  . Cancer Brother        uncertain type  . Diabetes Mellitus II Mother        Deceased, 54s  . Heart attack Father   . Healthy Daughter     BP 124/78   Pulse 64   Wt 211 lb 9.6 oz (96 kg)   SpO2 95%   BMI 27.17 kg/m    Review of Systems Denies dysphagia and difficulty with chewing.     Objective:   Physical Exam VS: see vs page GEN: no distress HEAD: head: no deformity eyes: no periorbital swelling, no proptosis external nose and ears are normal CHEST WALL: no deformity LUNGS: clear to auscultation.  CV: reg rate and rhythm, no murmur.  ABD: abdomen is soft, nontender.  no hepatosplenomegaly.  not distended.  no hernia MUSCULOSKELETAL: muscle bulk and strength are grossly normal.  no obvious joint swelling.  gait is steady with a cane.  EXTEMITIES: no deformity.  no edema.  PULSES: no carotid bruit NEURO:  cn 2-12 grossly intact.   readily moves all 4's.  sensation is intact to touch on all 4's.  SKIN:  Normal texture and temperature.  No rash or suspicious lesion is visible.   NODES:  None palpable at the neck.  PSYCH: alert, well-oriented.  Does not appear anxious nor depressed.    Lab Results  Component Value Date   HGBA1C 7.2 07/16/2017      Assessment & Plan:  Weight loss, new, uncertain etiology Type 2 DM: well-controlled.  On no medication.  We'll follow.  Patient Instructions  blood tests are requested for you today.  We'll let you know about the results. Please come back for a follow-up appointment in 1 month.  We'll follow the weight.

## 2017-07-16 NOTE — Patient Instructions (Signed)
blood tests are requested for you today.  We'll let you know about the results. Please come back for a follow-up appointment in 1 month.  We'll follow the weight.

## 2017-07-24 DIAGNOSIS — J3089 Other allergic rhinitis: Secondary | ICD-10-CM | POA: Diagnosis not present

## 2017-07-24 DIAGNOSIS — J301 Allergic rhinitis due to pollen: Secondary | ICD-10-CM | POA: Diagnosis not present

## 2017-07-26 ENCOUNTER — Other Ambulatory Visit: Payer: Self-pay | Admitting: Gastroenterology

## 2017-07-29 ENCOUNTER — Other Ambulatory Visit: Payer: Self-pay | Admitting: Endocrinology

## 2017-07-30 DIAGNOSIS — J301 Allergic rhinitis due to pollen: Secondary | ICD-10-CM | POA: Diagnosis not present

## 2017-07-30 DIAGNOSIS — J3089 Other allergic rhinitis: Secondary | ICD-10-CM | POA: Diagnosis not present

## 2017-08-06 DIAGNOSIS — J3089 Other allergic rhinitis: Secondary | ICD-10-CM | POA: Diagnosis not present

## 2017-08-06 DIAGNOSIS — J301 Allergic rhinitis due to pollen: Secondary | ICD-10-CM | POA: Diagnosis not present

## 2017-08-08 ENCOUNTER — Ambulatory Visit (INDEPENDENT_AMBULATORY_CARE_PROVIDER_SITE_OTHER): Payer: Medicare Other | Admitting: *Deleted

## 2017-08-08 DIAGNOSIS — R55 Syncope and collapse: Secondary | ICD-10-CM

## 2017-08-08 NOTE — Progress Notes (Signed)
[facility] Summary Report / Loop Recorder 

## 2017-08-09 LAB — CUP PACEART REMOTE DEVICE CHECK
Date Time Interrogation Session: 20181004161103
Implantable Pulse Generator Implant Date: 20170512

## 2017-08-12 DIAGNOSIS — M542 Cervicalgia: Secondary | ICD-10-CM | POA: Diagnosis not present

## 2017-08-12 DIAGNOSIS — M4802 Spinal stenosis, cervical region: Secondary | ICD-10-CM | POA: Diagnosis not present

## 2017-08-12 DIAGNOSIS — M5412 Radiculopathy, cervical region: Secondary | ICD-10-CM | POA: Diagnosis not present

## 2017-08-12 DIAGNOSIS — R030 Elevated blood-pressure reading, without diagnosis of hypertension: Secondary | ICD-10-CM | POA: Diagnosis not present

## 2017-08-12 DIAGNOSIS — Z6826 Body mass index (BMI) 26.0-26.9, adult: Secondary | ICD-10-CM | POA: Diagnosis not present

## 2017-08-13 DIAGNOSIS — J3089 Other allergic rhinitis: Secondary | ICD-10-CM | POA: Diagnosis not present

## 2017-08-13 DIAGNOSIS — J301 Allergic rhinitis due to pollen: Secondary | ICD-10-CM | POA: Diagnosis not present

## 2017-08-16 ENCOUNTER — Encounter: Payer: Self-pay | Admitting: Endocrinology

## 2017-08-16 ENCOUNTER — Ambulatory Visit (INDEPENDENT_AMBULATORY_CARE_PROVIDER_SITE_OTHER): Payer: Medicare Other | Admitting: Endocrinology

## 2017-08-16 DIAGNOSIS — Z23 Encounter for immunization: Secondary | ICD-10-CM

## 2017-08-16 DIAGNOSIS — R109 Unspecified abdominal pain: Secondary | ICD-10-CM

## 2017-08-16 DIAGNOSIS — Z0000 Encounter for general adult medical examination without abnormal findings: Secondary | ICD-10-CM

## 2017-08-16 NOTE — Progress Notes (Signed)
Subjective:    Patient ID: Joseph Rocha, male    DOB: 1935-06-01, 82 y.o.   MRN: [id number]  HPI Pt returns for f/u of weight loss:  No recent change.  Denies dysphagia, BRBPR, hematuria, trouble chewing food, and abd pain.   Past Medical History:  Diagnosis Date  . Allergy   . Anemia   . Diabetes mellitus   . Elevated PSA   . Hyperlipidemia   . Hypertension   . Hypogonadism male   . OA (osteoarthritis)   . 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 DOPPLER ECHOCARDIOGRAPHY  12/04/2001  . ELECTROCARDIOGRAM  03/25/2006  . EP IMPLANTABLE DEVICE N/A 03/16/2016   Procedure: Loop Recorder Insertion;  Surgeon: Deboraha Sprang, MD;  Location: [facility];  Service: Cardiovascular;  Laterality: N/A;  . KNEE ARTHROSCOPY Left   . LUMBAR DISC SURGERY    . MENISCUS REPAIR Right may 2015  . TONSILLECTOMY    . TRANSPHENOIDAL / TRANSNASAL HYPOPHYSECTOMY / RESECTION PITUITARY TUMOR  2005    Social History   Social History  . Marital status: Married    Spouse name: N/A  . Number of children: 2  . Years of education: N/A   Occupational History  . retired Retired   Social History Main Topics  . Smoking status: Former Smoker    Quit date: 02/22/1957  . Smokeless tobacco: Never Used  . Alcohol use No  . Drug use: No  . Sexual activity: Not Currently   Other Topics Concern  . Not on file   Social History Narrative   Lives with wife.  They have two grown children.   He is retired from the [employer].    Current Outpatient Prescriptions on File Prior to Visit  Medication Sig Dispense Refill  . calcium carbonate (OS-CAL) 600 MG TABS Take 600 mg by mouth daily.    . cetirizine-pseudoephedrine (ZYRTEC-D) 5-120 MG tablet Take 1 tablet by mouth daily. 90 tablet 2  . EPINEPHrine 0.3 mg/0.3 mL IJ SOAJ injection Inject 0.3 mg into the muscle once as needed (allergic reaction).   0  . finasteride (PROSCAR) 5 MG tablet Take 5 mg  by mouth daily.      . fish oil-omega-3 fatty acids 1000 MG capsule Take 1 g by mouth daily.    . fludrocortisone (FLORINEF) 0.1 MG tablet Take 1 tablet (0.1 mg total) by mouth daily. 90 tablet 2  . folic acid (FOLVITE) 1 MG tablet TAKE 1 TABLET DAILY 90 tablet 2  . IRON PO Take by mouth.    Marland Kitchen KLOR-CON M20 20 MEQ tablet Take 20 mEq by mouth daily.     Marland Kitchen levothyroxine (SYNTHROID, LEVOTHROID) 50 MCG tablet Take 1 tablet (50 mcg total) by mouth daily. 90 tablet 2  . magnesium oxide (MAG-OX) 400 MG tablet Take 0.5 tablets (200 mg total) by mouth daily. 45 tablet 2  . meloxicam (MOBIC) 15 MG tablet Take 1 tablet (15 mg total) by mouth daily. (Patient taking differently: Take 15 mg by mouth daily as needed for pain. ) 90 tablet 2  . methocarbamol (ROBAXIN) 500 MG tablet TAKE 1 TABLET AT BEDTIME ASNEEDED FOR MUSCLE SPASM 90 tablet 2  . Multiple Vitamins-Minerals (CENTRUM SILVER PO) Take 1 tablet by mouth daily.      Marland Kitchen omeprazole (PRILOSEC) 40 MG capsule Take 1 capsule (40 mg total) by mouth daily. 90 capsule 2  . ondansetron (ZOFRAN) 4 MG tablet  Take 1 tablet (4 mg total) by mouth every 8 (eight) hours as needed for nausea or vomiting. 20 tablet 0  . polyethylene glycol powder (GLYCOLAX/MIRALAX) powder take 17GM (DISSOLVED IN WATER) by mouth once daily 255 g 5  . rosuvastatin (CRESTOR) 5 MG tablet TAKE 1 TABLET DAILY 90 tablet 1  . Tamsulosin HCl (FLOMAX) 0.4 MG CAPS Take 0.4 mg by mouth daily.      . Testosterone (AXIRON) 30 MG/ACT SOLN Place 1 each onto the skin daily.     . traMADol-acetaminophen (ULTRACET) 37.5-325 MG tablet Take 1 tablet by mouth every 6 (six) hours as needed for moderate pain. 30 tablet 0   No current facility-administered medications on file prior to visit.     Allergies  Allergen Reactions  . Lipitor [Atorvastatin] Other (See Comments)    Myalgias, cramps in hand    Family History  Problem Relation Age of Onset  . Cancer Brother        uncertain type  . Diabetes  Mellitus II Mother        Deceased, 21s  . Heart attack Father   . Healthy Daughter    BP 122/70   Pulse 61   Wt 210 lb (95.3 kg)   SpO2 97%   BMI 26.96 kg/m   Review of Systems No n/v.     Objective:   Physical Exam VITAL SIGNS:  See vs page GENERAL: no distress ABDOMEN: abdomen is soft, nontender.  no hepatosplenomegaly.  not distended.  no hernia.      Assessment & Plan:  Weight loss: only slightly more as of now.  We discussed.  We'll hold off on any further w/u for now. Postural hypotension: stable     Subjective:   Patient here for Medicare annual wellness visit and management of other chronic and acute problems.     Risk factors: advanced age    1 of Physicians Providing Medical Care to Patient:  See "snapshot"   Activities of Daily Living: In your present state of health, do you have any difficulty performing the following activities (lives with wife)?:  Preparing food and eating?: No  Bathing yourself: No  Getting dressed: No  Using the toilet:No  Moving around from place to place: No  In the past year have you fallen or had a near fall?: No    Home Safety: Has smoke detector and wears seat belts. No firearms.   Opioid Use: none   Diet and Exercise  Current exercise habits: pt says good Dietary issues discussed: pt reports a healthy diet   Depression Screen  Q1: Over the past two weeks, have you felt down, depressed or hopeless? no  Q2: Over the past two weeks, have you felt little interest or pleasure in doing things? no   The following portions of the patient's history were reviewed and updated as appropriate: allergies, current medications, past family history, past medical history, past social history, past surgical history and problem list.   Review of Systems  Denies hearing loss, and visual loss.  Objective:   Vision:  Hearing: grossly normal Body mass index:  See vs page Msk: pt easily and quickly performs "get-up-and-go" from a  sitting position.   Cognitive Impairment Assessment: cognition, memory and judgment appear normal.  remembers 2/3 at 5 minutes (? effort).  excellent recall.  can easily read and write a sentence.  alert and oriented x 3.     Assessment:   Medicare wellness utd on preventive parameters.  Plan:   During the course of the visit the patient was educated and counseled about appropriate screening and preventive services including:        Fall prevention is advised today.   Diabetes screening  Nutrition counseling is offered.    Vaccines are updated as needed.  Patient Instructions (the written plan) was given to the patient.

## 2017-08-16 NOTE — Patient Instructions (Signed)
good diet and exercise significantly improves your health.  please let me know if you wish to be referred to a dietician.  high blood sugar is very risky to your health.  you should see an eye doctor and dentist every year.  It is very important to get all recommended vaccinations.  Please consider these measures for your health:  minimize alcohol.  Do not use tobacco products.  Have a colonoscopy at least every 10 years from age 80. Keep firearms safely stored.  Always use seat belts.  have working smoke alarms in your home.  See an eye doctor and dentist regularly.  Never drive under the influence of alcohol or drugs (including prescription drugs).   please let me know what your wishes would be, if artificial life support measures should become necessary.  It is critically important to prevent falling down (keep floor areas well-lit, dry, and free of loose objects.  If you have a cane, walker, or wheelchair, you should use it, even for short trips around the house.  Wear flat-soled shoes.  Also, try not to rush).  Please come back for a follow-up appointment in 6 months.

## 2017-08-16 NOTE — Progress Notes (Signed)
we discussed code status.  pt requests full code, but would not want to be started or maintained on artificial life-support measures if there was not a reasonable chance of recovery 

## 2017-08-19 ENCOUNTER — Other Ambulatory Visit: Payer: Self-pay | Admitting: Endocrinology

## 2017-08-20 DIAGNOSIS — J301 Allergic rhinitis due to pollen: Secondary | ICD-10-CM | POA: Diagnosis not present

## 2017-08-20 DIAGNOSIS — J3089 Other allergic rhinitis: Secondary | ICD-10-CM | POA: Diagnosis not present

## 2017-08-28 DIAGNOSIS — J3089 Other allergic rhinitis: Secondary | ICD-10-CM | POA: Diagnosis not present

## 2017-08-28 DIAGNOSIS — J301 Allergic rhinitis due to pollen: Secondary | ICD-10-CM | POA: Diagnosis not present

## 2017-09-09 ENCOUNTER — Ambulatory Visit (INDEPENDENT_AMBULATORY_CARE_PROVIDER_SITE_OTHER): Payer: Medicare Other | Admitting: *Deleted

## 2017-09-09 DIAGNOSIS — R55 Syncope and collapse: Secondary | ICD-10-CM

## 2017-09-09 NOTE — Progress Notes (Signed)
[facility] Summary Report / Loop Recorder 

## 2017-09-10 LAB — CUP PACEART REMOTE DEVICE CHECK
Implantable Pulse Generator Implant Date: 20170512
MDC IDC SESS DTM: 20181103184446

## 2017-09-11 DIAGNOSIS — J3089 Other allergic rhinitis: Secondary | ICD-10-CM | POA: Diagnosis not present

## 2017-09-11 DIAGNOSIS — J301 Allergic rhinitis due to pollen: Secondary | ICD-10-CM | POA: Diagnosis not present

## 2017-09-18 DIAGNOSIS — J3089 Other allergic rhinitis: Secondary | ICD-10-CM | POA: Diagnosis not present

## 2017-09-18 DIAGNOSIS — J301 Allergic rhinitis due to pollen: Secondary | ICD-10-CM | POA: Diagnosis not present

## 2017-09-24 DIAGNOSIS — J301 Allergic rhinitis due to pollen: Secondary | ICD-10-CM | POA: Diagnosis not present

## 2017-09-24 DIAGNOSIS — J3089 Other allergic rhinitis: Secondary | ICD-10-CM | POA: Diagnosis not present

## 2017-10-01 ENCOUNTER — Other Ambulatory Visit: Payer: Self-pay

## 2017-10-01 ENCOUNTER — Telehealth: Payer: Self-pay | Admitting: Endocrinology

## 2017-10-01 ENCOUNTER — Encounter: Payer: Self-pay | Admitting: Endocrinology

## 2017-10-01 ENCOUNTER — Ambulatory Visit (INDEPENDENT_AMBULATORY_CARE_PROVIDER_SITE_OTHER): Payer: Medicare Other | Admitting: Endocrinology

## 2017-10-01 DIAGNOSIS — J209 Acute bronchitis, unspecified: Secondary | ICD-10-CM | POA: Diagnosis not present

## 2017-10-01 MED ORDER — CEFUROXIME AXETIL 250 MG PO TABS: 250.0000 mg | ORAL_TABLET | Freq: Two times a day (BID) | ORAL | 0 refills | Status: AC

## 2017-10-01 MED ORDER — PROMETHAZINE-CODEINE 6.25-10 MG/5ML PO SYRP: 5.0000 mL | ORAL_SOLUTION | ORAL | 0 refills | Status: DC | PRN

## 2017-10-01 NOTE — Telephone Encounter (Signed)
Patient dropped off prescription for cough syrup at pharmacy. Pharmacy said he has to bring in a separate prescription because of codeine. It cannot be called in. Is there a different cough syrup without codeine so he does not have to come in to get a written prescription. He did not have any problems getting cough syrup last time. Please call patient at ph# [phone] to let him know status.

## 2017-10-01 NOTE — Progress Notes (Signed)
Subjective:    Patient ID: Joseph Rocha, male    DOB: 23-Oct-1935, 82 y.o.   MRN: [id number]  HPI Pt states 4 days of moderate prod-quality cough in the chest, and assoc nasal congestion.   Past Medical History:  Diagnosis Date  . Allergy   . Anemia   . Diabetes mellitus   . Elevated PSA   . Hyperlipidemia   . Hypertension   . Hypogonadism male   . OA (osteoarthritis)   . 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 DOPPLER ECHOCARDIOGRAPHY  12/04/2001  . ELECTROCARDIOGRAM  03/25/2006  . EP IMPLANTABLE DEVICE N/A 03/16/2016   Procedure: Loop Recorder Insertion;  Surgeon: Deboraha Sprang, MD;  Location: [facility];  Service: Cardiovascular;  Laterality: N/A;  . KNEE ARTHROSCOPY Left   . LUMBAR DISC SURGERY    . MENISCUS REPAIR Right may 2015  . TONSILLECTOMY    . TRANSPHENOIDAL / TRANSNASAL HYPOPHYSECTOMY / RESECTION PITUITARY TUMOR  2005    Social History   Socioeconomic History  . Marital status: Married    Spouse name: Not on file  . Number of children: 2  . Years of education: Not on file  . Highest education level: Not on file  Social Needs  . Financial resource strain: Not on file  . Food insecurity - worry: Not on file  . Food insecurity - inability: Not on file  . Transportation needs - medical: Not on file  . Transportation needs - non-medical: Not on file  Occupational History  . Occupation: retired    Fish farm manager: RETIRED  Tobacco Use  . Smoking status: Former Smoker    Last attempt to quit: 02/22/1957    Years since quitting: 60.6  . Smokeless tobacco: Never Used  Substance and Sexual Activity  . Alcohol use: No  . Drug use: No  . Sexual activity: Not Currently  Other Topics Concern  . Not on file  Social History Narrative   Lives with wife.  They have two grown children.   He is retired from the [employer].    Current Outpatient Medications on File Prior to Visit  Medication Sig Dispense  Refill  . calcium carbonate (OS-CAL) 600 MG TABS Take 600 mg by mouth daily.    . cetirizine-pseudoephedrine (ZYRTEC-D) 5-120 MG tablet Take 1 tablet by mouth daily. 90 tablet 2  . EPINEPHrine 0.3 mg/0.3 mL IJ SOAJ injection Inject 0.3 mg into the muscle once as needed (allergic reaction).   0  . fish oil-omega-3 fatty acids 1000 MG capsule Take 1 g by mouth daily.    . fludrocortisone (FLORINEF) 0.1 MG tablet Take 1 tablet (0.1 mg total) by mouth daily. 90 tablet 2  . folic acid (FOLVITE) 1 MG tablet TAKE 1 TABLET DAILY 90 tablet 2  . IRON PO Take by mouth.    Marland Kitchen KLOR-CON M20 20 MEQ tablet Take 20 mEq by mouth daily.     Marland Kitchen levothyroxine (SYNTHROID, LEVOTHROID) 50 MCG tablet Take 1 tablet (50 mcg total) by mouth daily. 90 tablet 2  . magnesium oxide (MAG-OX) 400 MG tablet Take 0.5 tablets (200 mg total) by mouth daily. 45 tablet 2  . meloxicam (MOBIC) 15 MG tablet Take 1 tablet (15 mg total) by mouth daily. (Patient taking differently: Take 15 mg by mouth daily as needed for pain. ) 90 tablet 2  . methocarbamol (ROBAXIN) 500 MG tablet TAKE 1 TABLET AT BEDTIME ASNEEDED FOR  MUSCLE SPASM 90 tablet 2  . Multiple Vitamins-Minerals (CENTRUM SILVER PO) Take 1 tablet by mouth daily.      Marland Kitchen omeprazole (PRILOSEC) 40 MG capsule Take 1 capsule (40 mg total) by mouth daily. 90 capsule 2  . omeprazole (PRILOSEC) 40 MG capsule take 1 capsule by mouth once daily 90 capsule 2  . ondansetron (ZOFRAN) 4 MG tablet Take 1 tablet (4 mg total) by mouth every 8 (eight) hours as needed for nausea or vomiting. 20 tablet 0  . polyethylene glycol powder (GLYCOLAX/MIRALAX) powder take 17GM (DISSOLVED IN WATER) by mouth once daily 255 g 5  . rosuvastatin (CRESTOR) 5 MG tablet TAKE 1 TABLET DAILY 90 tablet 1  . Tamsulosin HCl (FLOMAX) 0.4 MG CAPS Take 0.4 mg by mouth daily.      . Testosterone (AXIRON) 30 MG/ACT SOLN Place 1 each onto the skin daily.     . traMADol-acetaminophen (ULTRACET) 37.5-325 MG tablet Take 1 tablet by  mouth every 6 (six) hours as needed for moderate pain. 30 tablet 0   No current facility-administered medications on file prior to visit.     Allergies  Allergen Reactions  . Lipitor [Atorvastatin] Other (See Comments)    Myalgias, cramps in hand    Family History  Problem Relation Age of Onset  . Cancer Brother        uncertain type  . Diabetes Mellitus II Mother        Deceased, 41s  . Heart attack Father   . Healthy Daughter     BP 102/62 (BP Location: Left Arm, Patient Position: Sitting, Cuff Size: Normal)   Pulse 73   Wt 211 lb 3.2 oz (95.8 kg)   SpO2 96%   BMI 27.12 kg/m    Review of Systems Denies fever and sob.      Objective:   Physical Exam VITAL SIGNS:  See vs page.  GENERAL: no distress.  head: no deformity.   eyes: no periorbital swelling, no proptosis  external nose and ears are normal  mouth: no lesion seen.  Both eac's and tm's are normal.   LUNGS:  Clear to auscultation.       Assessment & Plan:  Acute bronchitis, new.   Patient Instructions  I have sent a prescription to your pharmacy, for an antibiotic pill.   Here is a prescription, for cough syrup.  I hope you feel better soon.  If you don't feel better by next week, please call back.  Please call sooner if you get worse.

## 2017-10-01 NOTE — Patient Instructions (Addendum)
I have sent a prescription to your pharmacy, for an antibiotic pill.   Here is a prescription, for cough syrup.  I hope you feel better soon.  If you don't feel better by next week, please call back.  Please call sooner if you get worse.

## 2017-10-01 NOTE — Telephone Encounter (Signed)
Please advise 

## 2017-10-01 NOTE — Telephone Encounter (Signed)
The sritten prescription should have been in with his AVS.  Did he not get it?

## 2017-10-02 ENCOUNTER — Telehealth: Payer: Self-pay

## 2017-10-02 ENCOUNTER — Other Ambulatory Visit: Payer: Self-pay

## 2017-10-02 DIAGNOSIS — J189 Pneumonia, unspecified organism: Secondary | ICD-10-CM | POA: Insufficient documentation

## 2017-10-02 DIAGNOSIS — J209 Acute bronchitis, unspecified: Secondary | ICD-10-CM | POA: Insufficient documentation

## 2017-10-02 MED ORDER — FINASTERIDE 5 MG PO TABS: 5.0000 mg | ORAL_TABLET | Freq: Every day | ORAL | 99 refills | Status: DC

## 2017-10-02 NOTE — Telephone Encounter (Signed)
Patient returning Sarah's call. Patient waiting to see if cough syrup has been called in so his wife can pick it up.Please call patient asap- at ph# [phone]

## 2017-10-02 NOTE — Telephone Encounter (Signed)
Called pharmacy & verbally gave prescription order. I also called patient to let him know.

## 2017-10-02 NOTE — Telephone Encounter (Signed)
I called patient & he did not receive prescription. I tried to call [facility] but they are not yet open so I will have to call back.

## 2017-10-02 NOTE — Telephone Encounter (Signed)
I called patient & pharmacy stating that I called in cough syrup over the phone.

## 2017-10-07 ENCOUNTER — Ambulatory Visit (INDEPENDENT_AMBULATORY_CARE_PROVIDER_SITE_OTHER): Payer: Medicare Other | Admitting: *Deleted

## 2017-10-07 DIAGNOSIS — R55 Syncope and collapse: Secondary | ICD-10-CM

## 2017-10-07 NOTE — Progress Notes (Signed)
[facility] Summary Report / Loop Recorder 

## 2017-10-09 DIAGNOSIS — J301 Allergic rhinitis due to pollen: Secondary | ICD-10-CM | POA: Diagnosis not present

## 2017-10-09 DIAGNOSIS — J3089 Other allergic rhinitis: Secondary | ICD-10-CM | POA: Diagnosis not present

## 2017-10-15 LAB — CUP PACEART REMOTE DEVICE CHECK
Date Time Interrogation Session: 20181203193915
MDC IDC PG IMPLANT DT: 20170512

## 2017-10-17 DIAGNOSIS — J3089 Other allergic rhinitis: Secondary | ICD-10-CM | POA: Diagnosis not present

## 2017-10-17 DIAGNOSIS — J301 Allergic rhinitis due to pollen: Secondary | ICD-10-CM | POA: Diagnosis not present

## 2017-10-23 DIAGNOSIS — J3089 Other allergic rhinitis: Secondary | ICD-10-CM | POA: Diagnosis not present

## 2017-10-23 DIAGNOSIS — J301 Allergic rhinitis due to pollen: Secondary | ICD-10-CM | POA: Diagnosis not present

## 2017-10-30 DIAGNOSIS — J301 Allergic rhinitis due to pollen: Secondary | ICD-10-CM | POA: Diagnosis not present

## 2017-10-30 DIAGNOSIS — J3089 Other allergic rhinitis: Secondary | ICD-10-CM | POA: Diagnosis not present

## 2017-11-06 ENCOUNTER — Ambulatory Visit (INDEPENDENT_AMBULATORY_CARE_PROVIDER_SITE_OTHER): Payer: Medicare Other | Admitting: *Deleted

## 2017-11-06 DIAGNOSIS — R55 Syncope and collapse: Secondary | ICD-10-CM | POA: Diagnosis not present

## 2017-11-07 DIAGNOSIS — J3089 Other allergic rhinitis: Secondary | ICD-10-CM | POA: Diagnosis not present

## 2017-11-07 DIAGNOSIS — J301 Allergic rhinitis due to pollen: Secondary | ICD-10-CM | POA: Diagnosis not present

## 2017-11-07 NOTE — Progress Notes (Signed)
[facility] Summary Report / Loop Recorder 

## 2017-11-13 DIAGNOSIS — J301 Allergic rhinitis due to pollen: Secondary | ICD-10-CM | POA: Diagnosis not present

## 2017-11-13 DIAGNOSIS — J3089 Other allergic rhinitis: Secondary | ICD-10-CM | POA: Diagnosis not present

## 2017-11-19 LAB — CUP PACEART REMOTE DEVICE CHECK
Date Time Interrogation Session: 20190102210717
Implantable Pulse Generator Implant Date: 20170512

## 2017-11-27 DIAGNOSIS — J3089 Other allergic rhinitis: Secondary | ICD-10-CM | POA: Diagnosis not present

## 2017-11-27 DIAGNOSIS — J301 Allergic rhinitis due to pollen: Secondary | ICD-10-CM | POA: Diagnosis not present

## 2017-11-28 ENCOUNTER — Other Ambulatory Visit: Payer: Self-pay

## 2017-11-28 MED ORDER — POLYETHYLENE GLYCOL 3350 17 GM/SCOOP PO POWD: ORAL | 0 refills | Status: DC

## 2017-12-05 ENCOUNTER — Ambulatory Visit (INDEPENDENT_AMBULATORY_CARE_PROVIDER_SITE_OTHER): Payer: Medicare Other | Admitting: Gastroenterology

## 2017-12-05 ENCOUNTER — Encounter: Payer: Self-pay | Admitting: Gastroenterology

## 2017-12-05 ENCOUNTER — Encounter (INDEPENDENT_AMBULATORY_CARE_PROVIDER_SITE_OTHER): Payer: Self-pay

## 2017-12-05 VITALS — BP 106/70 | HR 62 | Ht 75.0 in | Wt 217.0 lb

## 2017-12-05 DIAGNOSIS — J301 Allergic rhinitis due to pollen: Secondary | ICD-10-CM | POA: Diagnosis not present

## 2017-12-05 DIAGNOSIS — K5900 Constipation, unspecified: Secondary | ICD-10-CM

## 2017-12-05 DIAGNOSIS — J3089 Other allergic rhinitis: Secondary | ICD-10-CM | POA: Diagnosis not present

## 2017-12-05 NOTE — Patient Instructions (Signed)
Please call if you have not received your medication by Monday.

## 2017-12-05 NOTE — Progress Notes (Signed)
12/05/2017 Joseph Rocha [id number] June 12, 1935   HISTORY OF PRESENT ILLNESS:  This is a pleasant 83 year old male who is known to Joseph Rocha for treatment of constipation and GERD.  Was last seen here close to one year ago.  He called in here for a refill on his miralax prescription last week and a prescription was sent, but he was told that he needed an office visit as well.  When he was last seen here in 12/2016 Joseph Rocha said that he could follow-up as needed.  He has not yet received his medication.  He is doing well.  No new complaints.  He is taking the [name] about every day and moving his bowels well.  No abdominal pain or rectal bleeding.   Past Medical History:  Diagnosis Date  . Allergy   . Anemia   . Diabetes mellitus   . Elevated PSA   . Hyperlipidemia   . Hypertension   . Hypogonadism male   . OA (osteoarthritis)   . 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 DOPPLER ECHOCARDIOGRAPHY  12/04/2001  . ELECTROCARDIOGRAM  03/25/2006  . EP IMPLANTABLE DEVICE N/A 03/16/2016   Procedure: Loop Recorder Insertion;  Surgeon: Joseph Sprang, MD;  Location: [facility];  Service: Cardiovascular;  Laterality: N/A;  . KNEE ARTHROSCOPY Left   . LUMBAR DISC SURGERY    . MENISCUS REPAIR Right may 2015  . TONSILLECTOMY    . TRANSPHENOIDAL / TRANSNASAL HYPOPHYSECTOMY / RESECTION PITUITARY TUMOR  2005    reports that he quit smoking about 60 years ago. he has never used smokeless tobacco. He reports that he does not drink alcohol or use drugs. family history includes Cancer in his brother; Diabetes Mellitus II in his mother; Healthy in his daughter; Heart attack in his father. Allergies  Allergen Reactions  . Lipitor [Atorvastatin] Other (See Comments)    Myalgias, cramps in hand      Outpatient Encounter Medications as of 12/05/2017  Medication Sig  . calcium carbonate (OS-CAL) 600 MG TABS Take 600 mg by mouth daily.  .  cetirizine-pseudoephedrine (ZYRTEC-D) 5-120 MG tablet Take 1 tablet by mouth daily.  Marland Kitchen EPINEPHrine 0.3 mg/0.3 mL IJ SOAJ injection Inject 0.3 mg into the muscle once as needed (allergic reaction).   . finasteride (PROSCAR) 5 MG tablet Take 1 tablet (5 mg total) by mouth daily.  . fish oil-omega-3 fatty acids 1000 MG capsule Take 1 g by mouth daily.  . fludrocortisone (FLORINEF) 0.1 MG tablet Take 1 tablet (0.1 mg total) by mouth daily.  . folic acid (FOLVITE) 1 MG tablet TAKE 1 TABLET DAILY  . IRON PO Take by mouth.  Marland Kitchen KLOR-CON M20 20 MEQ tablet Take 20 mEq by mouth daily.   Marland Kitchen levothyroxine (SYNTHROID, LEVOTHROID) 50 MCG tablet Take 1 tablet (50 mcg total) by mouth daily.  . magnesium oxide (MAG-OX) 400 MG tablet Take 0.5 tablets (200 mg total) by mouth daily.  . meloxicam (MOBIC) 15 MG tablet Take 1 tablet (15 mg total) by mouth daily. (Patient taking differently: Take 15 mg by mouth daily as needed for pain. )  . methocarbamol (ROBAXIN) 500 MG tablet TAKE 1 TABLET AT BEDTIME ASNEEDED FOR MUSCLE SPASM  . Multiple Vitamins-Minerals (CENTRUM SILVER PO) Take 1 tablet by mouth daily.    Marland Kitchen omeprazole (PRILOSEC) 40 MG capsule take 1 capsule by mouth once daily  . ondansetron (ZOFRAN) 4 MG tablet Take  1 tablet (4 mg total) by mouth every 8 (eight) hours as needed for nausea or vomiting.  . polyethylene glycol powder (GLYCOLAX/MIRALAX) powder take 17GM (DISSOLVED IN WATER) by mouth once daily  . promethazine-codeine (PHENERGAN WITH CODEINE) 6.25-10 MG/5ML syrup Take 5 mLs by mouth every 4 (four) hours as needed.  . rosuvastatin (CRESTOR) 5 MG tablet TAKE 1 TABLET DAILY  . Tamsulosin HCl (FLOMAX) 0.4 MG CAPS Take 0.4 mg by mouth daily.    . Testosterone (AXIRON) 30 MG/ACT SOLN Place 1 each onto the skin daily.   . traMADol-acetaminophen (ULTRACET) 37.5-325 MG tablet Take 1 tablet by mouth every 6 (six) hours as needed for moderate pain.  . [DISCONTINUED] omeprazole (PRILOSEC) 40 MG capsule Take 1  capsule (40 mg total) by mouth daily. (Patient not taking: Reported on 12/05/2017)   No facility-administered encounter medications on file as of 12/05/2017.      REVIEW OF SYSTEMS  : All other systems reviewed and negative except where noted in the History of Present Illness.   PHYSICAL EXAM: BP 106/70   Pulse 62   Ht 6\' 3"  (1.905 m)   Wt 217 lb (98.4 kg)   BMI 27.12 kg/m  General: Well developed male in no acute distress Head: Normocephalic and atraumatic Eyes:  Sclerae anicteric, conjunctiva pink. Ears: Normal auditory acuity Lungs: Clear throughout to auscultation; no increased WOB. Heart: Regular rate and rhythm; no M/R/G. Abdomen: Soft, non-distended.  BS present.  Non-tender. Musculoskeletal: Symmetrical with no gross deformities  Skin: No lesions on visible extremities Extremities: No edema  Neurological: Alert oriented x 4, grossly non-focal Psychological:  Alert and cooperative. Normal mood and affect  ASSESSMENT AND Rocha: *Constipation:  Well-controlled on miralax daily.  Refill for 90 day supply was sent last week.  He will call back next week if he has not yet received it.  He can consider asking his PCP for refills in the future since he is seen there regularly.   CC:  Joseph Shin, MD

## 2017-12-05 NOTE — Progress Notes (Signed)
Reviewed and agree with management plan.  Armari Fussell T. Shyloh Derosa, MD FACG 

## 2017-12-06 ENCOUNTER — Ambulatory Visit (INDEPENDENT_AMBULATORY_CARE_PROVIDER_SITE_OTHER): Payer: Medicare Other | Admitting: *Deleted

## 2017-12-06 DIAGNOSIS — R55 Syncope and collapse: Secondary | ICD-10-CM | POA: Diagnosis not present

## 2017-12-09 NOTE — Progress Notes (Signed)
[facility] Summary Report / Loop Recorder 

## 2017-12-10 LAB — CUP PACEART REMOTE DEVICE CHECK
Date Time Interrogation Session: 20190201213955
MDC IDC PG IMPLANT DT: 20170512

## 2017-12-11 DIAGNOSIS — J3089 Other allergic rhinitis: Secondary | ICD-10-CM | POA: Diagnosis not present

## 2017-12-11 DIAGNOSIS — J301 Allergic rhinitis due to pollen: Secondary | ICD-10-CM | POA: Diagnosis not present

## 2017-12-17 ENCOUNTER — Telehealth: Payer: Self-pay | Admitting: Endocrinology

## 2017-12-17 NOTE — Telephone Encounter (Signed)
Pt stated [facility] is [facility] to be [facility] over a paper for Dr to fill out for pt to receive  polyethylene glycol.       [facility] [place], [place] AT [facility] to [facility]

## 2017-12-18 NOTE — Telephone Encounter (Signed)
LVM that we have received paperwork & I was putting paper on Dr. Cordelia Pen desk to be signed.

## 2017-12-19 DIAGNOSIS — J3089 Other allergic rhinitis: Secondary | ICD-10-CM | POA: Diagnosis not present

## 2017-12-19 DIAGNOSIS — J301 Allergic rhinitis due to pollen: Secondary | ICD-10-CM | POA: Diagnosis not present

## 2017-12-23 ENCOUNTER — Ambulatory Visit: Payer: Medicare Other | Admitting: Endocrinology

## 2017-12-23 ENCOUNTER — Other Ambulatory Visit: Payer: Self-pay

## 2017-12-23 NOTE — Telephone Encounter (Signed)
Patient stated he is having problems with his stomach, (diarrhea)  He may have the stomach bug do he need to come in or Could you send him something to his pharmacy. Please advise

## 2017-12-23 NOTE — Telephone Encounter (Signed)
Ov now 

## 2017-12-23 NOTE — Telephone Encounter (Signed)
I called patient & he said that he was just having some diarrhea not too long after eating meals. I suggested a walking clinic since I wasn't able to add him to our schedule, but he declined that. He stated that he hadn't tried anything OTC & he would try that first to see if that relieved his symptom. He stated other than the stomach upset he didn't;t feel bad.

## 2017-12-31 ENCOUNTER — Telehealth: Payer: Self-pay | Admitting: Endocrinology

## 2017-12-31 MED ORDER — POLYETHYLENE GLYCOL 3350 17 GM/SCOOP PO POWD: ORAL | 0 refills | Status: DC

## 2017-12-31 NOTE — Telephone Encounter (Signed)
Refill submitted and patient notified.

## 2017-12-31 NOTE — Telephone Encounter (Signed)
°  Pt stated he spoke with pharmacy and they have not recieved this prescription . Please resend to pharmacy.   Pt would like a call back once this has been done. Thanks!    polyethylene glycol powder (GLYCOLAX/MIRALAX) powder     [facility] [place], [place] to [facility]

## 2017-12-31 NOTE — Telephone Encounter (Signed)
See message and please advise if we can send. Rx is listed under another providers name at this time.

## 2017-12-31 NOTE — Telephone Encounter (Signed)
Please refill prn 

## 2018-01-01 DIAGNOSIS — J301 Allergic rhinitis due to pollen: Secondary | ICD-10-CM | POA: Diagnosis not present

## 2018-01-01 DIAGNOSIS — J3089 Other allergic rhinitis: Secondary | ICD-10-CM | POA: Diagnosis not present

## 2018-01-02 DIAGNOSIS — J3089 Other allergic rhinitis: Secondary | ICD-10-CM | POA: Diagnosis not present

## 2018-01-02 DIAGNOSIS — J301 Allergic rhinitis due to pollen: Secondary | ICD-10-CM | POA: Diagnosis not present

## 2018-01-05 DIAGNOSIS — S199XXA Unspecified injury of neck, initial encounter: Secondary | ICD-10-CM | POA: Diagnosis not present

## 2018-01-05 DIAGNOSIS — N183 Chronic kidney disease, stage 3 unspecified: Secondary | ICD-10-CM | POA: Insufficient documentation

## 2018-01-05 DIAGNOSIS — S0101XA Laceration without foreign body of scalp, initial encounter: Secondary | ICD-10-CM | POA: Diagnosis not present

## 2018-01-05 DIAGNOSIS — R197 Diarrhea, unspecified: Secondary | ICD-10-CM | POA: Diagnosis not present

## 2018-01-05 DIAGNOSIS — K529 Noninfective gastroenteritis and colitis, unspecified: Secondary | ICD-10-CM | POA: Diagnosis not present

## 2018-01-05 DIAGNOSIS — I6523 Occlusion and stenosis of bilateral carotid arteries: Secondary | ICD-10-CM | POA: Diagnosis not present

## 2018-01-05 DIAGNOSIS — R531 Weakness: Secondary | ICD-10-CM | POA: Diagnosis not present

## 2018-01-05 DIAGNOSIS — R1110 Vomiting, unspecified: Secondary | ICD-10-CM | POA: Diagnosis not present

## 2018-01-05 DIAGNOSIS — Z87891 Personal history of nicotine dependence: Secondary | ICD-10-CM | POA: Diagnosis not present

## 2018-01-05 DIAGNOSIS — I959 Hypotension, unspecified: Secondary | ICD-10-CM | POA: Diagnosis not present

## 2018-01-05 DIAGNOSIS — I4891 Unspecified atrial fibrillation: Secondary | ICD-10-CM | POA: Diagnosis not present

## 2018-01-05 DIAGNOSIS — R55 Syncope and collapse: Secondary | ICD-10-CM | POA: Diagnosis not present

## 2018-01-05 DIAGNOSIS — Z79899 Other long term (current) drug therapy: Secondary | ICD-10-CM | POA: Diagnosis not present

## 2018-01-05 DIAGNOSIS — S060X0A Concussion without loss of consciousness, initial encounter: Secondary | ICD-10-CM | POA: Diagnosis not present

## 2018-01-05 DIAGNOSIS — Z7982 Long term (current) use of aspirin: Secondary | ICD-10-CM | POA: Diagnosis not present

## 2018-01-05 DIAGNOSIS — E1122 Type 2 diabetes mellitus with diabetic chronic kidney disease: Secondary | ICD-10-CM | POA: Diagnosis not present

## 2018-01-06 DIAGNOSIS — I77810 Thoracic aortic ectasia: Secondary | ICD-10-CM | POA: Diagnosis not present

## 2018-01-06 DIAGNOSIS — I081 Rheumatic disorders of both mitral and tricuspid valves: Secondary | ICD-10-CM | POA: Diagnosis not present

## 2018-01-06 DIAGNOSIS — S0990XA Unspecified injury of head, initial encounter: Secondary | ICD-10-CM | POA: Diagnosis not present

## 2018-01-06 DIAGNOSIS — K529 Noninfective gastroenteritis and colitis, unspecified: Secondary | ICD-10-CM | POA: Diagnosis not present

## 2018-01-06 DIAGNOSIS — N183 Chronic kidney disease, stage 3 (moderate): Secondary | ICD-10-CM | POA: Diagnosis not present

## 2018-01-06 DIAGNOSIS — I4891 Unspecified atrial fibrillation: Secondary | ICD-10-CM | POA: Diagnosis not present

## 2018-01-06 DIAGNOSIS — R197 Diarrhea, unspecified: Secondary | ICD-10-CM | POA: Diagnosis not present

## 2018-01-06 DIAGNOSIS — R55 Syncope and collapse: Secondary | ICD-10-CM | POA: Diagnosis not present

## 2018-01-06 DIAGNOSIS — R270 Ataxia, unspecified: Secondary | ICD-10-CM | POA: Diagnosis not present

## 2018-01-07 ENCOUNTER — Telehealth: Payer: Self-pay | Admitting: *Deleted

## 2018-01-07 DIAGNOSIS — E291 Testicular hypofunction: Secondary | ICD-10-CM | POA: Diagnosis not present

## 2018-01-07 DIAGNOSIS — N401 Enlarged prostate with lower urinary tract symptoms: Secondary | ICD-10-CM | POA: Diagnosis not present

## 2018-01-07 DIAGNOSIS — Z125 Encounter for screening for malignant neoplasm of prostate: Secondary | ICD-10-CM | POA: Diagnosis not present

## 2018-01-07 DIAGNOSIS — R948 Abnormal results of function studies of other organs and systems: Secondary | ICD-10-CM | POA: Diagnosis not present

## 2018-01-07 DIAGNOSIS — R351 Nocturia: Secondary | ICD-10-CM | POA: Diagnosis not present

## 2018-01-07 MED ORDER — ASPIRIN 81 MG PO CHEW
81.00 | CHEWABLE_TABLET | ORAL | Status: DC
Start: 2018-01-07 — End: 2018-01-07

## 2018-01-07 MED ORDER — PANTOPRAZOLE SODIUM 40 MG PO TBEC
40.00 | DELAYED_RELEASE_TABLET | ORAL | Status: DC
Start: 2018-01-07 — End: 2018-01-07

## 2018-01-07 MED ORDER — FERROUS SULFATE 325 (65 FE) MG PO TABS
325.00 | ORAL_TABLET | ORAL | Status: DC
Start: 2018-01-07 — End: 2018-01-07

## 2018-01-07 MED ORDER — APIXABAN 5 MG PO TABS
5.00 | ORAL_TABLET | ORAL | Status: DC
Start: 2018-01-06 — End: 2018-01-07

## 2018-01-07 MED ORDER — LOPERAMIDE HCL 2 MG PO CAPS
2.00 | ORAL_CAPSULE | ORAL | Status: DC
Start: ? — End: 2018-01-07

## 2018-01-07 MED ORDER — LEVOTHYROXINE SODIUM 50 MCG PO TABS
ORAL_TABLET | ORAL | Status: DC
Start: 2018-01-07 — End: 2018-01-07

## 2018-01-07 MED ORDER — GENERIC EXTERNAL MEDICATION
Status: DC
Start: ? — End: 2018-01-07

## 2018-01-07 MED ORDER — ATORVASTATIN CALCIUM 20 MG PO TABS
20.00 | ORAL_TABLET | ORAL | Status: DC
Start: 2018-01-06 — End: 2018-01-07

## 2018-01-07 MED ORDER — FISH OIL 1000 MG PO CAPS
ORAL_CAPSULE | ORAL | Status: DC
Start: 2018-01-07 — End: 2018-01-07

## 2018-01-07 MED ORDER — ALBUTEROL SULFATE (2.5 MG/3ML) 0.083% IN NEBU
2.50 | INHALATION_SOLUTION | RESPIRATORY_TRACT | Status: DC
Start: ? — End: 2018-01-07

## 2018-01-07 MED ORDER — SODIUM CHLORIDE 0.9 % IV SOLN
INTRAVENOUS | Status: DC
Start: ? — End: 2018-01-07

## 2018-01-07 MED ORDER — FOLIC ACID 1 MG PO TABS
1.00 | ORAL_TABLET | ORAL | Status: DC
Start: 2018-01-07 — End: 2018-01-07

## 2018-01-07 NOTE — Telephone Encounter (Signed)
Spoke with patient regarding sending manual transmission to review Full transmission with 3 pause episodes and AF episode on 01/05/18. Patient states around 2 am on 01/05/18 he woke up with loose stools and vomitting, passed out in the bathroom and went to [facility] in [place] with his wife. See notes in Care everywhere for hospital admission 01/05/18. Advised patient will review episodes with Dr. Caryl Comes and call patient back with any further recommendations.

## 2018-01-08 ENCOUNTER — Ambulatory Visit (INDEPENDENT_AMBULATORY_CARE_PROVIDER_SITE_OTHER): Payer: Medicare Other | Admitting: *Deleted

## 2018-01-08 DIAGNOSIS — R55 Syncope and collapse: Secondary | ICD-10-CM | POA: Diagnosis not present

## 2018-01-08 MED ORDER — GENERIC EXTERNAL MEDICATION
Status: DC
Start: ? — End: 2018-01-08

## 2018-01-08 NOTE — Telephone Encounter (Signed)
Reviewed episodes with Dr. Caryl Comes. Per Dr. Caryl Comes recurrent pauses appear vasovagal in the context of vomiting, recommends to continue to monitor, no changes at this time.

## 2018-01-09 NOTE — Progress Notes (Signed)
[facility] Summary Report / Loop Recorder 

## 2018-01-10 ENCOUNTER — Other Ambulatory Visit: Payer: Self-pay | Admitting: Internal Medicine

## 2018-01-10 ENCOUNTER — Encounter: Payer: Self-pay | Admitting: Physician Assistant

## 2018-01-10 ENCOUNTER — Ambulatory Visit (INDEPENDENT_AMBULATORY_CARE_PROVIDER_SITE_OTHER): Payer: Medicare Other | Admitting: Physician Assistant

## 2018-01-10 VITALS — BP 122/64 | HR 66 | Ht 74.0 in | Wt 215.0 lb

## 2018-01-10 DIAGNOSIS — R1084 Generalized abdominal pain: Secondary | ICD-10-CM

## 2018-01-10 DIAGNOSIS — R197 Diarrhea, unspecified: Secondary | ICD-10-CM | POA: Diagnosis not present

## 2018-01-10 DIAGNOSIS — R112 Nausea with vomiting, unspecified: Secondary | ICD-10-CM | POA: Diagnosis not present

## 2018-01-10 DIAGNOSIS — R110 Nausea: Secondary | ICD-10-CM

## 2018-01-10 DIAGNOSIS — R194 Change in bowel habit: Secondary | ICD-10-CM

## 2018-01-10 DIAGNOSIS — R109 Unspecified abdominal pain: Secondary | ICD-10-CM

## 2018-01-10 MED ORDER — LOPERAMIDE HCL 2 MG PO TABS: 2.0000 mg | ORAL_TABLET | Freq: Two times a day (BID) | ORAL | 1 refills | Status: DC | PRN

## 2018-01-10 NOTE — Patient Instructions (Addendum)
If you are age 81 or older, your body mass index should be between 23-30. Your Body mass index is 27.6 kg/m. If this is out of the aforementioned range listed, please consider follow up with your Primary Care Provider.  If you are age 5 or younger, your body mass index should be between 19-25. Your Body mass index is 27.6 kg/m. If this is out of the aformentioned range listed, please consider follow up with your Primary Care Provider.   We have sent the following medications to your pharmacy for you to pick up at your convenience: Loperamide 2 mg  You have been scheduled for a CT scan of the abdomen and pelvis at [facility] ([address]---this is in the same building as Press photographer).   You are scheduled on  01/21/18  At 8:30 am.  You should arrive 15 minutes prior to your appointment time for registration. Please follow the written instructions below on the day of your exam:  WARNING: IF YOU ARE ALLERGIC TO IODINE/X-RAY DYE, PLEASE NOTIFY RADIOLOGY IMMEDIATELY AT [phone]! YOU WILL BE GIVEN A 13 HOUR PREMEDICATION PREP.  1) Do not eat or drink anything after 4:30 am (4 hours prior to your test) 2) You have been given 2 bottles of oral contrast to drink. The solution may taste               better if refrigerated, but do NOT add ice or any other liquid to this solution. Shake             well before drinking.    Drink 1 bottle of contrast @   6:30 am     (2 hours prior to your exam)  Drink 1 bottle of contrast @   7:30 am   (1 hour prior to your exam)  You may take any medications as prescribed with a small amount of water except for the following: Metformin, Glucophage, Glucovance, Avandamet, Riomet, Fortamet, Actoplus Met, Janumet, Glumetza or Metaglip. The above medications must be held the day of the exam AND 48 hours after the exam.  The purpose of you drinking the oral contrast is to aid in the visualization of your intestinal tract. The contrast solution may  cause some diarrhea. Before your exam is started, you will be given a small amount of fluid to drink. Depending on your individual set of symptoms, you may also receive an intravenous injection of x-ray contrast/dye. Plan on being at [facility] for 30 minutes or longer, depending on the type of exam you are having performed.  This test typically takes 30-45 minutes to complete.  If you have any questions regarding your exam or if you need to reschedule, you may call the CT department at [phone] between the hours of 8:00 am and 5:00 pm, Monday-Friday.  ______________________________________________________________________  Thank you for choosing me and [facility].   Ellouise Newer, PA-C

## 2018-01-10 NOTE — Progress Notes (Signed)
Reviewed and agree with initial management plan.  Carianna Lague T. Maks Cavallero, MD FACG 

## 2018-01-10 NOTE — Progress Notes (Signed)
Chief Complaint: Loose stools  HPI:    Joseph Rocha is an 83 year old male with a past medical history as listed below, known to Dr. Fuller Plan, who presents to clinic today with a complaint of loose stools.    Office visit 12/04/17 with Joseph Bogus, PA-C for refill of his MiraLAX prescription.  He was taking the MiraLAX every day and moving his bowels well.  This was refilled.    Last Colonoscopy 12/15/10 with mild diverticulosis in sigmoid colon and internal hemorrhoids.  Repeat recommended in 10 years.    Recent hospital visit at [facility] for a fall hitting his head and diarrhea.  Found to have a magnesium low at 1.5, CMP otherwise normal.  CBC normal.  GI path panel negative.  Stool test negative including C. difficile.  CT head normal, CT spine normal, echocardiogram normal.  Diarrhea thought related to gastroenteritis, though no infectious cause found.    Today, presents to clinic accompanied by his wife who assists with history.  Apparently had a change in bowel habits about a month ago to diarrhea with at least 4-5 loose watery urgent stools daily and some vomiting, then had an episode of passing out and presented to [facility] as above.  No cause found for diarrhea but was given Imodium in the hospital which the patient tells me slowed his stools.  He got out on Monday and did not have a bowel movement until last night after eating what sounds like a greasy dinner and developing further "queasiness and gassiness" in his stomach which resulted in diarrheal bowel movements overnight and into this morning.  He has not used Imodium since returning home.  Also generalized abdominal discomfort.  Continues on daily probiotic.  Not using laxatives at this time.  Was started on magnesium oxide in the hospital.    Denies fever, chills, anorexia, heartburn or reflux.  Past Medical History:  Diagnosis Date  . Allergy   . Anemia   . Diabetes mellitus   . Elevated PSA   . Hyperlipidemia   . Hypertension   .  Hypogonadism male   . OA (osteoarthritis)   . 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 DOPPLER ECHOCARDIOGRAPHY  12/04/2001  . ELECTROCARDIOGRAM  03/25/2006  . EP IMPLANTABLE DEVICE N/A 03/16/2016   Procedure: Loop Recorder Insertion;  Surgeon: Deboraha Sprang, MD;  Location: [facility];  Service: Cardiovascular;  Laterality: N/A;  . KNEE ARTHROSCOPY Left   . LUMBAR DISC SURGERY    . MENISCUS REPAIR Right may 2015  . TONSILLECTOMY    . TRANSPHENOIDAL / TRANSNASAL HYPOPHYSECTOMY / RESECTION PITUITARY TUMOR  2005    Current Outpatient Medications  Medication Sig Dispense Refill  . calcium carbonate (OS-CAL) 600 MG TABS Take 600 mg by mouth daily.    . cetirizine-pseudoephedrine (ZYRTEC-D) 5-120 MG tablet Take 1 tablet by mouth daily. 90 tablet 2  . EPINEPHrine 0.3 mg/0.3 mL IJ SOAJ injection Inject 0.3 mg into the muscle once as needed (allergic reaction).   0  . finasteride (PROSCAR) 5 MG tablet Take 1 tablet (5 mg total) by mouth daily. 90 tablet prn  . fish oil-omega-3 fatty acids 1000 MG capsule Take 1 g by mouth daily.    . fludrocortisone (FLORINEF) 0.1 MG tablet Take 1 tablet (0.1 mg total) by mouth daily. 90 tablet 2  . folic acid (FOLVITE) 1 MG tablet TAKE 1 TABLET DAILY 90 tablet 2  . IRON PO Take  by mouth.    Marland Kitchen KLOR-CON M20 20 MEQ tablet Take 20 mEq by mouth daily.     Marland Kitchen levothyroxine (SYNTHROID, LEVOTHROID) 50 MCG tablet Take 1 tablet (50 mcg total) by mouth daily. 90 tablet 2  . magnesium oxide (MAG-OX) 400 MG tablet Take 0.5 tablets (200 mg total) by mouth daily. 45 tablet 2  . meloxicam (MOBIC) 15 MG tablet Take 1 tablet (15 mg total) by mouth daily. (Patient taking differently: Take 15 mg by mouth daily as needed for pain. ) 90 tablet 2  . methocarbamol (ROBAXIN) 500 MG tablet TAKE 1 TABLET AT BEDTIME ASNEEDED FOR MUSCLE SPASM 90 tablet 2  . Multiple Vitamins-Minerals (CENTRUM SILVER PO) Take 1 tablet by mouth daily.       Marland Kitchen omeprazole (PRILOSEC) 40 MG capsule take 1 capsule by mouth once daily 90 capsule 2  . ondansetron (ZOFRAN) 4 MG tablet Take 1 tablet (4 mg total) by mouth every 8 (eight) hours as needed for nausea or vomiting. 20 tablet 0  . polyethylene glycol powder (GLYCOLAX/MIRALAX) powder take 17GM (DISSOLVED IN WATER) by mouth once daily 850 g 0  . promethazine-codeine (PHENERGAN WITH CODEINE) 6.25-10 MG/5ML syrup Take 5 mLs by mouth every 4 (four) hours as needed. 118 mL 0  . rosuvastatin (CRESTOR) 5 MG tablet TAKE 1 TABLET DAILY 90 tablet 1  . Tamsulosin HCl (FLOMAX) 0.4 MG CAPS Take 0.4 mg by mouth daily.      . Testosterone (AXIRON) 30 MG/ACT SOLN Place 1 each onto the skin daily.     . traMADol-acetaminophen (ULTRACET) 37.5-325 MG tablet Take 1 tablet by mouth every 6 (six) hours as needed for moderate pain. 30 tablet 0   No current facility-administered medications for this visit.     Allergies as of 01/10/2018 - Review Complete 12/05/2017  Allergen Reaction Noted  . Lipitor [atorvastatin] Other (See Comments) 03/02/2014    Family History  Problem Relation Age of Onset  . Cancer Brother        uncertain type  . Diabetes Mellitus II Mother        Deceased, 28s  . Heart attack Father   . Healthy Daughter     Social History   Socioeconomic History  . Marital status: Married    Spouse name: Not on file  . Number of children: 2  . Years of education: Not on file  . Highest education level: Not on file  Social Needs  . Financial resource strain: Not on file  . Food insecurity - worry: Not on file  . Food insecurity - inability: Not on file  . Transportation needs - medical: Not on file  . Transportation needs - non-medical: Not on file  Occupational History  . Occupation: retired    Fish farm manager: RETIRED  Tobacco Use  . Smoking status: Former Smoker    Last attempt to quit: 02/22/1957    Years since quitting: 60.9  . Smokeless tobacco: Never Used  Substance and Sexual  Activity  . Alcohol use: No  . Drug use: No  . Sexual activity: Not Currently  Other Topics Concern  . Not on file  Social History Narrative   Lives with wife.  They have two grown children.   He is retired from the [employer].    Review of Systems:    Constitutional: No fever or chills Cardiovascular: No chest pain Respiratory: No SOB  Gastrointestinal: See HPI and otherwise negative   Physical Exam:  Vital signs: BP 122/64  Pulse 66   Ht 6\' 2"  (1.88 m)   Wt 215 lb (97.5 kg)   SpO2 99%   BMI 27.60 kg/m   Constitutional:   Pleasant Elderly AA male appears to be in NAD, Well developed, Well nourished, alert and cooperative Head:  Normocephalic and atraumatic. Eyes:   PEERL, EOMI. No icterus. Conjunctiva pink. Ears:  Normal auditory acuity. Neck:  Supple Throat: Oral cavity and pharynx without inflammation, swelling or lesion.  Respiratory: Respirations even and unlabored. Lungs clear to auscultation bilaterally.   No wheezes, crackles, or rhonchi.  Cardiovascular: Normal S1, S2. No MRG. Regular rate and rhythm. No peripheral edema, cyanosis or pallor.  Gastrointestinal:  Soft, nondistended,Mild epigastric ttp, No rebound or guarding. Normal bowel sounds. No appreciable masses or hepatomegaly. Rectal:  Not performed.  Msk:  Symmetrical without gross deformities. Without edema, no deformity or joint abnormality.  Neurologic:  Alert and  oriented x4;  grossly normal neurologically.  Skin:   Dry and intact without significant lesions or rashes. Psychiatric: Demonstrates good judgement and reason without abnormal affect or behaviors.  No recent labs or imaging.  Assessment: 1. Diarrhea: Multiple loose stools per day, slowed by Imodium in the hospital recently, stool studies negative; question relation to magnesium versus gastroenteritis 2. Abdominal pain: Generalized, somewhat worse in the epigastrium, typically worse after eating; consider gastroenteritis 3.  Nausea/vomiting: Continues with nausea, no further vomiting  Plan: 1.  Ordered CT abdomen pelvis with contrast for further evaluation of abdominal pain and change in bowel habits. 2.  Reviewed outside records showing negative stool studies and normal labs other than decreased magnesium. 3.  Patient is taking magnesium oxide currently, question relation to continued diarrhea at this time. 4.  Prescribed Loperamide 2 g once daily.  Did discuss that he can increase this to 2-3 times per day.  Provided prescription #30 with 1 refill 5.  Recommend the patient continue his daily probiotic. 6.  Patient to follow in clinic with Dr. Fuller Plan at his next available appointment or sooner as advised after imaging above.  Ellouise Newer, PA-C [facility] 01/10/2018, 11:13 AM  Cc: Renato Shin, MD

## 2018-01-14 ENCOUNTER — Telehealth: Payer: Self-pay | Admitting: Physician Assistant

## 2018-01-14 DIAGNOSIS — R109 Unspecified abdominal pain: Secondary | ICD-10-CM

## 2018-01-14 NOTE — Telephone Encounter (Signed)
Left message on machine to call back  

## 2018-01-15 ENCOUNTER — Other Ambulatory Visit: Payer: Self-pay

## 2018-01-15 DIAGNOSIS — J3089 Other allergic rhinitis: Secondary | ICD-10-CM | POA: Diagnosis not present

## 2018-01-15 DIAGNOSIS — J301 Allergic rhinitis due to pollen: Secondary | ICD-10-CM | POA: Diagnosis not present

## 2018-01-15 MED ORDER — MELOXICAM 15 MG PO TABS: 15.0000 mg | ORAL_TABLET | Freq: Every day | ORAL | 2 refills | Status: DC

## 2018-01-15 NOTE — Telephone Encounter (Signed)
01/31/18 Advance am arrive 1010.  830 am , 930 am  Drink contrast the pt has been advised of the date and time.

## 2018-01-21 ENCOUNTER — Inpatient Hospital Stay: Admission: RE | Admit: 2018-01-21 | Payer: Medicare Other | Source: Ambulatory Visit

## 2018-01-22 DIAGNOSIS — J301 Allergic rhinitis due to pollen: Secondary | ICD-10-CM | POA: Diagnosis not present

## 2018-01-22 DIAGNOSIS — J3089 Other allergic rhinitis: Secondary | ICD-10-CM | POA: Diagnosis not present

## 2018-01-25 ENCOUNTER — Other Ambulatory Visit: Payer: Self-pay | Admitting: Endocrinology

## 2018-01-29 DIAGNOSIS — J3089 Other allergic rhinitis: Secondary | ICD-10-CM | POA: Diagnosis not present

## 2018-01-29 DIAGNOSIS — J301 Allergic rhinitis due to pollen: Secondary | ICD-10-CM | POA: Diagnosis not present

## 2018-01-31 ENCOUNTER — Ambulatory Visit
Admission: RE | Admit: 2018-01-31 | Discharge: 2018-01-31 | Disposition: A | Discharge status: Home / Self Care | Payer: Medicare Other | Source: Ambulatory Visit | Attending: Physician Assistant | Admitting: Physician Assistant

## 2018-01-31 DIAGNOSIS — K5730 Diverticulosis of large intestine without perforation or abscess without bleeding: Secondary | ICD-10-CM | POA: Diagnosis not present

## 2018-01-31 DIAGNOSIS — R109 Unspecified abdominal pain: Secondary | ICD-10-CM

## 2018-01-31 MED ORDER — IOPAMIDOL (ISOVUE-300) INJECTION 61%
100.0000 mL | Freq: Once | INTRAVENOUS | Status: AC | PRN
  Administered 2018-01-31: 100 mL via INTRAVENOUS

## 2018-02-03 ENCOUNTER — Other Ambulatory Visit: Payer: Self-pay | Admitting: Endocrinology

## 2018-02-04 ENCOUNTER — Telehealth: Payer: Self-pay | Admitting: Physician Assistant

## 2018-02-04 NOTE — Telephone Encounter (Signed)
The pt has been advised that the report was mailed today.

## 2018-02-05 DIAGNOSIS — J301 Allergic rhinitis due to pollen: Secondary | ICD-10-CM | POA: Diagnosis not present

## 2018-02-05 DIAGNOSIS — J3089 Other allergic rhinitis: Secondary | ICD-10-CM | POA: Diagnosis not present

## 2018-02-09 LAB — GLUCOSE, POCT (MANUAL RESULT ENTRY): POC Glucose: 101 mg/dl — AB (ref 70–99)

## 2018-02-10 ENCOUNTER — Ambulatory Visit (INDEPENDENT_AMBULATORY_CARE_PROVIDER_SITE_OTHER): Payer: Medicare Other | Admitting: *Deleted

## 2018-02-10 DIAGNOSIS — R55 Syncope and collapse: Secondary | ICD-10-CM | POA: Diagnosis not present

## 2018-02-11 ENCOUNTER — Telehealth: Payer: Self-pay | Admitting: Endocrinology

## 2018-02-11 ENCOUNTER — Telehealth: Payer: Self-pay | Admitting: Gastroenterology

## 2018-02-11 NOTE — Telephone Encounter (Signed)
Left message on machine to call back  

## 2018-02-11 NOTE — Telephone Encounter (Signed)
Left message on machine returning patient's call 

## 2018-02-11 NOTE — Telephone Encounter (Signed)
Patient wants CMA/Nurse to call him at ph# [phone]. He wants to give information.

## 2018-02-11 NOTE — Progress Notes (Signed)
[facility] Summary Report / Loop Recorder 

## 2018-02-13 NOTE — Telephone Encounter (Signed)
I spoke with patient & he wanted his labs from [facility]. He comes in on Tuesday & I stated he could sign a medical release for [place] to get those then.

## 2018-02-13 NOTE — Telephone Encounter (Signed)
Left message on machine to call back will wait for further communication from the pt  

## 2018-02-14 ENCOUNTER — Ambulatory Visit: Payer: Medicare Other | Admitting: Endocrinology

## 2018-02-18 ENCOUNTER — Ambulatory Visit (INDEPENDENT_AMBULATORY_CARE_PROVIDER_SITE_OTHER): Payer: Medicare Other | Admitting: Endocrinology

## 2018-02-18 ENCOUNTER — Encounter: Payer: Self-pay | Admitting: Endocrinology

## 2018-02-18 VITALS — BP 124/72 | HR 65 | Resp 16 | Wt 212.8 lb

## 2018-02-18 DIAGNOSIS — D649 Anemia, unspecified: Secondary | ICD-10-CM | POA: Diagnosis not present

## 2018-02-18 DIAGNOSIS — J3089 Other allergic rhinitis: Secondary | ICD-10-CM | POA: Diagnosis not present

## 2018-02-18 DIAGNOSIS — R197 Diarrhea, unspecified: Secondary | ICD-10-CM | POA: Diagnosis not present

## 2018-02-18 DIAGNOSIS — E119 Type 2 diabetes mellitus without complications: Secondary | ICD-10-CM | POA: Diagnosis not present

## 2018-02-18 DIAGNOSIS — J301 Allergic rhinitis due to pollen: Secondary | ICD-10-CM | POA: Diagnosis not present

## 2018-02-18 LAB — CBC WITH DIFFERENTIAL/PLATELET
BASOS PCT: 0.6 % (ref 0.0–3.0)
Basophils Absolute: 0 10*3/uL (ref 0.0–0.1)
EOS ABS: 0.1 10*3/uL (ref 0.0–0.7)
EOS PCT: 1.9 % (ref 0.0–5.0)
HEMATOCRIT: 36.8 % — AB (ref 39.0–52.0)
HEMOGLOBIN: 12.5 g/dL — AB (ref 13.0–17.0)
LYMPHS PCT: 26.7 % (ref 12.0–46.0)
Lymphs Abs: 1.5 10*3/uL (ref 0.7–4.0)
MCHC: 33.9 g/dL (ref 30.0–36.0)
MCV: 90.4 fl (ref 78.0–100.0)
Monocytes Absolute: 0.7 10*3/uL (ref 0.1–1.0)
Monocytes Relative: 12.1 % — ABNORMAL HIGH (ref 3.0–12.0)
Neutro Abs: 3.3 10*3/uL (ref 1.4–7.7)
Neutrophils Relative %: 58.7 % (ref 43.0–77.0)
Platelets: 186 10*3/uL (ref 150.0–400.0)
RBC: 4.08 Mil/uL — ABNORMAL LOW (ref 4.22–5.81)
RDW: 13.9 % (ref 11.5–15.5)
WBC: 5.5 10*3/uL (ref 4.0–10.5)

## 2018-02-18 LAB — IBC PANEL
Iron: 60 ug/dL (ref 42–165)
Saturation Ratios: 21 % (ref 20.0–50.0)
Transferrin: 204 mg/dL — ABNORMAL LOW (ref 212.0–360.0)

## 2018-02-18 LAB — BASIC METABOLIC PANEL
BUN: 19 mg/dL (ref 6–23)
CHLORIDE: 103 meq/L (ref 96–112)
CO2: 24 mEq/L (ref 19–32)
Calcium: 8.9 mg/dL (ref 8.4–10.5)
Creatinine, Ser: 1.16 mg/dL (ref 0.40–1.50)
GFR: 77.7 mL/min (ref >60.00)
Glucose, Bld: 106 mg/dL — ABNORMAL HIGH (ref 70–99)
POTASSIUM: 4.1 meq/L (ref 3.5–5.1)
SODIUM: 131 meq/L — AB (ref 135–145)

## 2018-02-18 LAB — MAGNESIUM: Magnesium: 1.7 mg/dL (ref 1.5–2.5)

## 2018-02-18 LAB — POCT GLYCOSYLATED HEMOGLOBIN (HGB A1C): HEMOGLOBIN A1C: 7.7

## 2018-02-18 MED ORDER — FLUDROCORTISONE ACETATE 0.1 MG PO TABS: 0.1000 mg | ORAL_TABLET | Freq: Every day | ORAL | 2 refills | Status: DC

## 2018-02-18 NOTE — Patient Instructions (Addendum)
Please come back for a follow-up appointment in 6 months (after 08/16/18).   please stop taking the magnesium pill. blood tests are requested for you today.  We'll let you know about the results.

## 2018-02-18 NOTE — Progress Notes (Signed)
Subjective:    Patient ID: Joseph Rocha, male    DOB: 11-14-1934, 83 y.o.   MRN: [id number]  HPI Pt returns for f/u of diabetes mellitus: DM type: 2 Dx'ed: 0867 Complications: renal insuff Therapy: no medication now.   DKA: never Severe hypoglycemia: never Pancreatitis: never Other: he has never been on insulin Interval history: no weight change.  Renal insuff was noted at [facility] when he had n/v/syncope.  sxs quickly resolved.  Hypocalcemia was noted also.  Denies cramps.  Anemia: he denies BRBPR.  Past Medical History:  Diagnosis Date  . Allergy   . Anemia   . Diabetes mellitus   . Elevated PSA   . Hyperlipidemia   . Hypertension   . Hypogonadism male   . OA (osteoarthritis)   . 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 DOPPLER ECHOCARDIOGRAPHY  12/04/2001  . ELECTROCARDIOGRAM  03/25/2006  . EP IMPLANTABLE DEVICE N/A 03/16/2016   Procedure: Loop Recorder Insertion;  Surgeon: Deboraha Sprang, MD;  Location: [facility];  Service: Cardiovascular;  Laterality: N/A;  . KNEE ARTHROSCOPY Left   . LUMBAR DISC SURGERY    . MENISCUS REPAIR Right may 2015  . TONSILLECTOMY    . TRANSPHENOIDAL / TRANSNASAL HYPOPHYSECTOMY / RESECTION PITUITARY TUMOR  2005    Social History   Socioeconomic History  . Marital status: Married    Spouse name: Not on file  . Number of children: 2  . Years of education: Not on file  . Highest education level: Not on file  Occupational History  . Occupation: retired    Fish farm manager: RETIRED  Social Needs  . Financial resource strain: Not on file  . Food insecurity:    Worry: Not on file    Inability: Not on file  . Transportation needs:    Medical: Not on file    Non-medical: Not on file  Tobacco Use  . Smoking status: Former Smoker    Last attempt to quit: 02/22/1957    Years since quitting: 61.0  . Smokeless tobacco: Never Used  Substance and Sexual Activity  . Alcohol  use: No  . Drug use: No  . Sexual activity: Not Currently  Lifestyle  . Physical activity:    Days per week: Not on file    Minutes per session: Not on file  . Stress: Not on file  Relationships  . Social connections:    Talks on phone: Not on file    Gets together: Not on file    Attends religious service: Not on file    Active member of club or organization: Not on file    Attends meetings of clubs or organizations: Not on file    Relationship status: Not on file  . Intimate partner violence:    Fear of current or ex partner: Not on file    Emotionally abused: Not on file    Physically abused: Not on file    Forced sexual activity: Not on file  Other Topics Concern  . Not on file  Social History Narrative   Lives with wife.  They have two grown children.   He is retired from the [employer].    Current Outpatient Medications on File Prior to Visit  Medication Sig Dispense Refill  . calcium carbonate (OS-CAL) 600 MG TABS Take 600 mg by mouth daily.    . cetirizine-pseudoephedrine (ZYRTEC-D) 5-120 MG tablet Take 1 tablet by mouth daily.  90 tablet 2  . EPINEPHrine 0.3 mg/0.3 mL IJ SOAJ injection Inject 0.3 mg into the muscle once as needed (allergic reaction).   0  . finasteride (PROSCAR) 5 MG tablet Take 1 tablet (5 mg total) by mouth daily. 90 tablet prn  . fish oil-omega-3 fatty acids 1000 MG capsule Take 1 g by mouth daily.    . folic acid (FOLVITE) 1 MG tablet TAKE 1 TABLET DAILY 90 tablet 2  . IRON PO Take by mouth.    Marland Kitchen KLOR-CON M20 20 MEQ tablet Take 20 mEq by mouth daily.     Marland Kitchen levothyroxine (SYNTHROID, LEVOTHROID) 50 MCG tablet Take 1 tablet (50 mcg total) by mouth daily. 90 tablet 2  . loperamide (IMODIUM A-D) 2 MG tablet Take 1 tablet (2 mg total) by mouth 2 (two) times daily as needed for diarrhea or loose stools. 30 tablet 1  . meloxicam (MOBIC) 15 MG tablet Take 1 tablet (15 mg total) by mouth daily. 90 tablet 2  . methocarbamol (ROBAXIN) 500 MG tablet TAKE 1  TABLET AT BEDTIME ASNEEDED FOR MUSCLE SPASM 90 tablet 2  . Multiple Vitamins-Minerals (CENTRUM SILVER PO) Take 1 tablet by mouth daily.      Marland Kitchen omeprazole (PRILOSEC) 40 MG capsule take 1 capsule by mouth once daily 90 capsule 2  . rosuvastatin (CRESTOR) 5 MG tablet TAKE 1 TABLET DAILY 90 tablet 1  . SYNTHROID 50 MCG tablet TAKE 1 TABLET DAILY 90 tablet 1  . Tamsulosin HCl (FLOMAX) 0.4 MG CAPS Take 0.4 mg by mouth daily.      . Testosterone (AXIRON) 30 MG/ACT SOLN Place 1 each onto the skin daily.      No current facility-administered medications on file prior to visit.     Allergies  Allergen Reactions  . Lipitor [Atorvastatin] Other (See Comments)    Myalgias, cramps in hand    Family History  Problem Relation Age of Onset  . Cancer Brother        uncertain type  . Diabetes Mellitus II Mother        Deceased, 12s  . Heart attack Father   . Healthy Daughter     BP 124/72   Pulse 65   Resp 16   Wt 212 lb 12.8 oz (96.5 kg)   SpO2 97%   BMI 27.32 kg/m    Review of Systems Denies numbness and hematuria    Objective:   Physical Exam VITAL SIGNS:  See vs page.  GENERAL: no distress. Pulses: dorsalis pedis intact bilat.   MSK: no deformity of the feet CV: no leg edema Skin:  no ulcer on the feet.  normal color and temp on the feet. Neuro: sensation is intact to touch on the feet Ext: There is bilateral onychomycosis of the toenails  Lab Results  Component Value Date   HGBA1C 7.7 02/18/2018   Lab Results  Component Value Date   WBC 5.5 02/18/2018   HGB 12.5 (L) 02/18/2018   HCT 36.8 (L) 02/18/2018   MCV 90.4 02/18/2018   PLT 186.0 02/18/2018       Assessment & Plan:  Diarrhea: uncertain etiology Type 2 DM: worse.  Due to mult other probs and sxs, we wont add medication for this, for now.  Anemia: uncertain etiology.  We'll follow.  Renal insuff: recheck today Hypocalcemia: recheck today.   Patient Instructions  Please come back for a follow-up  appointment in 6 months (after 08/16/18).   please stop taking the magnesium pill. blood  tests are requested for you today.  We'll let you know about the results.

## 2018-02-19 LAB — PTH, INTACT AND CALCIUM
CALCIUM: 8.8 mg/dL (ref 8.6–10.3)
PTH: 48 pg/mL (ref 14–64)

## 2018-02-19 LAB — VITAMIN D 25 HYDROXY (VIT D DEFICIENCY, FRACTURES): VITD: 68.16 ng/mL (ref 30.00–100.00)

## 2018-02-20 ENCOUNTER — Ambulatory Visit (INDEPENDENT_AMBULATORY_CARE_PROVIDER_SITE_OTHER): Payer: Medicare Other | Admitting: Gastroenterology

## 2018-02-20 ENCOUNTER — Encounter: Payer: Self-pay | Admitting: Gastroenterology

## 2018-02-20 VITALS — BP 98/60 | HR 72 | Ht 75.0 in | Wt 211.5 lb

## 2018-02-20 DIAGNOSIS — R197 Diarrhea, unspecified: Secondary | ICD-10-CM

## 2018-02-20 DIAGNOSIS — R935 Abnormal findings on diagnostic imaging of other abdominal regions, including retroperitoneum: Secondary | ICD-10-CM | POA: Diagnosis not present

## 2018-02-20 LAB — CUP PACEART REMOTE DEVICE CHECK
Date Time Interrogation Session: 20190306214146
MDC IDC PG IMPLANT DT: 20170512

## 2018-02-20 NOTE — Progress Notes (Signed)
    History of Present Illness: This is an 83 year old male returning for follow-up of abdominal pain and diarrhea.  He initially had abdominal pain, nausea, vomiting and diarrhea as outlined in Ellouise Newer PA-C's office note from March 8.  Nausea, vomiting, abdominal pain resolved first and then his diarrhea resolved after discontinuing magnesium supplements.  His last colonoscopy was in February 2012 showing mild sigmoid colon diverticulosis and internal  hemorrhoids. He had questions regarding findings on his CT scan.   Abd/pelvic CT 01/2018 IMPRESSION: 1. No acute findings within the abdomen or pelvis. No bowel obstruction or evidence of bowel wall inflammation. No free fluid or abscess. No evidence of acute solid organ abnormality. 2. Colonic diverticulosis without evidence of acute diverticulitis. 3. Moderate amount of stool and gas throughout the nondistended colon. 4. Mild prostate gland enlargement. Recommend correlation with PSA lab values. 5. Scattered sclerotic foci within the osseous pelvis, of uncertain significance, possibly benign bone islands or related to adjacent degenerative change, blastic metastases not excluded but considered less likely. If PSA lab values were abnormal, would certainly consider nuclear medicine bone scan for further characterization. 6. Aortic atherosclerosis.   Current Medications, Allergies, Past Medical History, Past Surgical History, Family History and Social History were reviewed in [employer] record.  Physical Exam: General: Well developed, well nourished, elderly, no acute distress Head: Normocephalic and atraumatic Eyes:  sclerae anicteric, EOMI Ears: Normal auditory acuity Mouth: No deformity or lesions Lungs: Clear throughout to auscultation Heart: Regular rate and rhythm; no murmurs, rubs or bruits Abdomen: Soft, non tender and non distended. No masses, hepatosplenomegaly or hernias noted. Normal Bowel  sounds Musculoskeletal: Symmetrical with no gross deformities  Pulses:  Normal pulses noted Extremities: No clubbing, cyanosis, edema or deformities noted Neurological: Alert oriented x 4, grossly nonfocal Psychological:  Alert and cooperative. Normal mood and affect  Assessment and Recommendations:  1. Diarrhea, abdominal pain, nausea, vomiting have resolved and were likely due to a gastroenteritis.  Persistent diarrhea was likely due to Mg which he has stopped or was postinfectious. Change Imodium as needed for recurrent diarrhea.  GI follow-up PRN.  2.  Abnormal CT scan of the pelvis with prostate gland enlargement and osseous pelvic findings.  Patient has been to his urologist and PCP for further evaluation and follow up of these findings.  Patient states his PSA was recently checked and it was normal.  Continue follow-up with PCP and urologist as recommended.  3.  CRC screening, average risk.  He is up-to-date on colonoscopy.  No plans for future screening colonoscopies due to age.

## 2018-02-20 NOTE — Patient Instructions (Addendum)
Follow up with Primary Care Physician as directed  Follow up with Gastroenterology  As needed   If you are age 81 or older, your body mass index should be between 23-30. Your Body mass index is 26.44 kg/m. If this is out of the aforementioned range listed, please consider follow up with your Primary Care Provider.  If you are age 47 or younger, your body mass index should be between 19-25. Your Body mass index is 26.44 kg/m. If this is out of the aformentioned range listed, please consider follow up with your Primary Care Provider.    Thank you for choosing me and [facility].  Pricilla Riffle. Dagoberto Ligas., MD., [facility] Regal

## 2018-02-26 DIAGNOSIS — J301 Allergic rhinitis due to pollen: Secondary | ICD-10-CM | POA: Diagnosis not present

## 2018-02-26 DIAGNOSIS — J3089 Other allergic rhinitis: Secondary | ICD-10-CM | POA: Diagnosis not present

## 2018-03-04 DIAGNOSIS — J301 Allergic rhinitis due to pollen: Secondary | ICD-10-CM | POA: Diagnosis not present

## 2018-03-04 DIAGNOSIS — J3089 Other allergic rhinitis: Secondary | ICD-10-CM | POA: Diagnosis not present

## 2018-03-11 DIAGNOSIS — J3089 Other allergic rhinitis: Secondary | ICD-10-CM | POA: Diagnosis not present

## 2018-03-11 DIAGNOSIS — J301 Allergic rhinitis due to pollen: Secondary | ICD-10-CM | POA: Diagnosis not present

## 2018-03-13 ENCOUNTER — Other Ambulatory Visit: Payer: Self-pay | Admitting: Endocrinology

## 2018-03-14 LAB — CUP PACEART REMOTE DEVICE CHECK
Date Time Interrogation Session: 20190408221036
MDC IDC PG IMPLANT DT: 20170512

## 2018-03-14 NOTE — Telephone Encounter (Signed)
Please refill prn 

## 2018-03-14 NOTE — Telephone Encounter (Signed)
Ok to refill this? 

## 2018-03-17 ENCOUNTER — Ambulatory Visit (INDEPENDENT_AMBULATORY_CARE_PROVIDER_SITE_OTHER): Payer: Medicare Other | Admitting: *Deleted

## 2018-03-17 DIAGNOSIS — R55 Syncope and collapse: Secondary | ICD-10-CM | POA: Diagnosis not present

## 2018-03-17 NOTE — Progress Notes (Signed)
[facility] Summary Report / Loop Recorder 

## 2018-03-26 DIAGNOSIS — J3089 Other allergic rhinitis: Secondary | ICD-10-CM | POA: Diagnosis not present

## 2018-03-26 DIAGNOSIS — J301 Allergic rhinitis due to pollen: Secondary | ICD-10-CM | POA: Diagnosis not present

## 2018-03-29 ENCOUNTER — Other Ambulatory Visit: Payer: Self-pay | Admitting: Endocrinology

## 2018-04-02 DIAGNOSIS — J301 Allergic rhinitis due to pollen: Secondary | ICD-10-CM | POA: Diagnosis not present

## 2018-04-02 DIAGNOSIS — J3089 Other allergic rhinitis: Secondary | ICD-10-CM | POA: Diagnosis not present

## 2018-04-09 LAB — CUP PACEART REMOTE DEVICE CHECK
Date Time Interrogation Session: 20190511223818
MDC IDC PG IMPLANT DT: 20170512

## 2018-04-10 DIAGNOSIS — J301 Allergic rhinitis due to pollen: Secondary | ICD-10-CM | POA: Diagnosis not present

## 2018-04-16 DIAGNOSIS — J301 Allergic rhinitis due to pollen: Secondary | ICD-10-CM | POA: Diagnosis not present

## 2018-04-16 DIAGNOSIS — J3089 Other allergic rhinitis: Secondary | ICD-10-CM | POA: Diagnosis not present

## 2018-04-17 ENCOUNTER — Other Ambulatory Visit: Payer: Self-pay | Admitting: Endocrinology

## 2018-04-17 ENCOUNTER — Ambulatory Visit (INDEPENDENT_AMBULATORY_CARE_PROVIDER_SITE_OTHER): Payer: Medicare Other | Admitting: *Deleted

## 2018-04-17 DIAGNOSIS — R55 Syncope and collapse: Secondary | ICD-10-CM | POA: Diagnosis not present

## 2018-04-18 NOTE — Progress Notes (Signed)
[facility] Summary Report / Loop Recorder 

## 2018-05-07 ENCOUNTER — Ambulatory Visit (INDEPENDENT_AMBULATORY_CARE_PROVIDER_SITE_OTHER): Payer: Medicare Other | Admitting: Endocrinology

## 2018-05-07 ENCOUNTER — Encounter: Payer: Self-pay | Admitting: Endocrinology

## 2018-05-07 VITALS — BP 132/84 | HR 67 | Wt 216.0 lb

## 2018-05-07 DIAGNOSIS — E119 Type 2 diabetes mellitus without complications: Secondary | ICD-10-CM

## 2018-05-07 DIAGNOSIS — J3089 Other allergic rhinitis: Secondary | ICD-10-CM | POA: Diagnosis not present

## 2018-05-07 DIAGNOSIS — J301 Allergic rhinitis due to pollen: Secondary | ICD-10-CM | POA: Diagnosis not present

## 2018-05-07 LAB — POCT GLYCOSYLATED HEMOGLOBIN (HGB A1C): Hemoglobin A1C: 6.8 % — AB (ref 4.0–5.6)

## 2018-05-07 MED ORDER — TRAMADOL HCL 50 MG PO TABS: 50.0000 mg | ORAL_TABLET | Freq: Four times a day (QID) | ORAL | 0 refills | Status: DC | PRN

## 2018-05-07 NOTE — Patient Instructions (Addendum)
Here is a prescription, for tramadol, for the pain. It is best to avoid the melioxicam, as it is not good for your kidneys. Please come back for a follow-up appointment in 3-4 months (after 08/16/18).

## 2018-05-07 NOTE — Progress Notes (Signed)
Subjective:    Patient ID: Joseph Rocha, male    DOB: April 30, 1935, 83 y.o.   MRN: [id number]  HPI 3 days of moderate pain at the right shoulder.  This started with a fall in the bathroom.  No assoc numbness Past Medical History:  Diagnosis Date  . Allergy   . Anemia   . Diabetes mellitus   . Elevated PSA   . Hyperlipidemia   . Hypertension   . Hypogonadism male   . OA (osteoarthritis)   . 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 DOPPLER ECHOCARDIOGRAPHY  12/04/2001  . ELECTROCARDIOGRAM  03/25/2006  . EP IMPLANTABLE DEVICE N/A 03/16/2016   Procedure: Loop Recorder Insertion;  Surgeon: Deboraha Sprang, MD;  Location: [facility];  Service: Cardiovascular;  Laterality: N/A;  . KNEE ARTHROSCOPY Left   . LUMBAR DISC SURGERY    . MENISCUS REPAIR Right may 2015  . TONSILLECTOMY    . TRANSPHENOIDAL / TRANSNASAL HYPOPHYSECTOMY / RESECTION PITUITARY TUMOR  2005    Social History   Socioeconomic History  . Marital status: Married    Spouse name: Not on file  . Number of children: 2  . Years of education: Not on file  . Highest education level: Not on file  Occupational History  . Occupation: retired    Fish farm manager: RETIRED  Social Needs  . Financial resource strain: Not on file  . Food insecurity:    Worry: Not on file    Inability: Not on file  . Transportation needs:    Medical: Not on file    Non-medical: Not on file  Tobacco Use  . Smoking status: Former Smoker    Last attempt to quit: 02/22/1957    Years since quitting: 61.2  . Smokeless tobacco: Never Used  Substance and Sexual Activity  . Alcohol use: No  . Drug use: No  . Sexual activity: Not Currently  Lifestyle  . Physical activity:    Days per week: Not on file    Minutes per session: Not on file  . Stress: Not on file  Relationships  . Social connections:    Talks on phone: Not on file    Gets together: Not on file    Attends religious service:  Not on file    Active member of club or organization: Not on file    Attends meetings of clubs or organizations: Not on file    Relationship status: Not on file  . Intimate partner violence:    Fear of current or ex partner: Not on file    Emotionally abused: Not on file    Physically abused: Not on file    Forced sexual activity: Not on file  Other Topics Concern  . Not on file  Social History Narrative   Lives with wife.  They have two grown children.   He is retired from the [employer].    Current Outpatient Medications on File Prior to Visit  Medication Sig Dispense Refill  . calcium carbonate (OS-CAL) 600 MG TABS Take 600 mg by mouth daily.    . cetirizine-pseudoephedrine (ZYRTEC-D) 5-120 MG tablet Take 1 tablet by mouth daily. 90 tablet 2  . EPINEPHrine 0.3 mg/0.3 mL IJ SOAJ injection Inject 0.3 mg into the muscle once as needed (allergic reaction).   0  . finasteride (PROSCAR) 5 MG tablet Take 1 tablet (5 mg total) by mouth daily. 90 tablet prn  . fish  oil-omega-3 fatty acids 1000 MG capsule Take 1 g by mouth daily.    . fludrocortisone (FLORINEF) 0.1 MG tablet Take 1 tablet (0.1 mg total) by mouth daily. 90 tablet 2  . folic acid (FOLVITE) 1 MG tablet TAKE 1 TABLET DAILY 90 tablet 2  . IRON PO Take by mouth.    Marland Kitchen KLOR-CON M20 20 MEQ tablet Take 20 mEq by mouth daily.     Marland Kitchen levothyroxine (SYNTHROID, LEVOTHROID) 50 MCG tablet Take 1 tablet (50 mcg total) by mouth daily. 90 tablet 2  . loperamide (IMODIUM A-D) 2 MG tablet Take 1 tablet (2 mg total) by mouth 2 (two) times daily as needed for diarrhea or loose stools. 30 tablet 1  . methocarbamol (ROBAXIN) 500 MG tablet TAKE 1 TABLET AT BEDTIME ASNEEDED FOR MUSCLE SPASM 90 tablet 2  . Multiple Vitamins-Minerals (CENTRUM SILVER PO) Take 1 tablet by mouth daily.      Marland Kitchen omeprazole (PRILOSEC) 40 MG capsule TAKE 1 CAPSULE BY MOUTH ONCE DAILY 90 capsule 0  . rosuvastatin (CRESTOR) 5 MG tablet TAKE 1 TABLET DAILY 90 tablet 1  .  Tamsulosin HCl (FLOMAX) 0.4 MG CAPS Take 0.4 mg by mouth daily.      . Testosterone (AXIRON) 30 MG/ACT SOLN Place 1 each onto the skin daily.      No current facility-administered medications on file prior to visit.     Allergies  Allergen Reactions  . Lipitor [Atorvastatin] Other (See Comments)    Myalgias, cramps in hand    Family History  Problem Relation Age of Onset  . Cancer Brother        uncertain type  . Diabetes Mellitus II Mother        Deceased, 41s  . Heart attack Father   . Healthy Daughter     BP 132/84 (BP Location: Left Arm, Patient Position: Sitting, Cuff Size: Normal)   Pulse 67   Wt 216 lb (98 kg)   SpO2 97%   BMI 27.00 kg/m   Review of Systems Denies muscle cramps.  No weight change.     Objective:   Physical Exam VITAL SIGNS:  See vs page GENERAL: no distress Right shoulder: full ROM, but ROM is painful.  No swelling of the RUE.   Lab Results  Component Value Date   HGBA1C 6.8 (A) 05/07/2018   Lab Results  Component Value Date   CREATININE 1.16 02/18/2018   BUN 19 02/18/2018   NA 131 (L) 02/18/2018   K 4.1 02/18/2018   CL 103 02/18/2018   CO2 24 02/18/2018      Assessment & Plan:  Type 2 DM: well-controlled off medication.  We'll follow. Renal insuff: this limits rx options for the pain Shoulder pain, new, due to a fall.  He declines x-ray  Patient Instructions  Here is a prescription, for tramadol, for the pain. It is best to avoid the melioxicam, as it is not good for your kidneys. Please come back for a follow-up appointment in 3-4 months (after 08/16/18).

## 2018-05-19 ENCOUNTER — Ambulatory Visit (INDEPENDENT_AMBULATORY_CARE_PROVIDER_SITE_OTHER): Payer: Medicare Other | Admitting: *Deleted

## 2018-05-19 DIAGNOSIS — R55 Syncope and collapse: Secondary | ICD-10-CM

## 2018-05-21 NOTE — Progress Notes (Signed)
[facility] Summary Report / Loop Recorder 

## 2018-05-23 LAB — CUP PACEART REMOTE DEVICE CHECK
Date Time Interrogation Session: 20190613233909
Implantable Pulse Generator Implant Date: 20170512

## 2018-05-26 ENCOUNTER — Encounter: Payer: Self-pay | Admitting: Internal Medicine

## 2018-05-27 ENCOUNTER — Ambulatory Visit (INDEPENDENT_AMBULATORY_CARE_PROVIDER_SITE_OTHER): Payer: Medicare Other | Admitting: Endocrinology

## 2018-05-27 ENCOUNTER — Encounter: Payer: Self-pay | Admitting: Endocrinology

## 2018-05-27 DIAGNOSIS — R21 Rash and other nonspecific skin eruption: Secondary | ICD-10-CM | POA: Diagnosis not present

## 2018-05-27 DIAGNOSIS — E871 Hypo-osmolality and hyponatremia: Secondary | ICD-10-CM

## 2018-05-27 LAB — CBC WITH DIFFERENTIAL/PLATELET
BASOS ABS: 0 10*3/uL (ref 0.0–0.1)
Basophils Relative: 0.9 % (ref 0.0–3.0)
Eosinophils Absolute: 0.2 10*3/uL (ref 0.0–0.7)
Eosinophils Relative: 3.9 % (ref 0.0–5.0)
HCT: 38.1 % — ABNORMAL LOW (ref 39.0–52.0)
Hemoglobin: 12.8 g/dL — ABNORMAL LOW (ref 13.0–17.0)
Lymphocytes Relative: 33.7 % (ref 12.0–46.0)
Lymphs Abs: 1.7 10*3/uL (ref 0.7–4.0)
MCHC: 33.7 g/dL (ref 30.0–36.0)
MCV: 91.3 fl (ref 78.0–100.0)
MONO ABS: 0.7 10*3/uL (ref 0.1–1.0)
Monocytes Relative: 14.5 % — ABNORMAL HIGH (ref 3.0–12.0)
Neutro Abs: 2.4 10*3/uL (ref 1.4–7.7)
Neutrophils Relative %: 47 % (ref 43.0–77.0)
Platelets: 192 10*3/uL (ref 150.0–400.0)
RBC: 4.17 Mil/uL — AB (ref 4.22–5.81)
RDW: 13.7 % (ref 11.5–15.5)
WBC: 5 10*3/uL (ref 4.0–10.5)

## 2018-05-27 LAB — BASIC METABOLIC PANEL
BUN: 19 mg/dL (ref 6–23)
CO2: 28 mEq/L (ref 19–32)
CREATININE: 1.12 mg/dL (ref 0.40–1.50)
Calcium: 9.5 mg/dL (ref 8.4–10.5)
Chloride: 103 mEq/L (ref 96–112)
GFR: 80.86 mL/min (ref >60.00)
Glucose, Bld: 158 mg/dL — ABNORMAL HIGH (ref 70–99)
POTASSIUM: 3.8 meq/L (ref 3.5–5.1)
Sodium: 137 mEq/L (ref 135–145)

## 2018-05-27 LAB — SEDIMENTATION RATE: SED RATE: 8 mm/h (ref 0–20)

## 2018-05-27 NOTE — Patient Instructions (Signed)
blood tests are requested for you today.  We'll let you know about the results. I hope you feel better soon.  If you don't feel better by next week, please call back.  Please call sooner if you get worse.

## 2018-05-27 NOTE — Progress Notes (Signed)
Subjective:    Patient ID: Joseph Rocha, male    DOB: Mar 09, 1935, 83 y.o.   MRN: [id number]  HPI 1 day of slight swelling of both feet, and assoc redness.  sxs started when he was at the beach, but he is unable to cite precip factor.  Past Medical History:  Diagnosis Date  . Allergy   . Anemia   . Diabetes mellitus   . Elevated PSA   . Hyperlipidemia   . Hypertension   . Hypogonadism male   . OA (osteoarthritis)   . 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 DOPPLER ECHOCARDIOGRAPHY  12/04/2001  . ELECTROCARDIOGRAM  03/25/2006  . EP IMPLANTABLE DEVICE N/A 03/16/2016   Procedure: Loop Recorder Insertion;  Surgeon: Deboraha Sprang, MD;  Location: [facility];  Service: Cardiovascular;  Laterality: N/A;  . KNEE ARTHROSCOPY Left   . LUMBAR DISC SURGERY    . MENISCUS REPAIR Right may 2015  . TONSILLECTOMY    . TRANSPHENOIDAL / TRANSNASAL HYPOPHYSECTOMY / RESECTION PITUITARY TUMOR  2005    Social History   Socioeconomic History  . Marital status: Married    Spouse name: Not on file  . Number of children: 2  . Years of education: Not on file  . Highest education level: Not on file  Occupational History  . Occupation: retired    Fish farm manager: RETIRED  Social Needs  . Financial resource strain: Not on file  . Food insecurity:    Worry: Not on file    Inability: Not on file  . Transportation needs:    Medical: Not on file    Non-medical: Not on file  Tobacco Use  . Smoking status: Former Smoker    Last attempt to quit: 02/22/1957    Years since quitting: 61.2  . Smokeless tobacco: Never Used  Substance and Sexual Activity  . Alcohol use: No  . Drug use: No  . Sexual activity: Not Currently  Lifestyle  . Physical activity:    Days per week: Not on file    Minutes per session: Not on file  . Stress: Not on file  Relationships  . Social connections:    Talks on phone: Not on file    Gets together: Not on file   Attends religious service: Not on file    Active member of club or organization: Not on file    Attends meetings of clubs or organizations: Not on file    Relationship status: Not on file  . Intimate partner violence:    Fear of current or ex partner: Not on file    Emotionally abused: Not on file    Physically abused: Not on file    Forced sexual activity: Not on file  Other Topics Concern  . Not on file  Social History Narrative   Lives with wife.  They have two grown children.   He is retired from the [employer].    Current Outpatient Medications on File Prior to Visit  Medication Sig Dispense Refill  . calcium carbonate (OS-CAL) 600 MG TABS Take 600 mg by mouth daily.    . cetirizine-pseudoephedrine (ZYRTEC-D) 5-120 MG tablet Take 1 tablet by mouth daily. 90 tablet 2  . EPINEPHrine 0.3 mg/0.3 mL IJ SOAJ injection Inject 0.3 mg into the muscle once as needed (allergic reaction).   0  . finasteride (PROSCAR) 5 MG tablet Take 1 tablet (5 mg total) by mouth daily. [place]  tablet prn  . fish oil-omega-3 fatty acids 1000 MG capsule Take 1 g by mouth daily.    . fludrocortisone (FLORINEF) 0.1 MG tablet Take 1 tablet (0.1 mg total) by mouth daily. 90 tablet 2  . folic acid (FOLVITE) 1 MG tablet TAKE 1 TABLET DAILY 90 tablet 2  . IRON PO Take by mouth.    Marland Kitchen KLOR-CON M20 20 MEQ tablet Take 20 mEq by mouth daily.     Marland Kitchen levothyroxine (SYNTHROID, LEVOTHROID) 50 MCG tablet Take 1 tablet (50 mcg total) by mouth daily. 90 tablet 2  . loperamide (IMODIUM A-D) 2 MG tablet Take 1 tablet (2 mg total) by mouth 2 (two) times daily as needed for diarrhea or loose stools. 30 tablet 1  . methocarbamol (ROBAXIN) 500 MG tablet TAKE 1 TABLET AT BEDTIME ASNEEDED FOR MUSCLE SPASM 90 tablet 2  . Multiple Vitamins-Minerals (CENTRUM SILVER PO) Take 1 tablet by mouth daily.      Marland Kitchen omeprazole (PRILOSEC) 40 MG capsule TAKE 1 CAPSULE BY MOUTH ONCE DAILY 90 capsule 0  . rosuvastatin (CRESTOR) 5 MG tablet TAKE 1 TABLET  DAILY 90 tablet 1  . Tamsulosin HCl (FLOMAX) 0.4 MG CAPS Take 0.4 mg by mouth daily.      . Testosterone (AXIRON) 30 MG/ACT SOLN Place 1 each onto the skin daily.     . traMADol (ULTRAM) 50 MG tablet Take 1 tablet (50 mg total) by mouth every 6 (six) hours as needed. 15 tablet 0   No current facility-administered medications on file prior to visit.     Allergies  Allergen Reactions  . Lipitor [Atorvastatin] Other (See Comments)    Myalgias, cramps in hand    Family History  Problem Relation Age of Onset  . Cancer Brother        uncertain type  . Diabetes Mellitus II Mother        Deceased, 65s  . Heart attack Father   . Healthy Daughter     BP 140/72 (BP Location: Left Arm, Patient Position: Sitting, Cuff Size: Normal)   Pulse 67   Wt 212 lb (96.2 kg)   SpO2 95%   BMI 26.50 kg/m    Review of Systems Denies foot pain or itching.  Denies sob.      Objective:   Physical Exam VITAL SIGNS:  See vs page.   GENERAL: no distress.   Feet: slight erythema and swelling.  No tenderness.        Assessment & Plan:  Foot symptoms, new, uncertain etiology Hyponatremia: recheck today   Patient Instructions  blood tests are requested for you today.  We'll let you know about the results. I hope you feel better soon.  If you don't feel better by next week, please call back.  Please call sooner if you get worse.

## 2018-05-29 DIAGNOSIS — J301 Allergic rhinitis due to pollen: Secondary | ICD-10-CM | POA: Diagnosis not present

## 2018-05-29 DIAGNOSIS — J3089 Other allergic rhinitis: Secondary | ICD-10-CM | POA: Diagnosis not present

## 2018-06-04 DIAGNOSIS — J301 Allergic rhinitis due to pollen: Secondary | ICD-10-CM | POA: Diagnosis not present

## 2018-06-04 DIAGNOSIS — J3089 Other allergic rhinitis: Secondary | ICD-10-CM | POA: Diagnosis not present

## 2018-06-11 DIAGNOSIS — J301 Allergic rhinitis due to pollen: Secondary | ICD-10-CM | POA: Diagnosis not present

## 2018-06-11 DIAGNOSIS — J3089 Other allergic rhinitis: Secondary | ICD-10-CM | POA: Diagnosis not present

## 2018-06-23 ENCOUNTER — Ambulatory Visit (INDEPENDENT_AMBULATORY_CARE_PROVIDER_SITE_OTHER): Payer: Medicare Other | Admitting: *Deleted

## 2018-06-23 DIAGNOSIS — R55 Syncope and collapse: Secondary | ICD-10-CM

## 2018-06-23 NOTE — Progress Notes (Signed)
[facility] Summary Report / Loop Recorder 

## 2018-06-24 ENCOUNTER — Encounter: Payer: Self-pay | Admitting: Internal Medicine

## 2018-06-25 DIAGNOSIS — J3089 Other allergic rhinitis: Secondary | ICD-10-CM | POA: Diagnosis not present

## 2018-06-25 DIAGNOSIS — J301 Allergic rhinitis due to pollen: Secondary | ICD-10-CM | POA: Diagnosis not present

## 2018-07-02 ENCOUNTER — Other Ambulatory Visit: Payer: Self-pay | Admitting: Emergency Medicine

## 2018-07-02 MED ORDER — METHOCARBAMOL 500 MG PO TABS: ORAL_TABLET | ORAL | 0 refills | Status: DC

## 2018-07-05 ENCOUNTER — Other Ambulatory Visit: Payer: Self-pay | Admitting: Endocrinology

## 2018-07-06 DIAGNOSIS — R197 Diarrhea, unspecified: Secondary | ICD-10-CM | POA: Diagnosis not present

## 2018-07-07 LAB — CUP PACEART REMOTE DEVICE CHECK
Implantable Pulse Generator Implant Date: 20170512
MDC IDC SESS DTM: 20190717000557

## 2018-07-16 DIAGNOSIS — J301 Allergic rhinitis due to pollen: Secondary | ICD-10-CM | POA: Diagnosis not present

## 2018-07-16 DIAGNOSIS — J3089 Other allergic rhinitis: Secondary | ICD-10-CM | POA: Diagnosis not present

## 2018-07-17 DIAGNOSIS — N401 Enlarged prostate with lower urinary tract symptoms: Secondary | ICD-10-CM | POA: Diagnosis not present

## 2018-07-17 DIAGNOSIS — R351 Nocturia: Secondary | ICD-10-CM | POA: Diagnosis not present

## 2018-07-17 DIAGNOSIS — E291 Testicular hypofunction: Secondary | ICD-10-CM | POA: Diagnosis not present

## 2018-07-25 ENCOUNTER — Ambulatory Visit (INDEPENDENT_AMBULATORY_CARE_PROVIDER_SITE_OTHER): Payer: Medicare Other | Admitting: *Deleted

## 2018-07-25 ENCOUNTER — Other Ambulatory Visit: Payer: Self-pay | Admitting: Endocrinology

## 2018-07-25 DIAGNOSIS — R55 Syncope and collapse: Secondary | ICD-10-CM

## 2018-07-26 NOTE — Progress Notes (Signed)
[facility] Summary Report / Loop Recorder 

## 2018-07-27 LAB — CUP PACEART REMOTE DEVICE CHECK
Date Time Interrogation Session: 20190921014003
MDC IDC PG IMPLANT DT: 20170512

## 2018-07-28 DIAGNOSIS — E291 Testicular hypofunction: Secondary | ICD-10-CM | POA: Diagnosis not present

## 2018-07-28 LAB — CUP PACEART REMOTE DEVICE CHECK
Date Time Interrogation Session: 20190819003536
Implantable Pulse Generator Implant Date: 20170512

## 2018-07-29 ENCOUNTER — Telehealth: Payer: Self-pay | Admitting: Endocrinology

## 2018-07-29 NOTE — Telephone Encounter (Signed)
Would you like for pt to make appt?Please advise

## 2018-07-29 NOTE — Telephone Encounter (Signed)
Patients needs Dr Loanne Drilling to sign his Handicap placard form.   He also needs a referral to a kidney dr.  Please Advise

## 2018-07-29 NOTE — Telephone Encounter (Signed)
Ok, forms are in the drawer.  I'll sign

## 2018-07-30 ENCOUNTER — Telehealth: Payer: Self-pay | Admitting: Endocrinology

## 2018-07-30 NOTE — Telephone Encounter (Signed)
Pt is calling for a referral for Kidney doctor. Pt wants to see Dr Erling Cruz or Dr Joelyn Oms pt which to have call back to discuss. Pt is going out of town this weekend and want to speak, to someone ASAP

## 2018-07-30 NOTE — Telephone Encounter (Signed)
Form placed on desk to sign

## 2018-07-30 NOTE — Telephone Encounter (Signed)
done

## 2018-07-30 NOTE — Telephone Encounter (Signed)
Patient has been waiting for a call from our office since yesterday. Please call patient at ph# [phone]. Very important.

## 2018-07-30 NOTE — Telephone Encounter (Signed)
DMV form placed in nurses box at front  Please call pt when completed asap # [phone]

## 2018-07-31 DIAGNOSIS — J3089 Other allergic rhinitis: Secondary | ICD-10-CM | POA: Diagnosis not present

## 2018-07-31 DIAGNOSIS — J301 Allergic rhinitis due to pollen: Secondary | ICD-10-CM | POA: Diagnosis not present

## 2018-07-31 NOTE — Telephone Encounter (Signed)
Request placed on [facility] along with [facility] paperwork and will also sent this message to him

## 2018-07-31 NOTE — Telephone Encounter (Signed)
Pt aware paperwork ready for pick up

## 2018-07-31 NOTE — Telephone Encounter (Signed)
Paperwork placed on physician desk 

## 2018-07-31 NOTE — Telephone Encounter (Signed)
done

## 2018-07-31 NOTE — Telephone Encounter (Signed)
Pt stated that he received a letter from insurance about a visit from March that stated that he needed to see a specialist I asked pt to bring in that paper when he comes to pick up [facility] paperwork

## 2018-07-31 NOTE — Telephone Encounter (Signed)
I need to know the reason 

## 2018-07-31 NOTE — Telephone Encounter (Signed)
Patient picked up handicap placard today

## 2018-08-01 ENCOUNTER — Telehealth: Payer: Self-pay | Admitting: Endocrinology

## 2018-08-01 DIAGNOSIS — N183 Chronic kidney disease, stage 3 unspecified: Secondary | ICD-10-CM | POA: Insufficient documentation

## 2018-08-01 DIAGNOSIS — N289 Disorder of kidney and ureter, unspecified: Secondary | ICD-10-CM

## 2018-08-01 NOTE — Telephone Encounter (Signed)
[place], I did referral.  Your recent kidney blood tests have been normal, so they may decline

## 2018-08-02 LAB — GLUCOSE, POCT (MANUAL RESULT ENTRY): POC Glucose: 165 mg/dl — AB (ref 70–99)

## 2018-08-02 LAB — POCT GLYCOSYLATED HEMOGLOBIN (HGB A1C): Hemoglobin A1C: 8 % — AB (ref 4.0–5.6)

## 2018-08-13 ENCOUNTER — Other Ambulatory Visit: Payer: Self-pay | Admitting: Endocrinology

## 2018-08-20 ENCOUNTER — Telehealth: Payer: Self-pay | Admitting: Cardiology

## 2018-08-20 NOTE — Telephone Encounter (Signed)
LMOVM requesting that pt send manual transmission b/c home monitor has not updated in at least 14 days.    

## 2018-08-27 ENCOUNTER — Ambulatory Visit (INDEPENDENT_AMBULATORY_CARE_PROVIDER_SITE_OTHER): Payer: Medicare Other | Admitting: *Deleted

## 2018-08-27 DIAGNOSIS — J3089 Other allergic rhinitis: Secondary | ICD-10-CM | POA: Diagnosis not present

## 2018-08-27 DIAGNOSIS — R55 Syncope and collapse: Secondary | ICD-10-CM | POA: Diagnosis not present

## 2018-08-27 DIAGNOSIS — J301 Allergic rhinitis due to pollen: Secondary | ICD-10-CM | POA: Diagnosis not present

## 2018-08-28 NOTE — Progress Notes (Signed)
[facility] Summary Report / Loop Recorder 

## 2018-09-05 ENCOUNTER — Other Ambulatory Visit: Payer: Self-pay | Admitting: Endocrinology

## 2018-09-05 NOTE — Telephone Encounter (Signed)
Please refill x 1 Further refills from new PCP 

## 2018-09-09 ENCOUNTER — Other Ambulatory Visit: Payer: Self-pay | Admitting: Endocrinology

## 2018-09-10 DIAGNOSIS — J3089 Other allergic rhinitis: Secondary | ICD-10-CM | POA: Diagnosis not present

## 2018-09-10 DIAGNOSIS — J301 Allergic rhinitis due to pollen: Secondary | ICD-10-CM | POA: Diagnosis not present

## 2018-09-17 ENCOUNTER — Other Ambulatory Visit: Payer: Self-pay | Admitting: Endocrinology

## 2018-09-17 NOTE — Telephone Encounter (Signed)
Please refill x 3 mos Further refills from new PCP

## 2018-09-18 LAB — CUP PACEART REMOTE DEVICE CHECK
Implantable Pulse Generator Implant Date: 20170512
MDC IDC SESS DTM: 20191024013758

## 2018-09-22 ENCOUNTER — Ambulatory Visit (INDEPENDENT_AMBULATORY_CARE_PROVIDER_SITE_OTHER): Payer: Medicare Other | Admitting: Internal Medicine

## 2018-09-22 ENCOUNTER — Encounter: Payer: Self-pay | Admitting: Internal Medicine

## 2018-09-22 VITALS — BP 128/84 | HR 65 | Ht 75.0 in | Wt 216.8 lb

## 2018-09-22 DIAGNOSIS — R55 Syncope and collapse: Secondary | ICD-10-CM | POA: Diagnosis not present

## 2018-09-22 NOTE — Progress Notes (Signed)
Patient Care Team: Renato Shin, MD as PCP - General (Endocrinology) Carolan Clines, MD as Attending Physician (Urology) Belva Crome, MD as Attending Physician (Cardiology) Erline Levine, MD as Attending Physician (Neurosurgery) Harold Hedge, Darrick Grinder, MD (Allergy and Immunology)   HPI  Joseph Rocha is a 83 y.o. male Seen in follow-up for recurrent syncope.  His episodes are stereotypical. Cardiac evaluation demonstrated a normal Myoview normal echo and normal ECG.  No interval syncope; nocturnal pauses noted 3/3 and pt was called--had been vomiting that night  The patient denies chest pain, shortness of breath, nocturnal dyspnea, orthopnea or peripheral edema.  There have been no palpitations, lightheadedness or syncope.          Past Medical History:  Diagnosis Date  . Allergy   . Anemia   . Diabetes mellitus   . Elevated PSA   . Hyperlipidemia   . Hypertension   . Hypogonadism male   . OA (osteoarthritis)   . 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 DOPPLER ECHOCARDIOGRAPHY  12/04/2001  . ELECTROCARDIOGRAM  03/25/2006  . EP IMPLANTABLE DEVICE N/A 03/16/2016   Procedure: Loop Recorder Insertion;  Surgeon: Deboraha Sprang, MD;  Location: [facility];  Service: Cardiovascular;  Laterality: N/A;  . KNEE ARTHROSCOPY Left   . LUMBAR DISC SURGERY    . MENISCUS REPAIR Right may 2015  . TONSILLECTOMY    . TRANSPHENOIDAL / TRANSNASAL HYPOPHYSECTOMY / RESECTION PITUITARY TUMOR  2005    Current Outpatient Medications  Medication Sig Dispense Refill  . calcium carbonate (OS-CAL) 600 MG TABS Take 600 mg by mouth daily.    . cetirizine-pseudoephedrine (ZYRTEC-D) 5-120 MG tablet Take 1 tablet by mouth daily. 90 tablet 2  . EPINEPHrine 0.3 mg/0.3 mL IJ SOAJ injection Inject 0.3 mg into the muscle once as needed (allergic reaction).   0  . finasteride (PROSCAR) 5 MG tablet TAKE 1 TABLET DAILY 90 tablet 0    . fludrocortisone (FLORINEF) 0.1 MG tablet Take 1 tablet (0.1 mg total) by mouth daily. 90 tablet 2  . folic acid (FOLVITE) 1 MG tablet TAKE 1 TABLET DAILY 90 tablet 2  . IRON PO Take by mouth.    Marland Kitchen KLOR-CON M20 20 MEQ tablet Take 20 mEq by mouth daily.     Marland Kitchen levothyroxine (SYNTHROID, LEVOTHROID) 50 MCG tablet Take 1 tablet by mouth daily.    Marland Kitchen loperamide (IMODIUM A-D) 2 MG tablet Take 1 tablet (2 mg total) by mouth 2 (two) times daily as needed for diarrhea or loose stools. 30 tablet 1  . methocarbamol (ROBAXIN) 500 MG tablet TAKE 1 TABLET AT BEDTIME ASNEEDED FOR MUSCLE SPASM 90 tablet 0  . Multiple Vitamins-Minerals (CENTRUM SILVER PO) Take 1 tablet by mouth daily.      . Omega-3 Fatty Acids (FISH OIL) 1000 MG CAPS Take 1 capsule by mouth daily.    Marland Kitchen omeprazole (PRILOSEC) 40 MG capsule TAKE 1 CAPSULE BY MOUTH EVERY DAY 30 capsule 1  . rosuvastatin (CRESTOR) 5 MG tablet TAKE 1 TABLET DAILY 90 tablet 1  . Tamsulosin HCl (FLOMAX) 0.4 MG CAPS Take 0.4 mg by mouth daily.      . Testosterone 30 MG/ACT SOLN Place 2 Pump onto the skin daily.    . traMADol (ULTRAM) 50 MG tablet Take 1 tablet (50 mg total) by mouth every 6 (six) hours as needed. 15 tablet 0   No current facility-administered medications  for this visit.     Allergies  Allergen Reactions  . Lipitor [Atorvastatin] Other (See Comments)    Myalgias, cramps in hand      Review of Systems negative except from HPI and PMH  Physical Exam BP 128/84   Pulse 65   Ht 6\' 3"  (1.905 m)   Wt 216 lb 12.8 oz (98.3 kg)   SpO2 97%   BMI 27.10 kg/m  Well developed and nourished in no acute distress HENT normal Neck supple with JVP-flat Clear Regular rate and rhythm, no murmurs or gallops Abd-soft with active BS No Clubbing cyanosis edema Skin-warm and dry A & Oriented  Grossly normal sensory and motor function   ECG sinus @ 65 15/08/43  Assessment and  Plan Syncope  Implantable loop recorder  Sinus bradycardia  No  interval syncope  F/u 1 yr     Current medicines are reviewed at length with the patient today .  The patient does not  have concerns regarding medicines.

## 2018-09-22 NOTE — Patient Instructions (Addendum)
Your physician recommends that you continue on your current medications as directed. Please refer to the Current Medication list given to you today.   Your physician recommends that you schedule a follow-up appointment in Your physician wants you to follow-up in: 12 months with Dr. Caryl Comes.  Please call our office to schedule the follow-up appointment.

## 2018-09-23 LAB — CUP PACEART INCLINIC DEVICE CHECK
Date Time Interrogation Session: 20191118190439
MDC IDC PG IMPLANT DT: 20170512

## 2018-09-24 DIAGNOSIS — J301 Allergic rhinitis due to pollen: Secondary | ICD-10-CM | POA: Diagnosis not present

## 2018-09-24 DIAGNOSIS — J3089 Other allergic rhinitis: Secondary | ICD-10-CM | POA: Diagnosis not present

## 2018-09-29 ENCOUNTER — Ambulatory Visit (INDEPENDENT_AMBULATORY_CARE_PROVIDER_SITE_OTHER): Payer: Medicare Other

## 2018-09-29 DIAGNOSIS — R55 Syncope and collapse: Secondary | ICD-10-CM

## 2018-09-30 NOTE — Progress Notes (Signed)
[facility] Summary Report / Loop Recorder 

## 2018-10-07 ENCOUNTER — Ambulatory Visit (INDEPENDENT_AMBULATORY_CARE_PROVIDER_SITE_OTHER): Payer: Medicare Other | Admitting: Endocrinology

## 2018-10-07 ENCOUNTER — Encounter: Payer: Self-pay | Admitting: Endocrinology

## 2018-10-07 VITALS — BP 152/88 | HR 80 | Ht 75.0 in | Wt 212.6 lb

## 2018-10-07 DIAGNOSIS — E119 Type 2 diabetes mellitus without complications: Secondary | ICD-10-CM

## 2018-10-07 DIAGNOSIS — J209 Acute bronchitis, unspecified: Secondary | ICD-10-CM

## 2018-10-07 LAB — POCT GLYCOSYLATED HEMOGLOBIN (HGB A1C): Hemoglobin A1C: 8.2 % — AB (ref 4.0–5.6)

## 2018-10-07 MED ORDER — CEFUROXIME AXETIL 250 MG PO TABS: 250.0000 mg | ORAL_TABLET | Freq: Two times a day (BID) | ORAL | 0 refills | Status: DC

## 2018-10-07 MED ORDER — FLUTICASONE-SALMETEROL 100-50 MCG/DOSE IN AEPB: 1.0000 | INHALATION_SPRAY | Freq: Two times a day (BID) | RESPIRATORY_TRACT | 0 refills | Status: DC

## 2018-10-07 MED ORDER — METFORMIN HCL ER 500 MG PO TB24: 500.0000 mg | ORAL_TABLET | Freq: Every day | ORAL | 3 refills | Status: DC

## 2018-10-07 NOTE — Progress Notes (Signed)
Subjective:    Patient ID: Joseph Rocha, male    DOB: 1935-10-06, 83 y.o.   MRN: [id number]  HPI  Pt returns for f/u of diabetes mellitus: DM type: 2 Dx'ed: 7425 Complications: polyneuropathy and renal insuff Therapy: no medication now DKA: never Severe hypoglycemia: never Pancreatitis: never Pancreatic imaging: normal on 2019 CT Other: he has never taken insulin Interval history: 1 week of slight prod-quality cough in the chest, and assoc nasal congestion.   Past Medical History:  Diagnosis Date  . Allergy   . Anemia   . Diabetes mellitus   . Elevated PSA   . Hyperlipidemia   . Hypertension   . Hypogonadism male   . OA (osteoarthritis)   . 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 DOPPLER ECHOCARDIOGRAPHY  12/04/2001  . ELECTROCARDIOGRAM  03/25/2006  . EP IMPLANTABLE DEVICE N/A 03/16/2016   Procedure: Loop Recorder Insertion;  Surgeon: Deboraha Sprang, MD;  Location: [facility];  Service: Cardiovascular;  Laterality: N/A;  . KNEE ARTHROSCOPY Left   . LUMBAR DISC SURGERY    . MENISCUS REPAIR Right may 2015  . TONSILLECTOMY    . TRANSPHENOIDAL / TRANSNASAL HYPOPHYSECTOMY / RESECTION PITUITARY TUMOR  2005    Social History   Socioeconomic History  . Marital status: Married    Spouse name: Not on file  . Number of children: 2  . Years of education: Not on file  . Highest education level: Not on file  Occupational History  . Occupation: retired    Fish farm manager: RETIRED  Social Needs  . Financial resource strain: Not on file  . Food insecurity:    Worry: Not on file    Inability: Not on file  . Transportation needs:    Medical: Not on file    Non-medical: Not on file  Tobacco Use  . Smoking status: Former Smoker    Last attempt to quit: 02/22/1957    Years since quitting: 61.6  . Smokeless tobacco: Never Used  Substance and Sexual Activity  . Alcohol use: No  . Drug use: No  . Sexual activity: Not  Currently  Lifestyle  . Physical activity:    Days per week: Not on file    Minutes per session: Not on file  . Stress: Not on file  Relationships  . Social connections:    Talks on phone: Not on file    Gets together: Not on file    Attends religious service: Not on file    Active member of club or organization: Not on file    Attends meetings of clubs or organizations: Not on file    Relationship status: Not on file  . Intimate partner violence:    Fear of current or ex partner: Not on file    Emotionally abused: Not on file    Physically abused: Not on file    Forced sexual activity: Not on file  Other Topics Concern  . Not on file  Social History Narrative   Lives with wife.  They have two grown children.   He is retired from the [employer].    Current Outpatient Medications on File Prior to Visit  Medication Sig Dispense Refill  . calcium carbonate (OS-CAL) 600 MG TABS Take 600 mg by mouth daily.    . cetirizine-pseudoephedrine (ZYRTEC-D) 5-120 MG tablet Take 1 tablet by mouth daily. 90 tablet 2  . EPINEPHrine 0.3 mg/0.3 mL IJ SOAJ injection  Inject 0.3 mg into the muscle once as needed (allergic reaction).   0  . finasteride (PROSCAR) 5 MG tablet TAKE 1 TABLET DAILY 90 tablet 0  . fludrocortisone (FLORINEF) 0.1 MG tablet Take 1 tablet (0.1 mg total) by mouth daily. 90 tablet 2  . folic acid (FOLVITE) 1 MG tablet TAKE 1 TABLET DAILY 90 tablet 2  . IRON PO Take by mouth.    Marland Kitchen KLOR-CON M20 20 MEQ tablet Take 20 mEq by mouth daily.     Marland Kitchen levothyroxine (SYNTHROID, LEVOTHROID) 50 MCG tablet Take 1 tablet by mouth daily.    Marland Kitchen loperamide (IMODIUM A-D) 2 MG tablet Take 1 tablet (2 mg total) by mouth 2 (two) times daily as needed for diarrhea or loose stools. 30 tablet 1  . methocarbamol (ROBAXIN) 500 MG tablet TAKE 1 TABLET AT BEDTIME ASNEEDED FOR MUSCLE SPASM 90 tablet 0  . Multiple Vitamins-Minerals (CENTRUM SILVER PO) Take 1 tablet by mouth daily.      . Omega-3 Fatty Acids  (FISH OIL) 1000 MG CAPS Take 1 capsule by mouth daily.    Marland Kitchen omeprazole (PRILOSEC) 40 MG capsule TAKE 1 CAPSULE BY MOUTH EVERY DAY 30 capsule 1  . rosuvastatin (CRESTOR) 5 MG tablet TAKE 1 TABLET DAILY 90 tablet 1  . Tamsulosin HCl (FLOMAX) 0.4 MG CAPS Take 0.4 mg by mouth daily.      . Testosterone 30 MG/ACT SOLN Place 2 Pump onto the skin daily.    . traMADol (ULTRAM) 50 MG tablet Take 1 tablet (50 mg total) by mouth every 6 (six) hours as needed. 15 tablet 0   No current facility-administered medications on file prior to visit.     Allergies  Allergen Reactions  . Lipitor [Atorvastatin] Other (See Comments)    Myalgias, cramps in hand    Family History  Problem Relation Age of Onset  . Cancer Brother        uncertain type  . Diabetes Mellitus II Mother        Deceased, 67s  . Heart attack Father   . Healthy Daughter     BP (!) 152/88 (BP Location: Right Arm, Patient Position: Sitting, Cuff Size: Normal)   Pulse 80   Ht 6\' 3"  (1.905 m)   Wt 212 lb 9.6 oz (96.4 kg)   SpO2 94%   BMI 26.57 kg/m    Review of Systems Denies fever and sob.     Objective:   Physical Exam VITAL SIGNS:  See vs page.  GENERAL: no distress.   head: no deformity.   eyes: no periorbital swelling, no proptosis.   external nose and ears are normal  mouth: no lesion seen Both eac's and tm's are normal Pulses: dorsalis pedis intact bilat.   MSK: no deformity of the feet.   CV: trace bilat leg edema.   Skin:  no ulcer on the feet.  normal color and temp on the feet.   Neuro: sensation is intact to touch on the feet Ext: There is bilateral onychomycosis of the toenails.    Lab Results  Component Value Date   HGBA1C 8.2 (A) 10/07/2018   Lab Results  Component Value Date   CREATININE 1.12 05/27/2018   BUN 19 05/27/2018   NA 137 05/27/2018   K 3.8 05/27/2018   CL 103 05/27/2018   CO2 28 05/27/2018       Assessment & Plan:  AB, new Type 2 DM: worse  Patient Instructions  I have  sent 3 prescriptions  to your pharmacy: antibiotic, inhaler, and to resume the metformin. Loratadine-d (non-prescription) will help your congestion.  Please come back for a follow-up appointment in 3 months.

## 2018-10-07 NOTE — Patient Instructions (Addendum)
I have sent 3 prescriptions to your pharmacy: antibiotic, inhaler, and to resume the metformin. Loratadine-d (non-prescription) will help your congestion.  Please come back for a follow-up appointment in 3 months.

## 2018-10-13 ENCOUNTER — Telehealth: Payer: Self-pay | Admitting: Endocrinology

## 2018-10-13 DIAGNOSIS — R05 Cough: Secondary | ICD-10-CM | POA: Insufficient documentation

## 2018-10-13 DIAGNOSIS — R059 Cough, unspecified: Secondary | ICD-10-CM | POA: Insufficient documentation

## 2018-10-13 NOTE — Telephone Encounter (Signed)
Patient has called in regards Cefueoxime, states it is not working. Please Advise, thanks

## 2018-10-13 NOTE — Telephone Encounter (Signed)
Please verify no fever or sob. I have requested CXR.  Please do at the [place] office.

## 2018-10-13 NOTE — Addendum Note (Signed)
Addended by: Renato Shin on: 10/13/2018 04:13 PM   Modules accepted: Orders

## 2018-10-13 NOTE — Telephone Encounter (Signed)
Called pt to inquire further and to inform of CXR order. LVM requesting returned call.

## 2018-10-13 NOTE — Telephone Encounter (Signed)
Please advise 

## 2018-10-14 ENCOUNTER — Telehealth: Payer: Self-pay | Admitting: Endocrinology

## 2018-10-14 ENCOUNTER — Ambulatory Visit (INDEPENDENT_AMBULATORY_CARE_PROVIDER_SITE_OTHER)
Admission: RE | Admit: 2018-10-14 | Discharge: 2018-10-14 | Disposition: A | Discharge status: Home / Self Care | Payer: Medicare Other | Source: Ambulatory Visit | Attending: Endocrinology | Admitting: Endocrinology

## 2018-10-14 DIAGNOSIS — R05 Cough: Secondary | ICD-10-CM

## 2018-10-14 DIAGNOSIS — R059 Cough, unspecified: Secondary | ICD-10-CM

## 2018-10-14 NOTE — Telephone Encounter (Signed)
Per [facility] "Caller says that he just missed a call from Dr. Cordelia Pen nurse. He had called about his refills earlier."

## 2018-10-14 NOTE — Telephone Encounter (Signed)
Pt returned call. Denies fever and shortness of breath. Informed of recommendations and orders. Advised we will call with CXR results. Verbalized acceptance and understanding.

## 2018-10-14 NOTE — Telephone Encounter (Signed)
This encounter closed as duplicate

## 2018-10-15 ENCOUNTER — Telehealth: Payer: Self-pay | Admitting: Internal Medicine

## 2018-10-15 ENCOUNTER — Ambulatory Visit (INDEPENDENT_AMBULATORY_CARE_PROVIDER_SITE_OTHER): Payer: Medicare Other | Admitting: Internal Medicine

## 2018-10-15 ENCOUNTER — Encounter: Payer: Self-pay | Admitting: Internal Medicine

## 2018-10-15 DIAGNOSIS — J209 Acute bronchitis, unspecified: Secondary | ICD-10-CM

## 2018-10-15 MED ORDER — FLUTICASONE PROPIONATE 50 MCG/ACT NA SUSP: 2.0000 | Freq: Every day | NASAL | 6 refills | Status: AC

## 2018-10-15 NOTE — Assessment & Plan Note (Signed)
Appears to be resolving at this time. Lungs are clear. Nose with crusting and drainage. Rx for flonase and encouraged to take zyrtec. Likely does not need advair although he can continue if he wants. Advised to push fluids as he is not drinking or eating as much recently. No clinical signs of dehydration on exam.

## 2018-10-15 NOTE — Telephone Encounter (Signed)
Ok with me 

## 2018-10-15 NOTE — Patient Instructions (Signed)
We have sent in a nose spray to use 2 sprays in each nostril once a day for the next 1-2 weeks.   The cough may linger another couple of weeks before it is gone.

## 2018-10-15 NOTE — Telephone Encounter (Signed)
Copied from [place] [id number]. Topic: Appointment Scheduling - Transfer of Care >> Oct 15, 2018  9:10 AM Oneta Rack wrote: Patient of Dr. Renato Shin, MD would like to re establish with Dr. Cathlean Cower, please advise

## 2018-10-15 NOTE — Telephone Encounter (Signed)
Patient requesting to establish care with Dr Jenny Reichmann as a transfer from Dr Loanne Drilling.  Dr Jenny Reichmann is this okay with you?

## 2018-10-15 NOTE — Progress Notes (Signed)
   Subjective:    Patient ID: Joseph Rocha, male    DOB: [birth year], 83 y.o.   MRN: [id number]  HPI The patient is an 83 YO man coming in for ongoing cough and sinus symptoms. Saw his PCP about 1-2 weeks ago and given 1 week course of ceftin. He did finish this and felt it was helping with symptoms but now is having worsening symptoms again. He is not sure if he is taking zyrtec currently. He is taking an inhaler advair temporarily which was also prescribed by his doctor. He is not sure if this is helping. He denies SOB with activity. Some lightheadedness from pressure in his head. Denies falls or LOC. He denies fevers or chills. Overall is coughing still with some yellow or green sputum. Called his doctor yesterday to get more antibiotics and got CXR which did not show pneumonia or bronchitis. He is using sinus rinses which help temporarily with pressure and drainage. Not taking any flonase or similar. Overall is better than 1-2 weeks ago. Overall symptoms started not long before the first visit.   Review of Systems  Constitutional: Positive for appetite change. Negative for activity change, chills, fatigue, fever and unexpected weight change.  HENT: Positive for congestion, postnasal drip, rhinorrhea and sinus pressure. Negative for ear discharge, ear pain, sinus pain, sneezing, sore throat, tinnitus, trouble swallowing and voice change.   Eyes: Negative.   Respiratory: Positive for cough. Negative for chest tightness, shortness of breath and wheezing.   Cardiovascular: Negative.   Gastrointestinal: Negative.   Musculoskeletal: Positive for myalgias.  Neurological: Positive for light-headedness. Negative for dizziness, seizures, syncope, facial asymmetry and weakness.      Objective:   Physical Exam  Constitutional: He is oriented to person, place, and time. He appears well-developed and well-nourished. No distress.  HENT:  Head: Normocephalic and atraumatic.  Oropharynx with redness and  clear drainage, nose with swollen turbinates, TMs normal bilaterally  Eyes: EOM are normal.  Neck: Normal range of motion. No thyromegaly present.  Cardiovascular: Normal rate and regular rhythm.  Pulmonary/Chest: Effort normal and breath sounds normal. No respiratory distress. He has no wheezes. He has no rales.  Abdominal: Soft.  Lymphadenopathy:    He has no cervical adenopathy.  Neurological: He is alert and oriented to person, place, and time.  Skin: Skin is warm and dry.   Vitals:   10/15/18 1037  BP: 110/66  Pulse: 71  Temp: 98 F (36.7 C)  TempSrc: Oral  SpO2: 98%  Weight: 209 lb (94.8 kg)  Height: 6\' 3"  (1.905 m)      Assessment & Plan:  Visit time 25 minutes: greater than 50% of that time was spent in face to face counseling and coordination of care with the patient: counseled about the lack of indication for antibiotics at this time, the etiology of his symptoms, the best method to help his symptoms, also the lack of indication for antibiotics just on color of nose drainage.

## 2018-10-15 NOTE — Telephone Encounter (Signed)
Informed patient, will call back to schedule.

## 2018-10-19 ENCOUNTER — Encounter (HOSPITAL_COMMUNITY): Payer: Self-pay

## 2018-10-19 ENCOUNTER — Emergency Department (HOSPITAL_COMMUNITY): Payer: Medicare Other

## 2018-10-19 ENCOUNTER — Other Ambulatory Visit: Payer: Self-pay

## 2018-10-19 ENCOUNTER — Emergency Department (HOSPITAL_COMMUNITY)
Admission: EM | Admit: 2018-10-19 | Discharge: 2018-10-19 | Disposition: A | Discharge status: Home / Self Care | Payer: Medicare Other | Attending: Emergency Medicine | Admitting: Emergency Medicine

## 2018-10-19 DIAGNOSIS — Z87891 Personal history of nicotine dependence: Secondary | ICD-10-CM | POA: Diagnosis not present

## 2018-10-19 DIAGNOSIS — B9789 Other viral agents as the cause of diseases classified elsewhere: Secondary | ICD-10-CM | POA: Insufficient documentation

## 2018-10-19 DIAGNOSIS — I10 Essential (primary) hypertension: Secondary | ICD-10-CM | POA: Diagnosis not present

## 2018-10-19 DIAGNOSIS — J069 Acute upper respiratory infection, unspecified: Secondary | ICD-10-CM | POA: Diagnosis not present

## 2018-10-19 DIAGNOSIS — E119 Type 2 diabetes mellitus without complications: Secondary | ICD-10-CM | POA: Insufficient documentation

## 2018-10-19 DIAGNOSIS — E039 Hypothyroidism, unspecified: Secondary | ICD-10-CM | POA: Diagnosis not present

## 2018-10-19 DIAGNOSIS — Z7984 Long term (current) use of oral hypoglycemic drugs: Secondary | ICD-10-CM | POA: Diagnosis not present

## 2018-10-19 DIAGNOSIS — Z79899 Other long term (current) drug therapy: Secondary | ICD-10-CM | POA: Insufficient documentation

## 2018-10-19 DIAGNOSIS — R05 Cough: Secondary | ICD-10-CM | POA: Diagnosis not present

## 2018-10-19 LAB — COMPREHENSIVE METABOLIC PANEL
ALK PHOS: 61 U/L (ref 38–126)
ALT: 35 U/L (ref 0–44)
ANION GAP: 10 (ref 5–15)
AST: 40 U/L (ref 15–41)
Albumin: 4.1 g/dL (ref 3.5–5.0)
BUN: 14 mg/dL (ref 8–23)
CO2: 26 mmol/L (ref 22–32)
Calcium: 9.1 mg/dL (ref 8.9–10.3)
Chloride: 99 mmol/L (ref 98–111)
Creatinine, Ser: 1.09 mg/dL (ref 0.61–1.24)
GFR calc Af Amer: >60 mL/min (ref >60)
GFR calc non Af Amer: >60 mL/min (ref >60)
Glucose, Bld: 158 mg/dL — ABNORMAL HIGH (ref 70–99)
Potassium: 3.6 mmol/L (ref 3.5–5.1)
Sodium: 135 mmol/L (ref 135–145)
Total Bilirubin: 0.9 mg/dL (ref 0.3–1.2)
Total Protein: 7 g/dL (ref 6.5–8.1)

## 2018-10-19 LAB — URINALYSIS, ROUTINE W REFLEX MICROSCOPIC
Bilirubin Urine: NEGATIVE
Glucose, UA: NEGATIVE mg/dL
Hgb urine dipstick: NEGATIVE
Ketones, ur: NEGATIVE mg/dL
LEUKOCYTES UA: NEGATIVE
Nitrite: NEGATIVE
Protein, ur: NEGATIVE mg/dL
Specific Gravity, Urine: 1.012 (ref 1.005–1.030)
pH: 8 (ref 5.0–8.0)

## 2018-10-19 LAB — CBC
HCT: 38 % — ABNORMAL LOW (ref 39.0–52.0)
Hemoglobin: 12.7 g/dL — ABNORMAL LOW (ref 13.0–17.0)
MCH: 30.5 pg (ref 26.0–34.0)
MCHC: 33.4 g/dL (ref 30.0–36.0)
MCV: 91.1 fL (ref 80.0–100.0)
Platelets: 224 10*3/uL (ref 150–400)
RBC: 4.17 MIL/uL — ABNORMAL LOW (ref 4.22–5.81)
RDW: 13.4 % (ref 11.5–15.5)
WBC: 6.2 10*3/uL (ref 4.0–10.5)
nRBC: 0 % (ref 0.0–0.2)

## 2018-10-19 MED ORDER — SODIUM CHLORIDE 0.9 % IV BOLUS
1000.0000 mL | Freq: Once | INTRAVENOUS | Status: AC
  Administered 2018-10-19: 1000 mL via INTRAVENOUS

## 2018-10-19 NOTE — ED Triage Notes (Signed)
Pt has not felt well for past month.  Has sinus infection which has been treated by primary MD.  Cough, nasal congestion.  No fever.

## 2018-10-19 NOTE — ED Provider Notes (Signed)
[facility] DEPT Provider Note   CSN: [id number] Arrival date & time: 10/19/18  0906     History   Chief Complaint Chief Complaint  Patient presents with  . Cough  . Nasal Congestion    HPI Joseph Rocha is a 83 y.o. male.  HPI Patient is an 83 year old male presents the emergency department with nasal congestion and generalized malaise over the past month.  He was treated with antibiotics for possible sinus infection by his primary care physician and felt better for several days but now is back to feeling worse.  He reports postnasal drip and nasal congestion and frontal sinus tenderness.  Denies nausea vomiting or diarrhea.  No fevers or chills.  No chest pain or shortness of breath.  Denies abdominal pain.  Symptoms are mild in severity.   Past Medical History:  Diagnosis Date  . Allergy   . Anemia   . Diabetes mellitus   . Elevated PSA   . Hyperlipidemia   . Hypertension   . Hypogonadism male   . OA (osteoarthritis)   . Pituitary abnormality [facility]) 2004    Patient Active Problem List   Diagnosis Date Noted  . Cough 10/13/2018  . Renal insufficiency 08/01/2018  . Rash 05/27/2018  . Hyponatremia 05/27/2018  . Diarrhea 02/18/2018  . Constipation 12/05/2017  . Acute bronchitis 10/02/2017  . Hypocalcemia 04/29/2017  . Vitamin D intoxication 04/29/2017  . Low back pain 11/16/2016  . Edema 10/03/2016  . Dyspnea 10/03/2016  . Hypothyroidism 07/26/2016  . Chest wall pain 04/06/2016  . Numbness 04/06/2016  . Contusion of right foot 12/12/2015  . Hypotension 11/10/2014  . Weight loss 07/07/2014  . Sick sinus syndrome ([place]) 03/02/2014  . Nausea with vomiting 03/25/2013  . Bilateral leg weakness 07/21/2012  . Encounter for long-term (current) use of other medications 02/13/2012  . Pituitary abnormality ([place]) 02/23/2011  . Lymphadenitis, acute 02/23/2011  . BRADYCARDIA 10/17/2009  . DIZZINESS 10/17/2009  . [place] DISEASE, CERVICAL  12/28/2008  . SINUSITIS- ACUTE-NOS 11/22/2008  . Hypogonadism male 09/01/2008  . 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 FOOT DROP, RIGHT 09/01/2008  . PSA, INCREASED 09/01/2008  . PITUITARY MACROADENOMA 05/06/2008  . HYPERCHOLESTEROLEMIA 05/06/2008  . SYNCOPE 05/06/2008  . Diabetes ([place]) 05/14/2007  . Anemia 05/14/2007  . ALLERGIC RHINITIS 05/14/2007  . DIVERTICULOSIS, COLON 05/14/2007  . OSTEOARTHRITIS 05/14/2007    Past Surgical History:  Procedure Laterality Date  . DOPPLER ECHOCARDIOGRAPHY  12/04/2001  . ELECTROCARDIOGRAM  03/25/2006  . EP IMPLANTABLE DEVICE N/A 03/16/2016   Procedure: Loop Recorder Insertion;  Surgeon: Deboraha Sprang, MD;  Location: [facility];  Service: Cardiovascular;  Laterality: N/A;  . KNEE ARTHROSCOPY Left   . LUMBAR DISC SURGERY    . MENISCUS REPAIR Right may 2015  . TONSILLECTOMY    . TRANSPHENOIDAL / TRANSNASAL HYPOPHYSECTOMY / RESECTION PITUITARY TUMOR  2005        Home Medications    Prior to Admission medications   Medication Sig Start Date End Date Taking? Authorizing Provider  calcium carbonate (OS-CAL) 600 MG TABS Take 600 mg by mouth daily.    [provider]  cetirizine-pseudoephedrine (ZYRTEC-D) 5-120 MG tablet Take 1 tablet by mouth daily. 05/09/17   Renato Shin, MD  EPINEPHrine 0.3 mg/0.3 mL IJ SOAJ injection Inject 0.3 mg into the muscle once as needed (allergic reaction).  11/08/16   [provider]  finasteride (PROSCAR) 5 MG tablet TAKE 1 TABLET DAILY 09/17/18  Renato Shin, MD  fludrocortisone (FLORINEF) 0.1 MG tablet Take 1 tablet (0.1 mg total) by mouth daily. 02/18/18   Renato Shin, MD  fluticasone (FLONASE) 50 MCG/ACT nasal spray Place 2 sprays into both nostrils daily. 10/15/18   Hoyt Koch, MD  Fluticasone-Salmeterol (ADVAIR DISKUS) 100-50 MCG/DOSE AEPB Inhale 1 puff into the lungs 2 (two) times daily. 10/07/18   Renato Shin, MD  folic acid (FOLVITE) 1 MG tablet TAKE 1  TABLET DAILY 06/24/17   Renato Shin, MD  IRON PO Take by mouth.    [provider]  KLOR-CON M20 20 MEQ tablet Take 20 mEq by mouth daily.  03/15/13   [provider]  levothyroxine (SYNTHROID, LEVOTHROID) 50 MCG tablet Take 1 tablet by mouth daily.    [provider]  loperamide (IMODIUM A-D) 2 MG tablet Take 1 tablet (2 mg total) by mouth 2 (two) times daily as needed for diarrhea or loose stools. 01/10/18   Levin Erp, PA  metFORMIN (GLUCOPHAGE-XR) 500 MG 24 hr tablet Take 1 tablet (500 mg total) by mouth daily with breakfast. 10/07/18   Renato Shin, MD  methocarbamol (ROBAXIN) 500 MG tablet TAKE 1 TABLET AT BEDTIME ASNEEDED FOR MUSCLE SPASM 09/17/18   Renato Shin, MD  Multiple Vitamins-Minerals (CENTRUM SILVER PO) Take 1 tablet by mouth daily.      [provider]  Omega-3 Fatty Acids (FISH OIL) 1000 MG CAPS Take 1 capsule by mouth daily.    [provider]  omeprazole (PRILOSEC) 40 MG capsule TAKE 1 CAPSULE BY MOUTH EVERY DAY 09/08/18   Renato Shin, MD  rosuvastatin (CRESTOR) 5 MG tablet TAKE 1 TABLET DAILY 07/25/18   Renato Shin, MD  Tamsulosin HCl (FLOMAX) 0.4 MG CAPS Take 0.4 mg by mouth daily.      [provider]  Testosterone 30 MG/ACT SOLN Place 2 Pump onto the skin daily.    [provider]  traMADol (ULTRAM) 50 MG tablet Take 1 tablet (50 mg total) by mouth every 6 (six) hours as needed. 05/07/18   Renato Shin, MD    Family History Family History  Problem Relation Age of Onset  . Cancer Brother        uncertain type  . Diabetes Mellitus II Mother        Deceased, 37s  . Heart attack Father   . Healthy Daughter     Social History Social History   Tobacco Use  . Smoking status: Former Smoker    Last attempt to quit: 02/22/1957    Years since quitting: 61.6  . Smokeless tobacco: Never Used  Substance Use Topics  . Alcohol use: No  . Drug use: No     Allergies   Lipitor  [atorvastatin]   Review of Systems Review of Systems  All other systems reviewed and are negative.    Physical Exam Updated Vital Signs BP 126/85 (BP Location: Left Arm)   Pulse 66   Temp 97.7 F (36.5 C) (Oral)   Resp 15   Ht 6\' 3"  (1.905 m)   SpO2 100%   BMI 26.12 kg/m   Physical Exam Vitals signs and nursing note reviewed.  Constitutional:      Appearance: He is well-developed.  HENT:     Head: Normocephalic and atraumatic.     Right Ear: Tympanic membrane normal.     Left Ear: Tympanic membrane normal.     Mouth/Throat:     Mouth: Mucous membranes are moist.     Pharynx:  Oropharynx is clear. No oropharyngeal exudate or posterior oropharyngeal erythema.  Eyes:     Pupils: Pupils are equal, round, and reactive to light.  Neck:     Musculoskeletal: Normal range of motion.  Cardiovascular:     Rate and Rhythm: Normal rate and regular rhythm.     Heart sounds: Normal heart sounds.  Pulmonary:     Effort: Pulmonary effort is normal. No respiratory distress.     Breath sounds: Normal breath sounds.  Abdominal:     General: There is no distension.     Palpations: Abdomen is soft.     Tenderness: There is no abdominal tenderness.  Musculoskeletal: Normal range of motion.  Skin:    General: Skin is warm and dry.  Neurological:     Mental Status: He is alert and oriented to person, place, and time.  Psychiatric:        Judgment: Judgment normal.      ED Treatments / Results  Labs (all labs ordered are listed, but only abnormal results are displayed) Labs Reviewed  CBC - Abnormal; Notable for the following components:      Result Value   RBC 4.17 (*)    Hemoglobin 12.7 (*)    HCT 38.0 (*)    All other components within normal limits  COMPREHENSIVE METABOLIC PANEL - Abnormal; Notable for the following components:   Glucose, Bld 158 (*)    All other components within normal limits  URINALYSIS, ROUTINE W REFLEX MICROSCOPIC    EKG None  Radiology Dg  Chest 2 View  Result Date: 10/19/2018 CLINICAL DATA:  Cough, weakness, has not felt well for the past month, nasal congestion EXAM: CHEST - 2 VIEW COMPARISON:  10/14/2018 Correlation: CT chest 04/16/2016 FINDINGS: Loop recorder projects over LEFT chest. Upper normal heart size. Tortuous aorta. Prominent RIGHT paratracheal soft tissues unchanged, corresponds to vascular structures on a prior CT exam Pulmonary vascularity normal. Lungs clear. No infiltrate, pleural effusion, pneumothorax or acute osseous findings. Osseous demineralization with degenerative disc disease changes of the thoracic spine and. RIGHT AC joint degenerative changes with inferior spur formation. IMPRESSION: No acute abnormalities. Electronically Signed   By: Lavonia Dana M.D.   On: 10/19/2018 10:29    Procedures Procedures (including critical care time)  Medications Ordered in ED Medications  sodium chloride 0.9 % bolus 1,000 mL (1,000 mLs Intravenous New Bag/Given 10/19/18 1002)     Initial Impression / Assessment and Plan / ED Course  I have reviewed the triage vital signs and the nursing notes.  Pertinent labs & imaging results that were available during my care of the patient were reviewed by me and considered in my medical decision making (see chart for details).     Overall well-appearing.  Fluids given.  Labs reassuring.  Chest x-ray without infiltrate.  Close primary care follow-up.  No indication for additional testing or inpatient work-up at this time.  Patient encouraged to return to the emergency department for new or worsening symptoms.  No clear etiology to his symptoms found today.  Stable vital signs.  Final Clinical Impressions(s) / ED Diagnoses   Final diagnoses:  None    ED Discharge Orders    None       Jola Schmidt, MD 10/19/18 [phone]

## 2018-10-22 ENCOUNTER — Encounter: Payer: Self-pay | Admitting: Internal Medicine

## 2018-10-22 ENCOUNTER — Ambulatory Visit (INDEPENDENT_AMBULATORY_CARE_PROVIDER_SITE_OTHER): Payer: Medicare Other | Admitting: Internal Medicine

## 2018-10-22 VITALS — BP 114/76 | HR 68 | Temp 98.0°F | Ht 75.0 in | Wt 205.0 lb

## 2018-10-22 DIAGNOSIS — R059 Cough, unspecified: Secondary | ICD-10-CM

## 2018-10-22 DIAGNOSIS — E119 Type 2 diabetes mellitus without complications: Secondary | ICD-10-CM

## 2018-10-22 DIAGNOSIS — R05 Cough: Secondary | ICD-10-CM

## 2018-10-22 DIAGNOSIS — I950 Idiopathic hypotension: Secondary | ICD-10-CM | POA: Diagnosis not present

## 2018-10-22 MED ORDER — HYDROCODONE-HOMATROPINE 5-1.5 MG/5ML PO SYRP: 5.0000 mL | ORAL_SOLUTION | Freq: Four times a day (QID) | ORAL | 0 refills | Status: AC | PRN

## 2018-10-22 MED ORDER — DOXYCYCLINE HYCLATE 100 MG PO TABS: 100.0000 mg | ORAL_TABLET | Freq: Two times a day (BID) | ORAL | 0 refills | Status: DC

## 2018-10-22 NOTE — Assessment & Plan Note (Signed)
Low normal, to cont same tx,  to f/u any worsening symptoms or concerns

## 2018-10-22 NOTE — Progress Notes (Signed)
Subjective:    Patient ID: Joseph Rocha, male    DOB: August 28, 1935, 83 y.o.   MRN: [id number]  HPI  Here with acute onset mild to mod 2-3 days ST, HA, general weakness and malaise, with prod cough greenish sputum, but Pt denies chest pain, increased sob or doe, wheezing, orthopnea, PND, increased LE swelling, palpitations, dizziness or syncope.  Pt denies new neurological symptoms such as new headache, or facial or extremity weakness or numbness   Pt denies polydipsia, polyuria.   Past Medical History:  Diagnosis Date  . Allergy   . Anemia   . Diabetes mellitus   . Elevated PSA   . Hyperlipidemia   . Hypertension   . Hypogonadism male   . OA (osteoarthritis)   . Pituitary abnormality ([place]) 2004   Past Surgical History:  Procedure Laterality Date  . DOPPLER ECHOCARDIOGRAPHY  12/04/2001  . ELECTROCARDIOGRAM  03/25/2006  . EP IMPLANTABLE DEVICE N/A 03/16/2016   Procedure: Loop Recorder Insertion;  Surgeon: Deboraha Sprang, MD;  Location: [facility];  Service: Cardiovascular;  Laterality: N/A;  . KNEE ARTHROSCOPY Left   . LUMBAR DISC SURGERY    . MENISCUS REPAIR Right may 2015  . TONSILLECTOMY    . TRANSPHENOIDAL / TRANSNASAL HYPOPHYSECTOMY / RESECTION PITUITARY TUMOR  2005    reports that he quit smoking about 61 years ago. He has never used smokeless tobacco. He reports that he does not drink alcohol or use drugs. family history includes Cancer in his brother; Diabetes Mellitus II in his mother; Healthy in his daughter; Heart attack in his father. Allergies  Allergen Reactions  . Lipitor [Atorvastatin] Other (See Comments)    Myalgias, cramps in hand   Current Outpatient Medications on File Prior to Visit  Medication Sig Dispense Refill  . calcium carbonate (OS-CAL) 600 MG TABS Take 600 mg by mouth daily.    . cetirizine-pseudoephedrine (ZYRTEC-D) 5-120 MG tablet Take 1 tablet by mouth daily. 90 tablet 2  . EPINEPHrine 0.3 mg/0.3 mL IJ SOAJ injection Inject 0.3 mg into  the muscle once as needed (allergic reaction).   0  . finasteride (PROSCAR) 5 MG tablet TAKE 1 TABLET DAILY 90 tablet 0  . fludrocortisone (FLORINEF) 0.1 MG tablet Take 1 tablet (0.1 mg total) by mouth daily. 90 tablet 2  . fluticasone (FLONASE) 50 MCG/ACT nasal spray Place 2 sprays into both nostrils daily. 16 g 6  . Fluticasone-Salmeterol (ADVAIR DISKUS) 100-50 MCG/DOSE AEPB Inhale 1 puff into the lungs 2 (two) times daily. 1 each 0  . folic acid (FOLVITE) 1 MG tablet TAKE 1 TABLET DAILY 90 tablet 2  . IRON PO Take by mouth.    Marland Kitchen KLOR-CON M20 20 MEQ tablet Take 20 mEq by mouth daily.     Marland Kitchen levothyroxine (SYNTHROID, LEVOTHROID) 50 MCG tablet Take 1 tablet by mouth daily.    Marland Kitchen loperamide (IMODIUM A-D) 2 MG tablet Take 1 tablet (2 mg total) by mouth 2 (two) times daily as needed for diarrhea or loose stools. 30 tablet 1  . metFORMIN (GLUCOPHAGE-XR) 500 MG 24 hr tablet Take 1 tablet (500 mg total) by mouth daily with breakfast. 90 tablet 3  . methocarbamol (ROBAXIN) 500 MG tablet TAKE 1 TABLET AT BEDTIME ASNEEDED FOR MUSCLE SPASM 90 tablet 0  . Multiple Vitamins-Minerals (CENTRUM SILVER PO) Take 1 tablet by mouth daily.      . Omega-3 Fatty Acids (FISH OIL) 1000 MG CAPS Take 1 capsule by mouth daily.    Marland Kitchen  omeprazole (PRILOSEC) 40 MG capsule TAKE 1 CAPSULE BY MOUTH EVERY DAY 30 capsule 1  . rosuvastatin (CRESTOR) 5 MG tablet TAKE 1 TABLET DAILY 90 tablet 1  . Tamsulosin HCl (FLOMAX) 0.4 MG CAPS Take 0.4 mg by mouth daily.      . Testosterone 30 MG/ACT SOLN Place 2 Pump onto the skin daily.    . traMADol (ULTRAM) 50 MG tablet Take 1 tablet (50 mg total) by mouth every 6 (six) hours as needed. 15 tablet 0   No current facility-administered medications on file prior to visit.    Review of Systems  Constitutional: Negative for other unusual diaphoresis or sweats HENT: Negative for ear discharge or swelling Eyes: Negative for other worsening visual disturbances Respiratory: Negative for stridor or  other swelling  Gastrointestinal: Negative for worsening distension or other blood Genitourinary: Negative for retention or other urinary change Musculoskeletal: Negative for other MSK pain or swelling Skin: Negative for color change or other new lesions Neurological: Negative for worsening tremors and other numbness  Psychiatric/Behavioral: Negative for worsening agitation or other fatigue All other system neg per pt    Objective:   Physical Exam BP 114/76   Pulse 68   Temp 98 F (36.7 C) (Oral)   Ht 6\' 3"  (1.905 m)   Wt 205 lb (93 kg)   SpO2 97%   BMI 25.62 kg/m  VS noted, mild ill Constitutional: Pt appears in NAD HENT: Head: NCAT.  Right Ear: External ear normal.  Left Ear: External ear normal.  Eyes: . Pupils are equal, round, and reactive to light. Conjunctivae and EOM are normal Bilat tm's with mild erythema.  Max sinus areas non tender.  Pharynx with mild erythema, no exudate Nose: without d/c or deformity Neck: Neck supple. Gross normal ROM Cardiovascular: Normal rate and regular rhythm.   Pulmonary/Chest: Effort normal and breath sounds decreased without rales or wheezing.  Neurological: Pt is alert. At baseline orientation, motor grossly intact Skin: Skin is warm. No rashes, other new lesions, no LE edema Psychiatric: Pt behavior is normal without agitation  No other exam findings Lab Results  Component Value Date   WBC 6.2 10/19/2018   HGB 12.7 (L) 10/19/2018   HCT 38.0 (L) 10/19/2018   PLT 224 10/19/2018   GLUCOSE 158 (H) 10/19/2018   CHOL 171 07/26/2016   TRIG 85.0 07/26/2016   HDL 41.50 07/26/2016   LDLCALC 112 (H) 07/26/2016   ALT 35 10/19/2018   AST 40 10/19/2018   NA 135 10/19/2018   K 3.6 10/19/2018   CL 99 10/19/2018   CREATININE 1.09 10/19/2018   BUN 14 10/19/2018   CO2 26 10/19/2018   TSH 1.80 07/16/2017   PSA 3.04 07/07/2014   HGBA1C 8.2 (A) 10/07/2018   MICROALBUR <0.7 07/26/2016       Assessment & Plan:

## 2018-10-22 NOTE — Patient Instructions (Signed)
Please take all new medication as prescribed - the antibiotic, and cough medicine as needed  Please drink more fluids such as Gatorade for hydration  Please continue all other medications as before, and refills have been done if requested.  Please have the pharmacy call with any other refills you may need.  Please keep your appointments with your specialists as you may have planned

## 2018-10-22 NOTE — Assessment & Plan Note (Signed)
stable overall by history and exam, recent data reviewed with pt, and pt to continue medical treatment as before,  to f/u any worsening symptoms or concerns  

## 2018-10-22 NOTE — Assessment & Plan Note (Signed)
Mild to mod, c/w bronchitis vs pna, declines cxr, for antibx course, cough med prn,  to f/u any worsening symptoms or concerns 

## 2018-11-02 LAB — CUP PACEART REMOTE DEVICE CHECK
Date Time Interrogation Session: 20191229023953
Implantable Pulse Generator Implant Date: 20170512

## 2018-11-03 ENCOUNTER — Ambulatory Visit (INDEPENDENT_AMBULATORY_CARE_PROVIDER_SITE_OTHER): Payer: Medicare Other

## 2018-11-03 DIAGNOSIS — R55 Syncope and collapse: Secondary | ICD-10-CM

## 2018-11-03 NOTE — Progress Notes (Signed)
[facility] Summary Report / Loop Recorder 

## 2018-11-10 DIAGNOSIS — E1122 Type 2 diabetes mellitus with diabetic chronic kidney disease: Secondary | ICD-10-CM | POA: Diagnosis not present

## 2018-11-10 DIAGNOSIS — N183 Chronic kidney disease, stage 3 (moderate): Secondary | ICD-10-CM | POA: Diagnosis not present

## 2018-11-10 DIAGNOSIS — N289 Disorder of kidney and ureter, unspecified: Secondary | ICD-10-CM | POA: Diagnosis not present

## 2018-11-11 DIAGNOSIS — J301 Allergic rhinitis due to pollen: Secondary | ICD-10-CM | POA: Diagnosis not present

## 2018-11-11 DIAGNOSIS — J3089 Other allergic rhinitis: Secondary | ICD-10-CM | POA: Diagnosis not present

## 2018-11-16 LAB — CUP PACEART REMOTE DEVICE CHECK
Date Time Interrogation Session: 20191126013955
Implantable Pulse Generator Implant Date: 20170512

## 2018-11-17 DIAGNOSIS — J3089 Other allergic rhinitis: Secondary | ICD-10-CM | POA: Diagnosis not present

## 2018-11-17 DIAGNOSIS — J301 Allergic rhinitis due to pollen: Secondary | ICD-10-CM | POA: Diagnosis not present

## 2018-11-20 DIAGNOSIS — J3089 Other allergic rhinitis: Secondary | ICD-10-CM | POA: Diagnosis not present

## 2018-11-20 DIAGNOSIS — J301 Allergic rhinitis due to pollen: Secondary | ICD-10-CM | POA: Diagnosis not present

## 2018-11-26 ENCOUNTER — Encounter: Payer: Self-pay | Admitting: Gastroenterology

## 2018-11-26 ENCOUNTER — Ambulatory Visit (INDEPENDENT_AMBULATORY_CARE_PROVIDER_SITE_OTHER): Payer: Medicare Other | Admitting: Gastroenterology

## 2018-11-26 VITALS — BP 110/70 | HR 64 | Ht 75.0 in | Wt 212.0 lb

## 2018-11-26 DIAGNOSIS — R1033 Periumbilical pain: Secondary | ICD-10-CM | POA: Diagnosis not present

## 2018-11-26 DIAGNOSIS — K219 Gastro-esophageal reflux disease without esophagitis: Secondary | ICD-10-CM

## 2018-11-26 DIAGNOSIS — J3089 Other allergic rhinitis: Secondary | ICD-10-CM | POA: Diagnosis not present

## 2018-11-26 DIAGNOSIS — J301 Allergic rhinitis due to pollen: Secondary | ICD-10-CM | POA: Diagnosis not present

## 2018-11-26 NOTE — Progress Notes (Signed)
    History of Present Illness: This is a 84 year old male with periumbilical abdominal pain last month that has resolved. He had bronchitis with an ongoing cough then abdominal pain, bloating developed. Bronchitis resolved. Cough resolved. Abdominal pain resolved. Appetite was decreased and returned to normal a couple weeks ago. No change in bowel function. No GI symptoms noted in past few weeks. Abd/pelvic CT from 01/31/2018 reviewed. Blood work and UA from 10/19/2018 reviewed. Denies weight loss, constipation, diarrhea, change in stool caliber, melena, hematochezia, nausea, vomiting, dysphagia, reflux symptoms, chest pain.  Current Medications, Allergies, Past Medical History, Past Surgical History, Family History and Social History were reviewed in [employer] record.  Physical Exam: General: Well developed, well nourished, no acute distress Head: Normocephalic and atraumatic Eyes:  sclerae anicteric, EOMI Ears: Normal auditory acuity Mouth: No deformity or lesions Lungs: Clear throughout to auscultation Heart: Regular rate and rhythm; no murmurs, rubs or bruits Abdomen: Soft, non tender and non distended. No masses, hepatosplenomegaly or hernias noted. Normal Bowel sounds Rectal: Not done Musculoskeletal: Symmetrical with no gross deformities  Pulses:  Normal pulses noted Extremities: No clubbing, cyanosis, edema or deformities noted Neurological: Alert oriented x 4, grossly nonfocal Psychological:  Alert and cooperative. Normal mood and affect   Assessment and Recommendations:  1.  Periumbilical abdominal pain, resolved. Suspected muscular pain secondary to cough. If symptoms return consider a trial of an anti-spasmotic. Follow up with PCP. GI follow up prn.   2.  GERD. Continue omeprazole 40 mg po qd. Follow up with PCP.

## 2018-11-26 NOTE — Patient Instructions (Signed)
Follow up with your primary care physician

## 2018-12-04 ENCOUNTER — Ambulatory Visit (INDEPENDENT_AMBULATORY_CARE_PROVIDER_SITE_OTHER): Payer: Medicare Other

## 2018-12-04 DIAGNOSIS — R55 Syncope and collapse: Secondary | ICD-10-CM | POA: Diagnosis not present

## 2018-12-05 LAB — CUP PACEART REMOTE DEVICE CHECK
Implantable Pulse Generator Implant Date: 20170512
MDC IDC SESS DTM: 20200131033953

## 2018-12-10 DIAGNOSIS — J3089 Other allergic rhinitis: Secondary | ICD-10-CM | POA: Diagnosis not present

## 2018-12-10 DIAGNOSIS — J301 Allergic rhinitis due to pollen: Secondary | ICD-10-CM | POA: Diagnosis not present

## 2018-12-11 NOTE — Progress Notes (Signed)
[facility] Summary Report / Loop Recorder 

## 2018-12-17 DIAGNOSIS — J3089 Other allergic rhinitis: Secondary | ICD-10-CM | POA: Diagnosis not present

## 2018-12-17 DIAGNOSIS — J301 Allergic rhinitis due to pollen: Secondary | ICD-10-CM | POA: Diagnosis not present

## 2018-12-24 DIAGNOSIS — J301 Allergic rhinitis due to pollen: Secondary | ICD-10-CM | POA: Diagnosis not present

## 2018-12-24 DIAGNOSIS — J3089 Other allergic rhinitis: Secondary | ICD-10-CM | POA: Diagnosis not present

## 2018-12-31 DIAGNOSIS — J3089 Other allergic rhinitis: Secondary | ICD-10-CM | POA: Diagnosis not present

## 2018-12-31 DIAGNOSIS — J301 Allergic rhinitis due to pollen: Secondary | ICD-10-CM | POA: Diagnosis not present

## 2019-01-06 ENCOUNTER — Ambulatory Visit (INDEPENDENT_AMBULATORY_CARE_PROVIDER_SITE_OTHER): Payer: Medicare Other | Admitting: *Deleted

## 2019-01-06 ENCOUNTER — Other Ambulatory Visit: Payer: Self-pay

## 2019-01-06 ENCOUNTER — Encounter: Payer: Self-pay | Admitting: Endocrinology

## 2019-01-06 ENCOUNTER — Ambulatory Visit (INDEPENDENT_AMBULATORY_CARE_PROVIDER_SITE_OTHER): Payer: Medicare Other | Admitting: Endocrinology

## 2019-01-06 VITALS — BP 130/78 | HR 76 | Ht 75.0 in | Wt 217.0 lb

## 2019-01-06 DIAGNOSIS — R55 Syncope and collapse: Secondary | ICD-10-CM

## 2019-01-06 DIAGNOSIS — E119 Type 2 diabetes mellitus without complications: Secondary | ICD-10-CM

## 2019-01-06 LAB — POCT GLYCOSYLATED HEMOGLOBIN (HGB A1C): HEMOGLOBIN A1C: 8.2 % — AB (ref 4.0–5.6)

## 2019-01-06 MED ORDER — METFORMIN HCL ER 500 MG PO TB24: 2000.0000 mg | ORAL_TABLET | Freq: Every day | ORAL | 3 refills | Status: DC

## 2019-01-06 NOTE — Progress Notes (Signed)
Subjective:    Patient ID: Joseph Rocha, male    DOB: [birth year], 84 y.o.   MRN: [id number]  HPI Pt returns for f/u of diabetes mellitus: DM type: 2 Dx'ed: 0102 Complications: polyneuropathy and renal insuff Therapy: metformin DKA: never Severe hypoglycemia: never Pancreatitis: never Pancreatic imaging: normal on 2019 CT Other: he has never taken insulin Interval history: pt states he feels well in general.  E takes metformin as rx'ed Past Medical History:  Diagnosis Date  . Allergy   . Anemia   . Diabetes mellitus   . Elevated PSA   . Hyperlipidemia   . Hypertension   . Hypogonadism male   . OA (osteoarthritis)   . Pituitary abnormality ([place]) 2004    Past Surgical History:  Procedure Laterality Date  . DOPPLER ECHOCARDIOGRAPHY  12/04/2001  . ELECTROCARDIOGRAM  03/25/2006  . EP IMPLANTABLE DEVICE N/A 03/16/2016   Procedure: Loop Recorder Insertion;  Surgeon: Deboraha Sprang, MD;  Location: [facility];  Service: Cardiovascular;  Laterality: N/A;  . KNEE ARTHROSCOPY Left   . LUMBAR DISC SURGERY    . MENISCUS REPAIR Right may 2015  . TONSILLECTOMY    . TRANSPHENOIDAL / TRANSNASAL HYPOPHYSECTOMY / RESECTION PITUITARY TUMOR  2005    Social History   Socioeconomic History  . Marital status: Married    Spouse name: Not on file  . Number of children: 2  . Years of education: Not on file  . Highest education level: Not on file  Occupational History  . Occupation: retired    Fish farm manager: RETIRED  Social Needs  . Financial resource strain: Not on file  . Food insecurity:    Worry: Not on file    Inability: Not on file  . Transportation needs:    Medical: Not on file    Non-medical: Not on file  Tobacco Use  . Smoking status: Former Smoker    Last attempt to quit: 02/22/1957    Years since quitting: 61.9  . Smokeless tobacco: Never Used  Substance and Sexual Activity  . Alcohol use: No  . Drug use: No  . Sexual activity: Not Currently  Lifestyle  .  Physical activity:    Days per week: Not on file    Minutes per session: Not on file  . Stress: Not on file  Relationships  . Social connections:    Talks on phone: Not on file    Gets together: Not on file    Attends religious service: Not on file    Active member of club or organization: Not on file    Attends meetings of clubs or organizations: Not on file    Relationship status: Not on file  . Intimate partner violence:    Fear of current or ex partner: Not on file    Emotionally abused: Not on file    Physically abused: Not on file    Forced sexual activity: Not on file  Other Topics Concern  . Not on file  Social History Narrative   Lives with wife.  They have two grown children.   He is retired from the [employer].    Current Outpatient Medications on File Prior to Visit  Medication Sig Dispense Refill  . calcium carbonate (OS-CAL) 600 MG TABS Take 600 mg by mouth daily.    . cetirizine-pseudoephedrine (ZYRTEC-D) 5-120 MG tablet Take 1 tablet by mouth daily. 90 tablet 2  . doxycycline (VIBRA-TABS) 100 MG tablet Take 1 tablet (100 mg total) by  mouth 2 (two) times daily. 20 tablet 0  . EPINEPHrine 0.3 mg/0.3 mL IJ SOAJ injection Inject 0.3 mg into the muscle once as needed (allergic reaction).   0  . finasteride (PROSCAR) 5 MG tablet TAKE 1 TABLET DAILY 90 tablet 0  . fludrocortisone (FLORINEF) 0.1 MG tablet Take 1 tablet (0.1 mg total) by mouth daily. 90 tablet 2  . fluticasone (FLONASE) 50 MCG/ACT nasal spray Place 2 sprays into both nostrils daily. 16 g 6  . Fluticasone-Salmeterol (ADVAIR DISKUS) 100-50 MCG/DOSE AEPB Inhale 1 puff into the lungs 2 (two) times daily. 1 each 0  . folic acid (FOLVITE) 1 MG tablet TAKE 1 TABLET DAILY 90 tablet 2  . IRON PO Take by mouth.    Marland Kitchen KLOR-CON M20 20 MEQ tablet Take 20 mEq by mouth daily.     Marland Kitchen levothyroxine (SYNTHROID, LEVOTHROID) 50 MCG tablet Take 1 tablet by mouth daily.    Marland Kitchen loperamide (IMODIUM A-D) 2 MG tablet Take 1 tablet  (2 mg total) by mouth 2 (two) times daily as needed for diarrhea or loose stools. 30 tablet 1  . methocarbamol (ROBAXIN) 500 MG tablet TAKE 1 TABLET AT BEDTIME ASNEEDED FOR MUSCLE SPASM 90 tablet 0  . Multiple Vitamins-Minerals (CENTRUM SILVER PO) Take 1 tablet by mouth daily.      . Omega-3 Fatty Acids (FISH OIL) 1000 MG CAPS Take 1 capsule by mouth daily.    Marland Kitchen omeprazole (PRILOSEC) 40 MG capsule TAKE 1 CAPSULE BY MOUTH EVERY DAY 30 capsule 1  . rosuvastatin (CRESTOR) 5 MG tablet TAKE 1 TABLET DAILY 90 tablet 1  . Tamsulosin HCl (FLOMAX) 0.4 MG CAPS Take 0.4 mg by mouth daily.      . Testosterone 30 MG/ACT SOLN Place 2 Pump onto the skin daily.    . traMADol (ULTRAM) 50 MG tablet Take 1 tablet (50 mg total) by mouth every 6 (six) hours as needed. 15 tablet 0   No current facility-administered medications on file prior to visit.     Allergies  Allergen Reactions  . Lipitor [Atorvastatin] Other (See Comments)    Myalgias, cramps in hand    Family History  Problem Relation Age of Onset  . Cancer Brother        uncertain type  . Diabetes Mellitus II Mother        Deceased, 53s  . Heart attack Father   . Healthy Daughter     BP 130/78 (BP Location: Right Arm, Patient Position: Sitting, Cuff Size: Large)   Pulse 76   Ht 6\' 3"  (1.905 m)   Wt 217 lb (98.4 kg)   SpO2 94%   BMI 27.12 kg/m    Review of Systems He has gained a few lbs.      Objective:   Physical Exam VITAL SIGNS:  See vs page GENERAL: no distress Pulses: dorsalis pedis intact bilat.   MSK: no deformity of the feet.   CV: trace bilat leg edema.   Skin:  no ulcer on the feet.  normal color and temp on the feet.  Old healed surgical scars on the right foot (hammer toe repairs) Neuro: sensation is intact to touch on the feet  Lab Results  Component Value Date   CREATININE 1.09 10/19/2018   BUN 14 10/19/2018   NA 135 10/19/2018   K 3.6 10/19/2018   CL 99 10/19/2018   CO2 26 10/19/2018     Lab Results    Component Value Date   HGBA1C 8.2 (A)  01/06/2019       Assessment & Plan:  Type 2 DM, with PN: he needs increased rx.  Edema: this limits rx options.   Patient Instructions  I have sent a prescription to your pharmacy, to increase the metformin.   Please come back for a follow-up appointment in 2 months.

## 2019-01-06 NOTE — Patient Instructions (Addendum)
I have sent a prescription to your pharmacy, to increase the metformin.   Please come back for a follow-up appointment in 2 months.

## 2019-01-07 DIAGNOSIS — J301 Allergic rhinitis due to pollen: Secondary | ICD-10-CM | POA: Diagnosis not present

## 2019-01-07 DIAGNOSIS — J3089 Other allergic rhinitis: Secondary | ICD-10-CM | POA: Diagnosis not present

## 2019-01-07 LAB — CUP PACEART REMOTE DEVICE CHECK
Date Time Interrogation Session: 20200304054101
Implantable Pulse Generator Implant Date: 20170512

## 2019-01-13 ENCOUNTER — Encounter (HOSPITAL_COMMUNITY): Payer: Self-pay | Admitting: Emergency Medicine

## 2019-01-13 ENCOUNTER — Other Ambulatory Visit: Payer: Self-pay

## 2019-01-13 ENCOUNTER — Emergency Department (HOSPITAL_COMMUNITY)
Admission: EM | Admit: 2019-01-13 | Discharge: 2019-01-13 | Disposition: A | Discharge status: Home / Self Care | Payer: Medicare Other | Attending: Emergency Medicine | Admitting: Emergency Medicine

## 2019-01-13 ENCOUNTER — Emergency Department (HOSPITAL_COMMUNITY): Payer: Medicare Other

## 2019-01-13 DIAGNOSIS — I10 Essential (primary) hypertension: Secondary | ICD-10-CM | POA: Insufficient documentation

## 2019-01-13 DIAGNOSIS — Z79899 Other long term (current) drug therapy: Secondary | ICD-10-CM | POA: Diagnosis not present

## 2019-01-13 DIAGNOSIS — R112 Nausea with vomiting, unspecified: Secondary | ICD-10-CM | POA: Diagnosis not present

## 2019-01-13 DIAGNOSIS — I480 Paroxysmal atrial fibrillation: Secondary | ICD-10-CM | POA: Diagnosis not present

## 2019-01-13 DIAGNOSIS — I959 Hypotension, unspecified: Secondary | ICD-10-CM | POA: Diagnosis not present

## 2019-01-13 DIAGNOSIS — Z87891 Personal history of nicotine dependence: Secondary | ICD-10-CM | POA: Insufficient documentation

## 2019-01-13 DIAGNOSIS — R197 Diarrhea, unspecified: Secondary | ICD-10-CM | POA: Insufficient documentation

## 2019-01-13 DIAGNOSIS — R000 Tachycardia, unspecified: Secondary | ICD-10-CM | POA: Diagnosis not present

## 2019-01-13 DIAGNOSIS — E119 Type 2 diabetes mellitus without complications: Secondary | ICD-10-CM | POA: Insufficient documentation

## 2019-01-13 DIAGNOSIS — R609 Edema, unspecified: Secondary | ICD-10-CM | POA: Diagnosis not present

## 2019-01-13 DIAGNOSIS — R55 Syncope and collapse: Secondary | ICD-10-CM | POA: Diagnosis not present

## 2019-01-13 LAB — COMPREHENSIVE METABOLIC PANEL
ALT: 23 U/L (ref 0–44)
AST: 29 U/L (ref 15–41)
Albumin: 3.7 g/dL (ref 3.5–5.0)
Alkaline Phosphatase: 59 U/L (ref 38–126)
Anion gap: 10 (ref 5–15)
BUN: 16 mg/dL (ref 8–23)
CO2: 24 mmol/L (ref 22–32)
Calcium: 8.9 mg/dL (ref 8.9–10.3)
Chloride: 102 mmol/L (ref 98–111)
Creatinine, Ser: 1.12 mg/dL (ref 0.61–1.24)
GFR calc Af Amer: >60 mL/min (ref >60)
GFR calc non Af Amer: >60 mL/min (ref >60)
Glucose, Bld: 163 mg/dL — ABNORMAL HIGH (ref 70–99)
Potassium: 3.2 mmol/L — ABNORMAL LOW (ref 3.5–5.1)
Sodium: 136 mmol/L (ref 135–145)
Total Bilirubin: 0.8 mg/dL (ref 0.3–1.2)
Total Protein: 7 g/dL (ref 6.5–8.1)

## 2019-01-13 LAB — CBC WITH DIFFERENTIAL/PLATELET
Abs Immature Granulocytes: 0.02 10*3/uL (ref 0.00–0.07)
Basophils Absolute: 0 10*3/uL (ref 0.0–0.1)
Basophils Relative: 0 %
Eosinophils Absolute: 0.2 10*3/uL (ref 0.0–0.5)
Eosinophils Relative: 3 %
HCT: 37.2 % — ABNORMAL LOW (ref 39.0–52.0)
Hemoglobin: 12.1 g/dL — ABNORMAL LOW (ref 13.0–17.0)
Immature Granulocytes: 0 %
Lymphocytes Relative: 22 %
Lymphs Abs: 1.6 10*3/uL (ref 0.7–4.0)
MCH: 29.4 pg (ref 26.0–34.0)
MCHC: 32.5 g/dL (ref 30.0–36.0)
MCV: 90.5 fL (ref 80.0–100.0)
Monocytes Absolute: 0.8 10*3/uL (ref 0.1–1.0)
Monocytes Relative: 12 %
Neutro Abs: 4.4 10*3/uL (ref 1.7–7.7)
Neutrophils Relative %: 63 %
Platelets: 140 10*3/uL — ABNORMAL LOW (ref 150–400)
RBC: 4.11 MIL/uL — ABNORMAL LOW (ref 4.22–5.81)
RDW: 13.5 % (ref 11.5–15.5)
WBC: 7 10*3/uL (ref 4.0–10.5)
nRBC: 0 % (ref 0.0–0.2)

## 2019-01-13 LAB — URINALYSIS, ROUTINE W REFLEX MICROSCOPIC
Bilirubin Urine: NEGATIVE
Glucose, UA: NEGATIVE mg/dL
Hgb urine dipstick: NEGATIVE
Ketones, ur: 5 mg/dL — AB
Leukocytes,Ua: NEGATIVE
Nitrite: NEGATIVE
Protein, ur: NEGATIVE mg/dL
Specific Gravity, Urine: 1.018 (ref 1.005–1.030)
pH: 5 (ref 5.0–8.0)

## 2019-01-13 LAB — LIPASE, BLOOD: Lipase: 22 U/L (ref 11–51)

## 2019-01-13 LAB — I-STAT TROPONIN, ED: Troponin i, poc: 0 ng/mL (ref 0.00–0.08)

## 2019-01-13 MED ORDER — POTASSIUM CHLORIDE CRYS ER 20 MEQ PO TBCR
40.0000 meq | EXTENDED_RELEASE_TABLET | Freq: Once | ORAL | Status: AC
  Administered 2019-01-13: 40 meq via ORAL
  Filled 2019-01-13: qty 2

## 2019-01-13 MED ORDER — SODIUM CHLORIDE 0.9 % IV BOLUS
500.0000 mL | Freq: Once | INTRAVENOUS | Status: AC
  Administered 2019-01-13: 500 mL via INTRAVENOUS

## 2019-01-13 MED ORDER — ONDANSETRON 4 MG PO TBDP: 4.0000 mg | ORAL_TABLET | Freq: Three times a day (TID) | ORAL | 0 refills | Status: AC | PRN

## 2019-01-13 NOTE — ED Provider Notes (Signed)
[facility] DEPART[facility] Provider Note   CSN: [id number] Arrival date & time: 01/13/19  0046    History   Chief Complaint Chief Complaint  Patient presents with  . Atrial Fibrillation  . Diarrhea  . Emesis    HPI Joseph Rocha is a 84 y.o. male with history of diabetes mellitus, HLD, HTN presents today for evaluation of acute onset, resolved syncopal episode.  He reports that for the last 3 days or so he has had 2-3 episodes of watery nonbloody diarrhea.  30 minutes prior to his syncopal episode he reports feeling "sick ".  He states that "my stomach was turning and I felt nauseated ".  He reports that he then went to the bathroom and while vomiting he had a syncopal episode lasting a few seconds and was found by his wife.  He thinks he struck his head on the toilet paper roll holder.  He denies any headache, vision changes, numbness, weakness, chest pain, or shortness of breath.  He reports that his nausea has improved.  He denies any abdominal pain at this time.  Per EMS report the patient was in A. fib with RVR and hypotensive with a heart rate of 158 and a blood pressure of 90/46 when he was feeling nauseated or vomiting.  He was given Zofran with improvement in his heart rate and blood pressure.  EKG here shows normal sinus rhythm.  He has an implantable loop recorder due to syncopal episodes.  His electrophysiologist is Dr. Caryl Comes.    The history is provided by the patient.    Past Medical History:  Diagnosis Date  . Allergy   . Anemia   . Diabetes mellitus   . Elevated PSA   . Hyperlipidemia   . Hypertension   . Hypogonadism male   . OA (osteoarthritis)   . Pituitary abnormality [facility]) 2004    Patient Active Problem List   Diagnosis Date Noted  . Cough 10/13/2018  . Renal insufficiency 08/01/2018  . Rash 05/27/2018  . Hyponatremia 05/27/2018  . Diarrhea 02/18/2018  . Constipation 12/05/2017  . Acute bronchitis 10/02/2017  . Hypocalcemia  04/29/2017  . Vitamin D intoxication 04/29/2017  . Low back pain 11/16/2016  . Edema 10/03/2016  . Dyspnea 10/03/2016  . Hypothyroidism 07/26/2016  . Chest wall pain 04/06/2016  . Numbness 04/06/2016  . Contusion of right foot 12/12/2015  . Hypotension 11/10/2014  . Weight loss 07/07/2014  . Sick sinus syndrome ([place]) 03/02/2014  . Nausea with vomiting 03/25/2013  . Bilateral leg weakness 07/21/2012  . Encounter for long-term (current) use of other medications 02/13/2012  . Pituitary abnormality ([place]) 02/23/2011  . Lymphadenitis, acute 02/23/2011  . BRADYCARDIA 10/17/2009  . DIZZINESS 10/17/2009  . [place] DISEASE, CERVICAL 12/28/2008  . SINUSITIS- ACUTE-NOS 11/22/2008  . Hypogonadism male 09/01/2008  . 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 FOOT DROP, RIGHT 09/01/2008  . PSA, INCREASED 09/01/2008  . PITUITARY MACROADENOMA 05/06/2008  . HYPERCHOLESTEROLEMIA 05/06/2008  . SYNCOPE 05/06/2008  . Diabetes ([place]) 05/14/2007  . Anemia 05/14/2007  . ALLERGIC RHINITIS 05/14/2007  . DIVERTICULOSIS, COLON 05/14/2007  . OSTEOARTHRITIS 05/14/2007    Past Surgical History:  Procedure Laterality Date  . DOPPLER ECHOCARDIOGRAPHY  12/04/2001  . ELECTROCARDIOGRAM  03/25/2006  . EP IMPLANTABLE DEVICE N/A 03/16/2016   Procedure: Loop Recorder Insertion;  Surgeon: Deboraha Sprang, MD;  Location: [facility];  Service: Cardiovascular;  Laterality: N/A;  . KNEE ARTHROSCOPY Left   .  LUMBAR DISC SURGERY    . MENISCUS REPAIR Right may 2015  . TONSILLECTOMY    . TRANSPHENOIDAL / TRANSNASAL HYPOPHYSECTOMY / RESECTION PITUITARY TUMOR  2005        Home Medications    Prior to Admission medications   Medication Sig Start Date End Date Taking? Authorizing Provider  calcium carbonate (OS-CAL) 600 MG TABS Take 600 mg by mouth daily.    [provider]  cetirizine-pseudoephedrine (ZYRTEC-D) 5-120 MG tablet Take 1 tablet by mouth daily. 05/09/17   Renato Shin, MD    doxycycline (VIBRA-TABS) 100 MG tablet Take 1 tablet (100 mg total) by mouth 2 (two) times daily. 10/22/18   Biagio Borg, MD  EPINEPHrine 0.3 mg/0.3 mL IJ SOAJ injection Inject 0.3 mg into the muscle once as needed (allergic reaction).  11/08/16   [provider]  finasteride (PROSCAR) 5 MG tablet TAKE 1 TABLET DAILY 09/17/18   Renato Shin, MD  fludrocortisone (FLORINEF) 0.1 MG tablet Take 1 tablet (0.1 mg total) by mouth daily. 02/18/18   Renato Shin, MD  fluticasone (FLONASE) 50 MCG/ACT nasal spray Place 2 sprays into both nostrils daily. 10/15/18   Hoyt Koch, MD  Fluticasone-Salmeterol (ADVAIR DISKUS) 100-50 MCG/DOSE AEPB Inhale 1 puff into the lungs 2 (two) times daily. 10/07/18   Renato Shin, MD  folic acid (FOLVITE) 1 MG tablet TAKE 1 TABLET DAILY 06/24/17   Renato Shin, MD  IRON PO Take by mouth.    [provider]  KLOR-CON M20 20 MEQ tablet Take 20 mEq by mouth daily.  03/15/13   [provider]  levothyroxine (SYNTHROID, LEVOTHROID) 50 MCG tablet Take 1 tablet by mouth daily.    [provider]  loperamide (IMODIUM A-D) 2 MG tablet Take 1 tablet (2 mg total) by mouth 2 (two) times daily as needed for diarrhea or loose stools. 01/10/18   Levin Erp, PA  metFORMIN (GLUCOPHAGE-XR) 500 MG 24 hr tablet Take 4 tablets (2,000 mg total) by mouth daily with breakfast. 01/06/19   Renato Shin, MD  methocarbamol (ROBAXIN) 500 MG tablet TAKE 1 TABLET AT BEDTIME ASNEEDED FOR MUSCLE SPASM 09/17/18   Renato Shin, MD  Multiple Vitamins-Minerals (CENTRUM SILVER PO) Take 1 tablet by mouth daily.      [provider]  Omega-3 Fatty Acids (FISH OIL) 1000 MG CAPS Take 1 capsule by mouth daily.    [provider]  omeprazole (PRILOSEC) 40 MG capsule TAKE 1 CAPSULE BY MOUTH EVERY DAY 09/08/18   Renato Shin, MD  ondansetron (ZOFRAN ODT) 4 MG disintegrating tablet Take 1 tablet (4 mg total) by mouth every 8 (eight) hours as needed  for up to 3 days for nausea or vomiting. 01/13/19 01/16/19  Rodell Perna A, PA-C  rosuvastatin (CRESTOR) 5 MG tablet TAKE 1 TABLET DAILY 07/25/18   Renato Shin, MD  Tamsulosin HCl (FLOMAX) 0.4 MG CAPS Take 0.4 mg by mouth daily.      [provider]  Testosterone 30 MG/ACT SOLN Place 2 Pump onto the skin daily.    [provider]  traMADol (ULTRAM) 50 MG tablet Take 1 tablet (50 mg total) by mouth every 6 (six) hours as needed. 05/07/18   Renato Shin, MD    Family History Family History  Problem Relation Age of Onset  . Cancer Brother        uncertain type  . Diabetes Mellitus II Mother        Deceased, 1s  . Heart attack Father   .  Healthy Daughter     Social History Social History   Tobacco Use  . Smoking status: Former Smoker    Last attempt to quit: 02/22/1957    Years since quitting: 61.9  . Smokeless tobacco: Never Used  Substance Use Topics  . Alcohol use: No  . Drug use: No     Allergies   Lipitor [atorvastatin]   Review of Systems Review of Systems  Constitutional: Negative for chills and fever.  Respiratory: Negative for cough and shortness of breath.   Cardiovascular: Negative for chest pain.  Gastrointestinal: Positive for diarrhea, nausea and vomiting. Negative for abdominal pain.  Genitourinary: Negative for dysuria and hematuria.  Neurological: Positive for syncope. Negative for weakness, numbness and headaches.  All other systems reviewed and are negative.    Physical Exam Updated Vital Signs BP (!) 159/87   Pulse (!) 57   Resp 18   Ht 6\' 3"  (1.905 m)   Wt 98.4 kg   SpO2 99%   BMI 27.11 kg/m   Physical Exam Vitals signs and nursing note reviewed.  Constitutional:      General: He is not in acute distress.    Appearance: He is well-developed.  HENT:     Head: Normocephalic.     Comments: 1 cm hematoma noted to the left lateral orbit with 4 mm overlying superficial laceration.  Bleeding controlled.  No restriction of EOMs  or pain with EOMs.  No tenderness to palpation of the face or skull.  No crepitus noted.  No battle signs, raccoon eyes, or rhinorrhea.  No hemotympanum bilaterally. Eyes:     General:        Right eye: No discharge.        Left eye: No discharge.     Conjunctiva/sclera: Conjunctivae normal.  Neck:     Musculoskeletal: Normal range of motion and neck supple.     Vascular: No JVD.     Trachea: No tracheal deviation.     Comments: No midline spine TTP, no paraspinal muscle tenderness, no deformity, crepitus, or step-off noted  Cardiovascular:     Rate and Rhythm: Normal rate.     Pulses: Normal pulses.     Heart sounds: Normal heart sounds.  Pulmonary:     Effort: Pulmonary effort is normal.  Abdominal:     General: Abdomen is flat. Bowel sounds are normal. There is no distension.     Palpations: Abdomen is soft.     Tenderness: There is no abdominal tenderness. There is no guarding or rebound.  Musculoskeletal: Normal range of motion.        General: No tenderness.     Comments: No midline spine TTP, no paraspinal muscle tenderness, no deformity, crepitus, or step-off noted.  5/5 strength of BUE and BLE major muscle groups.  Skin:    General: Skin is warm and dry.     Findings: No erythema.  Neurological:     General: No focal deficit present.     Mental Status: He is alert and oriented to person, place, and time.     Cranial Nerves: No cranial nerve deficit.     Sensory: No sensory deficit.     Motor: No weakness.  Psychiatric:        Behavior: Behavior normal.      ED Treatments / Results  Labs (all labs ordered are listed, but only abnormal results are displayed) Labs Reviewed  COMPREHENSIVE METABOLIC PANEL - Abnormal; Notable for the following components:  Result Value   Potassium 3.2 (*)    Glucose, Bld 163 (*)    All other components within normal limits  CBC WITH DIFFERENTIAL/PLATELET - Abnormal; Notable for the following components:   RBC 4.11 (*)     Hemoglobin 12.1 (*)    HCT 37.2 (*)    Platelets 140 (*)    All other components within normal limits  URINALYSIS, ROUTINE W REFLEX MICROSCOPIC - Abnormal; Notable for the following components:   Ketones, ur 5 (*)    All other components within normal limits  LIPASE, BLOOD  I-STAT TROPONIN, ED    EKG EKG Interpretation  Date/Time:  Tuesday January 13 2019 01:07:21 EDT Ventricular Rate:  62 PR Interval:    QRS Duration: 98 QT Interval:  444 QTC Calculation: 451 R Axis:   -26 Text Interpretation:  Sinus rhythm Prolonged PR interval Borderline left axis deviation Confirmed by Randal Buba, April ([id number]) on 01/13/2019 1:23:11 AM   Radiology Ct Head Wo Contrast  Result Date: 01/13/2019 CLINICAL DATA:  Syncopal episode while vomiting tonight. EXAM: CT HEAD WITHOUT CONTRAST TECHNIQUE: Contiguous axial images were obtained from the base of the skull through the vertex without intravenous contrast. COMPARISON:  04/22/2017 FINDINGS: Brain: Diffuse cerebral atrophy. Ventricular dilatation consistent with central atrophy. Low-attenuation changes in the deep white matter consistent small vessel ischemia. No mass effect or midline shift. No abnormal extra-axial fluid collections. Gray-white matter junctions are distinct. Basal cisterns are not effaced. No acute intracranial hemorrhage. Vascular: Intracranial arterial calcifications are present. Skull: Calvarium appears intact. No acute depressed skull fractures. Sinuses/Orbits: Paranasal sinuses and mastoid air cells are clear. Other: None. IMPRESSION: No acute intracranial abnormalities. Chronic atrophy and small vessel ischemic changes. Electronically Signed   By: Lucienne Capers M.D.   On: 01/13/2019 01:49    Procedures Procedures (including critical care time)  Medications Ordered in ED Medications  sodium chloride 0.9 % bolus 500 mL (0 mLs Intravenous Stopped 01/13/19 0218)  potassium chloride SA (K-DUR,KLOR-CON) CR tablet 40 mEq (40 mEq Oral  Given 01/13/19 0233)     Initial Impression / Assessment and Plan / ED Course  I have reviewed the triage vital signs and the nursing notes.  Pertinent labs & imaging results that were available during my care of the patient were reviewed by me and considered in my medical decision making (see chart for details).        Patient presenting for evaluation of diarrhea for a few days, syncopal episode while vomiting just prior to arrival.  He is afebrile, vital signs are stable in the ED.  He is nontoxic in appearance.  Normal neurologic examination.  He was in A. fib with EMS but while in the ED he has been in normal sinus rhythm on telemetry and on EKG.  Lab work reviewed by me shows mild anemia, mild hypokalemia, no renal insufficiency or metabolic derangements otherwise.  Potassium replenished in the ED. UA showed mild ketonuria suggesting dehydration but does not suggest UTI or nephrolithiasis.  Troponin is negative and I doubt ACS/MI.  His head CT shows no acute intracranial abnormalities, no evidence of skull fracture, ICH, SAH, or facial fracture.  No evidence of ocular nerve entrapment or globe rupture.  His loop recorder was interrogated and per [employer] the patient was in A. fib for approximately 45 minutes corresponding to his episode of nausea/vomiting.  He did spontaneously convert on his own without any intervention in the ED.  He was given a small fluid bolus in the  ED.  Abdomen is soft and nontender, doubt acute surgical abdominal pathology.  Will discharge with Zofran as needed for nausea and vomiting.  Suspect he likely has a viral gastroenteritis.  On reassessment, patient resting comfortably in no apparent distress.  Serial abdominal examinations remain benign.  He is tolerated p.o. fluids without difficulty.  Discussed first pushing fluids, advancing diet slowly, and close follow-up with his PCP and electrophysiologist.  Discussed strict ED return precautions.  Patient and wife  verbalized understanding of and agreement with plan and patient stable for discharge home at this time. Final Clinical Impressions(s) / ED Diagnoses   Final diagnoses:  Nausea vomiting and diarrhea  Paroxysmal atrial fibrillation [facility])    ED Discharge Orders         Ordered    ondansetron (ZOFRAN ODT) 4 MG disintegrating tablet  Every 8 hours PRN     01/13/19 0336           Renita Papa, PA-C 01/13/19 0416    Palumbo, April, MD 01/13/19 0500

## 2019-01-13 NOTE — Discharge Instructions (Signed)
1. Medications:Take Zofran as needed for nausea.  Let this medicine dissolve under your tongue and wait around 10 to 15 minutes before eating or drinking after taking this medication. 2. Treatment: rest, drink plenty of fluids, advance diet slowly.  Start with water and broth then advance to bland foods that will not upset your stomach such as crackers, mashed potatoes, and peanut butter. 3. Follow Up: Please followup with your primary doctor in 3 days for discussion of your diagnoses and further evaluation after today's visit; follow-up with your cardiologist for reevaluation of your abnormal heart rhythm.  Please return to the ER for persistent vomiting, chest pain, shortness of breath, passing out, high fevers or worsening symptoms

## 2019-01-13 NOTE — ED Notes (Signed)
Reviewed d/c instructions with pt, who verbalized understanding and had no outstanding questions. Pt armband & labels removed and placed in shred bin. Pt departed in NAD.

## 2019-01-13 NOTE — Progress Notes (Signed)
[facility] Summary Report / Loop Recorder 

## 2019-01-13 NOTE — ED Triage Notes (Signed)
BIB EMS from home. Pt reports loose stools x2 days, N/V tonight. Had single syncopal episode while kneeling in front of toilet, vomiting. Per EMS, pt noted to go into Afib w/RVR & hypotensive (HR 158, BP 90/46) when he would become sick and/or vomit. After being given zofran, pt's HR 66, BP 133/87. Hx Afib.

## 2019-01-13 NOTE — ED Notes (Signed)
ED Provider at bedside. 

## 2019-01-13 NOTE — ED Notes (Signed)
Patient transported to CT 

## 2019-02-03 ENCOUNTER — Other Ambulatory Visit: Payer: Self-pay | Admitting: Endocrinology

## 2019-02-05 ENCOUNTER — Other Ambulatory Visit: Payer: Self-pay | Admitting: Endocrinology

## 2019-02-09 ENCOUNTER — Ambulatory Visit (INDEPENDENT_AMBULATORY_CARE_PROVIDER_SITE_OTHER): Payer: Medicare Other | Admitting: *Deleted

## 2019-02-09 ENCOUNTER — Other Ambulatory Visit: Payer: Self-pay

## 2019-02-09 DIAGNOSIS — R55 Syncope and collapse: Secondary | ICD-10-CM | POA: Diagnosis not present

## 2019-02-09 LAB — CUP PACEART REMOTE DEVICE CHECK
Date Time Interrogation Session: 20200406121323
Implantable Pulse Generator Implant Date: 20170512

## 2019-02-10 ENCOUNTER — Other Ambulatory Visit: Payer: Self-pay | Admitting: Endocrinology

## 2019-02-12 ENCOUNTER — Telehealth: Payer: Self-pay | Admitting: Endocrinology

## 2019-02-12 ENCOUNTER — Other Ambulatory Visit: Payer: Self-pay | Admitting: Internal Medicine

## 2019-02-12 NOTE — Telephone Encounter (Signed)
Wants to know why his Omeprazole refill was decline by Dr Loanne Drilling

## 2019-02-12 NOTE — Telephone Encounter (Signed)
Pt was called and explained that this is a medication for a condition that is managed by PCP, which Dr. Loanne Drilling is not anymore.

## 2019-02-13 ENCOUNTER — Other Ambulatory Visit: Payer: Self-pay | Admitting: Internal Medicine

## 2019-02-16 MED ORDER — OMEPRAZOLE 40 MG PO CPDR: DELAYED_RELEASE_CAPSULE | ORAL | 0 refills | Status: DC

## 2019-02-16 NOTE — Progress Notes (Signed)
[facility] Summary Report / Loop Recorder 

## 2019-03-02 ENCOUNTER — Other Ambulatory Visit: Payer: Self-pay | Admitting: Endocrinology

## 2019-03-11 ENCOUNTER — Ambulatory Visit (INDEPENDENT_AMBULATORY_CARE_PROVIDER_SITE_OTHER): Payer: Medicare Other | Admitting: Endocrinology

## 2019-03-11 ENCOUNTER — Encounter: Payer: Self-pay | Admitting: Endocrinology

## 2019-03-11 DIAGNOSIS — E119 Type 2 diabetes mellitus without complications: Secondary | ICD-10-CM

## 2019-03-11 NOTE — Progress Notes (Signed)
Subjective:    Patient ID: Joseph Rocha, male    DOB: Apr 08, 1935, 84 y.o.   MRN: [id number]  HPI telehealth visit today via phone x 8 minutes.  Alternatives to telehealth are presented to this patient, and the patient agrees to the telehealth visit. Pt is advised of the cost of the visit, and agrees to this, also.   Patient is at home, and I am at the office.   Persons attending the telehealth visit: the patient and I Pt returns for f/u of diabetes mellitus: DM type: 2 Dx'ed: 1607 Complications: polyneuropathy and renal insuff.  Therapy: metformin DKA: never Severe hypoglycemia: never Pancreatitis: never Pancreatic imaging: normal on 2019 CT Other: he took insulin for a brief time, soon after dx.   Interval history: pt states he feels well in general.  he takes metformin as rx'ed.  He says cbg's are in the low-100's.   Past Medical History:  Diagnosis Date  . Allergy   . Anemia   . Diabetes mellitus   . Elevated PSA   . Hyperlipidemia   . Hypertension   . Hypogonadism male   . OA (osteoarthritis)   . Pituitary abnormality ([place]) 2004    Past Surgical History:  Procedure Laterality Date  . DOPPLER ECHOCARDIOGRAPHY  12/04/2001  . ELECTROCARDIOGRAM  03/25/2006  . EP IMPLANTABLE DEVICE N/A 03/16/2016   Procedure: Loop Recorder Insertion;  Surgeon: Deboraha Sprang, MD;  Location: [facility];  Service: Cardiovascular;  Laterality: N/A;  . KNEE ARTHROSCOPY Left   . LUMBAR DISC SURGERY    . MENISCUS REPAIR Right may 2015  . TONSILLECTOMY    . TRANSPHENOIDAL / TRANSNASAL HYPOPHYSECTOMY / RESECTION PITUITARY TUMOR  2005    Social History   Socioeconomic History  . Marital status: Married    Spouse name: Not on file  . Number of children: 2  . Years of education: Not on file  . Highest education level: Not on file  Occupational History  . Occupation: retired    Fish farm manager: RETIRED  Social Needs  . Financial resource strain: Not on file  . Food insecurity:   Worry: Not on file    Inability: Not on file  . Transportation needs:    Medical: Not on file    Non-medical: Not on file  Tobacco Use  . Smoking status: Former Smoker    Last attempt to quit: 02/22/1957    Years since quitting: 62.0  . Smokeless tobacco: Never Used  Substance and Sexual Activity  . Alcohol use: No  . Drug use: No  . Sexual activity: Not Currently  Lifestyle  . Physical activity:    Days per week: Not on file    Minutes per session: Not on file  . Stress: Not on file  Relationships  . Social connections:    Talks on phone: Not on file    Gets together: Not on file    Attends religious service: Not on file    Active member of club or organization: Not on file    Attends meetings of clubs or organizations: Not on file    Relationship status: Not on file  . Intimate partner violence:    Fear of current or ex partner: Not on file    Emotionally abused: Not on file    Physically abused: Not on file    Forced sexual activity: Not on file  Other Topics Concern  . Not on file  Social History Narrative   Lives with  wife.  They have two grown children.   He is retired from the [employer].    Current Outpatient Medications on File Prior to Visit  Medication Sig Dispense Refill  . calcium carbonate (OS-CAL) 600 MG TABS Take 600 mg by mouth daily.    . cetirizine-pseudoephedrine (ZYRTEC-D) 5-120 MG tablet Take 1 tablet by mouth daily. 90 tablet 2  . EPINEPHrine 0.3 mg/0.3 mL IJ SOAJ injection Inject 0.3 mg into the muscle once as needed (allergic reaction).   0  . finasteride (PROSCAR) 5 MG tablet TAKE 1 TABLET DAILY 90 tablet 0  . fludrocortisone (FLORINEF) 0.1 MG tablet Take 1 tablet (0.1 mg total) by mouth daily. 90 tablet 2  . fluticasone (FLONASE) 50 MCG/ACT nasal spray Place 2 sprays into both nostrils daily. 16 g 6  . Fluticasone-Salmeterol (ADVAIR DISKUS) 100-50 MCG/DOSE AEPB Inhale 1 puff into the lungs 2 (two) times daily. 1 each 0  . folic acid  (FOLVITE) 1 MG tablet TAKE 1 TABLET DAILY 90 tablet 2  . IRON PO Take by mouth.    Marland Kitchen KLOR-CON M20 20 MEQ tablet Take 20 mEq by mouth daily.     Marland Kitchen loperamide (IMODIUM A-D) 2 MG tablet Take 1 tablet (2 mg total) by mouth 2 (two) times daily as needed for diarrhea or loose stools. 30 tablet 1  . metFORMIN (GLUCOPHAGE-XR) 500 MG 24 hr tablet Take 4 tablets (2,000 mg total) by mouth daily with breakfast. 360 tablet 3  . methocarbamol (ROBAXIN) 500 MG tablet TAKE 1 TABLET AT BEDTIME ASNEEDED FOR MUSCLE SPASM 90 tablet 0  . Multiple Vitamins-Minerals (CENTRUM SILVER PO) Take 1 tablet by mouth daily.      . Omega-3 Fatty Acids (FISH OIL) 1000 MG CAPS Take 1 capsule by mouth daily.    Marland Kitchen omeprazole (PRILOSEC) 40 MG capsule TAKE 1 CAPSULE BY MOUTH EVERY DAY 90 capsule 0  . rosuvastatin (CRESTOR) 5 MG tablet TAKE 1 TABLET DAILY 90 tablet 1  . SYNTHROID 50 MCG tablet TAKE 1 TABLET DAILY 90 tablet 1  . Tamsulosin HCl (FLOMAX) 0.4 MG CAPS Take 0.4 mg by mouth daily.      . Testosterone 30 MG/ACT SOLN Place 2 Pump onto the skin daily.    . traMADol (ULTRAM) 50 MG tablet Take 1 tablet (50 mg total) by mouth every 6 (six) hours as needed. 15 tablet 0   No current facility-administered medications on file prior to visit.     Allergies  Allergen Reactions  . Lipitor [Atorvastatin] Other (See Comments)    Myalgias, cramps in hand    Family History  Problem Relation Age of Onset  . Cancer Brother        uncertain type  . Diabetes Mellitus II Mother        Deceased, 63s  . Heart attack Father   . Healthy Daughter      Review of Systems     Objective:   Physical Exam    Lab Results  Component Value Date   CREATININE 1.12 01/13/2019   BUN 16 01/13/2019   NA 136 01/13/2019   K 3.2 (L) 01/13/2019   CL 102 01/13/2019   CO2 24 01/13/2019       Assessment & Plan:  Type 2 DM, with renal insuff: apparently well-controlled.  he declines A1c now.    Patient Instructions  Please continue the  same medications  Please come back for a follow-up appointment in 2-3 months.

## 2019-03-11 NOTE — Patient Instructions (Addendum)
Please continue the same medications  Please come back for a follow-up appointment in 2-3 months.

## 2019-03-16 ENCOUNTER — Encounter: Payer: Medicare Other | Admitting: *Deleted

## 2019-03-16 ENCOUNTER — Other Ambulatory Visit: Payer: Self-pay

## 2019-03-16 LAB — CUP PACEART REMOTE DEVICE CHECK
Date Time Interrogation Session: 20200509123802
Implantable Pulse Generator Implant Date: 20170512

## 2019-04-08 ENCOUNTER — Other Ambulatory Visit: Payer: Self-pay

## 2019-04-08 MED ORDER — FINASTERIDE 5 MG PO TABS: 5.0000 mg | ORAL_TABLET | Freq: Every day | ORAL | 1 refills | Status: DC

## 2019-04-16 ENCOUNTER — Ambulatory Visit (INDEPENDENT_AMBULATORY_CARE_PROVIDER_SITE_OTHER): Payer: Medicare Other | Admitting: *Deleted

## 2019-04-16 DIAGNOSIS — R55 Syncope and collapse: Secondary | ICD-10-CM

## 2019-04-16 LAB — CUP PACEART REMOTE DEVICE CHECK
Date Time Interrogation Session: 20200611125655
Implantable Pulse Generator Implant Date: 20170512

## 2019-04-21 NOTE — Progress Notes (Signed)
[facility] Summary Report / Loop Recorder 

## 2019-05-11 ENCOUNTER — Other Ambulatory Visit: Payer: Self-pay | Admitting: Internal Medicine

## 2019-05-14 ENCOUNTER — Other Ambulatory Visit: Payer: Self-pay

## 2019-05-18 ENCOUNTER — Ambulatory Visit (INDEPENDENT_AMBULATORY_CARE_PROVIDER_SITE_OTHER): Payer: Medicare Other | Admitting: Endocrinology

## 2019-05-18 ENCOUNTER — Other Ambulatory Visit: Payer: Self-pay

## 2019-05-18 ENCOUNTER — Encounter: Payer: Self-pay | Admitting: Endocrinology

## 2019-05-18 VITALS — BP 128/74 | HR 64 | Ht 75.0 in | Wt 208.8 lb

## 2019-05-18 DIAGNOSIS — E119 Type 2 diabetes mellitus without complications: Secondary | ICD-10-CM

## 2019-05-18 LAB — POCT GLYCOSYLATED HEMOGLOBIN (HGB A1C): Hemoglobin A1C: 6.7 % — AB (ref 4.0–5.6)

## 2019-05-18 MED ORDER — METFORMIN HCL 500 MG PO TABS: 500.0000 mg | ORAL_TABLET | Freq: Two times a day (BID) | ORAL | 3 refills | Status: DC

## 2019-05-18 NOTE — Patient Instructions (Addendum)
I have sent a prescription to your pharmacy, for the regular metformin. Please come back for a follow-up appointment in 3-4 months

## 2019-05-18 NOTE — Progress Notes (Signed)
Subjective:    Patient ID: Joseph Rocha, male    DOB: 27-Aug-1935, 84 y.o.   MRN: [id number]  HPI Pt returns for f/u of diabetes mellitus: DM type: 2 Dx'ed: 1884 Complications: polyneuropathy and renal insuff.  Therapy: metformin DKA: never Severe hypoglycemia: never Pancreatitis: never Pancreatic imaging: normal on 2019 CT Other: he took insulin for a brief time, soon after dx.   Interval history: pt states he feels well in general.  he takes metformin-XR 500 mg BID, which is recalled.  He says cbg's are well-controlled.   Past Medical History:  Diagnosis Date  . Allergy   . Anemia   . Diabetes mellitus   . Elevated PSA   . Hyperlipidemia   . Hypertension   . Hypogonadism male   . OA (osteoarthritis)   . 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 DOPPLER ECHOCARDIOGRAPHY  12/04/2001  . ELECTROCARDIOGRAM  03/25/2006  . EP IMPLANTABLE DEVICE N/A 03/16/2016   Procedure: Loop Recorder Insertion;  Surgeon: Deboraha Sprang, MD;  Location: [facility];  Service: Cardiovascular;  Laterality: N/A;  . KNEE ARTHROSCOPY Left   . LUMBAR DISC SURGERY    . MENISCUS REPAIR Right may 2015  . TONSILLECTOMY    . TRANSPHENOIDAL / TRANSNASAL HYPOPHYSECTOMY / RESECTION PITUITARY TUMOR  2005    Social History   Socioeconomic History  . Marital status: Married    Spouse name: Not on file  . Number of children: 2  . Years of education: Not on file  . Highest education level: Not on file  Occupational History  . Occupation: retired    Fish farm manager: RETIRED  Social Needs  . Financial resource strain: Not on file  . Food insecurity    Worry: Not on file    Inability: Not on file  . Transportation needs    Medical: Not on file    Non-medical: Not on file  Tobacco Use  . Smoking status: Former Smoker    Quit date: 02/22/1957    Years since quitting: 62.2  . Smokeless tobacco: Never Used  Substance and Sexual Activity  . Alcohol  use: No  . Drug use: No  . Sexual activity: Not Currently  Lifestyle  . Physical activity    Days per week: Not on file    Minutes per session: Not on file  . Stress: Not on file  Relationships  . Social Herbalist on phone: Not on file    Gets together: Not on file    Attends religious service: Not on file    Active member of club or organization: Not on file    Attends meetings of clubs or organizations: Not on file    Relationship status: Not on file  . Intimate partner violence    Fear of current or ex partner: Not on file    Emotionally abused: Not on file    Physically abused: Not on file    Forced sexual activity: Not on file  Other Topics Concern  . Not on file  Social History Narrative   Lives with wife.  They have two grown children.   He is retired from the [employer].    Current Outpatient Medications on File Prior to Visit  Medication Sig Dispense Refill  . calcium carbonate (OS-CAL) 600 MG TABS Take 600 mg by mouth daily.    . cetirizine-pseudoephedrine (ZYRTEC-D) 5-120 MG tablet Take 1 tablet by mouth  daily. 90 tablet 2  . EPINEPHrine 0.3 mg/0.3 mL IJ SOAJ injection Inject 0.3 mg into the muscle once as needed (allergic reaction).   0  . finasteride (PROSCAR) 5 MG tablet Take 1 tablet (5 mg total) by mouth daily. 90 tablet 1  . fludrocortisone (FLORINEF) 0.1 MG tablet Take 1 tablet (0.1 mg total) by mouth daily. 90 tablet 2  . fluticasone (FLONASE) 50 MCG/ACT nasal spray Place 2 sprays into both nostrils daily. 16 g 6  . Fluticasone-Salmeterol (ADVAIR DISKUS) 100-50 MCG/DOSE AEPB Inhale 1 puff into the lungs 2 (two) times daily. 1 each 0  . folic acid (FOLVITE) 1 MG tablet TAKE 1 TABLET DAILY 90 tablet 2  . IRON PO Take by mouth.    Marland Kitchen KLOR-CON M20 20 MEQ tablet Take 20 mEq by mouth daily.     Marland Kitchen loperamide (IMODIUM A-D) 2 MG tablet Take 1 tablet (2 mg total) by mouth 2 (two) times daily as needed for diarrhea or loose stools. 30 tablet 1  .  methocarbamol (ROBAXIN) 500 MG tablet TAKE 1 TABLET AT BEDTIME ASNEEDED FOR MUSCLE SPASM 90 tablet 0  . Multiple Vitamins-Minerals (CENTRUM SILVER PO) Take 1 tablet by mouth daily.      . Omega-3 Fatty Acids (FISH OIL) 1000 MG CAPS Take 1 capsule by mouth daily.    Marland Kitchen omeprazole (PRILOSEC) 40 MG capsule TAKE 1 CAPSULE DAILY 90 capsule 0  . rosuvastatin (CRESTOR) 5 MG tablet TAKE 1 TABLET DAILY 90 tablet 1  . SYNTHROID 50 MCG tablet TAKE 1 TABLET DAILY 90 tablet 1  . Tamsulosin HCl (FLOMAX) 0.4 MG CAPS Take 0.4 mg by mouth daily.      . Testosterone 30 MG/ACT SOLN Place 2 Pump onto the skin daily.    . traMADol (ULTRAM) 50 MG tablet Take 1 tablet (50 mg total) by mouth every 6 (six) hours as needed. 15 tablet 0   No current facility-administered medications on file prior to visit.     Allergies  Allergen Reactions  . Lipitor [Atorvastatin] Other (See Comments)    Myalgias, cramps in hand    Family History  Problem Relation Age of Onset  . Cancer Brother        uncertain type  . Diabetes Mellitus II Mother        Deceased, 68s  . Heart attack Father   . Healthy Daughter     BP 128/74 (BP Location: Left Arm, Patient Position: Sitting, Cuff Size: Large)   Pulse 64   Ht 6\' 3"  (1.905 m)   Wt 208 lb 12.8 oz (94.7 kg)   SpO2 95%   BMI 26.10 kg/m   Review of Systems No weight change    Objective:   Physical Exam VITAL SIGNS:  See vs page GENERAL: no distress Pulses: dorsalis pedis intact bilat.   MSK: no deformity of the feet CV: no leg edema.  Skin:  no ulcer on the feet.  normal color and temp on the feet.  Neuro: sensation is intact to touch on the feet.   Ext: there is bilateral onychomycosis of the toenails.     Lab Results  Component Value Date   HGBA1C 6.7 (A) 05/18/2019   Lab Results  Component Value Date   CREATININE 1.12 01/13/2019   BUN 16 01/13/2019   NA 136 01/13/2019   K 3.2 (L) 01/13/2019   CL 102 01/13/2019   CO2 24 01/13/2019       Assessment  & Plan:  Type 2  DM: well-controlled.  Drug recall, new.   Patient Instructions  I have sent a prescription to your pharmacy, for the regular metformin. Please come back for a follow-up appointment in 3-4 months

## 2019-05-19 ENCOUNTER — Ambulatory Visit (INDEPENDENT_AMBULATORY_CARE_PROVIDER_SITE_OTHER): Payer: Medicare Other | Admitting: *Deleted

## 2019-05-19 DIAGNOSIS — R55 Syncope and collapse: Secondary | ICD-10-CM | POA: Diagnosis not present

## 2019-05-19 LAB — CUP PACEART REMOTE DEVICE CHECK
Date Time Interrogation Session: 20200714120611
Implantable Pulse Generator Implant Date: 20170512

## 2019-06-02 NOTE — Progress Notes (Signed)
[facility] Summary Report / Loop Recorder 

## 2019-06-21 LAB — CUP PACEART REMOTE DEVICE CHECK
Date Time Interrogation Session: 20200816141113
Implantable Pulse Generator Implant Date: 20170512

## 2019-06-22 ENCOUNTER — Ambulatory Visit (INDEPENDENT_AMBULATORY_CARE_PROVIDER_SITE_OTHER): Payer: Medicare Other | Admitting: *Deleted

## 2019-06-22 DIAGNOSIS — R55 Syncope and collapse: Secondary | ICD-10-CM

## 2019-06-23 DIAGNOSIS — H6123 Impacted cerumen, bilateral: Secondary | ICD-10-CM | POA: Diagnosis not present

## 2019-06-23 DIAGNOSIS — J342 Deviated nasal septum: Secondary | ICD-10-CM | POA: Diagnosis not present

## 2019-06-23 DIAGNOSIS — J310 Chronic rhinitis: Secondary | ICD-10-CM | POA: Diagnosis not present

## 2019-06-30 NOTE — Progress Notes (Signed)
[facility] Summary Report / Loop Recorder 

## 2019-07-06 DIAGNOSIS — E291 Testicular hypofunction: Secondary | ICD-10-CM | POA: Diagnosis not present

## 2019-07-06 DIAGNOSIS — R948 Abnormal results of function studies of other organs and systems: Secondary | ICD-10-CM | POA: Diagnosis not present

## 2019-07-14 DIAGNOSIS — R972 Elevated prostate specific antigen [PSA]: Secondary | ICD-10-CM | POA: Diagnosis not present

## 2019-07-14 DIAGNOSIS — E291 Testicular hypofunction: Secondary | ICD-10-CM | POA: Diagnosis not present

## 2019-07-24 ENCOUNTER — Ambulatory Visit (INDEPENDENT_AMBULATORY_CARE_PROVIDER_SITE_OTHER): Payer: Medicare Other | Admitting: *Deleted

## 2019-07-24 DIAGNOSIS — R55 Syncope and collapse: Secondary | ICD-10-CM | POA: Diagnosis not present

## 2019-07-24 LAB — CUP PACEART REMOTE DEVICE CHECK
Date Time Interrogation Session: 20200918141111
Implantable Pulse Generator Implant Date: 20170512

## 2019-07-27 NOTE — Progress Notes (Signed)
[facility] Summary Report / Loop Recorder 

## 2019-08-12 ENCOUNTER — Other Ambulatory Visit: Payer: Self-pay | Admitting: Endocrinology

## 2019-08-13 ENCOUNTER — Other Ambulatory Visit: Payer: Self-pay | Admitting: Internal Medicine

## 2019-08-13 NOTE — Telephone Encounter (Signed)
Per Dr. Ellison's request, I am forwarding you this refill request. Please review and refill if appropriate 

## 2019-08-13 NOTE — Telephone Encounter (Signed)
Please advise about Crestor

## 2019-08-13 NOTE — Telephone Encounter (Signed)
Done erx 

## 2019-08-13 NOTE — Telephone Encounter (Signed)
Please forward refill request to pt's primary care provider.   

## 2019-08-26 ENCOUNTER — Ambulatory Visit (INDEPENDENT_AMBULATORY_CARE_PROVIDER_SITE_OTHER): Payer: Medicare Other | Admitting: *Deleted

## 2019-08-26 DIAGNOSIS — I495 Sick sinus syndrome: Secondary | ICD-10-CM | POA: Diagnosis not present

## 2019-08-26 DIAGNOSIS — R55 Syncope and collapse: Secondary | ICD-10-CM

## 2019-08-27 LAB — CUP PACEART REMOTE DEVICE CHECK
Date Time Interrogation Session: 20201021141206
Implantable Pulse Generator Implant Date: 20170512

## 2019-08-29 ENCOUNTER — Encounter (INDEPENDENT_AMBULATORY_CARE_PROVIDER_SITE_OTHER): Payer: Self-pay

## 2019-08-29 ENCOUNTER — Other Ambulatory Visit: Payer: Self-pay

## 2019-08-29 DIAGNOSIS — Z20822 Contact with and (suspected) exposure to covid-19: Secondary | ICD-10-CM

## 2019-08-29 DIAGNOSIS — Z20828 Contact with and (suspected) exposure to other viral communicable diseases: Secondary | ICD-10-CM | POA: Diagnosis not present

## 2019-08-30 LAB — NOVEL CORONAVIRUS, NAA: SARS-CoV-2, NAA: NOT DETECTED

## 2019-09-08 ENCOUNTER — Encounter: Payer: Self-pay | Admitting: Endocrinology

## 2019-09-08 ENCOUNTER — Other Ambulatory Visit: Payer: Self-pay

## 2019-09-08 ENCOUNTER — Ambulatory Visit (INDEPENDENT_AMBULATORY_CARE_PROVIDER_SITE_OTHER): Payer: Medicare Other | Admitting: Endocrinology

## 2019-09-08 VITALS — BP 112/70 | HR 88 | Ht 75.0 in | Wt 211.6 lb

## 2019-09-08 DIAGNOSIS — E119 Type 2 diabetes mellitus without complications: Secondary | ICD-10-CM

## 2019-09-08 LAB — POCT GLYCOSYLATED HEMOGLOBIN (HGB A1C): Hemoglobin A1C: 7.5 % — AB (ref 4.0–5.6)

## 2019-09-08 MED ORDER — METFORMIN HCL 500 MG PO TABS: 500.0000 mg | ORAL_TABLET | Freq: Two times a day (BID) | ORAL | 3 refills | Status: DC

## 2019-09-08 NOTE — Progress Notes (Signed)
[facility] Summary Report / Loop Recorder 

## 2019-09-08 NOTE — Progress Notes (Signed)
Subjective:    Patient ID: Joseph Rocha, male    DOB: Mar 08, 1935, 84 y.o.   MRN: [id number]  HPI Pt returns for f/u of diabetes mellitus: DM type: 2 Dx'ed: [id number] Complications: polyneuropathy and renal insuff.  Therapy: metformin DKA: never Severe hypoglycemia: never Pancreatitis: never Pancreatic imaging: normal on 2019 CT Other: he took insulin for a brief time, soon after dx.  Interval history: pt states he feels well in general.  he takes metformin 1/d.  He says cbg's are well-controlled.   Past Medical History:  Diagnosis Date  . Allergy   . Anemia   . Diabetes mellitus   . Elevated PSA   . Hyperlipidemia   . Hypertension   . Hypogonadism male   . OA (osteoarthritis)   . Pituitary abnormality ([place]) 2004    Past Surgical History:  Procedure Laterality Date  . DOPPLER ECHOCARDIOGRAPHY  12/04/2001  . ELECTROCARDIOGRAM  03/25/2006  . EP IMPLANTABLE DEVICE N/A 03/16/2016   Procedure: Loop Recorder Insertion;  Surgeon: Deboraha Sprang, MD;  Location: [facility];  Service: Cardiovascular;  Laterality: N/A;  . KNEE ARTHROSCOPY Left   . LUMBAR DISC SURGERY    . MENISCUS REPAIR Right may 2015  . TONSILLECTOMY    . TRANSPHENOIDAL / TRANSNASAL HYPOPHYSECTOMY / RESECTION PITUITARY TUMOR  2005    Social History   Socioeconomic History  . Marital status: Married    Spouse name: Not on file  . Number of children: 2  . Years of education: Not on file  . Highest education level: Not on file  Occupational History  . Occupation: retired    Fish farm manager: RETIRED  Social Needs  . Financial resource strain: Not on file  . Food insecurity    Worry: Not on file    Inability: Not on file  . Transportation needs    Medical: Not on file    Non-medical: Not on file  Tobacco Use  . Smoking status: Former Smoker    Quit date: 02/22/1957    Years since quitting: 62.5  . Smokeless tobacco: Never Used  Substance and Sexual Activity  . Alcohol use: No  . Drug use: No  .  Sexual activity: Not Currently  Lifestyle  . Physical activity    Days per week: Not on file    Minutes per session: Not on file  . Stress: Not on file  Relationships  . Social Herbalist on phone: Not on file    Gets together: Not on file    Attends religious service: Not on file    Active member of club or organization: Not on file    Attends meetings of clubs or organizations: Not on file    Relationship status: Not on file  . Intimate partner violence    Fear of current or ex partner: Not on file    Emotionally abused: Not on file    Physically abused: Not on file    Forced sexual activity: Not on file  Other Topics Concern  . Not on file  Social History Narrative   Lives with wife.  They have two grown children.   He is retired from the [employer].    Current Outpatient Medications on File Prior to Visit  Medication Sig Dispense Refill  . calcium carbonate (OS-CAL) 600 MG TABS Take 600 mg by mouth daily.    . cetirizine-pseudoephedrine (ZYRTEC-D) 5-120 MG tablet Take 1 tablet by mouth daily. 90 tablet 2  .  EPINEPHrine 0.3 mg/0.3 mL IJ SOAJ injection Inject 0.3 mg into the muscle once as needed (allergic reaction).   0  . finasteride (PROSCAR) 5 MG tablet Take 1 tablet (5 mg total) by mouth daily. 90 tablet 1  . fludrocortisone (FLORINEF) 0.1 MG tablet Take 1 tablet (0.1 mg total) by mouth daily. 90 tablet 2  . fluticasone (FLONASE) 50 MCG/ACT nasal spray Place 2 sprays into both nostrils daily. 16 g 6  . Fluticasone-Salmeterol (ADVAIR DISKUS) 100-50 MCG/DOSE AEPB Inhale 1 puff into the lungs 2 (two) times daily. 1 each 0  . folic acid (FOLVITE) 1 MG tablet TAKE 1 TABLET DAILY 90 tablet 2  . IRON PO Take by mouth.    Marland Kitchen KLOR-CON M20 20 MEQ tablet Take 20 mEq by mouth daily.     Marland Kitchen loperamide (IMODIUM A-D) 2 MG tablet Take 1 tablet (2 mg total) by mouth 2 (two) times daily as needed for diarrhea or loose stools. 30 tablet 1  . methocarbamol (ROBAXIN) 500 MG  tablet TAKE 1 TABLET AT BEDTIME ASNEEDED FOR MUSCLE SPASM 90 tablet 0  . Multiple Vitamins-Minerals (CENTRUM SILVER PO) Take 1 tablet by mouth daily.      . Omega-3 Fatty Acids (FISH OIL) 1000 MG CAPS Take 1 capsule by mouth daily.    Marland Kitchen omeprazole (PRILOSEC) 40 MG capsule TAKE 1 CAPSULE DAILY 90 capsule 0  . rosuvastatin (CRESTOR) 5 MG tablet TAKE 1 TABLET DAILY 90 tablet 2  . SYNTHROID 50 MCG tablet TAKE 1 TABLET DAILY 90 tablet 1  . Tamsulosin HCl (FLOMAX) 0.4 MG CAPS Take 0.4 mg by mouth daily.      . Testosterone 30 MG/ACT SOLN Place 2 Pump onto the skin daily.    . traMADol (ULTRAM) 50 MG tablet Take 1 tablet (50 mg total) by mouth every 6 (six) hours as needed. 15 tablet 0   No current facility-administered medications on file prior to visit.     Allergies  Allergen Reactions  . Lipitor [Atorvastatin] Other (See Comments)    Myalgias, cramps in hand    Family History  Problem Relation Age of Onset  . Cancer Brother        uncertain type  . Diabetes Mellitus II Mother        Deceased, 37s  . Heart attack Father   . Healthy Daughter     BP 112/70 (BP Location: Right Arm, Patient Position: Sitting, Cuff Size: Large)   Pulse 88   Ht 6\' 3"  (1.905 m)   Wt 211 lb 9.6 oz (96 kg)   BMI 26.45 kg/m    Review of Systems Denies nausea.     Objective:   Physical Exam VITAL SIGNS:  See vs page GENERAL: no distress Pulses: dorsalis pedis intact bilat.   MSK: no deformity of the feet CV: no leg edema.   Skin:  no ulcer on the feet.  normal color and temp on the feet.   Neuro: sensation is intact to touch on the feet.    Lab Results  Component Value Date   HGBA1C 7.5 (A) 09/08/2019   Lab Results  Component Value Date   CREATININE 1.12 01/13/2019   BUN 16 01/13/2019   NA 136 01/13/2019   K 3.2 (L) 01/13/2019   CL 102 01/13/2019   CO2 24 01/13/2019       Assessment & Plan:  Type 2 DM: he needs increased rx.    Patient Instructions  I have sent a prescription to  your  pharmacy, to increase the metformin to 2 per day.  Please come back for a follow-up appointment in 3-4 months.

## 2019-09-08 NOTE — Patient Instructions (Addendum)
I have sent a prescription to your pharmacy, to increase the metformin to 2 per day.  Please come back for a follow-up appointment in 3-4 months.

## 2019-09-10 ENCOUNTER — Other Ambulatory Visit: Payer: Self-pay | Admitting: Internal Medicine

## 2019-09-18 ENCOUNTER — Other Ambulatory Visit: Payer: Self-pay | Admitting: Internal Medicine

## 2019-09-18 ENCOUNTER — Other Ambulatory Visit: Payer: Self-pay | Admitting: Endocrinology

## 2019-09-18 NOTE — Telephone Encounter (Signed)
Please advise 

## 2019-09-22 ENCOUNTER — Telehealth: Payer: Self-pay | Admitting: Emergency Medicine

## 2019-09-22 NOTE — Telephone Encounter (Signed)
LMOM Alert for AF episode recorded by LINQ on 09/19/19 that was 2 minutes and 13 seconds. Appears to be AF with RVR with max v-rate of 194 bpm.Will assess for symptoms r/t event  NO OAC. Implanted for syncope.

## 2019-09-28 ENCOUNTER — Ambulatory Visit (INDEPENDENT_AMBULATORY_CARE_PROVIDER_SITE_OTHER): Payer: Medicare Other | Admitting: *Deleted

## 2019-09-28 DIAGNOSIS — R55 Syncope and collapse: Secondary | ICD-10-CM | POA: Diagnosis not present

## 2019-09-28 DIAGNOSIS — R42 Dizziness and giddiness: Secondary | ICD-10-CM

## 2019-09-29 LAB — CUP PACEART REMOTE DEVICE CHECK
Date Time Interrogation Session: 20201124070301
Implantable Pulse Generator Implant Date: 20170512

## 2019-09-30 NOTE — Telephone Encounter (Signed)
No issues reported by patient.

## 2019-09-30 NOTE — Telephone Encounter (Signed)
Agree to check for symptoms Otherwise SCAF and no anticoagulation

## 2019-10-25 NOTE — Addendum Note (Signed)
Addended by: Patsey Berthold on: 10/25/2019 06:59 PM   Modules accepted: Level of Service

## 2019-10-29 ENCOUNTER — Ambulatory Visit (INDEPENDENT_AMBULATORY_CARE_PROVIDER_SITE_OTHER): Payer: Medicare Other | Admitting: *Deleted

## 2019-10-29 DIAGNOSIS — R55 Syncope and collapse: Secondary | ICD-10-CM

## 2019-10-29 LAB — CUP PACEART REMOTE DEVICE CHECK
Date Time Interrogation Session: 20201223230858
Implantable Pulse Generator Implant Date: 20170512

## 2019-10-31 NOTE — Progress Notes (Signed)
ILR remote 

## 2019-11-04 ENCOUNTER — Telehealth: Payer: Self-pay | Admitting: Student

## 2019-11-04 NOTE — Telephone Encounter (Signed)
LINQ (implant 03/2016) at [facility].   LMOM to call back to discuss.   Implant diagnosis syncope, doesn't appear to have had syncope since implant.  Of note, recently had episode of subclinical AF, so Dr. Caryl Comes MAY want continued monitoring.  Forwarded to him for [facility] as well.   Legrand Como [address] [place], PA-C  11/04/2019 8:49 AM

## 2019-11-12 ENCOUNTER — Other Ambulatory Visit: Payer: Self-pay | Admitting: Endocrinology

## 2019-11-12 ENCOUNTER — Other Ambulatory Visit: Payer: Self-pay | Admitting: Internal Medicine

## 2019-11-12 NOTE — Telephone Encounter (Signed)
Please refill as per office routine med refill policy (all routine meds refilled for 3 mo or monthly per pt preference up to one year from last visit, then month to month grace period for 3 mo, then further med refills will have to be denied)  

## 2019-11-13 NOTE — Telephone Encounter (Signed)
Second attempt:  LMOM requesting call back to DC. Direct number and office hours provided.  

## 2019-11-17 DIAGNOSIS — J301 Allergic rhinitis due to pollen: Secondary | ICD-10-CM | POA: Diagnosis not present

## 2019-11-17 DIAGNOSIS — J3089 Other allergic rhinitis: Secondary | ICD-10-CM | POA: Diagnosis not present

## 2019-11-18 NOTE — Telephone Encounter (Signed)
Patient is scheduled to see Dr. Caryl Comes on 12/09/19 at 3:00pm to discuss ILR explant.

## 2019-11-30 ENCOUNTER — Ambulatory Visit (INDEPENDENT_AMBULATORY_CARE_PROVIDER_SITE_OTHER): Payer: Medicare Other | Admitting: *Deleted

## 2019-11-30 DIAGNOSIS — R55 Syncope and collapse: Secondary | ICD-10-CM

## 2019-11-30 LAB — CUP PACEART REMOTE DEVICE CHECK
Date Time Interrogation Session: 20210124230917
Implantable Pulse Generator Implant Date: 20170512

## 2019-12-08 DIAGNOSIS — Z95818 Presence of other cardiac implants and grafts: Secondary | ICD-10-CM | POA: Insufficient documentation

## 2019-12-08 DIAGNOSIS — R001 Bradycardia, unspecified: Secondary | ICD-10-CM | POA: Insufficient documentation

## 2019-12-09 ENCOUNTER — Other Ambulatory Visit: Payer: Self-pay

## 2019-12-09 ENCOUNTER — Encounter: Payer: Self-pay | Admitting: Internal Medicine

## 2019-12-09 ENCOUNTER — Ambulatory Visit (INDEPENDENT_AMBULATORY_CARE_PROVIDER_SITE_OTHER): Payer: Medicare Other | Admitting: Internal Medicine

## 2019-12-09 VITALS — BP 86/58 | HR 75 | Ht 75.0 in | Wt 215.8 lb

## 2019-12-09 DIAGNOSIS — Z95818 Presence of other cardiac implants and grafts: Secondary | ICD-10-CM | POA: Diagnosis not present

## 2019-12-09 DIAGNOSIS — R55 Syncope and collapse: Secondary | ICD-10-CM

## 2019-12-09 DIAGNOSIS — Z4589 Encounter for adjustment and management of other implanted devices: Secondary | ICD-10-CM | POA: Diagnosis not present

## 2019-12-09 DIAGNOSIS — R001 Bradycardia, unspecified: Secondary | ICD-10-CM

## 2019-12-09 NOTE — Patient Instructions (Addendum)
Medication Instructions:  Your physician recommends that you continue on your current medications as directed. Please refer to the Current Medication list given to you today.  *If you need a refill on your cardiac medications before your next appointment, please call your pharmacy*  Lab Work: None ordered.  If you have labs (blood work) drawn today and your tests are completely normal, you will receive your results only by: Marland Kitchen MyChart Message (if you have MyChart) OR . A paper copy in the mail If you have any lab test that is abnormal or we need to change your treatment, we will call you to review the results.  Testing/Procedures: None ordered.   Follow-Up: At [facility], you and your health needs are our priority.  As part of our continuing mission to provide you with exceptional heart care, we have created designated Provider Care Teams.  These Care Teams include your primary Cardiologist (physician) and Advanced Practice Providers (APPs -  Physician Assistants and Nurse Practitioners) who all work together to provide you with the care you need, when you need it.  Your next appointment:  Virtual appointment for wound check. 12/22/2019 at 830am    Incision Care, Adult An incision is a cut that a doctor makes in your skin for surgery (for a procedure). Most times, these cuts are closed after surgery. Your cut from surgery may be closed skin tape (adhesive strips). This may happen many days or many weeks after your surgery. The cut needs to be well cared for so it does not get infected. How to care for your cut  Cut care   Follow instructions from your doctor about how to take care of your cut. Make sure you: ? Wash your hands with soap and water before you change your bandage (dressing). If you cannot use soap and water, use hand sanitizer. ? Change your bandage as told by your doctor. ? Leave  skin tape in place. They may need to stay in place for 2 weeks or longer. If tape  strips get loose and curl up, you may trim the loose edges. Do not remove tape strips completely unless your doctor says it is okay.  Check your cut area every day for signs of infection. Check for: ? More redness, swelling, or pain. ? More fluid or blood. ? Warmth. ? Pus or a bad smell.  Ask your doctor how to clean the cut. This may include: ? Using mild soap and water. ? Using a clean towel to pat the cut dry after you clean it. ? Covering the cut with a clean bandage.   Ask your doctor when you can leave the cut uncovered.   Do not take baths, swim, or use a hot tub for 2 weeks  You may shower tomorrow evening 12/10/2019 with your big bandage on.  Remove  The large bandage on Monday (12/14/2019) leave steri strips on until they fall off.  Medicines   Take over-the-counter and prescription medicines only as told by your doctor. General instructions   Limit movement around your cut. This helps healing. ? Avoid straining, lifting, or exercise for the first month, or for as long as told by your doctor. ? Follow instructions from your doctor about going back to your normal activities. ? Ask your doctor what activities are safe.    Keep all follow-up visits as told by your doctor. This is important.   Contact a doctor if:  Your have more redness, swelling, or pain around the cut.  You have more fluid or blood coming from the cut.  Your cut feels warm to the touch.  You have pus or a bad smell coming from the cut.  You have a fever or shaking chills.  You feel sick to your stomach (nauseous) or you throw up (vomit).  You are dizzy.  Your stitches or staples come undone.  Get help right away if:   You have a red streak coming from your cut.  Your cut bleeds through the bandage and the bleeding does not stop with gentle pressure.  The edges of your cut open up and separate.  You have very bad (severe) pain.  You have a rash.  You are confused.  You  pass out (faint).  You have trouble breathing and you have a fast heartbeat. This information is not intended to replace advice given to you by your health care provider. Make sure you discuss any questions you have with your health care provider.  Document Revised: 03/11/2017 Document Reviewed: 06/29/2016 Elsevier Patient Education  2020 [employer].

## 2019-12-09 NOTE — Progress Notes (Signed)
Patient Care Team: Biagio Borg, MD as PCP - General (Internal Medicine) Carolan Clines, MD (Inactive) as Attending Physician (Urology) Belva Crome, MD as Attending Physician (Cardiology) Erline Levine, MD as Attending Physician (Neurosurgery) Harold Hedge, Darrick Grinder, MD (Allergy and Immunology)   HPI  Joseph Rocha is a 85 y.o. male Seen in follow-up for recurrent syncope.  Previously implanted loop recorder.  He is now here to have it removed.  His episodes are stereotypical.   No interval syncope   Desires loop recorder explant  The patient denies chest pain, shortness of breath, nocturnal dyspnea, orthopnea or peripheral edema.  There have been no palpitations,  or syncope.  Walks with a stick --some lightheadedness   Cardiac evaluation demonstrated a normal Myoview normal echo and normal ECG.          Past Medical History:  Diagnosis Date  . Allergy   . Anemia   . Diabetes mellitus   . Elevated PSA   . Hyperlipidemia   . Hypertension   . Hypogonadism male   . OA (osteoarthritis)   . 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 DOPPLER ECHOCARDIOGRAPHY  12/04/2001  . ELECTROCARDIOGRAM  03/25/2006  . EP IMPLANTABLE DEVICE N/A 03/16/2016   Procedure: Loop Recorder Insertion;  Surgeon: Deboraha Sprang, MD;  Location: [place] [facility];  Service: Cardiovascular;  Laterality: N/A;  . KNEE ARTHROSCOPY Left   . LUMBAR DISC SURGERY    . MENISCUS REPAIR Right may 2015  . TONSILLECTOMY    . TRANSPHENOIDAL / TRANSNASAL HYPOPHYSECTOMY / RESECTION PITUITARY TUMOR  2005    Current Outpatient Medications  Medication Sig Dispense Refill  . calcium carbonate (OS-CAL) 600 MG TABS Take 600 mg by mouth daily.    . cetirizine-pseudoephedrine (ZYRTEC-D) 5-120 MG tablet Take 1 tablet by mouth daily. 90 tablet 2  . EPINEPHrine 0.3 mg/0.3 mL IJ SOAJ injection Inject 0.3 mg into the muscle once as needed (allergic  reaction).   0  . finasteride (PROSCAR) 5 MG tablet TAKE 1 TABLET DAILY 90 tablet 0  . fludrocortisone (FLORINEF) 0.1 MG tablet Take 1 tablet (0.1 mg total) by mouth daily. 90 tablet 2  . fluticasone (FLONASE) 50 MCG/ACT nasal spray Place 2 sprays into both nostrils daily. 16 g 6  . folic acid (FOLVITE) 1 MG tablet TAKE 1 TABLET DAILY 90 tablet 2  . IRON PO Take by mouth.    Marland Kitchen KLOR-CON M20 20 MEQ tablet Take 20 mEq by mouth daily.     Marland Kitchen loperamide (IMODIUM A-D) 2 MG tablet Take 1 tablet (2 mg total) by mouth 2 (two) times daily as needed for diarrhea or loose stools. 30 tablet 1  . metFORMIN (GLUCOPHAGE) 500 MG tablet Take 1 tablet (500 mg total) by mouth 2 (two) times daily with a meal. 180 tablet 3  . methocarbamol (ROBAXIN) 500 MG tablet TAKE 1 TABLET AT BEDTIME ASNEEDED FOR MUSCLE SPASM 90 tablet 0  . Multiple Vitamins-Minerals (CENTRUM SILVER PO) Take 1 tablet by mouth daily.      . Omega-3 Fatty Acids (FISH OIL) 1000 MG CAPS Take 1 capsule by mouth daily.    Marland Kitchen omeprazole (PRILOSEC) 40 MG capsule TAKE 1 CAPSULE DAILY 90 capsule 1  . rosuvastatin (CRESTOR) 5 MG tablet TAKE 1 TABLET DAILY 90 tablet 2  . SYNTHROID 50 MCG tablet TAKE 1 TABLET DAILY 90 tablet 1  . Tamsulosin HCl (FLOMAX) 0.4  MG CAPS Take 0.4 mg by mouth daily.      . Testosterone 30 MG/ACT SOLN Place 2 Pump onto the skin daily.    . traMADol (ULTRAM) 50 MG tablet Take 1 tablet (50 mg total) by mouth every 6 (six) hours as needed. 15 tablet 0   No current facility-administered medications for this visit.    Allergies  Allergen Reactions  . Lipitor [Atorvastatin] Other (See Comments)    Myalgias, cramps in hand      Review of Systems negative except from HPI and PMH  Physical Exam BP (!) 86/58   Pulse 75   Ht 6\' 3"  (1.905 m)   Wt 215 lb 12.8 oz (97.9 kg)   SpO2 96%   BMI 26.97 kg/m  Well developed and nourished in no acute distress HENT normal Neck supple with JVP-flat Clear Regular rate and rhythm, no  murmurs or gallops Abd-soft with active BS No Clubbing cyanosis edema Skin-warm and dry A & Oriented  Grossly normal sensory and motor function   ECG sinus @ 65 15/08/43  Assessment and  Plan Syncope  Implantable loop recorder  Sinus bradycardia  RENNARD DERUITER [id number]  [id number]  Preop Dx: implantable loop recorder Postop Dx same  Procedure:loop recorder explant  Local anesthesia with Lidocaine, an incision made and the device removed. Benzoin-steristrip dressing applied Instructions given   Cx: None        Virl Axe, MD 12/09/2019 5:30 PM    Current medicines are reviewed at length with the patient today .  The patient does not  have concerns regarding medicines.

## 2019-12-22 ENCOUNTER — Other Ambulatory Visit: Payer: Self-pay

## 2019-12-22 ENCOUNTER — Telehealth: Payer: Medicare Other

## 2020-01-04 ENCOUNTER — Other Ambulatory Visit: Payer: Self-pay | Admitting: Endocrinology

## 2020-01-04 LAB — CUP PACEART INCLINIC DEVICE CHECK
Date Time Interrogation Session: 20210203082727
Implantable Pulse Generator Implant Date: 20170512

## 2020-01-12 ENCOUNTER — Other Ambulatory Visit: Payer: Self-pay

## 2020-01-12 MED ORDER — LEVOTHYROXINE SODIUM 50 MCG PO TABS: 50.0000 ug | ORAL_TABLET | Freq: Every day | ORAL | 0 refills | Status: DC

## 2020-01-16 ENCOUNTER — Other Ambulatory Visit: Payer: Self-pay | Admitting: Endocrinology

## 2020-01-25 DIAGNOSIS — L0291 Cutaneous abscess, unspecified: Secondary | ICD-10-CM | POA: Diagnosis not present

## 2020-01-28 ENCOUNTER — Telehealth: Payer: Self-pay | Admitting: Endocrinology

## 2020-01-28 ENCOUNTER — Other Ambulatory Visit: Payer: Self-pay

## 2020-01-28 MED ORDER — LEVOTHYROXINE SODIUM 50 MCG PO TABS: 50.0000 ug | ORAL_TABLET | Freq: Every day | ORAL | 0 refills | Status: DC

## 2020-01-28 NOTE — Telephone Encounter (Signed)
A 30 day supply was already sent 01/12/20. Requesting mail in refill. Do you want to wait to refill until labs obtained?

## 2020-01-28 NOTE — Telephone Encounter (Signed)
Outpatient Medication Detail   Disp Refills Start End   levothyroxine (SYNTHROID) 50 MCG tablet 30 tablet 0 01/28/2020    Sig - Route: Take 1 tablet (50 mcg total) by mouth daily. OVERDUE FOR AN APPT. WILL ONLY PROVIDE 30 DAY SUPPLY. MUST CALL OFFICE TO SCHEDULE APPT - Oral   Sent to pharmacy as: levothyroxine (SYNTHROID) 50 MCG tablet   E-Prescribing Status: Receipt confirmed by pharmacy (01/28/2020  4:32 PM EDT)

## 2020-01-28 NOTE — Telephone Encounter (Signed)
Please refill prn 

## 2020-01-28 NOTE — Telephone Encounter (Signed)
MEDICATION: levothyroxine (SYNTHROID) 50 MCG tablet  PHARMACY:   [facility] [place] [place] [facility] Phone:  [phone]  Fax:  [phone]      IS THIS A 90 DAY SUPPLY : Yes  IS PATIENT OUT OF MEDICATION: No  IF NOT; HOW MUCH IS LEFT: Not much  LAST APPOINTMENT DATE: @11 /01/2019  NEXT APPOINTMENT DATE:@4 /04/2020  DO WE HAVE YOUR PERMISSION TO LEAVE A DETAILED MESSAGE: Yes  OTHER COMMENTS:    **Let patient know to contact pharmacy at the end of the day to make sure medication is ready. **  ** Please notify patient to allow 48-72 hours to process**  **Encourage patient to contact the pharmacy for refills or they can request refills through [facility]**

## 2020-02-04 ENCOUNTER — Other Ambulatory Visit: Payer: Self-pay

## 2020-02-04 NOTE — Telephone Encounter (Signed)
I called patient for screening and patient stated that from now on he requests that his RX's for Levothyroxine be sent for 90 day supply.

## 2020-02-09 ENCOUNTER — Ambulatory Visit (INDEPENDENT_AMBULATORY_CARE_PROVIDER_SITE_OTHER): Payer: Medicare Other | Admitting: Endocrinology

## 2020-02-09 ENCOUNTER — Other Ambulatory Visit: Payer: Self-pay

## 2020-02-09 ENCOUNTER — Encounter: Payer: Self-pay | Admitting: Endocrinology

## 2020-02-09 VITALS — BP 92/54 | HR 70 | Ht 75.0 in | Wt 211.0 lb

## 2020-02-09 DIAGNOSIS — E119 Type 2 diabetes mellitus without complications: Secondary | ICD-10-CM

## 2020-02-09 DIAGNOSIS — M26622 Arthralgia of left temporomandibular joint: Secondary | ICD-10-CM | POA: Diagnosis not present

## 2020-02-09 DIAGNOSIS — M26629 Arthralgia of temporomandibular joint, unspecified side: Secondary | ICD-10-CM | POA: Insufficient documentation

## 2020-02-09 LAB — T4, FREE: Free T4: 0.76 ng/dL (ref 0.60–1.60)

## 2020-02-09 LAB — POCT GLYCOSYLATED HEMOGLOBIN (HGB A1C): Hemoglobin A1C: 9.5 % — AB (ref 4.0–5.6)

## 2020-02-09 LAB — BASIC METABOLIC PANEL
BUN: 23 mg/dL (ref 6–23)
CO2: 27 mEq/L (ref 19–32)
Calcium: 9 mg/dL (ref 8.4–10.5)
Chloride: 98 mEq/L (ref 96–112)
Creatinine, Ser: 1.27 mg/dL (ref 0.40–1.50)
GFR: 65.53 mL/min (ref >60.00)
Glucose, Bld: 232 mg/dL — ABNORMAL HIGH (ref 70–99)
Potassium: 4.4 mEq/L (ref 3.5–5.1)
Sodium: 131 mEq/L — ABNORMAL LOW (ref 135–145)

## 2020-02-09 LAB — TSH: TSH: 0.83 u[IU]/mL (ref 0.35–4.50)

## 2020-02-09 MED ORDER — LEVOTHYROXINE SODIUM 50 MCG PO TABS: 50.0000 ug | ORAL_TABLET | Freq: Every day | ORAL | 3 refills | Status: DC

## 2020-02-09 MED ORDER — RYBELSUS 3 MG PO TABS: 3.0000 mg | ORAL_TABLET | Freq: Every day | ORAL | 3 refills | Status: DC

## 2020-02-09 NOTE — Progress Notes (Signed)
Subjective:    Patient ID: Joseph Rocha, male    DOB: 08-13-1935, 85 y.o.   MRN: [id number]  HPI Pt returns for f/u of diabetes mellitus:  DM type: 2 Dx'ed: [id number] Complications: polyneuropathy and renal insuff.   Therapy: metformin.   DKA: never Severe hypoglycemia: never.   Pancreatitis: never.   Pancreatic imaging: normal on 2019 CT.   Other: he took insulin for a brief time, soon after dx.  Interval history: pt says he has not recently checked cbg.  He takes metformin as rx'ed.   Past Medical History:  Diagnosis Date  . Allergy   . Anemia   . Diabetes mellitus   . Elevated PSA   . Hyperlipidemia   . Hypertension   . Hypogonadism male   . OA (osteoarthritis)   . 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 DOPPLER ECHOCARDIOGRAPHY  12/04/2001  . ELECTROCARDIOGRAM  03/25/2006  . EP IMPLANTABLE DEVICE N/A 03/16/2016   Procedure: Loop Recorder Insertion;  Surgeon: Deboraha Sprang, MD;  Location: [place] [facility];  Service: Cardiovascular;  Laterality: N/A;  . KNEE ARTHROSCOPY Left   . LUMBAR DISC SURGERY    . MENISCUS REPAIR Right may 2015  . TONSILLECTOMY    . TRANSPHENOIDAL / TRANSNASAL HYPOPHYSECTOMY / RESECTION PITUITARY TUMOR  2005    Social History   Socioeconomic History  . Marital status: Married    Spouse name: Not on file  . Number of children: 2  . Years of education: Not on file  . Highest education level: Not on file  Occupational History  . Occupation: retired    Fish farm manager: RETIRED  Tobacco Use  . Smoking status: Former Smoker    Quit date: 02/22/1957    Years since quitting: 63.0  . Smokeless tobacco: Never Used  Substance and Sexual Activity  . Alcohol use: No  . Drug use: No  . Sexual activity: Not Currently  Other Topics Concern  . Not on file  Social History Narrative   Lives with wife.  They have two grown children.   He is retired from the [employer].   Social Determinants of Health    Financial Resource Strain:   . Difficulty of Paying Living Expenses:   Food Insecurity:   . Worried About Charity fundraiser in the Last Year:   . Arboriculturist in the Last Year:   Transportation Needs:   . Film/video editor (Medical):   Marland Kitchen Lack of Transportation (Non-Medical):   Physical Activity:   . Days of Exercise per Week:   . Minutes of Exercise per Session:   Stress:   . Feeling of Stress :   Social Connections:   . Frequency of Communication with Friends and Family:   . Frequency of Social Gatherings with Friends and Family:   . Attends Religious Services:   . Active Member of Clubs or Organizations:   . Attends Archivist Meetings:   Marland Kitchen Marital Status:   Intimate Partner Violence:   . Fear of Current or Ex-Partner:   . Emotionally Abused:   Marland Kitchen Physically Abused:   . Sexually Abused:     Current Outpatient Medications on File Prior to Visit  Medication Sig Dispense Refill  . calcium carbonate (OS-CAL) 600 MG TABS Take 600 mg by mouth daily.    . cetirizine-pseudoephedrine (ZYRTEC-D) 5-120 MG tablet Take 1 tablet by mouth daily. 90 tablet 2  .  EPINEPHrine 0.3 mg/0.3 mL IJ SOAJ injection Inject 0.3 mg into the muscle once as needed (allergic reaction).   0  . finasteride (PROSCAR) 5 MG tablet TAKE 1 TABLET DAILY 90 tablet 0  . fludrocortisone (FLORINEF) 0.1 MG tablet Take 1 tablet (0.1 mg total) by mouth daily. 90 tablet 2  . fluticasone (FLONASE) 50 MCG/ACT nasal spray Place 2 sprays into both nostrils daily. 16 g 6  . folic acid (FOLVITE) 1 MG tablet TAKE 1 TABLET DAILY 90 tablet 2  . IRON PO Take by mouth.    Marland Kitchen KLOR-CON M20 20 MEQ tablet Take 20 mEq by mouth daily.     Marland Kitchen loperamide (IMODIUM A-D) 2 MG tablet Take 1 tablet (2 mg total) by mouth 2 (two) times daily as needed for diarrhea or loose stools. 30 tablet 1  . metFORMIN (GLUCOPHAGE) 500 MG tablet Take 1 tablet (500 mg total) by mouth 2 (two) times daily with a meal. 180 tablet 3  .  methocarbamol (ROBAXIN) 500 MG tablet TAKE 1 TABLET AT BEDTIME ASNEEDED FOR MUSCLE SPASM 90 tablet 0  . Multiple Vitamins-Minerals (CENTRUM SILVER PO) Take 1 tablet by mouth daily.      . Omega-3 Fatty Acids (FISH OIL) 1000 MG CAPS Take 1 capsule by mouth daily.    Marland Kitchen omeprazole (PRILOSEC) 40 MG capsule TAKE 1 CAPSULE DAILY 90 capsule 1  . rosuvastatin (CRESTOR) 5 MG tablet TAKE 1 TABLET DAILY 90 tablet 2  . Tamsulosin HCl (FLOMAX) 0.4 MG CAPS Take 0.4 mg by mouth daily.      . Testosterone 30 MG/ACT SOLN Place 2 Pump onto the skin daily.    . traMADol (ULTRAM) 50 MG tablet Take 1 tablet (50 mg total) by mouth every 6 (six) hours as needed. 15 tablet 0   No current facility-administered medications on file prior to visit.    Allergies  Allergen Reactions  . Lipitor [Atorvastatin] Other (See Comments)    Myalgias, cramps in hand    Family History  Problem Relation Age of Onset  . Cancer Brother        uncertain type  . Diabetes Mellitus II Mother        Deceased, 34s  . Heart attack Father   . Healthy Daughter     BP (!) 92/54   Pulse 70   Ht 6\' 3"  (1.905 m)   Wt 211 lb (95.7 kg)   SpO2 93%   BMI 26.37 kg/m    Review of Systems He reports pain the the left TMJ (no injury).      Objective:   Physical Exam VITAL SIGNS:  See vs page GENERAL: no distress Pulses: dorsalis pedis intact bilat.   MSK: no deformity of the feet CV: no leg edema Skin:  no ulcer on the feet.  normal color and temp on the feet. Neuro: sensation is intact to touch on the feet. Ext: there is bilateral onychomycosis of the toenails  Lab Results  Component Value Date   CREATININE 1.12 01/13/2019   BUN 16 01/13/2019   NA 136 01/13/2019   K 3.2 (L) 01/13/2019   CL 102 01/13/2019   CO2 24 01/13/2019   Lab Results  Component Value Date   HGBA1C 9.5 (A) 02/09/2020        Assessment & Plan:  type 2 DM: worse CRI: This limits rx options TMJ arthralgia, new, uncertain etiology.  Patient  Instructions  Blood tests are requested for you today.  We'll let you know about  the results.  Please see an ear-nose-throat specialist.  you will receive a phone call, about a day and time for an appointment. I have sent a prescription to your pharmacy, to add "Rybelsus."  check your blood sugar once a day.  vary the time of day when you check, between before the 3 meals, and at bedtime.  also check if you have symptoms of your blood sugar being too high or too low.  please keep a record of the readings and bring it to your next appointment here (or you can bring the meter itself).  You can write it on any piece of paper.  please call us sooner if your blood sugar goes below 70, or if you have a lot of readings over 200. Please come back for a follow-up appointment in 1 month.

## 2020-02-09 NOTE — Patient Instructions (Addendum)
Blood tests are requested for you today.  We'll let you know about the results.  Please see an ear-nose-throat specialist.  you will receive a phone call, about a day and time for an appointment. I have sent a prescription to your pharmacy, to add "Rybelsus."  check your blood sugar once a day.  vary the time of day when you check, between before the 3 meals, and at bedtime.  also check if you have symptoms of your blood sugar being too high or too low.  please keep a record of the readings and bring it to your next appointment here (or you can bring the meter itself).  You can write it on any piece of paper.  please call us sooner if your blood sugar goes below 70, or if you have a lot of readings over 200. Please come back for a follow-up appointment in 1 month.

## 2020-02-10 ENCOUNTER — Telehealth: Payer: Self-pay | Admitting: Endocrinology

## 2020-02-10 ENCOUNTER — Other Ambulatory Visit: Payer: Self-pay

## 2020-02-10 ENCOUNTER — Telehealth: Payer: Self-pay

## 2020-02-10 DIAGNOSIS — E119 Type 2 diabetes mellitus without complications: Secondary | ICD-10-CM

## 2020-02-10 MED ORDER — RYBELSUS 3 MG PO TABS: 3.0000 mg | ORAL_TABLET | Freq: Every day | ORAL | 0 refills | Status: DC

## 2020-02-10 NOTE — Telephone Encounter (Signed)
Outpatient Medication Detail   Disp Refills Start End   Semaglutide (RYBELSUS) 3 MG TABS 10 tablet 0 02/10/2020    Sig - Route: Take 3 mg by mouth daily. Pt is waiting for his mail order and requested a limited supply of 10 tablets - Oral   Sent to pharmacy as: Semaglutide (RYBELSUS) 3 MG Tab   E-Prescribing Status: Receipt confirmed by pharmacy (02/10/2020 12:00 PM EDT)

## 2020-02-10 NOTE — Telephone Encounter (Signed)
LAB RESULTS  Lab results were reviewed by Dr. Ellison. A letter has been mailed to pt home address. For future reference, letter can be found in Epic. 

## 2020-02-10 NOTE — Telephone Encounter (Signed)
-----   Message from Renato Shin, MD sent at 02/09/2020  6:06 PM EDT ----- please contact patient: Thyroid is normal.  Please continue the same medication for this.  I'll see you next time.

## 2020-02-10 NOTE — Telephone Encounter (Signed)
Patient called re: Patient was prescribed new medication: Semaglutide (RYBELSUS) 3 MG TABS that was [facility] to [facility] which will take at least 1 week for patient to receive. Patient states Dr. Loanne Drilling wants patient to start taking the medication listed above right away. Patient requests a RX for generic Semaglutide (RYBELSUS) 3 MG TABS to be sent to:  [facility] [place], [address] Phone:  [phone]  Fax:  [phone]     Patient requests that the above RX be sent for a 7-10 day supply to last patient until his RX for the above medication arrives in the mail from [place].

## 2020-02-18 ENCOUNTER — Other Ambulatory Visit: Payer: Self-pay

## 2020-02-18 DIAGNOSIS — E119 Type 2 diabetes mellitus without complications: Secondary | ICD-10-CM

## 2020-02-18 MED ORDER — LEVOTHYROXINE SODIUM 50 MCG PO TABS: 50.0000 ug | ORAL_TABLET | Freq: Every day | ORAL | 3 refills | Status: DC

## 2020-03-15 ENCOUNTER — Ambulatory Visit: Payer: Medicare Other | Admitting: Endocrinology

## 2020-03-18 ENCOUNTER — Other Ambulatory Visit: Payer: Self-pay

## 2020-03-22 ENCOUNTER — Ambulatory Visit (INDEPENDENT_AMBULATORY_CARE_PROVIDER_SITE_OTHER): Payer: Medicare Other | Admitting: Endocrinology

## 2020-03-22 ENCOUNTER — Other Ambulatory Visit: Payer: Self-pay

## 2020-03-22 ENCOUNTER — Encounter: Payer: Self-pay | Admitting: Endocrinology

## 2020-03-22 VITALS — BP 116/70 | HR 73 | Ht 75.0 in | Wt 208.0 lb

## 2020-03-22 DIAGNOSIS — E119 Type 2 diabetes mellitus without complications: Secondary | ICD-10-CM

## 2020-03-22 NOTE — Progress Notes (Signed)
Subjective:    Patient ID: Joseph Rocha, male    DOB: Aug 11, 1935, 85 y.o.   MRN: [id number]  HPI Pt returns for f/u of diabetes mellitus:  DM type: 2 Dx'ed: [id number] Complications: PN   Therapy: metformin.   DKA: never Severe hypoglycemia: never.   Pancreatitis: never.   Pancreatic imaging: normal on 2019 CT.   Other: he took insulin for a brief time, soon after dx.  SDOH: he could not afford Rybelsus.   Interval history: pt says cbg's are in the mid-100's.  He takes metformin as rx'ed.  pt states he feels well in general.  Past Medical History:  Diagnosis Date  . Allergy   . Anemia   . Diabetes mellitus   . Elevated PSA   . Hyperlipidemia   . Hypertension   . Hypogonadism male   . OA (osteoarthritis)   . Pituitary abnormality ([place]) 2004    Past Surgical History:  Procedure Laterality Date  . DOPPLER ECHOCARDIOGRAPHY  12/04/2001  . ELECTROCARDIOGRAM  03/25/2006  . EP IMPLANTABLE DEVICE N/A 03/16/2016   Procedure: Loop Recorder Insertion;  Surgeon: Deboraha Sprang, MD;  Location: [facility];  Service: Cardiovascular;  Laterality: N/A;  . KNEE ARTHROSCOPY Left   . LUMBAR DISC SURGERY    . MENISCUS REPAIR Right may 2015  . TONSILLECTOMY    . TRANSPHENOIDAL / TRANSNASAL HYPOPHYSECTOMY / RESECTION PITUITARY TUMOR  2005    Social History   Socioeconomic History  . Marital status: Married    Spouse name: Not on file  . Number of children: 2  . Years of education: Not on file  . Highest education level: Not on file  Occupational History  . Occupation: retired    Fish farm manager: RETIRED  Tobacco Use  . Smoking status: Former Smoker    Quit date: 02/22/1957    Years since quitting: 63.1  . Smokeless tobacco: Never Used  Substance and Sexual Activity  . Alcohol use: No  . Drug use: No  . Sexual activity: Not Currently  Other Topics Concern  . Not on file  Social History Narrative   Lives with wife.  They have two grown children.   He is retired from the [employer].   Social Determinants of Health   Financial Resource Strain:   . Difficulty of Paying Living Expenses:   Food Insecurity:   . Worried About Charity fundraiser in the Last Year:   . Arboriculturist in the Last Year:   Transportation Needs:   . Film/video editor (Medical):   Marland Kitchen Lack of Transportation (Non-Medical):   Physical Activity:   . Days of Exercise per Week:   . Minutes of Exercise per Session:   Stress:   . Feeling of Stress :   Social Connections:   . Frequency of Communication with Friends and Family:   . Frequency of Social Gatherings with Friends and Family:   . Attends Religious Services:   . Active Member of Clubs or Organizations:   . Attends Archivist Meetings:   Marland Kitchen Marital Status:   Intimate Partner Violence:   . Fear of Current or Ex-Partner:   . Emotionally Abused:   Marland Kitchen Physically Abused:   . Sexually Abused:     Current Outpatient Medications on File Prior to Visit  Medication Sig Dispense Refill  . calcium carbonate (OS-CAL) 600 MG TABS Take 600 mg by mouth daily.    . cetirizine-pseudoephedrine (ZYRTEC-D) 5-120 MG tablet  Take 1 tablet by mouth daily. 90 tablet 2  . EPINEPHrine 0.3 mg/0.3 mL IJ SOAJ injection Inject 0.3 mg into the muscle once as needed (allergic reaction).   0  . finasteride (PROSCAR) 5 MG tablet TAKE 1 TABLET DAILY 90 tablet 0  . fludrocortisone (FLORINEF) 0.1 MG tablet Take 1 tablet (0.1 mg total) by mouth daily. 90 tablet 2  . fluticasone (FLONASE) 50 MCG/ACT nasal spray Place 2 sprays into both nostrils daily. 16 g 6  . folic acid (FOLVITE) 1 MG tablet TAKE 1 TABLET DAILY 90 tablet 2  . IRON PO Take by mouth.    Marland Kitchen KLOR-CON M20 20 MEQ tablet Take 20 mEq by mouth daily.     Marland Kitchen levothyroxine (SYNTHROID) 50 MCG tablet Take 1 tablet (50 mcg total) by mouth daily. 90 tablet 3  . loperamide (IMODIUM A-D) 2 MG tablet Take 1 tablet (2 mg total) by mouth 2 (two) times daily as needed for diarrhea or loose stools. 30  tablet 1  . methocarbamol (ROBAXIN) 500 MG tablet TAKE 1 TABLET AT BEDTIME ASNEEDED FOR MUSCLE SPASM 90 tablet 0  . Multiple Vitamins-Minerals (CENTRUM SILVER PO) Take 1 tablet by mouth daily.      . Omega-3 Fatty Acids (FISH OIL) 1000 MG CAPS Take 1 capsule by mouth daily.    Marland Kitchen omeprazole (PRILOSEC) 40 MG capsule TAKE 1 CAPSULE DAILY 90 capsule 1  . rosuvastatin (CRESTOR) 5 MG tablet TAKE 1 TABLET DAILY 90 tablet 2  . Tamsulosin HCl (FLOMAX) 0.4 MG CAPS Take 0.4 mg by mouth daily.      . Testosterone 30 MG/ACT SOLN Place 2 Pump onto the skin daily.    . traMADol (ULTRAM) 50 MG tablet Take 1 tablet (50 mg total) by mouth every 6 (six) hours as needed. 15 tablet 0   No current facility-administered medications on file prior to visit.    Allergies  Allergen Reactions  . Lipitor [Atorvastatin] Other (See Comments)    Myalgias, cramps in hand    Family History  Problem Relation Age of Onset  . Cancer Brother        uncertain type  . Diabetes Mellitus II Mother        Deceased, 90s  . Heart attack Father   . Healthy Daughter     BP 116/70   Pulse 73   Ht 6\' 3"  (1.905 m)   Wt 208 lb (94.3 kg)   SpO2 98%   BMI 26.00 kg/m    Review of Systems Denies nausea.      Objective:   Physical Exam VITAL SIGNS:  See vs page GENERAL: no distress Pulses: dorsalis pedis intact bilat.   MSK: no deformity of the feet CV: trace bilat leg edema Skin:  no ulcer on the feet.  normal color and temp on the feet. Neuro: sensation is intact to touch on the feet  Lab Results  Component Value Date   CREATININE 1.27 02/09/2020   BUN 23 02/09/2020   NA 131 (L) 02/09/2020   K 4.4 02/09/2020   CL 98 02/09/2020   CO2 27 02/09/2020   Lab Results  Component Value Date   HGBA1C 9.5 (A) 02/09/2020        Assessment & Plan:  Type 2 DM, with PN: worse Edema: This limits rx options  Patient Instructions  Blood tests are requested for you today.  We'll let you know about the results.  If  it is high, we can add "repaglinide." check your  blood sugar once a day.  vary the time of day when you check, between before the 3 meals, and at bedtime.  also check if you have symptoms of your blood sugar being too high or too low.  please keep a record of the readings and bring it to your next appointment here (or you can bring the meter itself).  You can write it on any piece of paper.  please call us sooner if your blood sugar goes below 70, or if you have a lot of readings over 200. Please come back for a follow-up appointment in 2 months.

## 2020-03-22 NOTE — Patient Instructions (Addendum)
Blood tests are requested for you today.  We'll let you know about the results.  If it is high, we can add "repaglinide." check your blood sugar once a day.  vary the time of day when you check, between before the 3 meals, and at bedtime.  also check if you have symptoms of your blood sugar being too high or too low.  please keep a record of the readings and bring it to your next appointment here (or you can bring the meter itself).  You can write it on any piece of paper.  please call us sooner if your blood sugar goes below 70, or if you have a lot of readings over 200. Please come back for a follow-up appointment in 2 months.

## 2020-03-25 MED ORDER — METFORMIN HCL 500 MG PO TABS: 500.0000 mg | ORAL_TABLET | Freq: Two times a day (BID) | ORAL | 3 refills | Status: DC

## 2020-03-26 ENCOUNTER — Other Ambulatory Visit: Payer: Self-pay | Admitting: Endocrinology

## 2020-03-26 LAB — FRUCTOSAMINE: Fructosamine: 350 umol/L — ABNORMAL HIGH (ref 205–285)

## 2020-03-26 MED ORDER — REPAGLINIDE 1 MG PO TABS: 1.0000 mg | ORAL_TABLET | Freq: Three times a day (TID) | ORAL | 3 refills | Status: DC

## 2020-03-27 ENCOUNTER — Other Ambulatory Visit: Payer: Self-pay | Admitting: Endocrinology

## 2020-03-28 ENCOUNTER — Telehealth: Payer: Self-pay

## 2020-03-28 NOTE — Telephone Encounter (Signed)
-----   Message from Renato Shin, MD sent at 03/26/2020  8:06 AM EDT ----- please contact patient: This is like an A1c of 9.0%, which is high.  I have sent a prescription to your pharmacy, to add "repaglinide."  Please come back for a follow-up appointment in 2 months.

## 2020-03-28 NOTE — Telephone Encounter (Signed)
LAB RESULTS  Lab results were reviewed by Dr. Loanne Drilling. Called pt to inform about lab results as well as new orders. Using closed-loop communication, pt verbalized complete acceptance and understanding of all information provided. No further questions nor concerns were voiced at this time. At pt request, a letter has been mailed to pt home address. For future reference, letter can be found in [place].

## 2020-04-01 ENCOUNTER — Telehealth: Payer: Self-pay | Admitting: Endocrinology

## 2020-04-01 MED ORDER — METFORMIN HCL ER 500 MG PO TB24
1000.0000 mg | ORAL_TABLET | Freq: Every day | ORAL | 3 refills | Status: DC
Start: 2020-04-01 — End: 2021-04-12

## 2020-04-01 NOTE — Telephone Encounter (Signed)
Joseph Rocha with [facility] requests to be called at ph# [phone] re: [facility] needs clarification of RX for Metformin 500. Reference# [id number]. Joseph Rocha stated the question for clarification is in regards to Plain or extended release-patient has history of extended release.

## 2020-04-01 NOTE — Telephone Encounter (Signed)
Outpatient Medication Detail   Disp Refills Start End   metFORMIN (GLUCOPHAGE-XR) 500 MG 24 hr tablet 180 tablet 3 04/01/2020    Sig - Route: Take 2 tablets (1,000 mg total) by mouth daily with breakfast. - Oral   Sent to pharmacy as: metFORMIN (GLUCOPHAGE-XR) 500 MG 24 hr tablet   E-Prescribing Status: Receipt confirmed by pharmacy (04/01/2020  4:33 PM EDT)    Per Dr. Loanne Drilling, Rx has been corrected and re-sent to [facility] as indicated above.

## 2020-04-01 NOTE — Telephone Encounter (Signed)
Please review and clarify confusion below:  [facility] isn't sure if they should fill Metformin XR or Metformin.

## 2020-04-01 NOTE — Telephone Encounter (Signed)
I corrected and resent 

## 2020-04-19 ENCOUNTER — Other Ambulatory Visit: Payer: Self-pay | Admitting: Internal Medicine

## 2020-04-19 NOTE — Telephone Encounter (Signed)
Please refill as per office routine med refill policy (all routine meds refilled for 3 mo or monthly per pt preference up to one year from last visit, then month to month grace period for 3 mo, then further med refills will have to be denied)  

## 2020-05-23 ENCOUNTER — Other Ambulatory Visit: Payer: Self-pay

## 2020-05-23 ENCOUNTER — Ambulatory Visit (INDEPENDENT_AMBULATORY_CARE_PROVIDER_SITE_OTHER): Payer: Medicare Other | Admitting: Endocrinology

## 2020-05-23 VITALS — BP 108/58 | HR 67 | Ht 75.0 in | Wt 207.0 lb

## 2020-05-23 DIAGNOSIS — E119 Type 2 diabetes mellitus without complications: Secondary | ICD-10-CM | POA: Diagnosis not present

## 2020-05-23 LAB — POCT GLYCOSYLATED HEMOGLOBIN (HGB A1C): Hemoglobin A1C: 6.6 % — AB (ref 4.0–5.6)

## 2020-05-23 NOTE — Progress Notes (Signed)
Subjective:    Patient ID: Joseph Rocha, male    DOB: 04-03-1935, 85 y.o.   MRN: [id number]  HPI Pt returns for f/u of diabetes mellitus:  DM type: 2 Dx'ed: 0814 Complications: PN   Therapy: 2 oral meds.   DKA: never.   Severe hypoglycemia: never.   Pancreatitis: never.   Pancreatic imaging: normal on 2019 CT.   Other: he took insulin for a brief time, soon after dx.   SDOH: he could not afford Rybelsus.   Interval history: pt says cbg's are well-controlled.  He takes meds as rx'ed.  He has had diarrhea x 2 days.  Past Medical History:  Diagnosis Date  . Allergy   . Anemia   . Diabetes mellitus   . Elevated PSA   . Hyperlipidemia   . Hypertension   . Hypogonadism male   . OA (osteoarthritis)   . 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 DOPPLER ECHOCARDIOGRAPHY  12/04/2001  . ELECTROCARDIOGRAM  03/25/2006  . EP IMPLANTABLE DEVICE N/A 03/16/2016   Procedure: Loop Recorder Insertion;  Surgeon: Deboraha Sprang, MD;  Location: [facility];  Service: Cardiovascular;  Laterality: N/A;  . KNEE ARTHROSCOPY Left   . LUMBAR DISC SURGERY    . MENISCUS REPAIR Right may 2015  . TONSILLECTOMY    . TRANSPHENOIDAL / TRANSNASAL HYPOPHYSECTOMY / RESECTION PITUITARY TUMOR  2005    Social History   Socioeconomic History  . Marital status: Married    Spouse name: Not on file  . Number of children: 2  . Years of education: Not on file  . Highest education level: Not on file  Occupational History  . Occupation: retired    Fish farm manager: RETIRED  Tobacco Use  . Smoking status: Former Smoker    Quit date: 02/22/1957    Years since quitting: 63.2  . Smokeless tobacco: Never Used  Vaping Use  . Vaping Use: Never used  Substance and Sexual Activity  . Alcohol use: No  . Drug use: No  . Sexual activity: Not Currently  Other Topics Concern  . Not on file  Social History Narrative   Lives with wife.  They have two grown children.     He is retired from the [employer].   Social Determinants of Health   Financial Resource Strain:   . Difficulty of Paying Living Expenses:   Food Insecurity:   . Worried About Charity fundraiser in the Last Year:   . Arboriculturist in the Last Year:   Transportation Needs:   . Film/video editor (Medical):   Marland Kitchen Lack of Transportation (Non-Medical):   Physical Activity:   . Days of Exercise per Week:   . Minutes of Exercise per Session:   Stress:   . Feeling of Stress :   Social Connections:   . Frequency of Communication with Friends and Family:   . Frequency of Social Gatherings with Friends and Family:   . Attends Religious Services:   . Active Member of Clubs or Organizations:   . Attends Archivist Meetings:   Marland Kitchen Marital Status:   Intimate Partner Violence:   . Fear of Current or Ex-Partner:   . Emotionally Abused:   Marland Kitchen Physically Abused:   . Sexually Abused:     Current Outpatient Medications on File Prior to Visit  Medication Sig Dispense Refill  . cetirizine-pseudoephedrine (ZYRTEC-D) 5-120 MG tablet Take 1 tablet  by mouth daily. 90 tablet 2  . finasteride (PROSCAR) 5 MG tablet TAKE 1 TABLET DAILY 90 tablet 0  . fludrocortisone (FLORINEF) 0.1 MG tablet Take 1 tablet (0.1 mg total) by mouth daily. 90 tablet 2  . folic acid (FOLVITE) 1 MG tablet TAKE 1 TABLET DAILY 90 tablet 2  . levothyroxine (SYNTHROID) 50 MCG tablet Take 1 tablet (50 mcg total) by mouth daily. 90 tablet 3  . metFORMIN (GLUCOPHAGE-XR) 500 MG 24 hr tablet Take 2 tablets (1,000 mg total) by mouth daily with breakfast. 180 tablet 3  . methocarbamol (ROBAXIN) 500 MG tablet TAKE 1 TABLET AT BEDTIME ASNEEDED FOR MUSCLE SPASM 90 tablet 0  . repaglinide (PRANDIN) 1 MG tablet Take 1 tablet (1 mg total) by mouth 3 (three) times daily before meals. 270 tablet 3  . Testosterone 30 MG/ACT SOLN Place 2 Pump onto the skin daily.    . traMADol (ULTRAM) 50 MG tablet Take 1 tablet (50 mg total) by  mouth every 6 (six) hours as needed. 15 tablet 0  . calcium carbonate (OS-CAL) 600 MG TABS Take 600 mg by mouth daily.    Marland Kitchen EPINEPHrine 0.3 mg/0.3 mL IJ SOAJ injection Inject 0.3 mg into the muscle once as needed (allergic reaction).   0  . fluticasone (FLONASE) 50 MCG/ACT nasal spray Place 2 sprays into both nostrils daily. 16 g 6  . IRON PO Take by mouth.    Marland Kitchen KLOR-CON M20 20 MEQ tablet Take 20 mEq by mouth daily.     Marland Kitchen loperamide (IMODIUM A-D) 2 MG tablet Take 1 tablet (2 mg total) by mouth 2 (two) times daily as needed for diarrhea or loose stools. 30 tablet 1  . Multiple Vitamins-Minerals (CENTRUM SILVER PO) Take 1 tablet by mouth daily.      . Omega-3 Fatty Acids (FISH OIL) 1000 MG CAPS Take 1 capsule by mouth daily.    Marland Kitchen omeprazole (PRILOSEC) 40 MG capsule TAKE 1 CAPSULE DAILY 90 capsule 1  . rosuvastatin (CRESTOR) 5 MG tablet TAKE 1 TABLET DAILY 90 tablet 2  . Tamsulosin HCl (FLOMAX) 0.4 MG CAPS Take 0.4 mg by mouth daily.       No current facility-administered medications on file prior to visit.    Allergies  Allergen Reactions  . Lipitor [Atorvastatin] Other (See Comments)    Myalgias, cramps in hand    Family History  Problem Relation Age of Onset  . Cancer Brother        uncertain type  . Diabetes Mellitus II Mother        Deceased, 81s  . Heart attack Father   . Healthy Daughter     BP (!) 108/58 (BP Location: Left Arm, Patient Position: Sitting)   Pulse 67   Ht 6\' 3"  (1.905 m)   Wt 207 lb (93.9 kg)   SpO2 95%   BMI 25.87 kg/m    Review of Systems Denies n/v.      Objective:   Physical Exam VITAL SIGNS:  See vs page GENERAL: no distress Pulses: dorsalis pedis intact bilat.   MSK: no deformity of the feet CV: no leg edema Skin:  no ulcer on the feet.  normal color and temp on the feet. Neuro: sensation is intact to touch on the feet.    Lab Results  Component Value Date   HGBA1C 6.6 (A) 05/23/2020   Lab Results  Component Value Date   CREATININE  1.27 02/09/2020   BUN 23 02/09/2020   NA 131 (  L) 02/09/2020   K 4.4 02/09/2020   CL 98 02/09/2020   CO2 27 02/09/2020        Assessment & Plan:  Type 2 DM: well-controlled Diarrhea, new.  We discussed.  As it is new we should conclude it is not related to metformin.   Patient Instructions  Please continue the same medications The diarrhea almost always goes away on its own.  If it keeps up, please call Dr Gwynn Burly office check your blood sugar once a day.  vary the time of day when you check, between before the 3 meals, and at bedtime.  also check if you have symptoms of your blood sugar being too high or too low.  please keep a record of the readings and bring it to your next appointment here (or you can bring the meter itself).  You can write it on any piece of paper.  please call us sooner if your blood sugar goes below 70, or if you have a lot of readings over 200. Please come back for a follow-up appointment in 2 months.

## 2020-05-23 NOTE — Patient Instructions (Addendum)
Please continue the same medications The diarrhea almost always goes away on its own.  If it keeps up, please call Dr Gwynn Burly office check your blood sugar once a day.  vary the time of day when you check, between before the 3 meals, and at bedtime.  also check if you have symptoms of your blood sugar being too high or too low.  please keep a record of the readings and bring it to your next appointment here (or you can bring the meter itself).  You can write it on any piece of paper.  please call us sooner if your blood sugar goes below 70, or if you have a lot of readings over 200. Please come back for a follow-up appointment in 2 months.

## 2020-06-28 ENCOUNTER — Other Ambulatory Visit: Payer: Self-pay | Admitting: Endocrinology

## 2020-06-28 NOTE — Telephone Encounter (Signed)
Please review and advise if this should be forwarded to PCP

## 2020-07-13 ENCOUNTER — Ambulatory Visit (INDEPENDENT_AMBULATORY_CARE_PROVIDER_SITE_OTHER): Payer: Medicare Other | Admitting: Internal Medicine

## 2020-07-13 ENCOUNTER — Other Ambulatory Visit: Payer: Self-pay

## 2020-07-13 ENCOUNTER — Encounter: Payer: Self-pay | Admitting: Internal Medicine

## 2020-07-13 VITALS — BP 100/60 | HR 70 | Temp 97.9°F | Ht 75.0 in | Wt 206.0 lb

## 2020-07-13 DIAGNOSIS — E538 Deficiency of other specified B group vitamins: Secondary | ICD-10-CM

## 2020-07-13 DIAGNOSIS — E038 Other specified hypothyroidism: Secondary | ICD-10-CM | POA: Diagnosis not present

## 2020-07-13 DIAGNOSIS — E673 Hypervitaminosis D: Secondary | ICD-10-CM | POA: Diagnosis not present

## 2020-07-13 DIAGNOSIS — R42 Dizziness and giddiness: Secondary | ICD-10-CM | POA: Diagnosis not present

## 2020-07-13 DIAGNOSIS — E119 Type 2 diabetes mellitus without complications: Secondary | ICD-10-CM

## 2020-07-13 DIAGNOSIS — J309 Allergic rhinitis, unspecified: Secondary | ICD-10-CM | POA: Diagnosis not present

## 2020-07-13 DIAGNOSIS — E559 Vitamin D deficiency, unspecified: Secondary | ICD-10-CM

## 2020-07-13 MED ORDER — METHYLPREDNISOLONE ACETATE 80 MG/ML IJ SUSP
80.0000 mg | Freq: Once | INTRAMUSCULAR | Status: AC
  Administered 2020-07-13: 80 mg via INTRAMUSCULAR

## 2020-07-13 MED ORDER — TRIAMCINOLONE ACETONIDE 55 MCG/ACT NA AERO
2.0000 | INHALATION_SPRAY | Freq: Every day | NASAL | 12 refills | Status: DC
Start: 2020-07-13 — End: 2023-10-08

## 2020-07-13 MED ORDER — MECLIZINE HCL 12.5 MG PO TABS
12.5000 mg | ORAL_TABLET | Freq: Three times a day (TID) | ORAL | 1 refills | Status: DC | PRN
Start: 2020-07-13 — End: 2021-03-24

## 2020-07-13 MED ORDER — ASPIRIN EC 81 MG PO TBEC: 81.0000 mg | DELAYED_RELEASE_TABLET | Freq: Every day | ORAL | 11 refills | Status: AC

## 2020-07-13 MED ORDER — PREDNISONE 10 MG PO TABS: ORAL_TABLET | ORAL | 0 refills | Status: DC

## 2020-07-13 NOTE — Patient Instructions (Addendum)
You had the steroid shot today  Please take all new medication as prescribed - the nasacort, and prednisone and meclizine for the vertigo itself - all sent to [facility]  You can also take Mucinex (or it's generic off brand) for congestion, and tylenol as needed for pain.  You will be contacted regarding the referral for: Vestibular rehabilitation (a kind of physical therapy to help learn to prevent the vertigo)  Please continue all other medications as before, and refills have been done if requested.  Please have the pharmacy call with any other refills you may need.  Please continue your efforts at being more active, low cholesterol diet, and weight control.  Please keep your appointments with your specialists as you may have planned  Please go to the LAB at the blood drawing area for the tests to be done  You will be contacted by phone if any changes need to be made immediately.  Otherwise, you will receive a letter about your results with an explanation, but please check with MyChart first.  Please remember to sign up for MyChart if you have not done so, as this will be important to you in the future with finding out test results, communicating by private email, and scheduling acute appointments online when needed.  Please make an Appointment to return in 6 months, or sooner if needed

## 2020-07-13 NOTE — Progress Notes (Signed)
Subjective:    Patient ID: Joseph Rocha, male    DOB: Oct 22, 1935, 85 y.o.   MRN: [id number]  HPI Here to f/u; overall doing ok,  Pt denies chest pain, increasing sob or doe, wheezing, orthopnea, PND, increased LE swelling, palpitations, or syncope but has vertigo positional associated with worsening allergies - Does have several wks ongoing nasal allergy symptoms with clearish congestion, itch and sneezing, without fever, pain, ST, cough, swelling or wheezing.  Also with fatigue.  .  Pt denies new neurological symptoms such as new headache, or facial or extremity weakness or numbness.  Pt denies polydipsia, polyuria, or low sugar episode.  Pt states overall good compliance with meds, mostly trying to follow appropriate diet, with wt overall stable  Denies hyper or hypo thyroid symptoms such as voice, skin or hair change. Past Medical History:  Diagnosis Date  . Allergy   . Anemia   . Diabetes mellitus   . Elevated PSA   . Hyperlipidemia   . Hypertension   . Hypogonadism male   . OA (osteoarthritis)   . Pituitary abnormality ([place]) 2004   Past Surgical History:  Procedure Laterality Date  . DOPPLER ECHOCARDIOGRAPHY  12/04/2001  . ELECTROCARDIOGRAM  03/25/2006  . EP IMPLANTABLE DEVICE N/A 03/16/2016   Procedure: Loop Recorder Insertion;  Surgeon: Deboraha Sprang, MD;  Location: [facility];  Service: Cardiovascular;  Laterality: N/A;  . KNEE ARTHROSCOPY Left   . LUMBAR DISC SURGERY    . MENISCUS REPAIR Right may 2015  . TONSILLECTOMY    . TRANSPHENOIDAL / TRANSNASAL HYPOPHYSECTOMY / RESECTION PITUITARY TUMOR  2005    reports that he quit smoking about 63 years ago. He has never used smokeless tobacco. He reports that he does not drink alcohol and does not use drugs. family history includes Cancer in his brother; Diabetes Mellitus II in his mother; Healthy in his daughter; Heart attack in his father. Allergies  Allergen Reactions  . Lipitor [Atorvastatin] Other (See Comments)     Myalgias, cramps in hand   Current Outpatient Medications on File Prior to Visit  Medication Sig Dispense Refill  . calcium carbonate (OS-CAL) 600 MG TABS Take 600 mg by mouth daily.    . cetirizine-pseudoephedrine (ZYRTEC-D) 5-120 MG tablet Take 1 tablet by mouth daily. 90 tablet 2  . EPINEPHrine 0.3 mg/0.3 mL IJ SOAJ injection Inject 0.3 mg into the muscle once as needed (allergic reaction).   0  . fludrocortisone (FLORINEF) 0.1 MG tablet Take 1 tablet (0.1 mg total) by mouth daily. 90 tablet 2  . fluticasone (FLONASE) 50 MCG/ACT nasal spray Place 2 sprays into both nostrils daily. 16 g 6  . folic acid (FOLVITE) 1 MG tablet TAKE 1 TABLET DAILY 90 tablet 2  . IRON PO Take by mouth.    Marland Kitchen KLOR-CON M20 20 MEQ tablet Take 20 mEq by mouth daily.     Marland Kitchen levothyroxine (SYNTHROID) 50 MCG tablet Take 1 tablet (50 mcg total) by mouth daily. 90 tablet 3  . loperamide (IMODIUM A-D) 2 MG tablet Take 1 tablet (2 mg total) by mouth 2 (two) times daily as needed for diarrhea or loose stools. 30 tablet 1  . metFORMIN (GLUCOPHAGE-XR) 500 MG 24 hr tablet Take 2 tablets (1,000 mg total) by mouth daily with breakfast. 180 tablet 3  . methocarbamol (ROBAXIN) 500 MG tablet TAKE 1 TABLET AT BEDTIME ASNEEDED FOR MUSCLE SPASM 90 tablet 0  . Multiple Vitamins-Minerals (CENTRUM SILVER PO) Take 1 tablet by  mouth daily.      . Omega-3 Fatty Acids (FISH OIL) 1000 MG CAPS Take 1 capsule by mouth daily.    Marland Kitchen omeprazole (PRILOSEC) 40 MG capsule TAKE 1 CAPSULE DAILY 90 capsule 1  . repaglinide (PRANDIN) 1 MG tablet Take 1 tablet (1 mg total) by mouth 3 (three) times daily before meals. 270 tablet 3  . rosuvastatin (CRESTOR) 5 MG tablet TAKE 1 TABLET DAILY 90 tablet 2  . Tamsulosin HCl (FLOMAX) 0.4 MG CAPS Take 0.4 mg by mouth daily.      . Testosterone 30 MG/ACT SOLN Place 2 Pump onto the skin daily.    . traMADol (ULTRAM) 50 MG tablet Take 1 tablet (50 mg total) by mouth every 6 (six) hours as needed. 15 tablet 0   No  current facility-administered medications on file prior to visit.   Review of Systems All otherwise neg per pt    Objective:   Physical Exam BP 100/60 (BP Location: Left Arm, Patient Position: Sitting, Cuff Size: Large)   Pulse 70   Temp 97.9 F (36.6 C) (Oral)   Ht 6\' 3"  (1.905 m)   Wt 206 lb (93.4 kg)   SpO2 96%   BMI 25.75 kg/m  VS noted,  Constitutional: Pt appears in NAD HENT: Head: NCAT.  Right Ear: External ear normal.  Left Ear: External ear normal.  Eyes: . Pupils are equal, round, and reactive to light. Conjunctivae and EOM are normal Nose: without d/c or deformity Neck: Neck supple. Gross normal ROM Cardiovascular: Normal rate and regular rhythm.   Pulmonary/Chest: Effort normal and breath sounds without rales or wheezing.  Abd:  Soft, NT, ND, + BS, no organomegaly Neurological: Pt is alert. At baseline orientation, motor grossly intact Skin: Skin is warm. No rashes, other new lesions, no LE edema Psychiatric: Pt behavior is normal without agitation  All otherwise neg per pt Lab Results  Component Value Date   WBC 6.2 07/13/2020   HGB 13.1 (L) 07/13/2020   HCT 39.2 07/13/2020   PLT 208 07/13/2020   GLUCOSE 108 (H) 07/13/2020   CHOL 160 07/13/2020   TRIG 106 07/13/2020   HDL 46 07/13/2020   LDLCALC 94 07/13/2020   ALT 14 07/13/2020   AST 24 07/13/2020   NA 132 (L) 07/13/2020   K 4.7 07/13/2020   CL 98 07/13/2020   CREATININE 1.43 (H) 07/13/2020   BUN 17 07/13/2020   CO2 29 07/13/2020   TSH 3.34 07/13/2020   PSA 3.04 07/07/2014   HGBA1C 6.5 (H) 07/13/2020   MICROALBUR 0.2 07/13/2020      Assessment & Plan:

## 2020-07-14 ENCOUNTER — Encounter: Payer: Self-pay | Admitting: Internal Medicine

## 2020-07-14 DIAGNOSIS — R972 Elevated prostate specific antigen [PSA]: Secondary | ICD-10-CM | POA: Diagnosis not present

## 2020-07-14 DIAGNOSIS — E291 Testicular hypofunction: Secondary | ICD-10-CM | POA: Diagnosis not present

## 2020-07-14 LAB — CBC WITH DIFFERENTIAL/PLATELET
Absolute Monocytes: 744 cells/uL (ref 200–950)
Basophils Absolute: 31 cells/uL (ref 0–200)
Basophils Relative: 0.5 %
Eosinophils Absolute: 229 cells/uL (ref 15–500)
Eosinophils Relative: 3.7 %
HCT: 39.2 % (ref 38.5–50.0)
Hemoglobin: 13.1 g/dL — ABNORMAL LOW (ref 13.2–17.1)
Lymphs Abs: 2226 cells/uL (ref 850–3900)
MCH: 30.7 pg (ref 27.0–33.0)
MCHC: 33.4 g/dL (ref 32.0–36.0)
MCV: 91.8 fL (ref 80.0–100.0)
MPV: 9.5 fL (ref 7.5–12.5)
Monocytes Relative: 12 %
Neutro Abs: 2970 cells/uL (ref 1500–7800)
Neutrophils Relative %: 47.9 %
Platelets: 208 10*3/uL (ref 140–400)
RBC: 4.27 10*6/uL (ref 4.20–5.80)
RDW: 12.7 % (ref 11.0–15.0)
Total Lymphocyte: 35.9 %
WBC: 6.2 10*3/uL (ref 3.8–10.8)

## 2020-07-14 LAB — COMPLETE METABOLIC PANEL WITHOUT GFR
AG Ratio: 1.5 (calc) (ref 1.0–2.5)
ALT: 14 U/L (ref 9–46)
AST: 24 U/L (ref 10–35)
Albumin: 4.2 g/dL (ref 3.6–5.1)
Alkaline phosphatase (APISO): 53 U/L (ref 35–144)
BUN/Creatinine Ratio: 12 (calc) (ref 6–22)
BUN: 17 mg/dL (ref 7–25)
CO2: 29 mmol/L (ref 20–32)
Calcium: 9.5 mg/dL (ref 8.6–10.3)
Chloride: 98 mmol/L (ref 98–110)
Creat: 1.43 mg/dL — ABNORMAL HIGH (ref 0.70–1.11)
GFR, Est African American: 52 mL/min/{1.73_m2} — ABNORMAL LOW
GFR, Est Non African American: 45 mL/min/{1.73_m2} — ABNORMAL LOW
Globulin: 2.8 g/dL (ref 1.9–3.7)
Glucose, Bld: 108 mg/dL — ABNORMAL HIGH (ref 65–99)
Potassium: 4.7 mmol/L (ref 3.5–5.3)
Sodium: 132 mmol/L — ABNORMAL LOW (ref 135–146)
Total Bilirubin: 0.5 mg/dL (ref 0.2–1.2)
Total Protein: 7 g/dL (ref 6.1–8.1)

## 2020-07-14 LAB — HEMOGLOBIN A1C
Hgb A1c MFr Bld: 6.5 % of total Hgb — ABNORMAL HIGH (ref <5.7)
Mean Plasma Glucose: 140 (calc)
eAG (mmol/L): 7.7 (calc)

## 2020-07-14 LAB — LIPID PANEL
Cholesterol: 160 mg/dL
HDL: 46 mg/dL
LDL Cholesterol (Calc): 94 mg/dL
Non-HDL Cholesterol (Calc): 114 mg/dL
Total CHOL/HDL Ratio: 3.5 (calc)
Triglycerides: 106 mg/dL

## 2020-07-14 LAB — VITAMIN B12: Vitamin B-12: 420 pg/mL (ref 200–1100)

## 2020-07-14 LAB — URINALYSIS, ROUTINE W REFLEX MICROSCOPIC
Bilirubin Urine: NEGATIVE
Glucose, UA: NEGATIVE
Hgb urine dipstick: NEGATIVE
Ketones, ur: NEGATIVE
Leukocytes,Ua: NEGATIVE
Nitrite: NEGATIVE
Protein, ur: NEGATIVE
Specific Gravity, Urine: 1.012 (ref 1.001–1.03)
pH: 5.5 (ref 5.0–8.0)

## 2020-07-14 LAB — VITAMIN D 25 HYDROXY (VIT D DEFICIENCY, FRACTURES): Vit D, 25-Hydroxy: 47 ng/mL (ref 30–100)

## 2020-07-14 LAB — MICROALBUMIN / CREATININE URINE RATIO
Creatinine, Urine: 94 mg/dL (ref 20–320)
Microalb Creat Ratio: 2 mcg/mg creat (ref <30)
Microalb, Ur: 0.2 mg/dL

## 2020-07-14 LAB — TSH: TSH: 3.34 m[IU]/L (ref 0.40–4.50)

## 2020-07-15 ENCOUNTER — Other Ambulatory Visit: Payer: Self-pay | Admitting: Internal Medicine

## 2020-07-15 ENCOUNTER — Telehealth: Payer: Self-pay | Admitting: Internal Medicine

## 2020-07-15 ENCOUNTER — Telehealth (INDEPENDENT_AMBULATORY_CARE_PROVIDER_SITE_OTHER): Payer: Self-pay

## 2020-07-15 NOTE — Telephone Encounter (Signed)
   Patient calling to discuss lab results 

## 2020-07-16 ENCOUNTER — Encounter: Payer: Self-pay | Admitting: Internal Medicine

## 2020-07-16 NOTE — Assessment & Plan Note (Signed)
Cont oral replacement 

## 2020-07-16 NOTE — Assessment & Plan Note (Signed)
Mild to mod, for depomedrol 80 im, nasacort, mucines,predpac,,  to f/u any worsening symptoms or concerns

## 2020-07-16 NOTE — Assessment & Plan Note (Addendum)
C/w veritgo, for meclizine prn, also vestibular rehab  I spent 31 minutes in preparing to see the patient by review of recent labs, imaging and procedures, obtaining and reviewing separately obtained history, communicating with the patient and family or caregiver, ordering medications, tests or procedures, and documenting clinical information in the EHR including the differential Dx, treatment, and any further evaluation and other management of vertigo/dizziness, allergies, dm, hypothyroidism, vit d deficiency

## 2020-07-16 NOTE — Assessment & Plan Note (Signed)
stable overall by history and exam, recent data reviewed with pt, and pt to continue medical treatment as before,  to f/u any worsening symptoms or concerns  

## 2020-07-18 NOTE — Telephone Encounter (Signed)
I called pt and LDVM of Dr. Gwynn Burly note about his labs.

## 2020-07-19 NOTE — Telephone Encounter (Signed)
Patient called and was asking if his lab results had been mailed. From 07/13/2020 visit.

## 2020-07-19 NOTE — Telephone Encounter (Signed)
LVM for pt informing him that a copy of his results have been sent via mail on yesterday.

## 2020-07-20 ENCOUNTER — Encounter (INDEPENDENT_AMBULATORY_CARE_PROVIDER_SITE_OTHER): Payer: Self-pay | Admitting: Otolaryngology

## 2020-07-20 ENCOUNTER — Ambulatory Visit (INDEPENDENT_AMBULATORY_CARE_PROVIDER_SITE_OTHER): Payer: Medicare Other | Admitting: Otolaryngology

## 2020-07-20 ENCOUNTER — Other Ambulatory Visit: Payer: Self-pay

## 2020-07-20 VITALS — Temp 97.2°F

## 2020-07-20 DIAGNOSIS — R42 Dizziness and giddiness: Secondary | ICD-10-CM | POA: Diagnosis not present

## 2020-07-20 DIAGNOSIS — H6123 Impacted cerumen, bilateral: Secondary | ICD-10-CM | POA: Diagnosis not present

## 2020-07-20 NOTE — Progress Notes (Signed)
HPI: Joseph Rocha is a 85 y.o. male who presents for evaluation of dizziness.  He does not really describe true vertigo but more of an imbalance.  He has been using a cane for over 8 years because of dropfoot.  He was recently seen by his PCP and treated with steroids and meclizine because of the dizziness and some erythema of the left TM.  This seemed to help as his dizziness is better.  Presents today to have his ears checked.  Past Medical History:  Diagnosis Date   Allergy    Anemia    Diabetes mellitus    Elevated PSA    Hyperlipidemia    Hypertension    Hypogonadism male    OA (osteoarthritis)    Pituitary abnormality ([place]) 2004   Past Surgical History:  Procedure Laterality Date   DOPPLER ECHOCARDIOGRAPHY  12/04/2001   ELECTROCARDIOGRAM  03/25/2006   EP IMPLANTABLE DEVICE N/A 03/16/2016   Procedure: Loop Recorder Insertion;  Surgeon: Deboraha Sprang, MD;  Location: [facility];  Service: Cardiovascular;  Laterality: N/A;   KNEE ARTHROSCOPY Left    LUMBAR DISC SURGERY     MENISCUS REPAIR Right may 2015   TONSILLECTOMY     TRANSPHENOIDAL / TRANSNASAL HYPOPHYSECTOMY / RESECTION PITUITARY TUMOR  2005   Social History   Socioeconomic History   Marital status: Married    Spouse name: Not on file   Number of children: 2   Years of education: Not on file   Highest education level: Not on file  Occupational History   Occupation: retired    Fish farm manager: RETIRED  Tobacco Use   Smoking status: Never Smoker   Smokeless tobacco: Never Used  Scientific laboratory technician Use: Never used  Substance and Sexual Activity   Alcohol use: No   Drug use: No   Sexual activity: Not Currently  Other Topics Concern   Not on file  Social History Narrative   Lives with wife.  They have two grown children.   He is retired from the [employer].   Social Determinants of Health   Financial Resource Strain:    Difficulty of Paying Living Expenses: Not on file   Food Insecurity:    Worried About Charity fundraiser in the Last Year: Not on file   [employer] of Food in the Last Year: Not on file  Transportation Needs:    Lack of Transportation (Medical): Not on file   Lack of Transportation (Non-Medical): Not on file  Physical Activity:    Days of Exercise per Week: Not on file   Minutes of Exercise per Session: Not on file  Stress:    Feeling of Stress : Not on file  Social Connections:    Frequency of Communication with Friends and Family: Not on file   Frequency of Social Gatherings with Friends and Family: Not on file   Attends Religious Services: Not on file   Active Member of Clubs or Organizations: Not on file   Attends Archivist Meetings: Not on file   Marital Status: Not on file   Family History  Problem Relation Age of Onset   Cancer Brother        uncertain type   Diabetes Mellitus II Mother        Deceased, 79s   Heart attack Father    Healthy Daughter    Allergies  Allergen Reactions   Lipitor [Atorvastatin] Other (See Comments)    Myalgias, cramps  in hand   Prior to Admission medications   Medication Sig Start Date End Date Taking? Authorizing Provider  aspirin EC 81 MG tablet Take 1 tablet (81 mg total) by mouth daily. Swallow whole. 07/13/20   Biagio Borg, MD  calcium carbonate (OS-CAL) 600 MG TABS Take 600 mg by mouth daily.    [provider]  cetirizine-pseudoephedrine (ZYRTEC-D) 5-120 MG tablet Take 1 tablet by mouth daily. 05/09/17   Renato Shin, MD  EPINEPHrine 0.3 mg/0.3 mL IJ SOAJ injection Inject 0.3 mg into the muscle once as needed (allergic reaction).  11/08/16   [provider]  finasteride (PROSCAR) 5 MG tablet TAKE 1 TABLET DAILY 07/15/20   Biagio Borg, MD  fludrocortisone (FLORINEF) 0.1 MG tablet Take 1 tablet (0.1 mg total) by mouth daily. 02/18/18   Renato Shin, MD  fluticasone (FLONASE) 50 MCG/ACT nasal spray Place 2 sprays into both nostrils daily.  10/15/18   Hoyt Koch, MD  folic acid (FOLVITE) 1 MG tablet TAKE 1 TABLET DAILY 06/28/20   Renato Shin, MD  IRON PO Take by mouth.    [provider]  KLOR-CON M20 20 MEQ tablet Take 20 mEq by mouth daily.  03/15/13   [provider]  levothyroxine (SYNTHROID) 50 MCG tablet Take 1 tablet (50 mcg total) by mouth daily. 02/18/20   Renato Shin, MD  loperamide (IMODIUM A-D) 2 MG tablet Take 1 tablet (2 mg total) by mouth 2 (two) times daily as needed for diarrhea or loose stools. 01/10/18   Levin Erp, PA  meclizine (ANTIVERT) 12.5 MG tablet Take 1 tablet (12.5 mg total) by mouth 3 (three) times daily as needed for dizziness. 07/13/20 07/13/21  Biagio Borg, MD  metFORMIN (GLUCOPHAGE-XR) 500 MG 24 hr tablet Take 2 tablets (1,000 mg total) by mouth daily with breakfast. 04/01/20   Renato Shin, MD  methocarbamol (ROBAXIN) 500 MG tablet TAKE 1 TABLET AT BEDTIME ASNEEDED FOR MUSCLE SPASM 09/17/18   Renato Shin, MD  Multiple Vitamins-Minerals (CENTRUM SILVER PO) Take 1 tablet by mouth daily.      [provider]  Omega-3 Fatty Acids (FISH OIL) 1000 MG CAPS Take 1 capsule by mouth daily.    [provider]  omeprazole (PRILOSEC) 40 MG capsule TAKE 1 CAPSULE DAILY 04/20/20   Biagio Borg, MD  predniSONE (DELTASONE) 10 MG tablet 3 tabs by mouth per day for 3 days,2tabs per day for 3 days,1tab per day for 3 days 07/13/20   Biagio Borg, MD  repaglinide (PRANDIN) 1 MG tablet Take 1 tablet (1 mg total) by mouth 3 (three) times daily before meals. 03/26/20   Renato Shin, MD  rosuvastatin (CRESTOR) 5 MG tablet TAKE 1 TABLET DAILY 04/20/20   Biagio Borg, MD  Tamsulosin HCl (FLOMAX) 0.4 MG CAPS Take 0.4 mg by mouth daily.      [provider]  Testosterone 30 MG/ACT SOLN Place 2 Pump onto the skin daily.    [provider]  traMADol (ULTRAM) 50 MG tablet Take 1 tablet (50 mg total) by mouth every 6 (six) hours as needed. 05/07/18   Renato Shin, MD  triamcinolone (NASACORT) 55 MCG/ACT AERO nasal inhaler Place 2 sprays into the nose daily. 07/13/20   Biagio Borg, MD     Positive ROS: Otherwise negative  All other systems have been reviewed and were otherwise negative with the exception of those mentioned in the HPI and as above.  Physical Exam: Constitutional: Alert,  well-appearing, no acute distress Ears: External ears without lesions or tenderness.  He had moderate wax buildup more so on the right side than the left.  This was cleaned in the office using forceps and curettes.  TMs were clear bilaterally with good mobility on pneumatic otoscopy.  Dix-Hallpike testing was negative.  Hearing screening with a tuning forks revealed minimal hearing loss with AC > BC bilaterally. Nasal: External nose without lesions. Clear nasal passages Oral: Lips and gums without lesions. Tongue and palate mucosa without lesions. Posterior oropharynx clear. Neck: No palpable adenopathy or masses Respiratory: Breathing comfortably  Skin: No facial/neck lesions or rash noted.  Cerumen impaction removal  Date/Time: 07/20/2020 1:09 PM Performed by: Rozetta Nunnery, MD Authorized by: Rozetta Nunnery, MD   Consent:    Consent obtained:  Verbal   Consent given by:  Patient   Risks discussed:  Pain and bleeding Procedure details:    Location:  L ear and R ear   Procedure type: curette and forceps   Post-procedure details:    Inspection:  TM intact and canal normal   Hearing quality:  Improved   Patient tolerance of procedure:  Tolerated well, no immediate complications Comments:     TMs are clear bilaterally    Assessment: Dizziness is doing better presently. Cerumen buildup bilaterally  Plan: Reassured him of no clinical evidence of vertigo presently.  No TM abnormality noted on either side with minimal hearing loss. Recommend use of the meclizine as needed dizziness and will follow up as needed  Radene Journey, MD

## 2020-07-21 ENCOUNTER — Telehealth: Payer: Self-pay

## 2020-07-21 DIAGNOSIS — N401 Enlarged prostate with lower urinary tract symptoms: Secondary | ICD-10-CM | POA: Diagnosis not present

## 2020-07-21 DIAGNOSIS — R351 Nocturia: Secondary | ICD-10-CM | POA: Diagnosis not present

## 2020-07-21 DIAGNOSIS — E291 Testicular hypofunction: Secondary | ICD-10-CM | POA: Diagnosis not present

## 2020-07-21 NOTE — Telephone Encounter (Signed)
Pt states he was told that he will be getting his lab results in the mail & he has not rec'd them.  Reviewed Maria's note from 9/14 stating that the results were mailed out on 9/13.  Pt reassured that he should be getting the results soon, however, I read the results to him (that were highlighted in yellow) & informed him that Dr Jenny Reichmann said all labs were stable & to continue same treatments.   Advised pt that if he had not rec'd his results in the mail by mid week next week to call me & I will personally ensure they are mailed out.   Pt satisfied with response & thankful.

## 2020-08-09 ENCOUNTER — Other Ambulatory Visit: Payer: Self-pay | Admitting: Endocrinology

## 2020-08-09 NOTE — Telephone Encounter (Signed)
Please advise if refill is appropriate 

## 2020-08-09 NOTE — Telephone Encounter (Signed)
Please forward refill request to pt's primary care provider.   

## 2020-08-09 NOTE — Telephone Encounter (Signed)
Medication Refill Request  Did you call your pharmacy and request this refill first? Yes  . If patient has not contacted pharmacy first, instruct them to do so for future refills.  . Remind them that contacting the pharmacy for their refill is the quickest method to get the refill.  . Refill policy also stated that it will take anywhere between 24-72 hours to receive the refill.    Name of medication? methocarbamol   Is this a 90 day supply? yes  Name and location of pharmacy?   [facility], [place] to [address] [place] Sites Phone:  [phone]  Fax:  [phone]

## 2020-09-17 ENCOUNTER — Other Ambulatory Visit: Payer: Self-pay | Admitting: Endocrinology

## 2020-09-19 ENCOUNTER — Ambulatory Visit: Payer: Medicare Other | Admitting: Internal Medicine

## 2020-10-31 ENCOUNTER — Other Ambulatory Visit: Payer: Self-pay | Admitting: Internal Medicine

## 2020-10-31 NOTE — Telephone Encounter (Signed)
Please refill as per office routine med refill policy (all routine meds refilled for 3 mo or monthly per pt preference up to one year from last visit, then month to month grace period for 3 mo, then further med refills will have to be denied)  

## 2020-11-17 DIAGNOSIS — J3089 Other allergic rhinitis: Secondary | ICD-10-CM | POA: Diagnosis not present

## 2020-11-17 DIAGNOSIS — J301 Allergic rhinitis due to pollen: Secondary | ICD-10-CM | POA: Diagnosis not present

## 2020-11-17 DIAGNOSIS — Z23 Encounter for immunization: Secondary | ICD-10-CM | POA: Diagnosis not present

## 2020-12-05 ENCOUNTER — Telehealth: Payer: Self-pay | Admitting: Internal Medicine

## 2020-12-05 NOTE — Telephone Encounter (Signed)
LVM for pt to rtn my call to schedule awv with nha. Please schedule this appt if pt calls the office.  

## 2020-12-30 ENCOUNTER — Other Ambulatory Visit: Payer: Self-pay

## 2020-12-30 ENCOUNTER — Encounter: Payer: Self-pay | Admitting: Internal Medicine

## 2020-12-30 ENCOUNTER — Ambulatory Visit (INDEPENDENT_AMBULATORY_CARE_PROVIDER_SITE_OTHER): Payer: Medicare Other | Admitting: Internal Medicine

## 2020-12-30 VITALS — BP 122/74 | HR 74 | Ht 75.0 in | Wt 215.0 lb

## 2020-12-30 DIAGNOSIS — E1165 Type 2 diabetes mellitus with hyperglycemia: Secondary | ICD-10-CM

## 2020-12-30 DIAGNOSIS — H9203 Otalgia, bilateral: Secondary | ICD-10-CM | POA: Diagnosis not present

## 2020-12-30 DIAGNOSIS — E039 Hypothyroidism, unspecified: Secondary | ICD-10-CM

## 2020-12-30 DIAGNOSIS — E673 Hypervitaminosis D: Secondary | ICD-10-CM | POA: Diagnosis not present

## 2020-12-30 MED ORDER — AZITHROMYCIN 250 MG PO TABS: ORAL_TABLET | ORAL | 1 refills | Status: DC

## 2020-12-30 MED ORDER — METHOCARBAMOL 500 MG PO TABS
ORAL_TABLET | ORAL | 1 refills | Status: DC
Start: 2020-12-30 — End: 2023-09-02

## 2020-12-30 NOTE — Patient Instructions (Signed)
Please take all new medication as prescribed - the antibiotic  Please continue all other medications as before, and refills have been done if requested.  Please have the pharmacy call with any other refills you may need.  Please keep your appointments with your specialists as you may have planned   

## 2020-12-31 ENCOUNTER — Encounter: Payer: Self-pay | Admitting: Internal Medicine

## 2020-12-31 DIAGNOSIS — H9203 Otalgia, bilateral: Secondary | ICD-10-CM | POA: Insufficient documentation

## 2020-12-31 NOTE — Assessment & Plan Note (Signed)
Lab Results  Component Value Date   TSH 3.34 07/13/2020   Stable, pt to continue levothyroxine

## 2020-12-31 NOTE — Assessment & Plan Note (Signed)
Last vitamin D Lab Results  Component Value Date   VD25OH 47 07/13/2020   Stable, cont oral replacement

## 2020-12-31 NOTE — Progress Notes (Signed)
Chief Complaint: bilateral ear pain for 5 days       HPI:  Joseph Rocha is a 86 y.o. male here with c/o bilateral ear pain for 5 days with feverish feeling, ST, non prod cough, but no significant sinus pain or congestion. Nothing else makes better or worse.  Pt denies chest pain, increased sob or doe, wheezing, orthopnea, PND, increased LE swelling, palpitations, dizziness or syncope.  Pt denies polydipsia, polyuria, Denies focal new onset neuro s/s.  No other new complaints.  Denies hyper or hypo thyroid symptoms such as voice, skin or hair change.  Taking Vit d. .        Wt Readings from Last 3 Encounters:  12/30/20 215 lb (97.5 kg)  07/13/20 206 lb (93.4 kg)  05/23/20 207 lb (93.9 kg)   BP Readings from Last 3 Encounters:  12/30/20 122/74  07/13/20 100/60  05/23/20 (!) 108/58         Past Medical History:  Diagnosis Date  . Allergy   . Anemia   . Diabetes mellitus   . Elevated PSA   . Hyperlipidemia   . Hypertension   . Hypogonadism male   . OA (osteoarthritis)   . 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 DOPPLER ECHOCARDIOGRAPHY  12/04/2001  . ELECTROCARDIOGRAM  03/25/2006  . EP IMPLANTABLE DEVICE N/A 03/16/2016   Procedure: Loop Recorder Insertion;  Surgeon: Deboraha Sprang, MD;  Location: [facility];  Service: Cardiovascular;  Laterality: N/A;  . KNEE ARTHROSCOPY Left   . LUMBAR DISC SURGERY    . MENISCUS REPAIR Right may 2015  . TONSILLECTOMY    . TRANSPHENOIDAL / TRANSNASAL HYPOPHYSECTOMY / RESECTION PITUITARY TUMOR  2005    reports that he has never smoked. He has never used smokeless tobacco. He reports that he does not drink alcohol and does not use drugs. family history includes Cancer in his brother; Diabetes Mellitus II in his mother; Healthy in his daughter; Heart attack in his father. Allergies  Allergen Reactions  . Lipitor [Atorvastatin] Other (See Comments)    Myalgias, cramps in hand    Current Outpatient Medications on File Prior to Visit  Medication Sig Dispense Refill  . aspirin EC 81 MG tablet Take 1 tablet (81 mg total) by mouth daily. Swallow whole. 30 tablet 11  . calcium carbonate (OS-CAL) 600 MG TABS Take 600 mg by mouth daily.    . cetirizine-pseudoephedrine (ZYRTEC-D) 5-120 MG tablet Take 1 tablet by mouth daily. 90 tablet 2  . EPINEPHrine 0.3 mg/0.3 mL IJ SOAJ injection Inject 0.3 mg into the muscle once as needed (allergic reaction).   0  . finasteride (PROSCAR) 5 MG tablet TAKE 1 TABLET DAILY 90 tablet 1  . fludrocortisone (FLORINEF) 0.1 MG tablet Take 1 tablet (0.1 mg total) by mouth daily. 90 tablet 2  . fluticasone (FLONASE) 50 MCG/ACT nasal spray Place 2 sprays into both nostrils daily. 16 g 6  . folic acid (FOLVITE) 1 MG tablet TAKE 1 TABLET DAILY 90 tablet 2  . IRON PO Take by mouth.    Marland Kitchen KLOR-CON M20 20 MEQ tablet Take 20 mEq by mouth daily.     Marland Kitchen levothyroxine (SYNTHROID) 50 MCG tablet Take 1 tablet (50 mcg total) by mouth daily. 90 tablet 3  . loperamide (IMODIUM A-D) 2 MG tablet Take 1 tablet (2 mg total) by mouth 2 (two) times daily as needed for diarrhea or  loose stools. 30 tablet 1  . meclizine (ANTIVERT) 12.5 MG tablet Take 1 tablet (12.5 mg total) by mouth 3 (three) times daily as needed for dizziness. 30 tablet 1  . metFORMIN (GLUCOPHAGE) 500 MG tablet TAKE 1 TABLET TWICE DAILY  WITH MEALS 180 tablet 3  . metFORMIN (GLUCOPHAGE-XR) 500 MG 24 hr tablet Take 2 tablets (1,000 mg total) by mouth daily with breakfast. 180 tablet 3  . Multiple Vitamins-Minerals (CENTRUM SILVER PO) Take 1 tablet by mouth daily.    . Omega-3 Fatty Acids (FISH OIL) 1000 MG CAPS Take 1 capsule by mouth daily.    Marland Kitchen omeprazole (PRILOSEC) 40 MG capsule TAKE 1 CAPSULE DAILY 90 capsule 1  . predniSONE (DELTASONE) 10 MG tablet 3 tabs by mouth per day for 3 days,2tabs per day for 3 days,1tab per day for 3 days 18 tablet 0  . repaglinide (PRANDIN) 1 MG tablet Take 1 tablet (1 mg  total) by mouth 3 (three) times daily before meals. 270 tablet 3  . rosuvastatin (CRESTOR) 5 MG tablet TAKE 1 TABLET DAILY 90 tablet 2  . Tamsulosin HCl (FLOMAX) 0.4 MG CAPS Take 0.4 mg by mouth daily.    . Testosterone 30 MG/ACT SOLN Place 2 Pump onto the skin daily.    . traMADol (ULTRAM) 50 MG tablet Take 1 tablet (50 mg total) by mouth every 6 (six) hours as needed. 15 tablet 0  . triamcinolone (NASACORT) 55 MCG/ACT AERO nasal inhaler Place 2 sprays into the nose daily. 1 each 12  . loperamide (LOPERAMIDE A-D) 2 MG tablet      No current facility-administered medications on file prior to visit.        ROS:  All others reviewed and negative.  Objective        PE:  BP 122/74   Pulse 74   Ht 6\' 3"  (1.905 m)   Wt 215 lb (97.5 kg)   SpO2 96%   BMI 26.87 kg/m                 Constitutional: Pt appears in NAD               HENT: Head: NCAT.                Right Ear: External ear normal.                 Left Ear: External ear normal.                Eyes: . Pupils are equal, round, and reactive to light. Conjunctivae and EOM are normal               Nose: without d/c or deformity               Neck: Neck supple. Gross normal ROM               Cardiovascular: Normal rate and regular rhythm.                 Pulmonary/Chest: Effort normal and breath sounds without rales or wheezing.                Abd:  Soft, NT, ND, + BS, no organomegaly               Neurological: Pt is alert. At baseline orientation, motor grossly intact               Skin: Skin is warm. No rashes, no  other new lesions, LE edema - none               Psychiatric: Pt behavior is normal without agitation   Micro: none  Cardiac tracings I have personally interpreted today:  none  Pertinent Radiological findings (summarize): none   Lab Results  Component Value Date   WBC 6.2 07/13/2020   HGB 13.1 (L) 07/13/2020   HCT 39.2 07/13/2020   PLT 208 07/13/2020   GLUCOSE 108 (H) 07/13/2020   CHOL 160 07/13/2020   TRIG  106 07/13/2020   HDL 46 07/13/2020   LDLCALC 94 07/13/2020   ALT 14 07/13/2020   AST 24 07/13/2020   NA 132 (L) 07/13/2020   K 4.7 07/13/2020   CL 98 07/13/2020   CREATININE 1.43 (H) 07/13/2020   BUN 17 07/13/2020   CO2 29 07/13/2020   TSH 3.34 07/13/2020   PSA 3.04 07/07/2014   HGBA1C 6.5 (H) 07/13/2020   MICROALBUR 0.2 07/13/2020   Assessment/Plan:  Joseph Rocha is a 86 y.o. Black or African American [2] male with  has a past medical history of Allergy, Anemia, Diabetes mellitus, Elevated PSA, Hyperlipidemia, Hypertension, Hypogonadism male, OA (osteoarthritis), and Pituitary abnormality ([place]) (2004).  Acute ear pain, bilateral New onset x 5 days, cant r/o infectious, Mild to mod, for antibx course,  to f/u any worsening symptoms or concerns  Diabetes ([place]) Lab Results  Component Value Date   HGBA1C 6.5 (H) 07/13/2020   Stable, pt to continue current medical treatment glucophage   Hypothyroidism Lab Results  Component Value Date   TSH 3.34 07/13/2020   Stable, pt to continue levothyroxine   Vitamin D intoxication Last vitamin D Lab Results  Component Value Date   VD25OH 47 07/13/2020   Stable, cont oral replacement  Followup: Return if symptoms worsen or fail to improve.  Cathlean Cower, MD 12/31/2020 3:47 AM [facility]

## 2020-12-31 NOTE — Assessment & Plan Note (Signed)
New onset x 5 days, cant r/o infectious, Mild to mod, for antibx course,  to f/u any worsening symptoms or concerns

## 2020-12-31 NOTE — Assessment & Plan Note (Signed)
Lab Results  Component Value Date   HGBA1C 6.5 (H) 07/13/2020   Stable, pt to continue current medical treatment glucophage

## 2021-01-09 ENCOUNTER — Ambulatory Visit: Payer: Medicare Other | Admitting: Internal Medicine

## 2021-01-16 ENCOUNTER — Encounter: Payer: Self-pay | Admitting: Internal Medicine

## 2021-01-16 ENCOUNTER — Ambulatory Visit (INDEPENDENT_AMBULATORY_CARE_PROVIDER_SITE_OTHER): Payer: Medicare Other

## 2021-01-16 ENCOUNTER — Other Ambulatory Visit: Payer: Self-pay

## 2021-01-16 ENCOUNTER — Ambulatory Visit (INDEPENDENT_AMBULATORY_CARE_PROVIDER_SITE_OTHER): Payer: Medicare Other | Admitting: Internal Medicine

## 2021-01-16 VITALS — BP 122/80 | HR 63 | Temp 98.0°F | Ht 75.0 in | Wt 215.0 lb

## 2021-01-16 VITALS — BP 122/80 | HR 63 | Temp 98.0°F | Ht 75.0 in | Wt 215.2 lb

## 2021-01-16 DIAGNOSIS — E559 Vitamin D deficiency, unspecified: Secondary | ICD-10-CM | POA: Diagnosis not present

## 2021-01-16 DIAGNOSIS — E1165 Type 2 diabetes mellitus with hyperglycemia: Secondary | ICD-10-CM

## 2021-01-16 DIAGNOSIS — E7800 Pure hypercholesterolemia, unspecified: Secondary | ICD-10-CM | POA: Diagnosis not present

## 2021-01-16 DIAGNOSIS — Z0000 Encounter for general adult medical examination without abnormal findings: Secondary | ICD-10-CM

## 2021-01-16 DIAGNOSIS — E673 Hypervitaminosis D: Secondary | ICD-10-CM | POA: Diagnosis not present

## 2021-01-16 DIAGNOSIS — N289 Disorder of kidney and ureter, unspecified: Secondary | ICD-10-CM | POA: Diagnosis not present

## 2021-01-16 DIAGNOSIS — E039 Hypothyroidism, unspecified: Secondary | ICD-10-CM

## 2021-01-16 LAB — BASIC METABOLIC PANEL
BUN: 19 mg/dL (ref 6–23)
CO2: 31 mEq/L (ref 19–32)
Calcium: 9.7 mg/dL (ref 8.4–10.5)
Chloride: 100 mEq/L (ref 96–112)
Creatinine, Ser: 1.2 mg/dL (ref 0.40–1.50)
GFR: 55.75 mL/min — ABNORMAL LOW (ref >60.00)
Glucose, Bld: 91 mg/dL (ref 70–99)
Potassium: 4.2 mEq/L (ref 3.5–5.1)
Sodium: 135 mEq/L (ref 135–145)

## 2021-01-16 LAB — URINALYSIS, ROUTINE W REFLEX MICROSCOPIC
Bilirubin Urine: NEGATIVE
Hgb urine dipstick: NEGATIVE
Ketones, ur: NEGATIVE
Leukocytes,Ua: NEGATIVE
Nitrite: NEGATIVE
RBC / HPF: NONE SEEN (ref 0–?)
Specific Gravity, Urine: 1.015 (ref 1.000–1.030)
Total Protein, Urine: NEGATIVE
Urine Glucose: NEGATIVE
Urobilinogen, UA: 1 (ref 0.0–1.0)
WBC, UA: NONE SEEN (ref 0–?)
pH: 7 (ref 5.0–8.0)

## 2021-01-16 LAB — CBC WITH DIFFERENTIAL/PLATELET
Basophils Absolute: 0 10*3/uL (ref 0.0–0.1)
Basophils Relative: 0.8 % (ref 0.0–3.0)
Eosinophils Absolute: 0.2 10*3/uL (ref 0.0–0.7)
Eosinophils Relative: 4.1 % (ref 0.0–5.0)
HCT: 38.1 % — ABNORMAL LOW (ref 39.0–52.0)
Hemoglobin: 13.1 g/dL (ref 13.0–17.0)
Lymphocytes Relative: 36.2 % (ref 12.0–46.0)
Lymphs Abs: 2.1 10*3/uL (ref 0.7–4.0)
MCHC: 34.3 g/dL (ref 30.0–36.0)
MCV: 89.9 fl (ref 78.0–100.0)
Monocytes Absolute: 0.7 10*3/uL (ref 0.1–1.0)
Monocytes Relative: 11.9 % (ref 3.0–12.0)
Neutro Abs: 2.7 10*3/uL (ref 1.4–7.7)
Neutrophils Relative %: 47 % (ref 43.0–77.0)
Platelets: 201 10*3/uL (ref 150.0–400.0)
RBC: 4.24 Mil/uL (ref 4.22–5.81)
RDW: 13.6 % (ref 11.5–15.5)
WBC: 5.7 10*3/uL (ref 4.0–10.5)

## 2021-01-16 LAB — HEPATIC FUNCTION PANEL
ALT: 16 U/L (ref 0–53)
AST: 20 U/L (ref 0–37)
Albumin: 3.8 g/dL (ref 3.5–5.2)
Alkaline Phosphatase: 47 U/L (ref 39–117)
Bilirubin, Direct: 0.1 mg/dL (ref 0.0–0.3)
Total Bilirubin: 0.4 mg/dL (ref 0.2–1.2)
Total Protein: 6.9 g/dL (ref 6.0–8.3)

## 2021-01-16 LAB — TSH: TSH: 2.65 u[IU]/mL (ref 0.35–4.50)

## 2021-01-16 LAB — MICROALBUMIN / CREATININE URINE RATIO
Creatinine,U: 119.5 mg/dL
Microalb Creat Ratio: 0.6 mg/g (ref 0.0–30.0)
Microalb, Ur: <0.7 mg/dL (ref 0.0–1.9)

## 2021-01-16 LAB — HEMOGLOBIN A1C: Hgb A1c MFr Bld: 6.7 % — ABNORMAL HIGH (ref 4.6–6.5)

## 2021-01-16 LAB — LIPID PANEL
Cholesterol: 152 mg/dL (ref 0–200)
HDL: 41.1 mg/dL (ref >39.00)
LDL Cholesterol: 87 mg/dL (ref 0–99)
NonHDL: 110.7
Total CHOL/HDL Ratio: 4
Triglycerides: 119 mg/dL (ref 0.0–149.0)
VLDL: 23.8 mg/dL (ref 0.0–40.0)

## 2021-01-16 LAB — VITAMIN D 25 HYDROXY (VIT D DEFICIENCY, FRACTURES): VITD: 65.87 ng/mL (ref 30.00–100.00)

## 2021-01-16 MED ORDER — DIPHENOXYLATE-ATROPINE 2.5-0.025 MG PO TABS: 1.0000 | ORAL_TABLET | Freq: Four times a day (QID) | ORAL | 1 refills | Status: DC | PRN

## 2021-01-16 NOTE — Patient Instructions (Signed)
[name] , Thank you for taking time to come for your Medicare Wellness Visit. I appreciate your ongoing commitment to your health goals. Please review the following plan we discussed and let me know if I can assist you in the future.   Screening recommendations/referrals: Colonoscopy: not a candidate for colon cancer screening due to age Recommended yearly ophthalmology/optometry visit for glaucoma screening and checkup Recommended yearly dental visit for hygiene and checkup  Vaccinations: Influenza vaccine: 11/17/2020 Pneumococcal vaccine: 09/01/2008, 07/07/2014 Tdap vaccine: 07/07/2014 Shingles vaccine: never done   Covid-19: 11/16/2019, 12/07/2019, 11/17/2020  Advanced directives: Please bring a copy of your health care power of attorney and living will to the office at your convenience.  Conditions/risks identified: Yes; Reviewed health maintenance screenings with patient today and relevant education, vaccines, and/or referrals were provided. Please continue to do your personal lifestyle choices by: daily care of teeth and gums, regular physical activity (goal should be 5 days a week for 30 minutes), eat a healthy diet, avoid tobacco and drug use, limiting any alcohol intake, taking a low-dose aspirin (if not allergic or have been advised by your provider otherwise) and taking vitamins and minerals as recommended by your provider. Continue doing brain stimulating activities (puzzles, reading, adult coloring books, staying active) to keep memory sharp. Continue to eat heart healthy diet (full of fruits, vegetables, whole grains, lean protein, water--limit salt, fat, and sugar intake) and increase physical activity as tolerated.  Next appointment: Please schedule your next Medicare Wellness Visit with your Nurse Health Advisor in 1 year by calling [phone].   Preventive Care 32 Years and Older, Male Preventive care refers to lifestyle choices and visits with your health care provider that can  promote health and wellness. What does preventive care include?  A yearly physical exam. This is also called an annual well check.  Dental exams once or twice a year.  Routine eye exams. Ask your health care provider how often you should have your eyes checked.  Personal lifestyle choices, including:  Daily care of your teeth and gums.  Regular physical activity.  Eating a healthy diet.  Avoiding tobacco and drug use.  Limiting alcohol use.  Practicing safe sex.  Taking low doses of aspirin every day.  Taking vitamin and mineral supplements as recommended by your health care provider. What happens during an annual well check? The services and screenings done by your health care provider during your annual well check will depend on your age, overall health, lifestyle risk factors, and family history of disease. Counseling  Your health care provider may ask you questions about your:  Alcohol use.  Tobacco use.  Drug use.  Emotional well-being.  Home and relationship well-being.  Sexual activity.  Eating habits.  History of falls.  Memory and ability to understand (cognition).  Work and work Statistician. Screening  You may have the following tests or measurements:  Height, weight, and BMI.  Blood pressure.  Lipid and cholesterol levels. These may be checked every 5 years, or more frequently if you are over 62 years old.  Skin check.  Lung cancer screening. You may have this screening every year starting at age 30 if you have a 30-pack-year history of smoking and currently smoke or have quit within the past 15 years.  Fecal occult blood test (FOBT) of the stool. You may have this test every year starting at age 23.  Flexible sigmoidoscopy or colonoscopy. You may have a sigmoidoscopy every 5 years or a colonoscopy every 10  years starting at age 49.  Prostate cancer screening. Recommendations will vary depending on your family history and other  risks.  Hepatitis C blood test.  Hepatitis B blood test.  Sexually transmitted disease (STD) testing.  Diabetes screening. This is done by checking your blood sugar (glucose) after you have not eaten for a while (fasting). You may have this done every 1-3 years.  Abdominal aortic aneurysm (AAA) screening. You may need this if you are a current or former smoker.  Osteoporosis. You may be screened starting at age 33 if you are at high risk. Talk with your health care provider about your test results, treatment options, and if necessary, the need for more tests. Vaccines  Your health care provider may recommend certain vaccines, such as:  Influenza vaccine. This is recommended every year.  Tetanus, diphtheria, and acellular pertussis (Tdap, Td) vaccine. You may need a Td booster every 10 years.  Zoster vaccine. You may need this after age 56.  Pneumococcal 13-valent conjugate (PCV13) vaccine. One dose is recommended after age 9.  Pneumococcal polysaccharide (PPSV23) vaccine. One dose is recommended after age 49. Talk to your health care provider about which screenings and vaccines you need and how often you need them. This information is not intended to replace advice given to you by your health care provider. Make sure you discuss any questions you have with your health care provider. Document Released: 11/18/2015 Document Revised: 07/11/2016 Document Reviewed: 08/23/2015 Elsevier Interactive Patient Education  2017 [employer] Prevention in the Home Falls can cause injuries. They can happen to people of all ages. There are many things you can do to make your home safe and to help prevent falls. What can I do on the outside of my home?  Regularly fix the edges of walkways and driveways and fix any cracks.  Remove anything that might make you trip as you walk through a door, such as a raised step or threshold.  Trim any bushes or trees on the path to your home.  Use  bright outdoor lighting.  Clear any walking paths of anything that might make someone trip, such as rocks or tools.  Regularly check to see if handrails are loose or broken. Make sure that both sides of any steps have handrails.  Any raised decks and porches should have guardrails on the edges.  Have any leaves, snow, or ice cleared regularly.  Use sand or salt on walking paths during winter.  Clean up any spills in your garage right away. This includes oil or grease spills. What can I do in the bathroom?  Use night lights.  Install grab bars by the toilet and in the tub and shower. Do not use towel bars as grab bars.  Use non-skid mats or decals in the tub or shower.  If you need to sit down in the shower, use a plastic, non-slip stool.  Keep the floor dry. Clean up any water that spills on the floor as soon as it happens.  Remove soap buildup in the tub or shower regularly.  Attach bath mats securely with double-sided non-slip rug tape.  Do not have throw rugs and other things on the floor that can make you trip. What can I do in the bedroom?  Use night lights.  Make sure that you have a light by your bed that is easy to reach.  Do not use any sheets or blankets that are too big for your bed. They should not hang  down onto the floor.  Have a firm chair that has side arms. You can use this for support while you get dressed.  Do not have throw rugs and other things on the floor that can make you trip. What can I do in the kitchen?  Clean up any spills right away.  Avoid walking on wet floors.  Keep items that you use a lot in easy-to-reach places.  If you need to reach something above you, use a strong step stool that has a grab bar.  Keep electrical cords out of the way.  Do not use floor polish or wax that makes floors slippery. If you must use wax, use non-skid floor wax.  Do not have throw rugs and other things on the floor that can make you trip. What can  I do with my stairs?  Do not leave any items on the stairs.  Make sure that there are handrails on both sides of the stairs and use them. Fix handrails that are broken or loose. Make sure that handrails are as long as the stairways.  Check any carpeting to make sure that it is firmly attached to the stairs. Fix any carpet that is loose or worn.  Avoid having throw rugs at the top or bottom of the stairs. If you do have throw rugs, attach them to the floor with carpet tape.  Make sure that you have a light switch at the top of the stairs and the bottom of the stairs. If you do not have them, ask someone to add them for you. What else can I do to help prevent falls?  Wear shoes that:  Do not have high heels.  Have rubber bottoms.  Are comfortable and fit you well.  Are closed at the toe. Do not wear sandals.  If you use a stepladder:  Make sure that it is fully opened. Do not climb a closed stepladder.  Make sure that both sides of the stepladder are locked into place.  Ask someone to hold it for you, if possible.  Clearly mark and make sure that you can see:  Any grab bars or handrails.  First and last steps.  Where the edge of each step is.  Use tools that help you move around (mobility aids) if they are needed. These include:  Canes.  Walkers.  Scooters.  Crutches.  Turn on the lights when you go into a dark area. Replace any light bulbs as soon as they burn out.  Set up your furniture so you have a clear path. Avoid moving your furniture around.  If any of your floors are uneven, fix them.  If there are any pets around you, be aware of where they are.  Review your medicines with your doctor. Some medicines can make you feel dizzy. This can increase your chance of falling. Ask your doctor what other things that you can do to help prevent falls. This information is not intended to replace advice given to you by your health care provider. Make sure you  discuss any questions you have with your health care provider. Document Released: 08/18/2009 Document Revised: 03/29/2016 Document Reviewed: 11/26/2014 Elsevier Interactive Patient Education  2017 [employer].

## 2021-01-16 NOTE — Assessment & Plan Note (Signed)
Last vitamin D Lab Results  Component Value Date   VD25OH 65.87 01/16/2021   Stable, cont oral replacement

## 2021-01-16 NOTE — Progress Notes (Signed)
Patient ID: Joseph Rocha, male   DOB: [birth year], 86 y.o.   MRN: [id number]         Chief Complaint:: yearly exam       HPI:  Joseph Rocha is a 86 y.o. male here for yearly exam; plans to see optho soon., o/w up to date with preventive referrals and immunizations.  Has a cruise trip coming up.  C/o bilateral knee pain without sweling or giveaways, mild to mod worsening in the past yr  ALso has left tmj clicking and discomfort mild intermitent for several months, has already seen dental who let him know no dental issue.  Does also have ongoing intermittent diarrhea clear watery without fever, pain, blood and ongoing for > 1 yr, asks for lomotil with the trip coming up.  Pt denies chest pain, increased sob or doe, wheezing, orthopnea, PND, increased LE swelling, palpitations, dizziness or syncope.   Pt denies polydipsia, polyuria, Denies new worsening focal neuro s/s.   Pt denies fever, wt loss, night sweats, loss of appetite, or other constitutional symptoms    Wt Readings from Last 3 Encounters:  01/16/21 215 lb (97.5 kg)  01/16/21 215 lb 3.2 oz (97.6 kg)  12/30/20 215 lb (97.5 kg)   BP Readings from Last 3 Encounters:  01/16/21 122/80  01/16/21 122/80  12/30/20 122/74   Immunization History  Administered Date(s) Administered  . Influenza, High Dose Seasonal PF 08/16/2017  . Influenza,inj,Quad PF,6+ Mos 09/07/2013, 07/07/2014, 08/15/2016, 08/02/2018, 08/29/2019  . PFIZER(Purple Top)SARS-COV-2 Vaccination 11/16/2019, 12/07/2019  . Pneumococcal Conjugate-13 07/07/2014  . Pneumococcal Polysaccharide-23 09/01/2008  . Tdap 07/07/2014   There are no preventive care reminders to display for this patient.    Past Medical History:  Diagnosis Date  . Allergy   . Anemia   . Diabetes mellitus   . Elevated PSA   . Hyperlipidemia   . Hypertension   . Hypogonadism male   . OA (osteoarthritis)   . 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  DOPPLER ECHOCARDIOGRAPHY  12/04/2001  . ELECTROCARDIOGRAM  03/25/2006  . EP IMPLANTABLE DEVICE N/A 03/16/2016   Procedure: Loop Recorder Insertion;  Surgeon: Deboraha Sprang, MD;  Location: [facility];  Service: Cardiovascular;  Laterality: N/A;  . KNEE ARTHROSCOPY Left   . LUMBAR DISC SURGERY    . MENISCUS REPAIR Right may 2015  . TONSILLECTOMY    . TRANSPHENOIDAL / TRANSNASAL HYPOPHYSECTOMY / RESECTION PITUITARY TUMOR  2005    reports that he has never smoked. He has never used smokeless tobacco. He reports that he does not drink alcohol and does not use drugs. family history includes Cancer in his brother; Diabetes Mellitus II in his mother; Healthy in his daughter; Heart attack in his father. Allergies  Allergen Reactions  . Lipitor [Atorvastatin] Other (See Comments)    Myalgias, cramps in hand   Current Outpatient Medications on File Prior to Visit  Medication Sig Dispense Refill  . aspirin EC 81 MG tablet Take 1 tablet (81 mg total) by mouth daily. Swallow whole. 30 tablet 11  . azithromycin (ZITHROMAX) 250 MG tablet 2 tab by mouth day 1, then 1 tab per day 6 tablet 1  . calcium carbonate (OS-CAL) 600 MG TABS Take 600 mg by mouth daily.    . cetirizine-pseudoephedrine (ZYRTEC-D) 5-120 MG tablet Take 1 tablet by mouth daily. 90 tablet 2  . finasteride (PROSCAR) 5 MG tablet TAKE 1 TABLET DAILY 90 tablet  1  . fludrocortisone (FLORINEF) 0.1 MG tablet Take 1 tablet (0.1 mg total) by mouth daily. 90 tablet 2  . folic acid (FOLVITE) 1 MG tablet TAKE 1 TABLET DAILY 90 tablet 2  . IRON PO Take by mouth.    Marland Kitchen KLOR-CON M20 20 MEQ tablet Take 20 mEq by mouth daily.     Marland Kitchen levothyroxine (SYNTHROID) 50 MCG tablet Take 1 tablet (50 mcg total) by mouth daily. 90 tablet 3  . loperamide (IMODIUM A-D) 2 MG tablet Take 1 tablet (2 mg total) by mouth 2 (two) times daily as needed for diarrhea or loose stools. 30 tablet 1  . loperamide (IMODIUM A-D) 2 MG tablet     . meclizine (ANTIVERT) 12.5 MG  tablet Take 1 tablet (12.5 mg total) by mouth 3 (three) times daily as needed for dizziness. 30 tablet 1  . metFORMIN (GLUCOPHAGE) 500 MG tablet TAKE 1 TABLET TWICE DAILY  WITH MEALS 180 tablet 3  . metFORMIN (GLUCOPHAGE-XR) 500 MG 24 hr tablet Take 2 tablets (1,000 mg total) by mouth daily with breakfast. 180 tablet 3  . methocarbamol (ROBAXIN) 500 MG tablet 1 tab by mouth at bedtime as needed 90 tablet 1  . Multiple Vitamins-Minerals (CENTRUM SILVER PO) Take 1 tablet by mouth daily.    . Omega-3 Fatty Acids (FISH OIL) 1000 MG CAPS Take 1 capsule by mouth daily.    Marland Kitchen omeprazole (PRILOSEC) 40 MG capsule TAKE 1 CAPSULE DAILY 90 capsule 1  . repaglinide (PRANDIN) 1 MG tablet Take 1 tablet (1 mg total) by mouth 3 (three) times daily before meals. 270 tablet 3  . rosuvastatin (CRESTOR) 5 MG tablet TAKE 1 TABLET DAILY 90 tablet 2  . Tamsulosin HCl (FLOMAX) 0.4 MG CAPS Take 0.4 mg by mouth daily.    . Testosterone 30 MG/ACT SOLN Place 2 Pump onto the skin daily.    . traMADol (ULTRAM) 50 MG tablet Take 1 tablet (50 mg total) by mouth every 6 (six) hours as needed. 15 tablet 0  . EPINEPHrine 0.3 mg/0.3 mL IJ SOAJ injection Inject 0.3 mg into the muscle once as needed (allergic reaction). (Patient not taking: Reported on 01/16/2021)  0  . fluticasone (FLONASE) 50 MCG/ACT nasal spray Place 2 sprays into both nostrils daily. (Patient not taking: Reported on 01/16/2021) 16 g 6  . predniSONE (DELTASONE) 10 MG tablet 3 tabs by mouth per day for 3 days,2tabs per day for 3 days,1tab per day for 3 days (Patient not taking: Reported on 01/16/2021) 18 tablet 0  . triamcinolone (NASACORT) 55 MCG/ACT AERO nasal inhaler Place 2 sprays into the nose daily. (Patient not taking: Reported on 01/16/2021) 1 each 12   No current facility-administered medications on file prior to visit.        ROS:  All others reviewed and negative.  Objective        PE:  BP 122/80   Pulse 63   Temp 98 F (36.7 C) (Oral)   Ht 6\' 3"  (1.905  m)   Wt 215 lb (97.5 kg)   SpO2 (!) 63%   BMI 26.87 kg/m                 Constitutional: Pt appears in NAD               HENT: Head: NCAT.                Right Ear: External ear normal.  Left Ear: External ear normal.                Eyes: . Pupils are equal, round, and reactive to light. Conjunctivae and EOM are normal               Nose: without d/c or deformity               Neck: Neck supple. Gross normal ROM               Cardiovascular: Normal rate and regular rhythm.                 Pulmonary/Chest: Effort normal and breath sounds without rales or wheezing.                Abd:  Soft, NT, ND, + BS, no organomegaly               Neurological: Pt is alert. At baseline orientation, motor grossly intact               Skin: Skin is warm. No rashes, no other new lesions, LE edema - none               Psychiatric: Pt behavior is normal without agitation   Micro: none  Cardiac tracings I have personally interpreted today:  none  Pertinent Radiological findings (summarize): none   Lab Results  Component Value Date   WBC 5.7 01/16/2021   HGB 13.1 01/16/2021   HCT 38.1 (L) 01/16/2021   PLT 201.0 01/16/2021   GLUCOSE 91 01/16/2021   CHOL 152 01/16/2021   TRIG 119.0 01/16/2021   HDL 41.10 01/16/2021   LDLCALC 87 01/16/2021   ALT 16 01/16/2021   AST 20 01/16/2021   NA 135 01/16/2021   K 4.2 01/16/2021   CL 100 01/16/2021   CREATININE 1.20 01/16/2021   BUN 19 01/16/2021   CO2 31 01/16/2021   TSH 2.65 01/16/2021   PSA 3.04 07/07/2014   HGBA1C 6.7 (H) 01/16/2021   MICROALBUR <0.7 01/16/2021   Assessment/Plan:  Joseph Rocha is a 86 y.o. Black or African American [2] male with  has a past medical history of Allergy, Anemia, Diabetes mellitus, Elevated PSA, Hyperlipidemia, Hypertension, Hypogonadism male, OA (osteoarthritis), and Pituitary abnormality ([place]) (2004).  Renal insufficiency ? ckd - for f/u lab today  Diabetes [facility]) Lab Results  Component Value  Date   HGBA1C 6.7 (H) 01/16/2021   Stable, pt to continue current medical treatment metformiin, prandin   HYPERCHOLESTEROLEMIA Lab Results  Component Value Date   [place] 87 01/16/2021   Stable, pt to continue current statin crestor 5   Hypothyroidism Lab Results  Component Value Date   TSH 2.65 01/16/2021   Stable, pt to continue levothyroxine   Vitamin D deficiency Last vitamin D Lab Results  Component Value Date   VD25OH 65.87 01/16/2021   Stable, cont oral replacement   Followup: Return in about 6 months (around 07/19/2021).  Cathlean Cower, MD 01/16/2021 9:25 PM [facility]

## 2021-01-16 NOTE — Assessment & Plan Note (Signed)
Lab Results  Component Value Date   LDLCALC 87 01/16/2021   Stable, pt to continue current statin crestor 5

## 2021-01-16 NOTE — Assessment & Plan Note (Signed)
?   ckd - for f/u lab today

## 2021-01-16 NOTE — Progress Notes (Signed)
Subjective:   Joseph Rocha is a 86 y.o. male who presents for Medicare Annual/Subsequent preventive examination.  Review of Systems    No ROS. Medicare Wellness Visit. Additional risk factors are reflected in social history. Cardiac Risk Factors include: advanced age (>7men, >50 women);diabetes mellitus;dyslipidemia;family history of premature cardiovascular disease;male gender     Objective:    Today's Vitals   01/16/21 1320  BP: 122/80  Pulse: 63  Temp: 98 F (36.7 C)  SpO2: 97%  Weight: 215 lb 3.2 oz (97.6 kg)  Height: 6\' 3"  (1.905 m)  PainSc: 0-No pain   Body mass index is 26.9 kg/m.  Advanced Directives 01/16/2021 10/19/2018 08/16/2017 04/22/2017 03/16/2016 07/27/2015  Does Patient Have a Medical Advance Directive? No Yes No No Yes No  Type of Advance Directive - [place]  Does patient want to make changes to medical advance directive? - - - - No - Patient declined -  Would patient like information on creating a medical advance directive? Yes (MAU/Ambulatory/Procedural Areas - Information given) - - - - -    Current Medications (verified) Outpatient Encounter Medications as of 01/16/2021  Medication Sig  . fludrocortisone (FLORINEF) 0.1 MG tablet Take 1 tablet (0.1 mg total) by mouth daily.  Marland Kitchen aspirin EC 81 MG tablet Take 1 tablet (81 mg total) by mouth daily. Swallow whole.  Marland Kitchen azithromycin (ZITHROMAX) 250 MG tablet 2 tab by mouth day 1, then 1 tab per day  . calcium carbonate (OS-CAL) 600 MG TABS Take 600 mg by mouth daily.  . cetirizine-pseudoephedrine (ZYRTEC-D) 5-120 MG tablet Take 1 tablet by mouth daily.  Marland Kitchen EPINEPHrine 0.3 mg/0.3 mL IJ SOAJ injection Inject 0.3 mg into the muscle once as needed (allergic reaction). (Patient not taking: Reported on 01/16/2021)  . finasteride (PROSCAR) 5 MG tablet TAKE 1 TABLET DAILY  . fluticasone (FLONASE) 50 MCG/ACT nasal spray Place 2 sprays into both nostrils daily. (Patient not taking: Reported  on 01/16/2021)  . folic acid (FOLVITE) 1 MG tablet TAKE 1 TABLET DAILY  . IRON PO Take by mouth.  Marland Kitchen KLOR-CON M20 20 MEQ tablet Take 20 mEq by mouth daily.   Marland Kitchen levothyroxine (SYNTHROID) 50 MCG tablet Take 1 tablet (50 mcg total) by mouth daily.  Marland Kitchen loperamide (IMODIUM A-D) 2 MG tablet Take 1 tablet (2 mg total) by mouth 2 (two) times daily as needed for diarrhea or loose stools.  Marland Kitchen loperamide (IMODIUM A-D) 2 MG tablet   . meclizine (ANTIVERT) 12.5 MG tablet Take 1 tablet (12.5 mg total) by mouth 3 (three) times daily as needed for dizziness.  . metFORMIN (GLUCOPHAGE) 500 MG tablet TAKE 1 TABLET TWICE DAILY  WITH MEALS  . metFORMIN (GLUCOPHAGE-XR) 500 MG 24 hr tablet Take 2 tablets (1,000 mg total) by mouth daily with breakfast.  . methocarbamol (ROBAXIN) 500 MG tablet 1 tab by mouth at bedtime as needed  . Multiple Vitamins-Minerals (CENTRUM SILVER PO) Take 1 tablet by mouth daily.  . Omega-3 Fatty Acids (FISH OIL) 1000 MG CAPS Take 1 capsule by mouth daily.  Marland Kitchen omeprazole (PRILOSEC) 40 MG capsule TAKE 1 CAPSULE DAILY  . predniSONE (DELTASONE) 10 MG tablet 3 tabs by mouth per day for 3 days,2tabs per day for 3 days,1tab per day for 3 days (Patient not taking: Reported on 01/16/2021)  . repaglinide (PRANDIN) 1 MG tablet Take 1 tablet (1 mg total) by mouth 3 (three) times daily before meals.  . rosuvastatin (CRESTOR) 5 MG  tablet TAKE 1 TABLET DAILY  . Tamsulosin HCl (FLOMAX) 0.4 MG CAPS Take 0.4 mg by mouth daily.  . Testosterone 30 MG/ACT SOLN Place 2 Pump onto the skin daily.  . traMADol (ULTRAM) 50 MG tablet Take 1 tablet (50 mg total) by mouth every 6 (six) hours as needed.  . triamcinolone (NASACORT) 55 MCG/ACT AERO nasal inhaler Place 2 sprays into the nose daily. (Patient not taking: Reported on 01/16/2021)   No facility-administered encounter medications on file as of 01/16/2021.    Allergies (verified) Lipitor [atorvastatin]   History: Past Medical History:  Diagnosis Date  . Allergy    . Anemia   . Diabetes mellitus   . Elevated PSA   . Hyperlipidemia   . Hypertension   . Hypogonadism male   . OA (osteoarthritis)   . 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 DOPPLER ECHOCARDIOGRAPHY  12/04/2001  . ELECTROCARDIOGRAM  03/25/2006  . EP IMPLANTABLE DEVICE N/A 03/16/2016   Procedure: Loop Recorder Insertion;  Surgeon: Deboraha Sprang, MD;  Location: [facility];  Service: Cardiovascular;  Laterality: N/A;  . KNEE ARTHROSCOPY Left   . LUMBAR DISC SURGERY    . MENISCUS REPAIR Right may 2015  . TONSILLECTOMY    . TRANSPHENOIDAL / TRANSNASAL HYPOPHYSECTOMY / RESECTION PITUITARY TUMOR  2005   Family History  Problem Relation Age of Onset  . Cancer Brother        uncertain type  . Diabetes Mellitus II Mother        Deceased, 18s  . Heart attack Father   . Healthy Daughter    Social History   Socioeconomic History  . Marital status: Married    Spouse name: Not on file  . Number of children: 2  . Years of education: Not on file  . Highest education level: Not on file  Occupational History  . Occupation: retired    Fish farm manager: RETIRED  Tobacco Use  . Smoking status: Never Smoker  . Smokeless tobacco: Never Used  Vaping Use  . Vaping Use: Never used  Substance and Sexual Activity  . Alcohol use: No  . Drug use: No  . Sexual activity: Not Currently  Other Topics Concern  . Not on file  Social History Narrative   Lives with wife.  They have two grown children.   He is retired from the [employer].   Social Determinants of Health   Financial Resource Strain: Low Risk   . Difficulty of Paying Living Expenses: Not hard at all  Food Insecurity: No Food Insecurity  . Worried About Charity fundraiser in the Last Year: Never true  . Ran Out of Food in the Last Year: Never true  Transportation Needs: No Transportation Needs  . Lack of Transportation (Medical): No  . Lack of Transportation (Non-Medical): No   Physical Activity: Sufficiently Active  . Days of Exercise per Week: 5 days  . Minutes of Exercise per Session: 30 min  Stress: No Stress Concern Present  . Feeling of Stress : Not at all  Social Connections: Socially Integrated  . Frequency of Communication with Friends and Family: More than three times a week  . Frequency of Social Gatherings with Friends and Family: More than three times a week  . Attends Religious Services: More than 4 times per year  . Active Member of Clubs or Organizations: Yes  . Attends Archivist Meetings: More than 4 times per year  .  Marital Status: Married    Tobacco Counseling Counseling given: Not Answered   Clinical Intake:  Pre-visit preparation completed: Yes  Pain : No/denies pain Pain Score: 0-No pain     BMI - recorded: 26.9 Nutritional Status: BMI 25 -29 Overweight Nutritional Risks: None Diabetes: Yes CBG done?: No Did pt. bring in CBG monitor from home?: No  How often do you need to have someone help you when you read instructions, pamphlets, or other written materials from your doctor or pharmacy?: 1 - Never What is the last grade level you completed in school?: [place], [place]  Diabetic? yes  Interpreter Needed?: No  Information entered by :: Lisette Abu, LPN   Activities of Daily Living In your present state of health, do you have any difficulty performing the following activities: 01/16/2021 07/13/2020  Hearing? N N  Vision? N N  Difficulty concentrating or making decisions? N N  Walking or climbing stairs? N Y  Dressing or bathing? N N  Doing errands, shopping? N N  Preparing Food and eating ? N -  Using the Toilet? N -  In the past six months, have you accidently leaked urine? N -  Do you have problems with loss of bowel control? N -  Managing your Medications? N -  Managing your Finances? N -  Housekeeping or managing your Housekeeping? N -  Some recent data might be hidden     Patient Care Team: Biagio Borg, MD as PCP - General (Internal Medicine) Carolan Clines, MD (Inactive) as Attending Physician (Urology) Belva Crome, MD as Attending Physician (Cardiology) Erline Levine, MD as Attending Physician (Neurosurgery) Harold Hedge, Darrick Grinder, MD (Allergy and Immunology)  Indicate any recent Medical Services you may have received from other than [facility] providers in the past year (date may be approximate).     Assessment:   This is a routine wellness examination for Rhonin.  Hearing/Vision screen No exam data present  Dietary issues and exercise activities discussed: Current Exercise Habits: Home exercise routine, Type of exercise: walking, Time (Minutes): 30, Frequency (Times/Week): 5, Weekly Exercise (Minutes/Week): 150, Intensity: Mild, Exercise limited by: Other - see comments (right drop foot)  Goals    .  Patient Stated (pt-stated)      To maintain my current health status by continuing to eat healthy, stay physically active and socially active.      Depression Screen PHQ 2/9 Scores 01/16/2021 07/13/2020  PHQ - 2 Score 0 0    Fall Risk Fall Risk  01/16/2021  Falls in the past year? 0  Number falls in past yr: 0  Injury with Fall? 0  Risk for fall due to : No Fall Risks  Follow up Falls evaluation completed    [place]:  Any stairs in or around the home? Yes  If so, are there any without handrails? No  Home free of loose throw rugs in walkways, pet beds, electrical cords, etc? Yes  Adequate lighting in your home to reduce risk of falls? Yes   ASSISTIVE DEVICES UTILIZED TO PREVENT FALLS:  Life alert? No  Use of a cane, walker or w/c? Yes  Grab bars in the bathroom? Yes  Shower chair or bench in shower? Yes  Elevated toilet seat or a handicapped toilet? Yes   TIMED UP AND GO:  Was the test performed? No .  Length of time to ambulate 10 feet: 0 sec.   Gait steady and fast with assistive  device  Cognitive Function: Normal cognitive status assessed by direct observation by this Nurse Health Advisor. No abnormalities found.          Immunizations Immunization History  Administered Date(s) Administered  . Influenza, High Dose Seasonal PF 08/16/2017  . Influenza,inj,Quad PF,6+ Mos 09/07/2013, 07/07/2014, 08/15/2016, 08/02/2018, 08/29/2019  . PFIZER(Purple Top)SARS-COV-2 Vaccination 11/16/2019, 12/07/2019  . Pneumococcal Conjugate-13 07/07/2014  . Pneumococcal Polysaccharide-23 09/01/2008  . Tdap 07/07/2014    TDAP status: Up to date  Flu Vaccine status: Up to date  Pneumococcal vaccine status: Up to date  Covid-19 vaccine status: Completed vaccines  Qualifies for Shingles Vaccine? Yes   Zostavax completed No   Shingrix Completed?: No.    Education has been provided regarding the importance of this vaccine. Patient has been advised to call insurance company to determine out of pocket expense if they have not yet received this vaccine. Advised may also receive vaccine at local pharmacy or Health Dept. Verbalized acceptance and understanding.  Screening Tests Health Maintenance  Topic Date Due  . OPHTHALMOLOGY EXAM  Never done  . HEMOGLOBIN A1C  01/10/2021  . FOOT EXAM  05/23/2021  . URINE MICROALBUMIN  07/13/2021  . TETANUS/TDAP  07/07/2024  . INFLUENZA VACCINE  Completed  . COVID-19 Vaccine  Completed  . PNA vac Low Risk Adult  Completed  . HPV VACCINES  Aged Out    Health Maintenance  Health Maintenance Due  Topic Date Due  . OPHTHALMOLOGY EXAM  Never done  . HEMOGLOBIN A1C  01/10/2021    Colorectal cancer screening: No longer required.   Lung Cancer Screening: (Low Dose CT Chest recommended if Age 40-80 years, 30 pack-year currently smoking OR have quit w/in 15years.) does not qualify.   Lung Cancer Screening Referral: no  Additional Screening:  Hepatitis C Screening: does not qualify; Completed no  Vision Screening: Recommended annual  ophthalmology exams for early detection of glaucoma and other disorders of the eye. Is the patient up to date with their annual eye exam?  No  Who is the provider or what is the name of the office in which the patient attends annual eye exams? Refused If pt is not established with a provider, would they like to be referred to a provider to establish care? No .   Dental Screening: Recommended annual dental exams for proper oral hygiene  Community Resource Referral / Chronic Care Management: CRR required this visit?  No   CCM required this visit?  No      Plan:     I have personally reviewed and noted the following in the patient's chart:   . Medical and social history . Use of alcohol, tobacco or illicit drugs  . Current medications and supplements . Functional ability and status . Nutritional status . Physical activity . Advanced directives . List of other physicians . Hospitalizations, surgeries, and ER visits in previous 12 months . Vitals . Screenings to include cognitive, depression, and falls . Referrals and appointments  In addition, I have reviewed and discussed with patient certain preventive protocols, quality metrics, and best practice recommendations. A written personalized care plan for preventive services as well as general preventive health recommendations were provided to patient.     Sheral Flow, LPN   0/32/1224   Nurse Notes:  Medications reviewed with patient; no opioid use noted.

## 2021-01-16 NOTE — Assessment & Plan Note (Signed)
Lab Results  Component Value Date   TSH 2.65 01/16/2021   Stable, pt to continue levothyroxine

## 2021-01-16 NOTE — Assessment & Plan Note (Signed)
Lab Results  Component Value Date   HGBA1C 6.7 (H) 01/16/2021   Stable, pt to continue current medical treatment metformiin, prandin

## 2021-01-16 NOTE — Patient Instructions (Signed)
Ok to try the OTC Voltaren gel for the left TMJ clicking and the knee pain  Please take all new medication as prescribed - the lomotil as needed for diarrhea  Please continue all other medications as before, and refills have been done if requested.  Please have the pharmacy call with any other refills you may need.  Please continue your efforts at being more active, low cholesterol diet, and weight control.  You are otherwise up to date with prevention measures today.  Please keep your appointments with your specialists as you may have planned  Please go to the LAB at the blood drawing area for the tests to be done  You will be contacted by phone if any changes need to be made immediately.  Otherwise, you will receive a letter about your results with an explanation, but please check with MyChart first.  Please remember to sign up for MyChart if you have not done so, as this will be important to you in the future with finding out test results, communicating by private email, and scheduling acute appointments online when needed.  Please make an Appointment to return in 6 months, or sooner if needed

## 2021-01-26 DIAGNOSIS — Z20822 Contact with and (suspected) exposure to covid-19: Secondary | ICD-10-CM | POA: Diagnosis not present

## 2021-02-10 ENCOUNTER — Telehealth: Payer: Self-pay | Admitting: Internal Medicine

## 2021-02-10 ENCOUNTER — Other Ambulatory Visit: Payer: Self-pay

## 2021-02-10 MED ORDER — ROSUVASTATIN CALCIUM 5 MG PO TABS: 5.0000 mg | ORAL_TABLET | Freq: Every day | ORAL | 2 refills | Status: DC

## 2021-02-10 NOTE — Telephone Encounter (Signed)
New Message  [facility] calling regarding needing a refill of rosuvastatin (CRESTOR) 5 MG tablet for patient    Pharmacy: [place], [place] to Registered [facility] Sites

## 2021-02-10 NOTE — Telephone Encounter (Signed)
Please advise. This was sent under Dr Kelton Pillar in error.

## 2021-02-10 NOTE — Telephone Encounter (Signed)
Please refill as per office routine med refill policy (all routine meds refilled for 3 mo or monthly per pt preference up to one year from last visit, then month to month grace period for 3 mo, then further med refills will have to be denied)  

## 2021-02-10 NOTE — Telephone Encounter (Signed)
Please forward refill request to pt's primary care provider.   

## 2021-02-23 ENCOUNTER — Other Ambulatory Visit: Payer: Self-pay | Admitting: Endocrinology

## 2021-02-23 DIAGNOSIS — E119 Type 2 diabetes mellitus without complications: Secondary | ICD-10-CM

## 2021-03-24 ENCOUNTER — Encounter: Payer: Self-pay | Admitting: Internal Medicine

## 2021-03-24 ENCOUNTER — Other Ambulatory Visit: Payer: Self-pay | Admitting: Endocrinology

## 2021-03-24 ENCOUNTER — Other Ambulatory Visit: Payer: Self-pay

## 2021-03-24 ENCOUNTER — Ambulatory Visit (INDEPENDENT_AMBULATORY_CARE_PROVIDER_SITE_OTHER): Payer: Medicare Other | Admitting: Internal Medicine

## 2021-03-24 VITALS — BP 118/60 | HR 62 | Temp 98.7°F | Ht 75.0 in | Wt 210.0 lb

## 2021-03-24 DIAGNOSIS — J309 Allergic rhinitis, unspecified: Secondary | ICD-10-CM | POA: Diagnosis not present

## 2021-03-24 DIAGNOSIS — R42 Dizziness and giddiness: Secondary | ICD-10-CM

## 2021-03-24 LAB — CBC WITH DIFFERENTIAL/PLATELET
Basophils Absolute: 0 10*3/uL (ref 0.0–0.1)
Basophils Relative: 0.8 % (ref 0.0–3.0)
Eosinophils Absolute: 0.2 10*3/uL (ref 0.0–0.7)
Eosinophils Relative: 4.1 % (ref 0.0–5.0)
HCT: 40.4 % (ref 39.0–52.0)
Hemoglobin: 13.8 g/dL (ref 13.0–17.0)
Lymphocytes Relative: 32 % (ref 12.0–46.0)
Lymphs Abs: 1.9 10*3/uL (ref 0.7–4.0)
MCHC: 34.2 g/dL (ref 30.0–36.0)
MCV: 89.5 fl (ref 78.0–100.0)
Monocytes Absolute: 0.7 10*3/uL (ref 0.1–1.0)
Monocytes Relative: 12 % (ref 3.0–12.0)
Neutro Abs: 3.1 10*3/uL (ref 1.4–7.7)
Neutrophils Relative %: 51.1 % (ref 43.0–77.0)
Platelets: 206 10*3/uL (ref 150.0–400.0)
RBC: 4.52 Mil/uL (ref 4.22–5.81)
RDW: 13.6 % (ref 11.5–15.5)
WBC: 6.1 10*3/uL (ref 4.0–10.5)

## 2021-03-24 LAB — BASIC METABOLIC PANEL
BUN: 22 mg/dL (ref 6–23)
CO2: 26 mEq/L (ref 19–32)
Calcium: 9.8 mg/dL (ref 8.4–10.5)
Chloride: 99 mEq/L (ref 96–112)
Creatinine, Ser: 1.45 mg/dL (ref 0.40–1.50)
GFR: 44.37 mL/min — ABNORMAL LOW (ref >60.00)
Glucose, Bld: 92 mg/dL (ref 70–99)
Potassium: 3.9 mEq/L (ref 3.5–5.1)
Sodium: 134 mEq/L — ABNORMAL LOW (ref 135–145)

## 2021-03-24 LAB — HEPATIC FUNCTION PANEL
ALT: 16 U/L (ref 0–53)
AST: 22 U/L (ref 0–37)
Albumin: 4.1 g/dL (ref 3.5–5.2)
Alkaline Phosphatase: 58 U/L (ref 39–117)
Bilirubin, Direct: 0.1 mg/dL (ref 0.0–0.3)
Total Bilirubin: 0.6 mg/dL (ref 0.2–1.2)
Total Protein: 7.3 g/dL (ref 6.0–8.3)

## 2021-03-24 MED ORDER — FLUDROCORTISONE ACETATE 0.1 MG PO TABS: 0.1000 mg | ORAL_TABLET | Freq: Every day | ORAL | 2 refills | Status: DC

## 2021-03-24 MED ORDER — MECLIZINE HCL 12.5 MG PO TABS: 12.5000 mg | ORAL_TABLET | Freq: Three times a day (TID) | ORAL | 1 refills | Status: AC | PRN

## 2021-03-24 MED ORDER — METHYLPREDNISOLONE ACETATE 80 MG/ML IJ SUSP
80.0000 mg | Freq: Once | INTRAMUSCULAR | Status: AC
  Administered 2021-03-24: 80 mg via INTRAMUSCULAR

## 2021-03-24 NOTE — Patient Instructions (Signed)
You had the steroid shot today  Please take all new medication as prescribed - the florinef (sent to the mail in pharmacy)  Please continue all other medications as before, including the meclizine (sent to the [facility])  Please have the pharmacy call with any other refills you may need.  Please continue your efforts at being more active, low cholesterol diet, and weight control.  Please keep your appointments with your specialists as you may have planned  Please go to the LAB at the blood drawing area for the tests to be done  You will be contacted by phone if any changes need to be made immediately.  Otherwise, you will receive a letter about your results with an explanation, but please check with MyChart first.  Please remember to sign up for MyChart if you have not done so, as this will be important to you in the future with finding out test results, communicating by private email, and scheduling acute appointments online when needed.

## 2021-03-24 NOTE — Assessment & Plan Note (Signed)
Also for melcizine prn

## 2021-03-24 NOTE — Assessment & Plan Note (Signed)
Due to possible low volume, for bmp, and restart florinef

## 2021-03-24 NOTE — Progress Notes (Signed)
Patient ID: Joseph Rocha, male   DOB: [birth year], 86 y.o.   MRN: [id number]        Chief Complaint: dizziness vertigo, allergies       HPI:  Joseph Rocha is a 86 y.o. male here Does have several wks ongoing nasal allergy symptoms with clearish congestion, itch and sneezing, without fever, pain, ST, cough, swelling or wheezing, but has worsening vertigo on occasion in last week, but also realizes he is out of florinef and has not taken in some months as well, leading to persistent low BP at home as wel.  Pt denies chest pain, increased sob or doe, wheezing, orthopnea, PND, increased LE swelling, palpitations, or syncope.   Pt denies fever, wt loss, night sweats, loss of appetite, or other constitutional symptoms  No other new complaints       Wt Readings from Last 3 Encounters:  03/24/21 210 lb (95.3 kg)  01/16/21 215 lb (97.5 kg)  01/16/21 215 lb 3.2 oz (97.6 kg)   BP Readings from Last 3 Encounters:  03/24/21 118/60  01/16/21 122/80  01/16/21 122/80         Past Medical History:  Diagnosis Date  . Allergy   . Anemia   . Diabetes mellitus   . Elevated PSA   . Hyperlipidemia   . Hypertension   . Hypogonadism male   . OA (osteoarthritis)   . 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 DOPPLER ECHOCARDIOGRAPHY  12/04/2001  . ELECTROCARDIOGRAM  03/25/2006  . EP IMPLANTABLE DEVICE N/A 03/16/2016   Procedure: Loop Recorder Insertion;  Surgeon: Deboraha Sprang, MD;  Location: [name] [facility];  Service: Cardiovascular;  Laterality: N/A;  . KNEE ARTHROSCOPY Left   . LUMBAR DISC SURGERY    . MENISCUS REPAIR Right may 2015  . TONSILLECTOMY    . TRANSPHENOIDAL / TRANSNASAL HYPOPHYSECTOMY / RESECTION PITUITARY TUMOR  2005    reports that he has never smoked. He has never used smokeless tobacco. He reports that he does not drink alcohol and does not use drugs. family history includes Cancer in his brother; Diabetes Mellitus II in his  mother; Healthy in his daughter; Heart attack in his father. Allergies  Allergen Reactions  . Lipitor [Atorvastatin] Other (See Comments)    Myalgias, cramps in hand   Current Outpatient Medications on File Prior to Visit  Medication Sig Dispense Refill  . aspirin EC 81 MG tablet Take 1 tablet (81 mg total) by mouth daily. Swallow whole. 30 tablet 11  . calcium carbonate (OS-CAL) 600 MG TABS Take 600 mg by mouth daily.    . cetirizine-pseudoephedrine (ZYRTEC-D) 5-120 MG tablet Take 1 tablet by mouth daily. 90 tablet 2  . diphenoxylate-atropine (LOMOTIL) 2.5-0.025 MG tablet Take 1 tablet by mouth 4 (four) times daily as needed for diarrhea or loose stools. 30 tablet 1  . EPINEPHrine 0.3 mg/0.3 mL IJ SOAJ injection Inject 0.3 mg into the muscle once as needed (allergic reaction).  0  . finasteride (PROSCAR) 5 MG tablet TAKE 1 TABLET DAILY 90 tablet 1  . fluticasone (FLONASE) 50 MCG/ACT nasal spray Place 2 sprays into both nostrils daily. 16 g 6  . folic acid (FOLVITE) 1 MG tablet TAKE 1 TABLET DAILY 90 tablet 2  . IRON PO Take by mouth.    Marland Kitchen KLOR-CON M20 20 MEQ tablet Take 20 mEq by mouth daily.     Marland Kitchen loperamide (IMODIUM A-D) 2 MG tablet Take  1 tablet (2 mg total) by mouth 2 (two) times daily as needed for diarrhea or loose stools. 30 tablet 1  . loperamide (IMODIUM A-D) 2 MG tablet     . metFORMIN (GLUCOPHAGE) 500 MG tablet TAKE 1 TABLET TWICE DAILY  WITH MEALS 180 tablet 3  . metFORMIN (GLUCOPHAGE-XR) 500 MG 24 hr tablet Take 2 tablets (1,000 mg total) by mouth daily with breakfast. 180 tablet 3  . methocarbamol (ROBAXIN) 500 MG tablet 1 tab by mouth at bedtime as needed 90 tablet 1  . Multiple Vitamins-Minerals (CENTRUM SILVER PO) Take 1 tablet by mouth daily.    . Omega-3 Fatty Acids (FISH OIL) 1000 MG CAPS Take 1 capsule by mouth daily.    Marland Kitchen omeprazole (PRILOSEC) 40 MG capsule TAKE 1 CAPSULE DAILY 90 capsule 1  . predniSONE (DELTASONE) 10 MG tablet 3 tabs by mouth per day for 3  days,2tabs per day for 3 days,1tab per day for 3 days 18 tablet 0  . repaglinide (PRANDIN) 1 MG tablet Take 1 tablet (1 mg total) by mouth 3 (three) times daily before meals. 270 tablet 3  . rosuvastatin (CRESTOR) 5 MG tablet Take 1 tablet (5 mg total) by mouth daily. 90 tablet 2  . SYNTHROID 50 MCG tablet TAKE 1 TABLET DAILY 90 tablet 0  . Tamsulosin HCl (FLOMAX) 0.4 MG CAPS Take 0.4 mg by mouth daily.    . Testosterone 30 MG/ACT SOLN Place 2 Pump onto the skin daily.    . traMADol (ULTRAM) 50 MG tablet Take 1 tablet (50 mg total) by mouth every 6 (six) hours as needed. 15 tablet 0  . triamcinolone (NASACORT) 55 MCG/ACT AERO nasal inhaler Place 2 sprays into the nose daily. 1 each 12   No current facility-administered medications on file prior to visit.        ROS:  All others reviewed and negative.  Objective        PE:  BP 118/60 (BP Location: Right Arm, Patient Position: Sitting, Cuff Size: Large)   Pulse 62   Temp 98.7 F (37.1 C) (Oral)   Ht 6\' 3"  (1.905 m)   Wt 210 lb (95.3 kg)   SpO2 92%   BMI 26.25 kg/m                 Constitutional: Pt appears in NAD               HENT: Head: NCAT.                Right Ear: External ear normal.                 Left Ear: External ear normal.                Eyes: . Pupils are equal, round, and reactive to light. Conjunctivae and EOM are normal; Bilat tm's with mild erythema.  Max sinus areas none tender.  Pharynx with mild erythema, no exudate               Nose: without d/c or deformity               Neck: Neck supple. Gross normal ROM               Cardiovascular: Normal rate and regular rhythm.                 Pulmonary/Chest: Effort normal and breath sounds without rales or wheezing.  Abd:  Soft, NT, ND, + BS, no organomegaly               Neurological: Pt is alert. At baseline orientation, motor grossly intact               Skin: Skin is warm. No rashes, no other new lesions, LE edema - none               Psychiatric:  Pt behavior is normal without agitation   Micro: none  Cardiac tracings I have personally interpreted today:  none  Pertinent Radiological findings (summarize): none   Lab Results  Component Value Date   WBC 6.1 03/24/2021   HGB 13.8 03/24/2021   HCT 40.4 03/24/2021   PLT 206.0 03/24/2021   GLUCOSE 92 03/24/2021   CHOL 152 01/16/2021   TRIG 119.0 01/16/2021   HDL 41.10 01/16/2021   LDLCALC 87 01/16/2021   ALT 16 03/24/2021   AST 22 03/24/2021   NA 134 (L) 03/24/2021   K 3.9 03/24/2021   CL 99 03/24/2021   CREATININE 1.45 03/24/2021   BUN 22 03/24/2021   CO2 26 03/24/2021   TSH 2.65 01/16/2021   PSA 3.04 07/07/2014   HGBA1C 6.7 (H) 01/16/2021   MICROALBUR <0.7 01/16/2021   Assessment/Plan:  Joseph Rocha is a 86 y.o. Black or African American [2] male with  has a past medical history of Allergy, Anemia, Diabetes mellitus, Elevated PSA, Hyperlipidemia, Hypertension, Hypogonadism male, OA (osteoarthritis), and Pituitary abnormality ([place]) (2004).  DIZZINESS Due to possible low volume, for bmp, and restart florinef  Allergic rhinitis Mild seasonal flare, for depomedrol Im 80,  to f/u any worsening symptoms or concerns  Vertigo Also for melcizine prn  Followup: Return in about 3 months (around 06/24/2021).  Cathlean Cower, MD 03/24/2021 10:02 PM [facility]

## 2021-03-24 NOTE — Assessment & Plan Note (Signed)
Mild seasonal flare, for depomedrol Im 80,  to f/u any worsening symptoms or concerns

## 2021-04-01 DIAGNOSIS — Z20822 Contact with and (suspected) exposure to covid-19: Secondary | ICD-10-CM | POA: Diagnosis not present

## 2021-04-04 ENCOUNTER — Other Ambulatory Visit: Payer: Self-pay | Admitting: Internal Medicine

## 2021-04-06 ENCOUNTER — Telehealth: Payer: Self-pay | Admitting: Internal Medicine

## 2021-04-06 MED ORDER — FOLIC ACID 1 MG PO TABS: 1.0000 mg | ORAL_TABLET | Freq: Every day | ORAL | 3 refills | Status: DC

## 2021-04-06 NOTE — Telephone Encounter (Signed)
1.Medication Requested: folic acid (FOLVITE) 1 MG tablet  2. Pharmacy (Name, [place]):   [facility], [place] to Registered [place] Sites Phone:  [phone]  Fax:  [phone]     90 day refill requested  3. On Med List: Y  4. Last Visit with PCP:  5.20.22  5. Next visit date with PCP: Not scheduled yet   Agent: Please be advised that RX refills may take up to 3 business days. We ask that you follow-up with your pharmacy.

## 2021-04-11 ENCOUNTER — Other Ambulatory Visit: Payer: Self-pay | Admitting: Endocrinology

## 2021-04-13 DIAGNOSIS — Z20822 Contact with and (suspected) exposure to covid-19: Secondary | ICD-10-CM | POA: Diagnosis not present

## 2021-04-17 ENCOUNTER — Other Ambulatory Visit: Payer: Self-pay | Admitting: Endocrinology

## 2021-04-22 ENCOUNTER — Other Ambulatory Visit: Payer: Self-pay | Admitting: Endocrinology

## 2021-04-22 DIAGNOSIS — E119 Type 2 diabetes mellitus without complications: Secondary | ICD-10-CM

## 2021-04-24 ENCOUNTER — Ambulatory Visit (INDEPENDENT_AMBULATORY_CARE_PROVIDER_SITE_OTHER): Payer: Medicare Other | Admitting: Otolaryngology

## 2021-05-30 ENCOUNTER — Ambulatory Visit (INDEPENDENT_AMBULATORY_CARE_PROVIDER_SITE_OTHER): Payer: Medicare Other | Admitting: Otolaryngology

## 2021-05-31 DIAGNOSIS — Z20822 Contact with and (suspected) exposure to covid-19: Secondary | ICD-10-CM | POA: Diagnosis not present

## 2021-06-14 DIAGNOSIS — Z20822 Contact with and (suspected) exposure to covid-19: Secondary | ICD-10-CM | POA: Diagnosis not present

## 2021-06-15 ENCOUNTER — Telehealth (INDEPENDENT_AMBULATORY_CARE_PROVIDER_SITE_OTHER): Payer: Medicare Other | Admitting: Internal Medicine

## 2021-06-15 DIAGNOSIS — E1165 Type 2 diabetes mellitus with hyperglycemia: Secondary | ICD-10-CM | POA: Diagnosis not present

## 2021-06-15 MED ORDER — AZITHROMYCIN 250 MG PO TABS: ORAL_TABLET | ORAL | 0 refills | Status: AC

## 2021-06-15 MED ORDER — PREDNISONE 10 MG PO TABS: ORAL_TABLET | ORAL | 0 refills | Status: DC

## 2021-06-15 MED ORDER — HYDROCODONE BIT-HOMATROP MBR 5-1.5 MG/5ML PO SOLN: 5.0000 mL | Freq: Four times a day (QID) | ORAL | 0 refills | Status: AC | PRN

## 2021-06-15 NOTE — Progress Notes (Signed)
Patient ID: Joseph Rocha, male   DOB: 1934/11/15, 86 y.o.   MRN: [id number]  Cumulative time during 7-day interval 12 min, there was not an associated office visit for this concern within a 7 day period.  Verbal consent for services obtained from patient prior to services given.  Names of all persons present for services: Cathlean Cower, MD, patient  Chief complaint: sinus infection  History, background, results pertinent:   Here with 2-3 days acute onset fever, facial pain, pressure, headache, general weakness and malaise, and greenish d/c, with mild ST and cough, but pt denies chest pain, wheezing, increased sob or doe, orthopnea, PND, increased LE swelling, palpitations, dizziness or syncope.  Was tested for covid infection yesterday, but results still pending.  Wife had covid in June 2022 but he did not.  Does have several wks ongoing nasal allergy symptoms with clearish congestion, itch and sneezing, without fever, pain, ST, cough, swelling or wheezing.   Pt denies polydipsia, polyuria, or new focal neuro s/s.      Past Medical History:  Diagnosis Date   Allergy    Anemia    Diabetes mellitus    Elevated PSA    Hyperlipidemia    Hypertension    Hypogonadism male    OA (osteoarthritis)    Pituitary abnormality ([place]) 2004   No results found for this or any previous visit (from the past 48 hour(s)). Current Outpatient Medications on File Prior to Visit  Medication Sig Dispense Refill   aspirin EC 81 MG tablet Take 1 tablet (81 mg total) by mouth daily. Swallow whole. 30 tablet 11   calcium carbonate (OS-CAL) 600 MG TABS Take 600 mg by mouth daily.     cetirizine-pseudoephedrine (ZYRTEC-D) 5-120 MG tablet Take 1 tablet by mouth daily. 90 tablet 2   diphenoxylate-atropine (LOMOTIL) 2.5-0.025 MG tablet Take 1 tablet by mouth 4 (four) times daily as needed for diarrhea or loose stools. 30 tablet 1   EPINEPHrine 0.3 mg/0.3 mL IJ SOAJ injection Inject 0.3 mg into the muscle once as needed  (allergic reaction).  0   finasteride (PROSCAR) 5 MG tablet TAKE 1 TABLET DAILY 90 tablet 1   fludrocortisone (FLORINEF) 0.1 MG tablet Take 1 tablet (0.1 mg total) by mouth daily. 90 tablet 2   fluticasone (FLONASE) 50 MCG/ACT nasal spray Place 2 sprays into both nostrils daily. 16 g 6   folic acid (FOLVITE) 1 MG tablet Take 1 tablet (1 mg total) by mouth daily. 90 tablet 3   IRON PO Take by mouth.     KLOR-CON M20 20 MEQ tablet Take 20 mEq by mouth daily.      levothyroxine (SYNTHROID) 50 MCG tablet TAKE 1 TABLET DAILY 90 tablet 0   loperamide (IMODIUM A-D) 2 MG tablet Take 1 tablet (2 mg total) by mouth 2 (two) times daily as needed for diarrhea or loose stools. 30 tablet 1   loperamide (IMODIUM A-D) 2 MG tablet      meclizine (ANTIVERT) 12.5 MG tablet Take 1 tablet (12.5 mg total) by mouth 3 (three) times daily as needed for dizziness. 30 tablet 1   metFORMIN (GLUCOPHAGE) 500 MG tablet TAKE 1 TABLET TWICE DAILY  WITH MEALS 180 tablet 3   metFORMIN (GLUCOPHAGE-XR) 500 MG 24 hr tablet TAKE 2 TABLETS DAILY WITH  BREAKFAST 120 tablet 0   methocarbamol (ROBAXIN) 500 MG tablet 1 tab by mouth at bedtime as needed 90 tablet 1   Multiple Vitamins-Minerals (CENTRUM SILVER PO) Take 1 tablet by  mouth daily.     Omega-3 Fatty Acids (FISH OIL) 1000 MG CAPS Take 1 capsule by mouth daily.     omeprazole (PRILOSEC) 40 MG capsule TAKE 1 CAPSULE DAILY 90 capsule 1   repaglinide (PRANDIN) 1 MG tablet Take 1 tablet (1 mg total) by mouth 3 (three) times daily before meals. 270 tablet 3   rosuvastatin (CRESTOR) 5 MG tablet Take 1 tablet (5 mg total) by mouth daily. 90 tablet 2   Tamsulosin HCl (FLOMAX) 0.4 MG CAPS Take 0.4 mg by mouth daily.     Testosterone 30 MG/ACT SOLN Place 2 Pump onto the skin daily.     traMADol (ULTRAM) 50 MG tablet Take 1 tablet (50 mg total) by mouth every 6 (six) hours as needed. 15 tablet 0   triamcinolone (NASACORT) 55 MCG/ACT AERO nasal inhaler Place 2 sprays into the nose daily. 1  each 12   No current facility-administered medications on file prior to visit.   Lab Results  Component Value Date   WBC 6.1 03/24/2021   HGB 13.8 03/24/2021   HCT 40.4 03/24/2021   PLT 206.0 03/24/2021   GLUCOSE 92 03/24/2021   CHOL 152 01/16/2021   TRIG 119.0 01/16/2021   HDL 41.10 01/16/2021   LDLCALC 87 01/16/2021   ALT 16 03/24/2021   AST 22 03/24/2021   NA 134 (L) 03/24/2021   K 3.9 03/24/2021   CL 99 03/24/2021   CREATININE 1.45 03/24/2021   BUN 22 03/24/2021   CO2 26 03/24/2021   TSH 2.65 01/16/2021   PSA 3.04 07/07/2014   HGBA1C 6.7 (H) 01/16/2021   MICROALBUR <0.7 01/16/2021    A/P/next steps:   1)  acute sinusitis - Mild to mod, for antibx course,  cough med prn, to f/u any worsening symptoms or concerns; pt to let us know final result of his covid testing still pending  2)  allergic rhinitis - with current recent flare, for predpac asd,  to f/u any worsening symptoms or concerns  3)  DM -  Lab Results  Component Value Date   HGBA1C 6.7 (H) 01/16/2021   Stable, pt to continue current medical treatment metformin, prandin  Cathlean Cower MD

## 2021-06-16 ENCOUNTER — Telehealth: Payer: Self-pay

## 2021-06-16 NOTE — Telephone Encounter (Signed)
PA started for Hydromet 5-1.'5MG'$ /ML solution.  Key: [id number]

## 2021-06-19 NOTE — Telephone Encounter (Signed)
Spoke with pt and was able to inform that the PA has been sent and the office is awaiting on a response in regards to the PA's decision.

## 2021-06-20 ENCOUNTER — Encounter: Payer: Self-pay | Admitting: Internal Medicine

## 2021-06-20 NOTE — Patient Instructions (Signed)
Please take all new medication as prescribed  Please let us know the results of your covid testing

## 2021-06-26 DIAGNOSIS — Z20822 Contact with and (suspected) exposure to covid-19: Secondary | ICD-10-CM | POA: Diagnosis not present

## 2021-08-01 ENCOUNTER — Other Ambulatory Visit: Payer: Self-pay | Admitting: Endocrinology

## 2021-08-01 DIAGNOSIS — E119 Type 2 diabetes mellitus without complications: Secondary | ICD-10-CM

## 2021-08-23 ENCOUNTER — Other Ambulatory Visit: Payer: Self-pay

## 2021-08-23 ENCOUNTER — Telehealth: Payer: Self-pay | Admitting: Endocrinology

## 2021-08-23 DIAGNOSIS — E1165 Type 2 diabetes mellitus with hyperglycemia: Secondary | ICD-10-CM

## 2021-08-23 MED ORDER — REPAGLINIDE 1 MG PO TABS: 1.0000 mg | ORAL_TABLET | Freq: Three times a day (TID) | ORAL | 0 refills | Status: DC

## 2021-08-23 NOTE — Telephone Encounter (Signed)
Rx sent 

## 2021-08-23 NOTE — Telephone Encounter (Signed)
Pt calling in to request refill for repaglinide (PRANDIN) 1 MG tablet   [facility] [place] [place] [facility]   Pt contact [phone]

## 2021-09-05 ENCOUNTER — Other Ambulatory Visit: Payer: Self-pay

## 2021-09-05 ENCOUNTER — Encounter: Payer: Self-pay | Admitting: Internal Medicine

## 2021-09-05 ENCOUNTER — Ambulatory Visit (INDEPENDENT_AMBULATORY_CARE_PROVIDER_SITE_OTHER): Payer: Medicare Other | Admitting: Internal Medicine

## 2021-09-05 DIAGNOSIS — H9203 Otalgia, bilateral: Secondary | ICD-10-CM | POA: Diagnosis not present

## 2021-09-05 MED ORDER — AZITHROMYCIN 250 MG PO TABS: ORAL_TABLET | ORAL | 0 refills | Status: AC

## 2021-09-05 NOTE — Progress Notes (Signed)
   Subjective:   Patient ID: Joseph Rocha, male    DOB: [birth year], 86 y.o.   MRN: [id number]  HPI The patient is an 86 YO man coming in for ear pain.  Review of Systems  Constitutional: Negative.   HENT:  Positive for ear pain.   Eyes: Negative.   Respiratory:  Negative for cough, chest tightness and shortness of breath.   Cardiovascular:  Negative for chest pain, palpitations and leg swelling.  Gastrointestinal:  Negative for abdominal distention, abdominal pain, constipation, diarrhea, nausea and vomiting.  Musculoskeletal: Negative.   Skin: Negative.   Neurological: Negative.   Psychiatric/Behavioral: Negative.     Objective:  Physical Exam Constitutional:      Appearance: He is well-developed.  HENT:     Head: Normocephalic and atraumatic.     Ears:     Comments: TM bulging bilaterally Cardiovascular:     Rate and Rhythm: Normal rate and regular rhythm.  Pulmonary:     Effort: Pulmonary effort is normal. No respiratory distress.     Breath sounds: Normal breath sounds. No wheezing or rales.  Abdominal:     General: Bowel sounds are normal. There is no distension.     Palpations: Abdomen is soft.     Tenderness: There is no abdominal tenderness. There is no rebound.  Musculoskeletal:     Cervical back: Normal range of motion.  Skin:    General: Skin is warm and dry.  Neurological:     Mental Status: He is alert and oriented to person, place, and time.     Coordination: Coordination normal.    Vitals:   09/05/21 1328  BP: 122/74  Pulse: 71  Resp: 18  SpO2: 97%  Weight: 211 lb 3.2 oz (95.8 kg)  Height: 6\' 3"  (1.905 m)    This visit occurred during the SARS-CoV-2 public health emergency.  Safety protocols were in place, including screening questions prior to the visit, additional usage of staff PPE, and extensive cleaning of exam room while observing appropriate contact time as indicated for disinfecting solutions.   Assessment & Plan:

## 2021-09-05 NOTE — Patient Instructions (Addendum)
We have sent in azithromycin to take 2 pills today, then starting tomorrow take 1 pill daily.  We have cleaned out the ear.

## 2021-09-05 NOTE — Assessment & Plan Note (Signed)
New and appears to be otitis media. Rx azithromycin as he did well with this in the past.

## 2021-09-11 ENCOUNTER — Ambulatory Visit: Payer: Medicare Other | Admitting: Endocrinology

## 2021-09-12 DIAGNOSIS — E876 Hypokalemia: Secondary | ICD-10-CM | POA: Diagnosis not present

## 2021-09-12 DIAGNOSIS — K2970 Gastritis, unspecified, without bleeding: Secondary | ICD-10-CM | POA: Diagnosis not present

## 2021-09-21 ENCOUNTER — Other Ambulatory Visit: Payer: Self-pay

## 2021-09-21 ENCOUNTER — Ambulatory Visit (INDEPENDENT_AMBULATORY_CARE_PROVIDER_SITE_OTHER): Payer: Medicare Other | Admitting: Endocrinology

## 2021-09-21 VITALS — BP 110/60 | HR 71 | Ht 75.0 in | Wt 209.6 lb

## 2021-09-21 DIAGNOSIS — E1165 Type 2 diabetes mellitus with hyperglycemia: Secondary | ICD-10-CM | POA: Diagnosis not present

## 2021-09-21 LAB — POCT GLYCOSYLATED HEMOGLOBIN (HGB A1C): Hemoglobin A1C: 8.5 % — AB (ref 4.0–5.6)

## 2021-09-21 MED ORDER — REPAGLINIDE 2 MG PO TABS: 2.0000 mg | ORAL_TABLET | Freq: Two times a day (BID) | ORAL | 3 refills | Status: DC

## 2021-09-21 NOTE — Progress Notes (Signed)
Subjective:    Patient ID: Joseph Rocha, male    DOB: [birth year], 86 y.o.   MRN: [id number]  HPI Pt returns for f/u of diabetes mellitus:  DM type: 2 Dx'ed: 9629 Complications: PN  and CRI.   Therapy: 2 oral meds.   DKA: never.   Severe hypoglycemia: never.   Pancreatitis: never.   Pancreatic imaging: normal on 2019 CT.   Other: he took insulin for a brief time, soon after dx.   SDOH: he could not afford Rybelsus.   Interval history: pt has not recently checked cbg.  He takes meds as rx'ed.  pt states he feels well in general. Past Medical History:  Diagnosis Date   Allergy    Anemia    Diabetes mellitus    Elevated PSA    Hyperlipidemia    Hypertension    Hypogonadism male    OA (osteoarthritis)    Pituitary abnormality ([place]) 2004    Past Surgical History:  Procedure Laterality Date   DOPPLER ECHOCARDIOGRAPHY  12/04/2001   ELECTROCARDIOGRAM  03/25/2006   EP IMPLANTABLE DEVICE N/A 03/16/2016   Procedure: Loop Recorder Insertion;  Surgeon: Deboraha Sprang, MD;  Location: [facility];  Service: Cardiovascular;  Laterality: N/A;   KNEE ARTHROSCOPY Left    LUMBAR DISC SURGERY     MENISCUS REPAIR Right may 2015   TONSILLECTOMY     TRANSPHENOIDAL / TRANSNASAL HYPOPHYSECTOMY / RESECTION PITUITARY TUMOR  2005    Social History   Socioeconomic History   Marital status: Married    Spouse name: Not on file   Number of children: 2   Years of education: Not on file   Highest education level: Not on file  Occupational History   Occupation: retired    Fish farm manager: RETIRED  Tobacco Use   Smoking status: Never   Smokeless tobacco: Never  Vaping Use   Vaping Use: Never used  Substance and Sexual Activity   Alcohol use: No   Drug use: No   Sexual activity: Not Currently  Other Topics Concern   Not on file  Social History Narrative   Lives with wife.  They have two grown children.   He is retired from the [employer].   Social Determinants of Health    Financial Resource Strain: Low Risk    Difficulty of Paying Living Expenses: Not hard at all  Food Insecurity: No Food Insecurity   Worried About Charity fundraiser in the Last Year: Never true   [place] in the Last Year: Never true  Transportation Needs: No Transportation Needs   Lack of Transportation (Medical): No   Lack of Transportation (Non-Medical): No  Physical Activity: Sufficiently Active   Days of Exercise per Week: 5 days   Minutes of Exercise per Session: 30 min  Stress: No Stress Concern Present   Feeling of Stress : Not at all  Social Connections: Socially Integrated   Frequency of Communication with Friends and Family: More than three times a week   Frequency of Social Gatherings with Friends and Family: More than three times a week   Attends Religious Services: More than 4 times per year   Active Member of [employer] or Organizations: Yes   Attends Music therapist: More than 4 times per year   Marital Status: Married  Human resources officer Violence: Not on file    Current Outpatient Medications on File Prior to Visit  Medication Sig Dispense Refill   aspirin EC  81 MG tablet Take 1 tablet (81 mg total) by mouth daily. Swallow whole. 30 tablet 11   calcium carbonate (OS-CAL) 600 MG TABS Take 600 mg by mouth daily.     cetirizine-pseudoephedrine (ZYRTEC-D) 5-120 MG tablet Take 1 tablet by mouth daily. 90 tablet 2   diphenoxylate-atropine (LOMOTIL) 2.5-0.025 MG tablet Take 1 tablet by mouth 4 (four) times daily as needed for diarrhea or loose stools. 30 tablet 1   EPINEPHrine 0.3 mg/0.3 mL IJ SOAJ injection Inject 0.3 mg into the muscle once as needed (allergic reaction).  0   finasteride (PROSCAR) 5 MG tablet TAKE 1 TABLET DAILY 90 tablet 1   fludrocortisone (FLORINEF) 0.1 MG tablet Take 1 tablet (0.1 mg total) by mouth daily. 90 tablet 2   fluticasone (FLONASE) 50 MCG/ACT nasal spray Place 2 sprays into both nostrils daily. 16 g 6   folic acid  (FOLVITE) 1 MG tablet Take 1 tablet (1 mg total) by mouth daily. 90 tablet 3   IRON PO Take by mouth.     KLOR-CON M20 20 MEQ tablet Take 20 mEq by mouth daily.      levothyroxine (SYNTHROID) 50 MCG tablet TAKE 1 TABLET DAILY 90 tablet 0   loperamide (IMODIUM A-D) 2 MG tablet Take 1 tablet (2 mg total) by mouth 2 (two) times daily as needed for diarrhea or loose stools. 30 tablet 1   loperamide (IMODIUM A-D) 2 MG tablet      meclizine (ANTIVERT) 12.5 MG tablet Take 1 tablet (12.5 mg total) by mouth 3 (three) times daily as needed for dizziness. 30 tablet 1   metFORMIN (GLUCOPHAGE) 500 MG tablet TAKE 1 TABLET TWICE DAILY  WITH MEALS 180 tablet 3   metFORMIN (GLUCOPHAGE-XR) 500 MG 24 hr tablet TAKE 2 TABLETS DAILY WITH  BREAKFAST 120 tablet 0   methocarbamol (ROBAXIN) 500 MG tablet 1 tab by mouth at bedtime as needed 90 tablet 1   Multiple Vitamins-Minerals (CENTRUM SILVER PO) Take 1 tablet by mouth daily.     Omega-3 Fatty Acids (FISH OIL) 1000 MG CAPS Take 1 capsule by mouth daily.     omeprazole (PRILOSEC) 40 MG capsule TAKE 1 CAPSULE DAILY 90 capsule 1   rosuvastatin (CRESTOR) 5 MG tablet Take 1 tablet (5 mg total) by mouth daily. 90 tablet 2   Tamsulosin HCl (FLOMAX) 0.4 MG CAPS Take 0.4 mg by mouth daily.     Testosterone 30 MG/ACT SOLN Place 2 Pump onto the skin daily.     traMADol (ULTRAM) 50 MG tablet Take 1 tablet (50 mg total) by mouth every 6 (six) hours as needed. 15 tablet 0   triamcinolone (NASACORT) 55 MCG/ACT AERO nasal inhaler Place 2 sprays into the nose daily. 1 each 12   No current facility-administered medications on file prior to visit.    Allergies  Allergen Reactions   Lipitor [Atorvastatin] Other (See Comments)    Myalgias, cramps in hand    Family History  Problem Relation Age of Onset   Cancer Brother        uncertain type   Diabetes Mellitus II Mother        Deceased, 74s   Heart attack Father    Healthy Daughter     BP 110/60 (BP Location: Right Arm,  Patient Position: Sitting, Cuff Size: Large)   Pulse 71   Ht 6\' 3"  (1.905 m)   Wt 209 lb 9.6 oz (95.1 kg)   SpO2 96%   BMI 26.20 kg/m  Review of Systems He denies hypoglycemia    Objective:   Physical Exam    Lab Results  Component Value Date   CREATININE 1.45 03/24/2021   BUN 22 03/24/2021   NA 134 (L) 03/24/2021   K 3.9 03/24/2021   CL 99 03/24/2021   CO2 26 03/24/2021   Lab Results  Component Value Date   HGBA1C 8.5 (A) 09/21/2021       Assessment & Plan:  Type 2 DM: uncontrolled.   Patient Instructions  Please continue the same metformin. I have sent a prescription to your pharmacy, to increase the repaglinide check your blood sugar once a day.  vary the time of day when you check, between before the 3 meals, and at bedtime.  also check if you have symptoms of your blood sugar being too high or too low.  please keep a record of the readings and bring it to your next appointment here (or you can bring the meter itself).  You can write it on any piece of paper.  please call us sooner if your blood sugar goes below 70, or if you have a lot of readings over 200.   Please come back for a follow-up appointment in 2 months.

## 2021-09-21 NOTE — Patient Instructions (Addendum)
Please continue the same metformin. I have sent a prescription to your pharmacy, to increase the repaglinide check your blood sugar once a day.  vary the time of day when you check, between before the 3 meals, and at bedtime.  also check if you have symptoms of your blood sugar being too high or too low.  please keep a record of the readings and bring it to your next appointment here (or you can bring the meter itself).  You can write it on any piece of paper.  please call us sooner if your blood sugar goes below 70, or if you have a lot of readings over 200.   Please come back for a follow-up appointment in 2 months.

## 2021-10-02 DIAGNOSIS — E291 Testicular hypofunction: Secondary | ICD-10-CM | POA: Diagnosis not present

## 2021-10-05 ENCOUNTER — Other Ambulatory Visit: Payer: Self-pay | Admitting: Internal Medicine

## 2021-10-05 NOTE — Telephone Encounter (Signed)
Please refill as per office routine med refill policy (all routine meds to be refilled for 3 mo or monthly (per pt preference) up to one year from last visit, then month to month grace period for 3 mo, then further med refills will have to be denied) ? ?

## 2021-10-09 DIAGNOSIS — N401 Enlarged prostate with lower urinary tract symptoms: Secondary | ICD-10-CM | POA: Diagnosis not present

## 2021-10-09 DIAGNOSIS — E291 Testicular hypofunction: Secondary | ICD-10-CM | POA: Diagnosis not present

## 2021-10-09 DIAGNOSIS — R351 Nocturia: Secondary | ICD-10-CM | POA: Diagnosis not present

## 2021-10-16 ENCOUNTER — Telehealth: Payer: Self-pay | Admitting: Endocrinology

## 2021-10-16 ENCOUNTER — Other Ambulatory Visit: Payer: Self-pay

## 2021-10-16 DIAGNOSIS — E119 Type 2 diabetes mellitus without complications: Secondary | ICD-10-CM

## 2021-10-16 MED ORDER — LEVOTHYROXINE SODIUM 50 MCG PO TABS: 50.0000 ug | ORAL_TABLET | Freq: Every day | ORAL | 0 refills | Status: DC

## 2021-10-16 NOTE — Telephone Encounter (Signed)
PT called needing the below prescription today. levothyroxine (SYNTHROID) 50 MCG tablet  Please call pharmacy ASAP at [phone] levothyroxine (SYNTHROID) 50 MCG tablet  [facility] [place] [place] [facility] Phone:  [phone]  Fax:  [phone]      PT would like a 10 day prescription to be sent to:  [facility] [place], [place] AT [address] Phone:  [phone]  Fax:  [phone]

## 2021-10-29 ENCOUNTER — Other Ambulatory Visit: Payer: Self-pay | Admitting: Internal Medicine

## 2021-10-30 NOTE — Telephone Encounter (Signed)
Please refill as per office routine med refill policy (all routine meds to be refilled for 3 mo or monthly (per pt preference) up to one year from last visit, then month to month grace period for 3 mo, then further med refills will have to be denied) ? ?

## 2021-11-07 ENCOUNTER — Telehealth: Payer: Self-pay | Admitting: Endocrinology

## 2021-11-07 DIAGNOSIS — E119 Type 2 diabetes mellitus without complications: Secondary | ICD-10-CM

## 2021-11-07 NOTE — Telephone Encounter (Signed)
MEDICATION: Levothyroxine 50 MCG  PHARMACY:  [facility]  HAS THE PATIENT CONTACTED THEIR PHARMACY? yes   IS THIS A 90 DAY SUPPLY :  YES  IS PATIENT OUT OF MEDICATION: no  IF NOT; HOW MUCH IS LEFT: 1 week  LAST APPOINTMENT DATE: @12 /10/2021  DO WE HAVE YOUR PERMISSION TO LEAVE A DETAILED MESSAGE?: YES  OTHER COMMENTS:    **Let patient know to contact pharmacy at the end of the day to make sure medication is ready. **  ** Please notify patient to allow 48-72 hours to process**  **Encourage patient to contact the pharmacy for refills or they can request refills through [facility]**

## 2021-11-08 MED ORDER — LEVOTHYROXINE SODIUM 50 MCG PO TABS: 50.0000 ug | ORAL_TABLET | Freq: Every day | ORAL | 0 refills | Status: DC

## 2021-11-08 NOTE — Telephone Encounter (Signed)
RX has now been sent to pharmacy

## 2021-11-14 ENCOUNTER — Encounter: Payer: Self-pay | Admitting: Gastroenterology

## 2021-11-14 ENCOUNTER — Other Ambulatory Visit (INDEPENDENT_AMBULATORY_CARE_PROVIDER_SITE_OTHER): Payer: Medicare Other

## 2021-11-14 ENCOUNTER — Ambulatory Visit (INDEPENDENT_AMBULATORY_CARE_PROVIDER_SITE_OTHER): Payer: Medicare Other | Admitting: Gastroenterology

## 2021-11-14 VITALS — BP 120/72 | HR 70 | Ht 75.0 in | Wt 212.0 lb

## 2021-11-14 DIAGNOSIS — K219 Gastro-esophageal reflux disease without esophagitis: Secondary | ICD-10-CM

## 2021-11-14 DIAGNOSIS — R142 Eructation: Secondary | ICD-10-CM

## 2021-11-14 LAB — CBC WITH DIFFERENTIAL/PLATELET
Basophils Absolute: 0 10*3/uL (ref 0.0–0.1)
Basophils Relative: 0.8 % (ref 0.0–3.0)
Eosinophils Absolute: 0.2 10*3/uL (ref 0.0–0.7)
Eosinophils Relative: 3.8 % (ref 0.0–5.0)
HCT: 37.2 % — ABNORMAL LOW (ref 39.0–52.0)
Hemoglobin: 12.5 g/dL — ABNORMAL LOW (ref 13.0–17.0)
Lymphocytes Relative: 37.4 % (ref 12.0–46.0)
Lymphs Abs: 1.9 10*3/uL (ref 0.7–4.0)
MCHC: 33.6 g/dL (ref 30.0–36.0)
MCV: 90.2 fl (ref 78.0–100.0)
Monocytes Absolute: 0.5 10*3/uL (ref 0.1–1.0)
Monocytes Relative: 10.4 % (ref 3.0–12.0)
Neutro Abs: 2.5 10*3/uL (ref 1.4–7.7)
Neutrophils Relative %: 47.6 % (ref 43.0–77.0)
Platelets: 193 10*3/uL (ref 150.0–400.0)
RBC: 4.13 Mil/uL — ABNORMAL LOW (ref 4.22–5.81)
RDW: 14.2 % (ref 11.5–15.5)
WBC: 5.2 10*3/uL (ref 4.0–10.5)

## 2021-11-14 LAB — COMPREHENSIVE METABOLIC PANEL
ALT: 16 U/L (ref 0–53)
AST: 23 U/L (ref 0–37)
Albumin: 4 g/dL (ref 3.5–5.2)
Alkaline Phosphatase: 51 U/L (ref 39–117)
BUN: 20 mg/dL (ref 6–23)
CO2: 29 mEq/L (ref 19–32)
Calcium: 9.2 mg/dL (ref 8.4–10.5)
Chloride: 99 mEq/L (ref 96–112)
Creatinine, Ser: 1.17 mg/dL (ref 0.40–1.50)
GFR: 57.13 mL/min — ABNORMAL LOW (ref >60.00)
Glucose, Bld: 172 mg/dL — ABNORMAL HIGH (ref 70–99)
Potassium: 3.5 mEq/L (ref 3.5–5.1)
Sodium: 135 mEq/L (ref 135–145)
Total Bilirubin: 0.7 mg/dL (ref 0.2–1.2)
Total Protein: 6.9 g/dL (ref 6.0–8.3)

## 2021-11-14 NOTE — Progress Notes (Signed)
° ° °  History of Present Illness: This is an 87 year old male who relates persistent problems with intestinal gas and belching.  He relates that he was on a cruise in November and while on a cruise had nausea and vomiting that lasted for a couple days and resolved and has not recurred.  He has no other gastrointestinal complaints.  Colonoscopy performed in February 2012 for screening showed mild sigmoid colon diverticulosis and internal hemorrhoids. He is not checking his glucose regularly at home however he checked it within the past few days and his morning fasting glucose was 250.   Current Medications, Allergies, Past Medical History, Past Surgical History, Family History and Social History were reviewed in [employer] record.   Physical Exam: General: Well developed, well nourished, no acute distress Head: Normocephalic and atraumatic Eyes: Sclerae anicteric, EOMI Ears: Normal auditory acuity Mouth: Not examined, mask on during Covid-19 pandemic Lungs: Clear throughout to auscultation Heart: Regular rate and rhythm; no murmurs, rubs or bruits Abdomen: Soft, non tender and non distended. No masses, hepatosplenomegaly or hernias noted. Normal Bowel sounds Rectal: Not done Musculoskeletal: Symmetrical with no gross deformities  Pulses:  Normal pulses noted Extremities: No clubbing, cyanosis, edema or deformities noted Neurological: Alert oriented x 4, grossly nonfocal Psychological:  Alert and cooperative. Normal mood and affect   Assessment and Recommendations:  Gas, belching. R/O lactose intolerance. Lactose free diet for 7 days and if not improved then change to a low gas diet. Gaviscon tid prn or Gas-X tid prn.  If symptoms persist he is advised to call. CBC, CMP today.  GERD.  Follow antireflux measures and continue omeprazole 40 mg p.o. daily. DM HgA1C 8.5 in November.  Recent fasting morning glucose was 250.  He is advised to check his glucose every morning  and to contact Dr. Loanne Drilling for mgmt.

## 2021-11-14 NOTE — Patient Instructions (Signed)
Your provider has requested that you go to the basement level for lab work before leaving today. Press "B" on the elevator. The lab is located at the first door on the left as you exit the elevator.  Start a lactose free diet x 7 days to see if this help improve your symptoms. If not then start a low gas diet.   You can take over the counter Gas-x or Gaviscon three times a day for gas and bloating.   The [facility] providers would like to encourage you to use [facility] to communicate with providers for non-urgent requests or questions.  Due to long hold times on the telephone, sending your provider a message by [facility] may be a faster and more efficient way to get a response.  Please allow 48 business hours for a response.  Please remember that this is for non-urgent requests.   Due to recent changes in healthcare laws, you may see the results of your imaging and laboratory studies on MyChart before your provider has had a chance to review them.  We understand that in some cases there may be results that are confusing or concerning to you. Not all laboratory results come back in the same time frame and the provider may be waiting for multiple results in order to interpret others.  Please give [place] 48 hours in order for your provider to thoroughly review all the results before contacting the office for clarification of your results.   Thank you for choosing me and [facility].  Pricilla Riffle. Dagoberto Ligas., MD., [facility]  Lactose-Free Diet, Adult If you have lactose intolerance, you are not able to digest lactose. Lactose is a natural sugar found mainly in dairy milk and dairy products. A lactose-free diet can help you avoid foods and beverages that contain lactose. What are tips for following this plan? Reading food labels Do not consume foods, beverages, vitamins, minerals, or medicines containing lactose. Read ingredient lists carefully. Look for the words "lactose-free" on labels. Meal  planning Use alternatives to dairy milk and foods made with milk products. These include the following: Lactose-free milk. Soy milk with added calcium and vitamin D. Almond milk, coconut milk, rice milk, or other nondairy milk alternatives with added calcium and vitamin D. Note that a lot of these are low in protein. Soy products, such as soy yogurt, soy cheese, soy ice cream, and soy-based sour cream. Other nut milk products, such as almond yogurt, almond cheese, cashew yogurt, cashew cheese, cashew ice cream, coconut yogurt, and coconut ice cream. Medicines, vitamins, and supplements Use lactase enzyme drops or tablets as directed by your health care provider. Make sure you get enough calcium and vitamin D in your diet. A lactose-free eating plan can be lacking in these important nutrients. Take calcium and vitamin D supplements as directed by your health care provider. Talk with your health care provider about supplements if you are not able to get enough calcium and vitamin D from food. What foods should I eat? Fruits All fresh, canned, frozen, or dried fruits and fruit juices that are not processed with lactose. Vegetables All fresh, frozen, and canned vegetables without cheese, cream, or butter sauces. Grains Any that are not made with dairy milk or dairy products. Meats and other proteins Any meat, fish, poultry, and other protein sources that are not made with dairy milk or dairy products. Fats and oils Any that are not made with dairy milk or dairy products. Sweets and desserts Any that are  not made with dairy milk or dairy products. Seasonings and condiments Any that are not made with dairy milk or dairy products. Calcium Calcium is found in many foods that contain lactose and is important for bone health. The amount of calcium you need depends on your age: Adults younger than 50 years: 1,000 mg of calcium a day. Adults older than 50 years: 1,200 mg of calcium a day. If you  are not getting enough calcium, you may get it from other sources, including: Orange juice that has been fortified with calcium. This means that calcium has been added to the product. There are 300-350 mg of calcium in 1 cup (237 mL) of calcium-fortified orange juice. Soy milk fortified with calcium. There are 300-400 mg of calcium in 1 cup (237 mL) of calcium-fortified soy milk. Rice or almond milk fortified with calcium. There are 300 mg of calcium in 1 cup (237 mL) of calcium-fortified rice or almond milk. Breakfast cereals fortified with calcium. There are 100-1,000 mg of calcium in calcium-fortified breakfast cereals. Spinach, cooked. There are 145 mg of calcium in  cup (90 g) of cooked spinach. Edamame, cooked. There are 130 mg of calcium in  cup (47 g) of cooked edamame. Collard greens, cooked. There are 125 mg of calcium in  cup (85 g) of cooked collard greens. Kale, frozen or cooked. There are 90 mg of calcium in  cup (59 g) of cooked or frozen kale. Almonds. There are 95 mg of calcium in  cup (35 g) of almonds. Broccoli, cooked. There are 60 mg of calcium in 1 cup (156 g) of cooked broccoli. The items listed above may not be a complete list of foods and beverages you can eat. Contact a dietitian for more options. What foods should I avoid? Lactose is found in dairy milk and dairy products, such as: Yogurt. Cheese. Butter. Margarine. Sour cream. Cream. Whipped toppings and creamers. Ice cream and other dairy-based desserts. Lactose is also found in foods or products made with dairy milk or milk ingredients. To find out whether a food contains dairy milk or a milk ingredient, look at the ingredients list. Avoid foods with the statement "May contain milk" and foods that contain: Milk powder. Whey. Curd. Lactose. Lactoglobulin. The items listed above may not be a complete list of foods and beverages to avoid. Contact a dietitian for more information. Where to find more  information [employer] of Diabetes and Digestive and Kidney Diseases: [email or web address] Summary If you are lactose intolerant, it means that you are not able to digest lactose, a natural sugar found in milk and milk products. Following a lactose-free diet can help you manage this condition. Calcium is important for bone health and is found in many foods that contain lactose. Talk with your health care provider about other sources of calcium. This information is not intended to replace advice given to you by your health care provider. Make sure you discuss any questions you have with your health care provider. Document Revised: 09/27/2020 Document Reviewed: 09/27/2020 Elsevier Patient Education  2022 [employer].

## 2021-11-16 ENCOUNTER — Encounter: Payer: Self-pay | Admitting: Internal Medicine

## 2021-11-16 ENCOUNTER — Other Ambulatory Visit (INDEPENDENT_AMBULATORY_CARE_PROVIDER_SITE_OTHER): Payer: Medicare Other

## 2021-11-16 DIAGNOSIS — D649 Anemia, unspecified: Secondary | ICD-10-CM | POA: Diagnosis not present

## 2021-11-16 LAB — IBC PANEL
Iron: 81 ug/dL (ref 42–165)
Saturation Ratios: 28.8 % (ref 20.0–50.0)
TIBC: 281.4 ug/dL (ref 250.0–450.0)
Transferrin: 201 mg/dL — ABNORMAL LOW (ref 212.0–360.0)

## 2021-11-17 ENCOUNTER — Other Ambulatory Visit: Payer: Self-pay

## 2021-11-17 DIAGNOSIS — D649 Anemia, unspecified: Secondary | ICD-10-CM

## 2021-11-20 ENCOUNTER — Other Ambulatory Visit: Payer: Self-pay | Admitting: Endocrinology

## 2021-11-22 ENCOUNTER — Other Ambulatory Visit (INDEPENDENT_AMBULATORY_CARE_PROVIDER_SITE_OTHER): Payer: Medicare Other

## 2021-11-22 DIAGNOSIS — D649 Anemia, unspecified: Secondary | ICD-10-CM

## 2021-11-23 ENCOUNTER — Telehealth: Payer: Self-pay | Admitting: Gastroenterology

## 2021-11-23 LAB — IBC + FERRITIN
Ferritin: 130.2 ng/mL (ref 22.0–322.0)
Iron: 57 ug/dL (ref 42–165)
Saturation Ratios: 19 % — ABNORMAL LOW (ref 20.0–50.0)
TIBC: 299.6 ug/dL (ref 250.0–450.0)
Transferrin: 214 mg/dL (ref 212.0–360.0)

## 2021-11-23 NOTE — Telephone Encounter (Signed)
Patient calling in regards to FeSO4 - 325 mg daily, seeking advice, please advise.

## 2021-11-23 NOTE — Telephone Encounter (Signed)
Patient called back and states he is already taking Iron supplement 65 mg (325mg ) daily and will continue

## 2021-12-07 ENCOUNTER — Other Ambulatory Visit: Payer: Self-pay | Admitting: Internal Medicine

## 2021-12-21 ENCOUNTER — Ambulatory Visit (INDEPENDENT_AMBULATORY_CARE_PROVIDER_SITE_OTHER): Payer: Medicare Other | Admitting: Internal Medicine

## 2021-12-21 ENCOUNTER — Other Ambulatory Visit: Payer: Self-pay

## 2021-12-21 ENCOUNTER — Encounter: Payer: Self-pay | Admitting: Internal Medicine

## 2021-12-21 VITALS — BP 114/78 | HR 64 | Temp 98.0°F | Ht 75.0 in | Wt 205.0 lb

## 2021-12-21 DIAGNOSIS — J019 Acute sinusitis, unspecified: Secondary | ICD-10-CM | POA: Insufficient documentation

## 2021-12-21 DIAGNOSIS — E1165 Type 2 diabetes mellitus with hyperglycemia: Secondary | ICD-10-CM

## 2021-12-21 DIAGNOSIS — J029 Acute pharyngitis, unspecified: Secondary | ICD-10-CM | POA: Diagnosis not present

## 2021-12-21 DIAGNOSIS — R110 Nausea: Secondary | ICD-10-CM

## 2021-12-21 MED ORDER — DIPHENOXYLATE-ATROPINE 2.5-0.025 MG PO TABS: 1.0000 | ORAL_TABLET | Freq: Four times a day (QID) | ORAL | 1 refills | Status: DC | PRN

## 2021-12-21 MED ORDER — ONDANSETRON HCL 4 MG PO TABS: 4.0000 mg | ORAL_TABLET | Freq: Three times a day (TID) | ORAL | 0 refills | Status: DC | PRN

## 2021-12-21 MED ORDER — AZITHROMYCIN 250 MG PO TABS: ORAL_TABLET | ORAL | 1 refills | Status: AC

## 2021-12-21 MED ORDER — LOPERAMIDE HCL 2 MG PO TABS: 2.0000 mg | ORAL_TABLET | Freq: Two times a day (BID) | ORAL | 0 refills | Status: DC | PRN

## 2021-12-21 NOTE — Progress Notes (Signed)
Patient ID: Joseph Rocha, male   DOB: 31-Mar-1935, 87 y.o.   MRN: [id number]        Chief Complaint: follow up sinus symptoms, nausea       HPI:  Joseph Rocha is a 87 y.o. male  Here with 2 days acute onset fever, facial pain, pressure, headache, general weakness and malaise, and greenish d/c, with mild ST and cough, but pt denies chest pain, wheezing, increased sob or doe, orthopnea, PND, increased LE swelling, palpitations, dizziness or syncope.  Denies worsening reflux, abd pain, dysphagia,bowel change or blood but does have mild intermittent nausea.   Pt denies polydipsia, polyuria, or new focal neuro s/s.       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 He has never used smokeless tobacco. He reports that he does not drink alcohol and does not use drugs. family history includes Cancer in his brother; Diabetes Mellitus II in his mother; Healthy in his daughter; Heart attack in his father. Allergies  Allergen Reactions   Lipitor [Atorvastatin] Other (See Comments)    Myalgias,  cramps in hand   Current Outpatient Medications on File Prior to Visit  Medication Sig Dispense Refill   aspirin EC 81 MG tablet Take 1 tablet (81 mg total) by mouth daily. Swallow whole. 30 tablet 11   calcium carbonate (OS-CAL) 600 MG TABS Take 600 mg by mouth daily.     cetirizine-pseudoephedrine (ZYRTEC-D) 5-120 MG tablet Take 1 tablet by mouth daily. 90 tablet 2   EPINEPHrine 0.3 mg/0.3 mL IJ SOAJ injection Inject 0.3 mg into the muscle once as needed (allergic reaction).  0   finasteride (PROSCAR) 5 MG tablet TAKE 1 TABLET DAILY 90 tablet 1   fludrocortisone (FLORINEF) 0.1 MG tablet TAKE 1 TABLET DAILY 90 tablet 1   fluticasone (FLONASE) 50 MCG/ACT nasal spray Place 2 sprays into both nostrils daily. 16 g 6   folic acid (FOLVITE) 1 MG tablet Take 1 tablet (1 mg total) by mouth daily. 90 tablet 3   IRON PO Take by mouth.     KLOR-CON M20 20 MEQ tablet Take 20 mEq by mouth daily.      levothyroxine (SYNTHROID) 50 MCG tablet Take 1 tablet (50 mcg total) by mouth daily. 90 tablet 0   meclizine (ANTIVERT) 12.5 MG tablet Take 1 tablet (12.5 mg total) by mouth 3 (three) times daily as needed for  dizziness. 30 tablet 1   metFORMIN (GLUCOPHAGE) 500 MG tablet TAKE 1 TABLET TWICE DAILY  WITH MEALS 180 tablet 3   methocarbamol (ROBAXIN) 500 MG tablet 1 tab by mouth at bedtime as needed 90 tablet 1   Multiple Vitamins-Minerals (CENTRUM SILVER PO) Take 1 tablet by mouth daily.     Omega-3 Fatty Acids (FISH OIL) 1000 MG CAPS Take 1 capsule by mouth daily.     omeprazole (PRILOSEC) 40 MG capsule TAKE 1 CAPSULE DAILY 90 capsule 1   repaglinide (PRANDIN) 2 MG tablet Take 1 tablet (2 mg total) by mouth 2 (two) times daily before a meal. 180 tablet 3   rosuvastatin (CRESTOR) 5 MG tablet TAKE 1 TABLET DAILY 90 tablet 3   Tamsulosin HCl (FLOMAX) 0.4 MG CAPS Take 0.4 mg by mouth daily.     Testosterone 30 MG/ACT SOLN Place 2 Pump onto the skin daily.     traMADol (ULTRAM) 50 MG tablet Take 1 tablet (50 mg  total) by mouth every 6 (six) hours as needed. 15 tablet 0   triamcinolone (NASACORT) 55 MCG/ACT AERO nasal inhaler Place 2 sprays into the nose daily. 1 each 12   No current facility-administered medications on file prior to visit.        ROS:  All others reviewed and negative.  Objective        PE:  BP 114/78 (BP Location: Left Arm, Patient Position: Sitting, Cuff Size: Large)    Pulse 64    Temp 98 F (36.7 C) (Oral)    Ht 6\' 3"  (1.905 m)    Wt 205 lb (93 kg)    SpO2 96%    BMI 25.62 kg/m                 Constitutional: Pt appears in NAD               HENT: Head: NCAT.                Right Ear: External ear normal.                 Left Ear: External ear normal. Bilat tm's with mild erythema.  Max sinus areas mild tender.  Pharynx with mild erythema, no exudate               Eyes: . Pupils are equal, round, and reactive to light. Conjunctivae and EOM are normal               Nose: without d/c or deformity               Neck: Neck supple. Gross normal ROM               Cardiovascular: Normal rate and regular rhythm.                 Pulmonary/Chest: Effort normal and breath sounds without rales or wheezing.                Abd:  Soft, NT, ND, + BS, no organomegaly               Neurological: Pt is alert. At baseline orientation, motor grossly intact               Skin: Skin is warm. No rashes, no other new lesions, LE edema - none               Psychiatric: Pt behavior is normal without agitation  Micro: none  Cardiac tracings I have personally interpreted today:  none  Pertinent Radiological findings (summarize): none   Lab Results  Component Value Date   WBC 5.2 11/14/2021   HGB 12.5 (L) 11/14/2021   HCT 37.2 (L) 11/14/2021   PLT 193.0 11/14/2021   GLUCOSE 172 (H) 11/14/2021   CHOL 152 01/16/2021   TRIG 119.0 01/16/2021   HDL 41.10 01/16/2021   LDLCALC 87 01/16/2021   ALT 16 11/14/2021   AST 23 11/14/2021   NA 135 11/14/2021   K 3.5 11/14/2021   CL 99 11/14/2021    CREATININE 1.17 11/14/2021   BUN 20 11/14/2021   CO2 29 11/14/2021   TSH 2.65 01/16/2021   PSA 3.04 07/07/2014   HGBA1C 8.5 (A) 09/21/2021   MICROALBUR <0.7 01/16/2021   Rapid Strep A Screen Negative Negative    SARS Coronavirus 2 Ag Negative Negative    Assessment/Plan:  Joseph Rocha is a 87 y.o. Black or African American [2] male with  has a past medical history of Allergy, Anemia, Diabetes mellitus, Elevated PSA, Hyperlipidemia, Hypertension, Hypogonadism male, OA (osteoarthritis), and Pituitary abnormality ([place]) (2004).  Acute sinus infection Mild to mod, for antibx course,  to f/u any worsening symptoms or concerns  Nausea Mild, likely due to current infeciton, for zofran prn  Diabetes ([place]) Lab Results  Component Value Date   HGBA1C 8.5 (A) 09/21/2021   Stable, pt to continue current medical treatment metformin, prandin, f/u endo as planned  Followup: Return in about 3 months (around 03/20/2022).  Cathlean Cower, MD 12/24/2021 7:51 PM [facility]

## 2021-12-21 NOTE — Patient Instructions (Signed)
Please take all new medication as prescribed - the antibiotic, and nausea medication  Please continue all other medications as before, and refills have been done if requested.  Please have the pharmacy call with any other refills you may need.  Please keep your appointments with your specialists as you may have planned

## 2021-12-22 ENCOUNTER — Telehealth: Payer: Self-pay | Admitting: Internal Medicine

## 2021-12-22 NOTE — Telephone Encounter (Signed)
Patient states that the medication he was given the other day is making him feel light headed.  Please advise

## 2021-12-24 ENCOUNTER — Encounter: Payer: Self-pay | Admitting: Internal Medicine

## 2021-12-24 NOTE — Assessment & Plan Note (Signed)
Mild, likely due to current infeciton, for zofran prn

## 2021-12-24 NOTE — Assessment & Plan Note (Signed)
Lab Results  Component Value Date   HGBA1C 8.5 (A) 09/21/2021   Stable, pt to continue current medical treatment metformin, prandin, f/u endo as planned

## 2021-12-24 NOTE — Assessment & Plan Note (Signed)
Mild to mod, for antibx course,  to f/u any worsening symptoms or concerns 

## 2021-12-25 DIAGNOSIS — H2513 Age-related nuclear cataract, bilateral: Secondary | ICD-10-CM | POA: Diagnosis not present

## 2021-12-25 DIAGNOSIS — H25013 Cortical age-related cataract, bilateral: Secondary | ICD-10-CM | POA: Diagnosis not present

## 2021-12-25 DIAGNOSIS — E119 Type 2 diabetes mellitus without complications: Secondary | ICD-10-CM | POA: Diagnosis not present

## 2021-12-25 DIAGNOSIS — H5202 Hypermetropia, left eye: Secondary | ICD-10-CM | POA: Diagnosis not present

## 2021-12-25 LAB — POCT RAPID STREP A (OFFICE): Rapid Strep A Screen: NEGATIVE

## 2021-12-25 LAB — POC COVID19 BINAXNOW: SARS Coronavirus 2 Ag: NEGATIVE

## 2021-12-25 NOTE — Telephone Encounter (Signed)
Unable to reach patient.

## 2022-01-02 DIAGNOSIS — R3912 Poor urinary stream: Secondary | ICD-10-CM | POA: Diagnosis not present

## 2022-01-02 DIAGNOSIS — R300 Dysuria: Secondary | ICD-10-CM | POA: Diagnosis not present

## 2022-01-02 DIAGNOSIS — N401 Enlarged prostate with lower urinary tract symptoms: Secondary | ICD-10-CM | POA: Diagnosis not present

## 2022-01-11 ENCOUNTER — Other Ambulatory Visit: Payer: Self-pay

## 2022-01-11 DIAGNOSIS — E119 Type 2 diabetes mellitus without complications: Secondary | ICD-10-CM

## 2022-01-11 MED ORDER — LEVOTHYROXINE SODIUM 50 MCG PO TABS: 50.0000 ug | ORAL_TABLET | Freq: Every day | ORAL | 0 refills | Status: DC

## 2022-01-22 DIAGNOSIS — N401 Enlarged prostate with lower urinary tract symptoms: Secondary | ICD-10-CM | POA: Diagnosis not present

## 2022-01-22 DIAGNOSIS — R3912 Poor urinary stream: Secondary | ICD-10-CM | POA: Diagnosis not present

## 2022-01-22 DIAGNOSIS — R300 Dysuria: Secondary | ICD-10-CM | POA: Diagnosis not present

## 2022-01-26 ENCOUNTER — Ambulatory Visit: Payer: Medicare Other

## 2022-01-26 ENCOUNTER — Telehealth: Payer: Self-pay

## 2022-01-26 ENCOUNTER — Other Ambulatory Visit: Payer: Self-pay

## 2022-01-26 NOTE — Telephone Encounter (Signed)
This nurse called patient three times for scheduled telephonic AWV. Message left that we will call to reschedule for another time. ?

## 2022-03-19 ENCOUNTER — Other Ambulatory Visit: Payer: Self-pay

## 2022-03-19 DIAGNOSIS — E119 Type 2 diabetes mellitus without complications: Secondary | ICD-10-CM

## 2022-03-19 MED ORDER — LEVOTHYROXINE SODIUM 50 MCG PO TABS: 50.0000 ug | ORAL_TABLET | Freq: Every day | ORAL | 0 refills | Status: DC

## 2022-03-20 ENCOUNTER — Encounter: Payer: Self-pay | Admitting: Internal Medicine

## 2022-03-20 ENCOUNTER — Ambulatory Visit (INDEPENDENT_AMBULATORY_CARE_PROVIDER_SITE_OTHER): Payer: Medicare Other | Admitting: Internal Medicine

## 2022-03-20 VITALS — BP 126/70 | HR 65 | Temp 97.8°F | Ht 75.0 in | Wt 207.0 lb

## 2022-03-20 DIAGNOSIS — E7800 Pure hypercholesterolemia, unspecified: Secondary | ICD-10-CM | POA: Diagnosis not present

## 2022-03-20 DIAGNOSIS — E039 Hypothyroidism, unspecified: Secondary | ICD-10-CM

## 2022-03-20 DIAGNOSIS — E673 Hypervitaminosis D: Secondary | ICD-10-CM

## 2022-03-20 DIAGNOSIS — E1165 Type 2 diabetes mellitus with hyperglycemia: Secondary | ICD-10-CM

## 2022-03-20 DIAGNOSIS — E559 Vitamin D deficiency, unspecified: Secondary | ICD-10-CM

## 2022-03-20 DIAGNOSIS — E538 Deficiency of other specified B group vitamins: Secondary | ICD-10-CM | POA: Diagnosis not present

## 2022-03-20 LAB — CBC WITH DIFFERENTIAL/PLATELET
Basophils Absolute: 0 10*3/uL (ref 0.0–0.1)
Basophils Relative: 0.5 % (ref 0.0–3.0)
Eosinophils Absolute: 0.3 10*3/uL (ref 0.0–0.7)
Eosinophils Relative: 3.3 % (ref 0.0–5.0)
HCT: 36.8 % — ABNORMAL LOW (ref 39.0–52.0)
Hemoglobin: 12.4 g/dL — ABNORMAL LOW (ref 13.0–17.0)
Lymphocytes Relative: 41.5 % (ref 12.0–46.0)
Lymphs Abs: 3.6 10*3/uL (ref 0.7–4.0)
MCHC: 33.8 g/dL (ref 30.0–36.0)
MCV: 91.2 fl (ref 78.0–100.0)
Monocytes Absolute: 0.9 10*3/uL (ref 0.1–1.0)
Monocytes Relative: 10.6 % (ref 3.0–12.0)
Neutro Abs: 3.8 10*3/uL (ref 1.4–7.7)
Neutrophils Relative %: 44.1 % (ref 43.0–77.0)
Platelets: 195 10*3/uL (ref 150.0–400.0)
RBC: 4.03 Mil/uL — ABNORMAL LOW (ref 4.22–5.81)
RDW: 13.8 % (ref 11.5–15.5)
WBC: 8.6 10*3/uL (ref 4.0–10.5)

## 2022-03-20 LAB — HEPATIC FUNCTION PANEL
ALT: 17 U/L (ref 0–53)
AST: 22 U/L (ref 0–37)
Albumin: 4.2 g/dL (ref 3.5–5.2)
Alkaline Phosphatase: 49 U/L (ref 39–117)
Bilirubin, Direct: 0.1 mg/dL (ref 0.0–0.3)
Total Bilirubin: 0.5 mg/dL (ref 0.2–1.2)
Total Protein: 7.1 g/dL (ref 6.0–8.3)

## 2022-03-20 LAB — MICROALBUMIN / CREATININE URINE RATIO
Creatinine,U: 119.8 mg/dL
Microalb Creat Ratio: 0.6 mg/g (ref 0.0–30.0)
Microalb, Ur: <0.7 mg/dL (ref 0.0–1.9)

## 2022-03-20 LAB — BASIC METABOLIC PANEL
BUN: 21 mg/dL (ref 6–23)
CO2: 29 mEq/L (ref 19–32)
Calcium: 9.2 mg/dL (ref 8.4–10.5)
Chloride: 100 mEq/L (ref 96–112)
Creatinine, Ser: 1.31 mg/dL (ref 0.40–1.50)
GFR: 49.77 mL/min — ABNORMAL LOW (ref >60.00)
Glucose, Bld: 63 mg/dL — ABNORMAL LOW (ref 70–99)
Potassium: 3.5 mEq/L (ref 3.5–5.1)
Sodium: 137 mEq/L (ref 135–145)

## 2022-03-20 LAB — LIPID PANEL
Cholesterol: 162 mg/dL (ref 0–200)
HDL: 42.3 mg/dL (ref >39.00)
LDL Cholesterol: 99 mg/dL (ref 0–99)
NonHDL: 119.55
Total CHOL/HDL Ratio: 4
Triglycerides: 103 mg/dL (ref 0.0–149.0)
VLDL: 20.6 mg/dL (ref 0.0–40.0)

## 2022-03-20 LAB — TSH: TSH: 2.47 u[IU]/mL (ref 0.35–5.50)

## 2022-03-20 LAB — VITAMIN B12: Vitamin B-12: 405 pg/mL (ref 211–911)

## 2022-03-20 LAB — HEMOGLOBIN A1C: Hgb A1c MFr Bld: 6.5 % (ref 4.6–6.5)

## 2022-03-20 LAB — VITAMIN D 25 HYDROXY (VIT D DEFICIENCY, FRACTURES): VITD: 59.58 ng/mL (ref 30.00–100.00)

## 2022-03-20 NOTE — Progress Notes (Signed)
Patient ID: Joseph Rocha, male   DOB: Sep 30, 1935, 87 y.o.   MRN: [id number]         Chief Complaint:: yearly exam, DM, low thyroid, low vit d, hld       HPI:  Joseph Rocha is a 87 y.o. male here overall doing ok; Pt denies chest pain, increased sob or doe, wheezing, orthopnea, PND, increased LE swelling, palpitations, dizziness or syncope.   Pt denies polydipsia, polyuria, or new focal neuro s/s.    Pt denies fever, wt loss, night sweats, loss of appetite, or other constitutional symptoms  Denies hyper or hypo thyroid symptoms such as voice, skin or hair change.  Now taking Vit d.   Wt Readings from Last 3 Encounters:  03/20/22 207 lb (93.9 kg)  12/21/21 205 lb (93 kg)  11/14/21 212 lb (96.2 kg)   BP Readings from Last 3 Encounters:  03/20/22 126/70  12/21/21 114/78  11/14/21 120/72   Immunization History  Administered Date(s) Administered   Influenza, High Dose Seasonal PF 08/16/2017   Influenza,inj,Quad PF,6+ Mos 09/07/2013, 07/07/2014, 08/15/2016, 08/02/2018, 08/29/2019   PFIZER(Purple Top)SARS-COV-2 Vaccination 11/16/2019, 12/07/2019   Pneumococcal Conjugate-13 07/07/2014   Pneumococcal Polysaccharide-23 09/01/2008   Tdap 07/07/2014   There are no preventive care reminders to display for this patient.     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 He has never used smokeless tobacco. He reports that he does not drink alcohol and does not use drugs. family history includes Cancer in his brother; Diabetes Mellitus II in his mother; Healthy in his daughter; Heart attack in his father. Allergies  Allergen Reactions   Lipitor [Atorvastatin] Other (See Comments)    Myalgias, cramps in hand   Current Outpatient Medications on File Prior to Visit  Medication Sig Dispense Refill   aspirin EC 81 MG tablet Take 1 tablet (81 mg total) by mouth daily. Swallow whole. 30 tablet 11   calcium carbonate (OS-CAL) 600 MG TABS Take 600 mg by mouth daily.     cetirizine-pseudoephedrine (ZYRTEC-D) 5-120 MG tablet Take 1 tablet by mouth daily. 90 tablet 2   diphenoxylate-atropine (LOMOTIL) 2.5-0.025 MG tablet Take 1 tablet by mouth 4 (four) times daily as needed for diarrhea or loose stools. 30 tablet 1   finasteride (PROSCAR) 5 MG tablet TAKE 1 TABLET DAILY 90 tablet 1   fludrocortisone (FLORINEF) 0.1 MG tablet TAKE 1 TABLET DAILY 90 tablet 1   fluticasone (FLONASE) 50 MCG/ACT nasal spray Place 2 sprays into both nostrils daily. 16 g 6   folic acid (FOLVITE) 1 MG tablet Take 1 tablet (1 mg total) by mouth daily. 90 tablet 3   IRON PO Take by mouth.  KLOR-CON M20 20 MEQ tablet Take 20 mEq by mouth daily.      levothyroxine (SYNTHROID) 50 MCG tablet Take 1 tablet (50 mcg total) by mouth daily. 90 tablet 0   loperamide (IMODIUM A-D) 2 MG tablet Take 1 tablet (2 mg total) by mouth 2 (two) times daily as needed for diarrhea or loose stools. 30 tablet 0   meclizine (ANTIVERT) 12.5 MG tablet Take 1 tablet (12.5 mg total) by mouth 3 (three) times daily as needed for dizziness. 30 tablet 1   metFORMIN (GLUCOPHAGE) 500 MG tablet TAKE 1 TABLET TWICE DAILY  WITH MEALS 180 tablet 3   methocarbamol (ROBAXIN) 500 MG tablet 1 tab by mouth at bedtime as needed 90 tablet 1   Multiple Vitamins-Minerals  (CENTRUM SILVER PO) Take 1 tablet by mouth daily.     Omega-3 Fatty Acids (FISH OIL) 1000 MG CAPS Take 1 capsule by mouth daily.     omeprazole (PRILOSEC) 40 MG capsule TAKE 1 CAPSULE DAILY 90 capsule 1   ondansetron (ZOFRAN) 4 MG tablet Take 1 tablet (4 mg total) by mouth every 8 (eight) hours as needed for nausea or vomiting. 40 tablet 0   repaglinide (PRANDIN) 2 MG tablet Take 1 tablet (2 mg total) by mouth 2 (two) times daily before a meal. 180 tablet 3   rosuvastatin (CRESTOR) 5 MG tablet TAKE 1 TABLET DAILY 90 tablet 3   Tamsulosin HCl (FLOMAX) 0.4 MG CAPS Take 0.4 mg by mouth daily.     Testosterone 30 MG/ACT SOLN Place 2 Pump onto the skin daily.     traMADol (ULTRAM) 50 MG tablet Take 1 tablet (50 mg total) by mouth every 6 (six) hours as needed. 15 tablet 0   triamcinolone (NASACORT) 55 MCG/ACT AERO nasal inhaler Place 2 sprays into the nose daily. 1 each 12   EPINEPHrine 0.3 mg/0.3 mL IJ SOAJ injection Inject 0.3 mg into the muscle once as needed (allergic reaction). (Patient not taking: Reported on 03/20/2022)  0   No current facility-administered medications on file prior to visit.        ROS:  All others reviewed and negative.  Objective        PE:  BP 126/70 (BP Location: Right Arm, Patient Position: Sitting, Cuff Size: Large)   Pulse 65   Temp 97.8 F (36.6 C) (Oral)   Ht '6\' 3"'$  (1.905 m)   Wt 207 lb (93.9 kg)   SpO2 99%   BMI 25.87 kg/m                 Constitutional: Pt appears in NAD               HENT: Head: NCAT.                Right Ear: External ear normal.                 Left Ear: External ear normal.                Eyes: . Pupils are equal, round, and reactive to light. Conjunctivae and EOM are normal               Nose: without d/c or deformity               Neck: Neck supple. Gross normal ROM               Cardiovascular: Normal rate and regular rhythm.  Pulmonary/Chest: Effort normal and breath sounds without rales or wheezing.                 Abd:  Soft, NT, ND, + BS, no organomegaly               Neurological: Pt is alert. At baseline orientation, motor grossly intact               Skin: Skin is warm. No rashes, no other new lesions, LE edema - none               Psychiatric: Pt behavior is normal without agitation   Micro: none  Cardiac tracings I have personally interpreted today:  none  Pertinent Radiological findings (summarize): none   Lab Results  Component Value Date   WBC 8.6 03/20/2022   HGB 12.4 (L) 03/20/2022   HCT 36.8 (L) 03/20/2022   PLT 195.0 03/20/2022   GLUCOSE 63 (L) 03/20/2022   CHOL 162 03/20/2022   TRIG 103.0 03/20/2022   HDL 42.30 03/20/2022   LDLCALC 99 03/20/2022   ALT 17 03/20/2022   AST 22 03/20/2022   NA 137 03/20/2022   K 3.5 03/20/2022   CL 100 03/20/2022   CREATININE 1.31 03/20/2022   BUN 21 03/20/2022   CO2 29 03/20/2022   TSH 2.47 03/20/2022   PSA 3.04 07/07/2014   HGBA1C 6.5 03/20/2022   MICROALBUR <0.7 03/20/2022   Assessment/Plan:  Joseph Rocha is a 87 y.o. Black or African American [2] male with  has a past medical history of Allergy, Anemia, Diabetes mellitus, Elevated PSA, Hyperlipidemia, Hypertension, Hypogonadism male, OA (osteoarthritis), and Pituitary abnormality ([place]) (2004).  Vitamin D deficiency Last vitamin D Lab Results  Component Value Date   VD25OH 59.58 03/20/2022   Stable, cont oral replacement   Hypothyroidism Lab Results  Component Value Date   TSH 2.47 03/20/2022   Stable, pt to continue levothyroxine   HYPERCHOLESTEROLEMIA Lab Results  Component Value Date   [place] 99 03/20/2022   uncontrolled, pt to continue current statin crestor 5 as declines change   Diabetes ([place]) Lab Results  Component Value Date   HGBA1C 6.5 03/20/2022   Stable, pt to continue current medical treatment metformin , prandin, pt asks for new endo referral dr Buddy Duty as prior endo retired  Followup: Return in about 6 months (around 09/20/2022).  Cathlean Cower, MD 03/22/2022 7:35 PM [facility]

## 2022-03-20 NOTE — Patient Instructions (Signed)
Please continue all other medications as before, and refills have been done if requested. ? ?Please have the pharmacy call with any other refills you may need. ? ?Please continue your efforts at being more active, low cholesterol diet, and weight control. ? ?You are otherwise up to date with prevention measures today. ? ?Please keep your appointments with your specialists as you may have planned ? ?You will be contacted regarding the referral for: Dr Buddy Duty ? ?Please go to the LAB at the blood drawing area for the tests to be done ? ?You will be contacted by phone if any changes need to be made immediately.  Otherwise, you will receive a letter about your results with an explanation, but please check with MyChart first. ? ?Please remember to sign up for MyChart if you have not done so, as this will be important to you in the future with finding out test results, communicating by private email, and scheduling acute appointments online when needed. ? ?Please make an Appointment to return in 6 months, or sooner if needed ?

## 2022-03-21 LAB — URINALYSIS, ROUTINE W REFLEX MICROSCOPIC
Bilirubin Urine: NEGATIVE
Hgb urine dipstick: NEGATIVE
Ketones, ur: NEGATIVE
Leukocytes,Ua: NEGATIVE
Nitrite: NEGATIVE
Specific Gravity, Urine: 1.01 (ref 1.000–1.030)
Total Protein, Urine: NEGATIVE
Urine Glucose: NEGATIVE
Urobilinogen, UA: 0.2 (ref 0.0–1.0)
pH: 5.5 (ref 5.0–8.0)

## 2022-03-22 ENCOUNTER — Encounter: Payer: Self-pay | Admitting: Internal Medicine

## 2022-03-22 NOTE — Assessment & Plan Note (Signed)
Lab Results  Component Value Date   LDLCALC 99 03/20/2022   uncontrolled, pt to continue current statin crestor 5 as declines change

## 2022-03-22 NOTE — Assessment & Plan Note (Signed)
Lab Results  Component Value Date   TSH 2.47 03/20/2022   Stable, pt to continue levothyroxine

## 2022-03-22 NOTE — Assessment & Plan Note (Signed)
Last vitamin D Lab Results  Component Value Date   VD25OH 59.58 03/20/2022   Stable, cont oral replacement

## 2022-03-22 NOTE — Assessment & Plan Note (Addendum)
Lab Results  Component Value Date   HGBA1C 6.5 03/20/2022   Stable, pt to continue current medical treatment metformin , prandin, pt asks for new endo referral dr Buddy Duty as prior endo retired

## 2022-04-03 ENCOUNTER — Other Ambulatory Visit: Payer: Self-pay | Admitting: Internal Medicine

## 2022-04-03 NOTE — Telephone Encounter (Signed)
Please refill as per office routine med refill policy (all routine meds to be refilled for 3 mo or monthly (per pt preference) up to one year from last visit, then month to month grace period for 3 mo, then further med refills will have to be denied) ? ?

## 2022-04-16 ENCOUNTER — Ambulatory Visit (INDEPENDENT_AMBULATORY_CARE_PROVIDER_SITE_OTHER): Payer: Medicare Other | Admitting: Physician Assistant

## 2022-04-16 ENCOUNTER — Encounter: Payer: Self-pay | Admitting: Physician Assistant

## 2022-04-16 VITALS — BP 100/58 | HR 69 | Resp 16 | Ht 75.0 in | Wt 206.0 lb

## 2022-04-16 DIAGNOSIS — R142 Eructation: Secondary | ICD-10-CM | POA: Diagnosis not present

## 2022-04-16 DIAGNOSIS — R143 Flatulence: Secondary | ICD-10-CM | POA: Diagnosis not present

## 2022-04-16 DIAGNOSIS — K219 Gastro-esophageal reflux disease without esophagitis: Secondary | ICD-10-CM | POA: Diagnosis not present

## 2022-04-16 DIAGNOSIS — R140 Abdominal distension (gaseous): Secondary | ICD-10-CM | POA: Diagnosis not present

## 2022-04-16 DIAGNOSIS — R197 Diarrhea, unspecified: Secondary | ICD-10-CM

## 2022-04-16 MED ORDER — LOPERAMIDE HCL 2 MG PO TABS: 2.0000 mg | ORAL_TABLET | Freq: Two times a day (BID) | ORAL | 3 refills | Status: DC | PRN

## 2022-04-16 NOTE — Progress Notes (Signed)
Chief Complaint: Gas and bloating and diarrhea  HPI:    Joseph Rocha is an 87 year old male with a past medical history as listed below including diabetes, known to Dr. Fuller Plan, who returns to clinic today for follow-up of his gas bloating and diarrhea.    12/2010 colonoscopy with mild sigmoid colon diverticulosis and internal hemorrhoids.    11/14/2021 patient seen in clinic by Dr. Fuller Plan and at that time described some nausea as well as gas and belching.  He was advised to try lactose free diet and Gaviscon/Gas-X.  Also continued on Omeprazole 40 mg daily.    03/20/2022 CBC with a hemoglobin of 12.4 and otherwise normal, normal hepatic function panel and hemoglobin A1c as well as TSH.    Today, the patient tells me that he has continued with gas and bloating as well as loose stools.  He has tried stopping lactose for 2 weeks and it did not do anything.  He is using Gas-X and Gaviscon but continues with a lot of belching and gas.  Tells me that currently he will have diarrhea sometimes 1 or 2 loose stools in a day and take Loperamide 2 mg, 2 capsules after the first 1 and another after the next.  He then may not have diarrhea for couple of days and will go back to more solid stools but then the diarrhea will come back.  Patient is worried that something more severe is going on.  He did have recent labs by his PCP as above which were normal.    Denies fever, chills, weight loss, blood in his stool or abdominal pain.  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    Past Surgical History:  Procedure Laterality Date   DOPPLER ECHOCARDIOGRAPHY  12/04/2001   ELECTROCARDIOGRAM  03/25/2006   EP IMPLANTABLE DEVICE N/A 03/16/2016   Procedure: Loop Recorder Insertion;  Surgeon: Deboraha Sprang, MD;  Location: [facility];  Service:  Cardiovascular;  Laterality: N/A;   KNEE ARTHROSCOPY Left    LUMBAR DISC SURGERY     MENISCUS REPAIR Right may 2015   TONSILLECTOMY     TRANSPHENOIDAL / TRANSNASAL HYPOPHYSECTOMY / RESECTION PITUITARY TUMOR  2005    Current Outpatient Medications  Medication Sig Dispense Refill   aspirin EC 81 MG tablet Take 1 tablet (81 mg total) by mouth daily. Swallow whole. 30 tablet 11   calcium carbonate (OS-CAL) 600 MG TABS Take 600 mg by mouth daily.     cetirizine-pseudoephedrine (ZYRTEC-D) 5-120 MG tablet Take 1 tablet by mouth daily. 90 tablet 2   diphenoxylate-atropine (LOMOTIL) 2.5-0.025 MG tablet Take 1 tablet by mouth 4 (four) times daily as needed for diarrhea or loose stools. 30 tablet 1   finasteride (PROSCAR) 5 MG tablet TAKE 1 TABLET DAILY 90 tablet 1   fludrocortisone (FLORINEF) 0.1 MG tablet TAKE 1 TABLET DAILY 90 tablet 1   fluticasone (FLONASE) 50 MCG/ACT nasal spray Place 2 sprays into both nostrils daily. 16 g 6   folic acid (FOLVITE) 1 MG tablet TAKE 1 TABLET DAILY 90 tablet 3   IRON PO Take by mouth.     KLOR-CON M20 20 MEQ tablet Take 20 mEq by mouth daily.      levothyroxine (SYNTHROID) 50 MCG tablet  Take 1 tablet (50 mcg total) by mouth daily. 90 tablet 0   loperamide (IMODIUM A-D) 2 MG tablet Take 1 tablet (2 mg total) by mouth 2 (two) times daily as needed for diarrhea or loose stools. 30 tablet 0   metFORMIN (GLUCOPHAGE) 500 MG tablet TAKE 1 TABLET TWICE DAILY  WITH MEALS 180 tablet 3   methocarbamol (ROBAXIN) 500 MG tablet 1 tab by mouth at bedtime as needed 90 tablet 1   Multiple Vitamins-Minerals (CENTRUM SILVER PO) Take 1 tablet by mouth daily.     Omega-3 Fatty Acids (FISH OIL) 1000 MG CAPS Take 1 capsule by mouth daily.     omeprazole (PRILOSEC) 40 MG capsule TAKE 1 CAPSULE DAILY 90 capsule 1   ondansetron (ZOFRAN) 4 MG tablet Take 1 tablet (4 mg total) by mouth every 8 (eight) hours as needed for nausea or vomiting. 40 tablet 0   repaglinide (PRANDIN) 2 MG tablet  Take 1 tablet (2 mg total) by mouth 2 (two) times daily before a meal. 180 tablet 3   rosuvastatin (CRESTOR) 5 MG tablet TAKE 1 TABLET DAILY 90 tablet 3   Tamsulosin HCl (FLOMAX) 0.4 MG CAPS Take 0.4 mg by mouth daily.     Testosterone 30 MG/ACT SOLN Place 2 Pump onto the skin daily.     traMADol (ULTRAM) 50 MG tablet Take 1 tablet (50 mg total) by mouth every 6 (six) hours as needed. 15 tablet 0   triamcinolone (NASACORT) 55 MCG/ACT AERO nasal inhaler Place 2 sprays into the nose daily. 1 each 12   No current facility-administered medications for this visit.    Allergies as of 04/16/2022 - Review Complete 04/16/2022  Allergen Reaction Noted   Lipitor [atorvastatin] Other (See Comments) 03/02/2014    Family History  Problem Relation Age of Onset   Diabetes Mellitus II Mother        Deceased, 102s   Heart attack Father    Cancer Brother        uncertain type   Healthy Daughter    Colon cancer Neg Hx    Esophageal cancer Neg Hx    Liver cancer Neg Hx    Pancreatic cancer Neg Hx    Stomach cancer Neg Hx     Social History   Socioeconomic History   Marital status: Married    Spouse name: Not on file   Number of children: 2   Years of education: Not on file   Highest education level: Not on file  Occupational History   Occupation: retired    Fish farm manager: RETIRED  Tobacco Use   Smoking status: Never   Smokeless tobacco: Never  Vaping Use   Vaping Use: Never used  Substance and Sexual Activity   Alcohol use: No   Drug use: No   Sexual activity: Not Currently  Other Topics Concern   Not on file  Social History Narrative   Lives with wife.  They have two grown children.   He is retired from the [employer].   Social Determinants of Health   Financial Resource Strain: Low Risk  (01/16/2021)   Overall Financial Resource Strain (CARDIA)    Difficulty of Paying Living Expenses: Not hard at all  Food Insecurity: No Food Insecurity (01/16/2021)   Hunger Vital Sign     Worried About Running Out of Food in the Last Year: Never true    Ran Out of Food in the Last Year: Never true  Transportation Needs: No Transportation Needs (01/16/2021)  PRAPARE - Hydrologist (Medical): No    Lack of Transportation (Non-Medical): No  Physical Activity: Sufficiently Active (01/16/2021)   Exercise Vital Sign    Days of Exercise per Week: 5 days    Minutes of Exercise per Session: 30 min  Stress: No Stress Concern Present (01/16/2021)   [place]    Feeling of Stress : Not at all  Social Connections: [place] (01/16/2021)   Social Connection and Isolation Panel [NHANES]    Frequency of Communication with Friends and Family: More than three times a week    Frequency of Social Gatherings with Friends and Family: More than three times a week    Attends Religious Services: More than 4 times per year    Active Member of [employer] or Organizations: Yes    Attends Music therapist: More than 4 times per year    Marital Status: Married  Human resources officer Violence: Not on file    Review of Systems:    Constitutional: No weight loss, fever or chills Cardiovascular: No chest pain Respiratory: No SOB Gastrointestinal: See HPI and otherwise negative   Physical Exam:  Vital signs: BP (!) 100/58 (BP Location: Right Arm, Patient Position: Sitting, Cuff Size: Normal)   Pulse 69   Resp 16   Ht '6\' 3"'$  (1.905 m)   Wt 206 lb (93.4 kg)   SpO2 95%   BMI 25.75 kg/m   Constitutional:   Pleasant male appears to be in NAD, Well developed, Well nourished, alert and cooperative Respiratory: Respirations even and unlabored. Lungs clear to auscultation bilaterally.   No wheezes, crackles, or rhonchi.  Cardiovascular: Normal S1, S2. No MRG. Regular rate and rhythm. No peripheral edema, cyanosis or pallor.  Gastrointestinal:  Soft, nondistended, nontender. No rebound or guarding.  Normal bowel sounds. No appreciable masses or hepatomegaly. Rectal:  Not performed.  Psychiatric: Oriented to person, place and time. Demonstrates good judgement and reason without abnormal affect or behaviors.  RELEVANT LABS AND IMAGING: CBC    Component Value Date/Time   WBC 8.6 03/20/2022 1615   RBC 4.03 (L) 03/20/2022 1615   HGB 12.4 (L) 03/20/2022 1615   HCT 36.8 (L) 03/20/2022 1615   PLT 195.0 03/20/2022 1615   MCV 91.2 03/20/2022 1615   MCH 30.7 07/13/2020 1108   MCHC 33.8 03/20/2022 1615   RDW 13.8 03/20/2022 1615   LYMPHSABS 3.6 03/20/2022 1615   MONOABS 0.9 03/20/2022 1615   EOSABS 0.3 03/20/2022 1615   BASOSABS 0.0 03/20/2022 1615    CMP     Component Value Date/Time   NA 137 03/20/2022 1615   K 3.5 03/20/2022 1615   CL 100 03/20/2022 1615   CO2 29 03/20/2022 1615   GLUCOSE 63 (L) 03/20/2022 1615   BUN 21 03/20/2022 1615   CREATININE 1.31 03/20/2022 1615   CREATININE 1.43 (H) 07/13/2020 1107   CALCIUM 9.2 03/20/2022 1615   CALCIUM 9.1 02/25/2008 0340   PROT 7.1 03/20/2022 1615   ALBUMIN 4.2 03/20/2022 1615   AST 22 03/20/2022 1615   ALT 17 03/20/2022 1615   ALKPHOS 49 03/20/2022 1615   BILITOT 0.5 03/20/2022 1615   GFRNONAA 45 (L) 07/13/2020 1107   GFRAA 52 (L) 07/13/2020 1107    Assessment: 1.  Gas/bloating and diarrhea: No better after lactose-free diet for 2 weeks, recent labs as above with a minimally decreased hemoglobin and normal TSH/hepatic function panel; consider IBS versus diet versus  SIBO  Plan: 1.  Ordered SIBO breath testing 2.  Patient asked about having a CT or MRI, I do not think this would be beneficial he is not having any abdominal pain, rectal bleeding or weight loss 3.  Refilled Loperamide 2 mg capsules as needed #30 with 2 refills 4.  Recommend the patient start a fiber supplement such as Benefiber once daily 5.  Patient to follow in clinic after SIBO testing per recommendations  Ellouise Newer, PA-C [facility] 04/16/2022, 2:56 PM  Cc: Biagio Borg, MD

## 2022-04-16 NOTE — Patient Instructions (Signed)
Start Benefiber 2 teaspoons in 8 ounces of liquid daily.  Refilled Loperamide.   You have been given a testing kit to check for small intestine bacterial overgrowth (SIBO) which is completed by a company named [facility]. Make sure to return your test in the mail using the return mailing label given to you along with the kit. Your demographic and insurance information have already been sent to the company and they should be in contact with you over the next 1-2 weeks regarding this test. [facility] will collect an upfront charge of $99.74 for commercial insurance plans and $209.74 is you are paying cash. Make sure to discuss with [facility] PRIOR to having the test to see if they have gotten information from your insurance company as to how much your testing will cost out of pocket, if any. Please keep in mind that you will be getting a call from phone number [phone] or a similar number. If you do not hear from them within this time frame, please call our office at [phone] or call [facility] directly at [phone].   If you are age 79 or older, your body mass index should be between 23-30. Your Body mass index is 25.75 kg/m. If this is out of the aforementioned range listed, please consider follow up with your Primary Care Provider.  If you are age 80 or younger, your body mass index should be between 19-25. Your Body mass index is 25.75 kg/m. If this is out of the aformentioned range listed, please consider follow up with your Primary Care Provider.   ________________________________________________________  The [facility] providers would like to encourage you to use [facility] to communicate with providers for non-urgent requests or questions.  Due to long hold times on the telephone, sending your provider a message by [facility] may be a faster and more efficient way to get a response.  Please allow 48 business hours for a response.  Please remember that this is for  non-urgent requests.  _______________________________________________________

## 2022-04-27 ENCOUNTER — Telehealth: Payer: Self-pay | Admitting: Physician Assistant

## 2022-04-27 NOTE — Telephone Encounter (Signed)
Patient called asked to speak with a nurse regarding a code he needs to verify with his insurance the cost for a SIBO test.

## 2022-04-27 NOTE — Telephone Encounter (Signed)
Returned call to patient. He stated that his insurance needed the diagnoses codes for the SIBO test that was ordered. I provided pt with the diagnoses codes of R14.0, R14.2, and R14.3. Pt verbalized understanding and had no concerns at the end of the call.

## 2022-05-03 DIAGNOSIS — R140 Abdominal distension (gaseous): Secondary | ICD-10-CM | POA: Diagnosis not present

## 2022-05-15 ENCOUNTER — Ambulatory Visit: Payer: Medicare Other | Admitting: Gastroenterology

## 2022-05-22 ENCOUNTER — Telehealth: Payer: Self-pay

## 2022-05-22 ENCOUNTER — Other Ambulatory Visit: Payer: Self-pay | Admitting: Physician Assistant

## 2022-05-22 NOTE — Telephone Encounter (Signed)
-----   Message from Levin Erp, [place] sent at 05/22/2022  8:54 AM EDT ----- Regarding: SIBO Please let patient know that his SIBO breath testing was positive.  Lets treat with Xifaxan 550 3 times daily x2 weeks.  I believe he has an upcoming appointment with Dr. Fuller Plan, not sure if this is 2 weeks from now, if it is not patient may benefit from scooting this back a bit if able.  Otherwise he can see Dr. Fuller Plan in the interim.  Thanks, JLL

## 2022-05-22 NOTE — Telephone Encounter (Signed)
Attempted to reach patient on his home phone. His phone line just rings, no prompt to leave a vm.   Lm on mobile vm for patient to return call.

## 2022-05-22 NOTE — Progress Notes (Signed)
[facility]  SIBO breath testing results showed bacterial overgrowth with suspected  We will treat patient with Xifaxan 500 mg 3 times daily x14 days.  Ellouise Newer, PA-C

## 2022-05-23 MED ORDER — RIFAXIMIN 550 MG PO TABS: 550.0000 mg | ORAL_TABLET | Freq: Three times a day (TID) | ORAL | 0 refills | Status: AC

## 2022-05-23 NOTE — Telephone Encounter (Signed)
Pt called in today. He states that he went to pick up Rifaximin and it is requiring a PA. I told pt that I will send a message to the PA team to work on this. Pt verbalized understanding and had no concerns at the end of the call.

## 2022-05-23 NOTE — Telephone Encounter (Signed)
Called and spoke with patient regarding his SIBO results and recommendations. Pt would like RX sent to [facility] on file. Pt has been advised to keep his f/u appt as scheduled with Dr. Fuller Plan. Pt verbalized understanding and had no concerns at the end of the call.

## 2022-05-24 ENCOUNTER — Telehealth: Payer: Self-pay | Admitting: Pharmacy Technician

## 2022-05-24 NOTE — Telephone Encounter (Signed)
Patient Advocate Encounter  Received notification from [place] that prior authorization for XIFAXAN '550MG'$  is required.   PA submitted on 7.20.23 (EXPEDITED)  Key [id number] Status is pending    Luciano Cutter, CPhT Patient Advocate Phone: [phone]

## 2022-05-25 ENCOUNTER — Other Ambulatory Visit: Payer: Self-pay | Admitting: Internal Medicine

## 2022-05-25 DIAGNOSIS — E119 Type 2 diabetes mellitus without complications: Secondary | ICD-10-CM

## 2022-05-28 NOTE — Telephone Encounter (Signed)
Pt said he spoke with covermymeds and they are stating the order is incomplete and requesting a call @ [phone]

## 2022-05-28 NOTE — Telephone Encounter (Signed)
Patient called today checking on the status of the prior authorization for the Xifaxan.  He has an appointment with Dr. Fuller Plan on 7/31 and he wanted the patient to have the medication in his system before coming to the appointment so he would know if it was helping him.  If this medication cannot be gotten in time, is there another similar medication he can take?  Please call patient and advise.  Thank you.

## 2022-05-29 NOTE — Telephone Encounter (Signed)
Noted! Thank you

## 2022-05-29 NOTE — Telephone Encounter (Signed)
[place], is there any update on this PA? Thanks

## 2022-05-29 NOTE — Telephone Encounter (Signed)
Patient Advocate Encounter  Prior Authorization for XIFAXAN '550MG'$  has been approved.    PA# [id number] Effective dates: 07.25.23 through 8.8.23  Izyan Ezzell B. CPhT P: [phone] F: [phone]    Received notification from [place] that prior authorization for [facility] '550MG'$  is required.   PA submitted on 7.25.23 Key [id number] Status is pending    Luciano Cutter, CPhT Patient Advocate Phone: [phone]

## 2022-05-29 NOTE — Telephone Encounter (Signed)
Patient Advocate Encounter  Received a fax regarding Prior Authorization from [place] for Xifaxan '550MG'$ .   Authorization has been DENIED due to not meeting plan requirements. This plan covers Xifaxan when used for IBS-D or when used to lower the risk of hepatic encephalopathy.  Determination letter added to patient chart.  Clista Bernhardt, CPhT Pharmacy Patient Advocate Specialist [place] Patient Advocate Team Phone: [phone]   Fax: [phone]

## 2022-05-30 ENCOUNTER — Ambulatory Visit: Payer: Medicare Other | Admitting: Gastroenterology

## 2022-06-04 ENCOUNTER — Ambulatory Visit: Payer: Medicare Other | Admitting: Gastroenterology

## 2022-06-08 ENCOUNTER — Ambulatory Visit (INDEPENDENT_AMBULATORY_CARE_PROVIDER_SITE_OTHER): Payer: Medicare Other

## 2022-06-08 DIAGNOSIS — Z0000 Encounter for general adult medical examination without abnormal findings: Secondary | ICD-10-CM | POA: Diagnosis not present

## 2022-06-08 NOTE — Patient Instructions (Signed)
Joseph Rocha , Thank you for taking time to come for your Medicare Wellness Visit. I appreciate your ongoing commitment to your health goals. Please review the following plan we discussed and let me know if I can assist you in the future.   Screening recommendations/referrals: Colonoscopy: no longer required  Recommended yearly ophthalmology/optometry visit for glaucoma screening and checkup Recommended yearly dental visit for hygiene and checkup  Vaccinations: Influenza vaccine: completed  Pneumococcal vaccine: completed  Tdap vaccine: 07/26/2014 Shingles vaccine: will consider     Advanced directives: yes   Conditions/risks identified: none   Next appointment: none   Preventive Care 85 Years and Older, Male Preventive care refers to lifestyle choices and visits with your health care provider that can promote health and wellness. What does preventive care include? A yearly physical exam. This is also called an annual well check. Dental exams once or twice a year. Routine eye exams. Ask your health care provider how often you should have your eyes checked. Personal lifestyle choices, including: Daily care of your teeth and gums. Regular physical activity. Eating a healthy diet. Avoiding tobacco and drug use. Limiting alcohol use. Practicing safe sex. Taking low doses of aspirin every day. Taking vitamin and mineral supplements as recommended by your health care provider. What happens during an annual well check? The services and screenings done by your health care provider during your annual well check will depend on your age, overall health, lifestyle risk factors, and family history of disease. Counseling  Your health care provider may ask you questions about your: Alcohol use. Tobacco use. Drug use. Emotional well-being. Home and relationship well-being. Sexual activity. Eating habits. History of falls. Memory and ability to understand (cognition). Work and work  Statistician. Screening  You may have the following tests or measurements: Height, weight, and BMI. Blood pressure. Lipid and cholesterol levels. These may be checked every 5 years, or more frequently if you are over 54 years old. Skin check. Lung cancer screening. You may have this screening every year starting at age 37 if you have a 30-pack-year history of smoking and currently smoke or have quit within the past 15 years. Fecal occult blood test (FOBT) of the stool. You may have this test every year starting at age 60. Flexible sigmoidoscopy or colonoscopy. You may have a sigmoidoscopy every 5 years or a colonoscopy every 10 years starting at age 55. Prostate cancer screening. Recommendations will vary depending on your family history and other risks. Hepatitis C blood test. Hepatitis B blood test. Sexually transmitted disease (STD) testing. Diabetes screening. This is done by checking your blood sugar (glucose) after you have not eaten for a while (fasting). You may have this done every 1-3 years. Abdominal aortic aneurysm (AAA) screening. You may need this if you are a current or former smoker. Osteoporosis. You may be screened starting at age 16 if you are at high risk. Talk with your health care provider about your test results, treatment options, and if necessary, the need for more tests. Vaccines  Your health care provider may recommend certain vaccines, such as: Influenza vaccine. This is recommended every year. Tetanus, diphtheria, and acellular pertussis (Tdap, Td) vaccine. You may need a Td booster every 10 years. Zoster vaccine. You may need this after age 27. Pneumococcal 13-valent conjugate (PCV13) vaccine. One dose is recommended after age 39. Pneumococcal polysaccharide (PPSV23) vaccine. One dose is recommended after age 41. Talk to your health care provider about which screenings and vaccines you need and  how often you need them. This information is not intended to replace  advice given to you by your health care provider. Make sure you discuss any questions you have with your health care provider. Document Released: 11/18/2015 Document Revised: 07/11/2016 Document Reviewed: 08/23/2015 Elsevier Interactive Patient Education  2017 [place] Prevention in the Home Falls can cause injuries. They can happen to people of all ages. There are many things you can do to make your home safe and to help prevent falls. What can I do on the outside of my home? Regularly fix the edges of walkways and driveways and fix any cracks. Remove anything that might make you trip as you walk through a door, such as a raised step or threshold. Trim any bushes or trees on the path to your home. Use bright outdoor lighting. Clear any walking paths of anything that might make someone trip, such as rocks or tools. Regularly check to see if handrails are loose or broken. Make sure that both sides of any steps have handrails. Any raised decks and porches should have guardrails on the edges. Have any leaves, snow, or ice cleared regularly. Use sand or salt on walking paths during winter. Clean up any spills in your garage right away. This includes oil or grease spills. What can I do in the bathroom? Use night lights. Install grab bars by the toilet and in the tub and shower. Do not use towel bars as grab bars. Use non-skid mats or decals in the tub or shower. If you need to sit down in the shower, use a plastic, non-slip stool. Keep the floor dry. Clean up any water that spills on the floor as soon as it happens. Remove soap buildup in the tub or shower regularly. Attach bath mats securely with double-sided non-slip rug tape. Do not have throw rugs and other things on the floor that can make you trip. What can I do in the bedroom? Use night lights. Make sure that you have a light by your bed that is easy to reach. Do not use any sheets or blankets that are too big for your bed.  They should not hang down onto the floor. Have a firm chair that has side arms. You can use this for support while you get dressed. Do not have throw rugs and other things on the floor that can make you trip. What can I do in the kitchen? Clean up any spills right away. Avoid walking on wet floors. Keep items that you use a lot in easy-to-reach places. If you need to reach something above you, use a strong step stool that has a grab bar. Keep electrical cords out of the way. Do not use floor polish or wax that makes floors slippery. If you must use wax, use non-skid floor wax. Do not have throw rugs and other things on the floor that can make you trip. What can I do with my stairs? Do not leave any items on the stairs. Make sure that there are handrails on both sides of the stairs and use them. Fix handrails that are broken or loose. Make sure that handrails are as long as the stairways. Check any carpeting to make sure that it is firmly attached to the stairs. Fix any carpet that is loose or worn. Avoid having throw rugs at the top or bottom of the stairs. If you do have throw rugs, attach them to the floor with carpet tape. Make sure that you have  a light switch at the top of the stairs and the bottom of the stairs. If you do not have them, ask someone to add them for you. What else can I do to help prevent falls? Wear shoes that: Do not have high heels. Have rubber bottoms. Are comfortable and fit you well. Are closed at the toe. Do not wear sandals. If you use a stepladder: Make sure that it is fully opened. Do not climb a closed stepladder. Make sure that both sides of the stepladder are locked into place. Ask someone to hold it for you, if possible. Clearly mark and make sure that you can see: Any grab bars or handrails. First and last steps. Where the edge of each step is. Use tools that help you move around (mobility aids) if they are needed. These  include: Canes. Walkers. Scooters. Crutches. Turn on the lights when you go into a dark area. Replace any light bulbs as soon as they burn out. Set up your furniture so you have a clear path. Avoid moving your furniture around. If any of your floors are uneven, fix them. If there are any pets around you, be aware of where they are. Review your medicines with your doctor. Some medicines can make you feel dizzy. This can increase your chance of falling. Ask your doctor what other things that you can do to help prevent falls. This information is not intended to replace advice given to you by your health care provider. Make sure you discuss any questions you have with your health care provider. Document Released: 08/18/2009 Document Revised: 03/29/2016 Document Reviewed: 11/26/2014 Elsevier Interactive Patient Education  2017 [employer].

## 2022-06-08 NOTE — Progress Notes (Signed)
Subjective:   Joseph Rocha is a 87 y.o. male who presents for an Subsequent  Medicare Annual Wellness Visit.   I connected with Joseph Rocha  today by telephone and verified that I am speaking with the correct person using two identifiers. Location patient: home Location provider: work Persons participating in the virtual visit: patient, provider.   I discussed the limitations, risks, security and privacy concerns of performing an evaluation and management service by telephone and the availability of in person appointments. I also discussed with the patient that there may be a patient responsible charge related to this service. The patient expressed understanding and verbally consented to this telephonic visit.    Interactive audio and video telecommunications were attempted between this provider and patient, however failed, due to patient having technical difficulties OR patient did not have access to video capability.  We continued and completed visit with audio only.    Review of Systems     Cardiac Risk Factors include: advanced age (>78mn, >>92women);diabetes mellitus;male gender     Objective:    Today's Vitals   There is no height or weight on file to calculate BMI.     06/08/2022    2:19 PM 01/16/2021    2:32 PM 10/19/2018    9:25 AM 08/16/2017    3:26 PM 04/22/2017   12:11 PM 03/16/2016    7:03 AM 07/27/2015    4:31 PM  Advanced Directives  Does Patient Have a Medical Advance Directive? Yes No Yes No No Yes No  Type of AParamed[place]of [place] will  [place]     Does patient want to make changes to medical advance directive?      No - Patient declined   Copy of [place] Chart? No - copy requested        Would patient like information on creating a medical advance directive?  Yes (MAU/Ambulatory/Procedural Areas - Information given)         Current Medications (verified) Outpatient Encounter  Medications as of 06/08/2022  Medication Sig   aspirin EC 81 MG tablet Take 1 tablet (81 mg total) by mouth daily. Swallow whole.   calcium carbonate (OS-CAL) 600 MG TABS Take 600 mg by mouth daily.   cetirizine-pseudoephedrine (ZYRTEC-D) 5-120 MG tablet Take 1 tablet by mouth daily.   diphenoxylate-atropine (LOMOTIL) 2.5-0.025 MG tablet Take 1 tablet by mouth 4 (four) times daily as needed for diarrhea or loose stools.   finasteride (PROSCAR) 5 MG tablet TAKE 1 TABLET DAILY   fludrocortisone (FLORINEF) 0.1 MG tablet TAKE 1 TABLET DAILY   fluticasone (FLONASE) 50 MCG/ACT nasal spray Place 2 sprays into both nostrils daily.   folic acid (FOLVITE) 1 MG tablet TAKE 1 TABLET DAILY   IRON PO Take by mouth.   KLOR-CON M20 20 MEQ tablet Take 20 mEq by mouth daily.    levothyroxine (SYNTHROID) 50 MCG tablet Take 1 tablet (50 mcg total) by mouth daily.   loperamide (IMODIUM A-D) 2 MG tablet Take 1 tablet (2 mg total) by mouth 2 (two) times daily as needed for diarrhea or loose stools.   metFORMIN (GLUCOPHAGE) 500 MG tablet TAKE 1 TABLET TWICE DAILY  WITH MEALS   methocarbamol (ROBAXIN) 500 MG tablet 1 tab by mouth at bedtime as needed   Multiple Vitamins-Minerals (CENTRUM SILVER PO) Take 1 tablet by mouth daily.   Omega-3 Fatty Acids (FISH OIL) 1000 MG CAPS Take 1 capsule by  mouth daily.   omeprazole (PRILOSEC) 40 MG capsule TAKE 1 CAPSULE DAILY   ondansetron (ZOFRAN) 4 MG tablet Take 1 tablet (4 mg total) by mouth every 8 (eight) hours as needed for nausea or vomiting.   repaglinide (PRANDIN) 2 MG tablet Take 1 tablet (2 mg total) by mouth 2 (two) times daily before a meal.   rosuvastatin (CRESTOR) 5 MG tablet TAKE 1 TABLET DAILY   Tamsulosin HCl (FLOMAX) 0.4 MG CAPS Take 0.4 mg by mouth daily.   Testosterone 30 MG/ACT SOLN Place 2 Pump onto the skin daily.   traMADol (ULTRAM) 50 MG tablet Take 1 tablet (50 mg total) by mouth every 6 (six) hours as needed.   triamcinolone (NASACORT) 55 MCG/ACT AERO  nasal inhaler Place 2 sprays into the nose daily.   No facility-administered encounter medications on file as of 06/08/2022.    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   Past Surgical History:  Procedure Laterality Date   DOPPLER ECHOCARDIOGRAPHY  12/04/2001   ELECTROCARDIOGRAM  03/25/2006   EP IMPLANTABLE DEVICE N/A 03/16/2016   Procedure: Loop Recorder Insertion;  Surgeon: Deboraha Sprang, MD;  Location: [facility];  Service: Cardiovascular;  Laterality: N/A;   KNEE ARTHROSCOPY Left    LUMBAR DISC SURGERY     MENISCUS REPAIR Right may 2015   TONSILLECTOMY     TRANSPHENOIDAL / TRANSNASAL HYPOPHYSECTOMY / RESECTION PITUITARY TUMOR  2005   Family History  Problem Relation Age of Onset   Diabetes Mellitus II Mother        Deceased, 6s   Heart attack Father    Cancer Brother        uncertain type   Healthy Daughter    Colon cancer Neg Hx    Esophageal cancer Neg Hx    Liver cancer Neg Hx    Pancreatic cancer Neg Hx    Stomach cancer Neg Hx    Social History   Socioeconomic History   Marital status: Married    Spouse name: Not on file   Number of children: 2   Years of education: Not on file   Highest education level: Not on file  Occupational History   Occupation: retired    Fish farm manager: RETIRED  Tobacco Use   Smoking status: Never   Smokeless tobacco: Never  Vaping Use   Vaping Use: Never used  Substance and Sexual Activity   Alcohol use: No   Drug use: No   Sexual activity: Not Currently  Other Topics Concern   Not on file  Social History Narrative   Lives with wife.  They have two grown children.   He is retired from the [employer].   Social Determinants of Health   Financial Resource Strain: Low Risk  (06/08/2022)    Overall Financial Resource Strain (CARDIA)    Difficulty of Paying Living Expenses: Not hard at all  Food Insecurity: No Food Insecurity (06/08/2022)   Hunger Vital Sign    Worried About Running Out of Food in the Last Year: Never true    Ran Out of Food in the Last Year: Never true  Transportation Needs: No Transportation Needs (06/08/2022)   PRAPARE - Transportation    Lack of  Transportation (Medical): No    Lack of Transportation (Non-Medical): No  Physical Activity: Inactive (06/08/2022)   Exercise Vital Sign    Days of Exercise per Week: 0 days    Minutes of Exercise per Session: 0 min  Stress: No Stress Concern Present (06/08/2022)   [place]    Feeling of Stress : Not at all  Social Connections: [place] (06/08/2022)   Social Connection and Isolation Panel [NHANES]    Frequency of Communication with Friends and Family: Three times a week    Frequency of Social Gatherings with Friends and Family: Three times a week    Attends Religious Services: More than 4 times per year    Active Member of Clubs or Organizations: Yes    Attends Music therapist: More than 4 times per year    Marital Status: Married    Tobacco Counseling Counseling given: Not Answered   Clinical Intake:  Pre-visit preparation completed: Yes  Pain : No/denies pain     Nutritional Risks: None Diabetes: No  How often do you need to have someone help you when you read instructions, pamphlets, or other written materials from your doctor or pharmacy?: 1 - Never What is the last grade level you completed in school?: [employer]  Diabetic?yes Nutrition Risk Assessment:  Has the patient had any N/V/D within the last 2 months?  No  Does the patient have any non-healing wounds?  No  Has the patient had any unintentional weight loss or weight gain?  No   Diabetes:  Is the patient diabetic?  Yes  If diabetic, was a CBG  obtained today?  No  Did the patient bring in their glucometer from home?  No  How often do you monitor your CBG's? Daily .   Financial Strains and Diabetes Management:  Are you having any financial strains with the device, your supplies or your medication? No .  Does the patient want to be seen by Chronic Care Management for management of their diabetes?  No  Would the patient like to be referred to a Nutritionist or for Diabetic Management?  No   Diabetic Exams:  Diabetic Eye Exam: Completed 11/2021 Diabetic Foot Exam: Overdue, Pt has been advised about the importance in completing this exam. Pt is scheduled for diabetic foot exam on next office visit .   Interpreter Needed?: No  Information entered by :: L.Wilson,LPN   Activities of Daily Living    06/08/2022    2:21 PM  In your present state of health, do you have any difficulty performing the following activities:  Hearing? 0  Vision? 0  Difficulty concentrating or making decisions? 0  Walking or climbing stairs? 0  Dressing or bathing? 0  Doing errands, shopping? 0  Preparing Food and eating ? N  Using the Toilet? N  In the past six months, have you accidently leaked urine? N  Do you have problems with loss of bowel control? N  Managing your Medications? N  Managing your Finances? N  Housekeeping or managing your Housekeeping? N    Patient Care Team: Biagio Borg, MD as PCP - General (Internal Medicine) Carolan Clines, MD (Inactive) as Attending Physician (Urology) Belva Crome, MD as Attending Physician (Cardiology) Erline Levine, MD as Attending Physician (Neurosurgery) Harold Hedge, Darrick Grinder, MD (Allergy and Immunology)  Indicate any recent Medical Services you may have received from other than [facility] providers in the past year (date may  be approximate).     Assessment:   This is a routine wellness examination for [place].  Hearing/Vision screen Vision Screening - Comments:: Annual eye exams wear  glasses   Dietary issues and exercise activities discussed: Current Exercise Habits: The patient does not participate in regular exercise at present, Exercise limited by: None identified   Goals Addressed   None    Depression Screen    06/08/2022    2:21 PM 06/08/2022    2:15 PM 03/20/2022    3:48 PM 03/20/2022    3:14 PM 12/21/2021    3:13 PM 01/16/2021    3:07 PM 01/16/2021    2:31 PM  PHQ 2/9 Scores  PHQ - 2 Score 0 0 0 0 0 0 0  PHQ- 9 Score    0       Fall Risk    06/08/2022    2:19 PM 03/20/2022    3:48 PM 03/20/2022    3:13 PM 12/21/2021    3:13 PM 01/16/2021    3:07 PM  [place] in the past year? 0 0 0 1 0  Number falls in past yr: 0 0 0 1   Injury with Fall? 0 0 0 0   Follow up Falls evaluation completed;Education provided      Comment uses cane        FALL RISK PREVENTION PERTAINING TO THE HOME:  Any stairs in or around the home? Yes  If so, are there any without handrails? No  Home free of loose throw rugs in walkways, pet beds, electrical cords, etc? Yes  Adequate lighting in your home to reduce risk of falls? Yes   ASSISTIVE DEVICES UTILIZED TO PREVENT FALLS:  Life alert? No  Use of a cane, walker or w/c? Yes  Grab bars in the bathroom? Yes  Shower chair or bench in shower? Yes  Elevated toilet seat or a handicapped toilet? Yes       06/08/2022    2:21 PM  6CIT Screen  What Year? 0 points  What month? 0 points  What time? 0 points  Count back from 20 2 points  Months in reverse 2 points  Repeat phrase 0 points  Total Score 4 points  Normal cognitive status assessed by telephone conversation  by this Nurse Health Advisor. No abnormalities found.     Immunizations Immunization History  Administered Date(s) Administered   Influenza, High Dose Seasonal PF 08/16/2017   Influenza,inj,Quad PF,6+ Mos 09/07/2013, 07/07/2014, 08/15/2016, 08/02/2018, 08/29/2019   PFIZER(Purple Top)SARS-COV-2 Vaccination 11/16/2019, 12/07/2019   Pneumococcal  Conjugate-13 07/07/2014   Pneumococcal Polysaccharide-23 09/01/2008   Tdap 07/07/2014    TDAP status: Up to date  Flu Vaccine status: Up to date  Pneumococcal vaccine status: Up to date  Covid-19 vaccine status: Completed vaccines  Qualifies for Shingles Vaccine? Yes   Zostavax completed Yes   Shingrix Completed?: No.    Education has been provided regarding the importance of this vaccine. Patient has been advised to call insurance company to determine out of pocket expense if they have not yet received this vaccine. Advised may also receive vaccine at local pharmacy or Health Dept. Verbalized acceptance and understanding.  Screening Tests Health Maintenance  Topic Date Due   COVID-19 Vaccine (4 - Booster for Pfizer series) 01/12/2021   INFLUENZA VACCINE  06/05/2022   Zoster Vaccines- Shingrix (1 of 2) 06/20/2022 (Originally 02/01/1954)   HEMOGLOBIN A1C  09/20/2022   OPHTHALMOLOGY EXAM  01/09/2023   Diabetic kidney  evaluation - GFR measurement  03/21/2023   Diabetic kidney evaluation - Urine ACR  03/21/2023   FOOT EXAM  03/21/2023   TETANUS/TDAP  07/07/2024   Pneumonia Vaccine 33+ Years old  Completed   HPV VACCINES  Aged Out    Health Maintenance  Health Maintenance Due  Topic Date Due   COVID-19 Vaccine (4 - Booster for Pfizer series) 01/12/2021   INFLUENZA VACCINE  06/05/2022    Colorectal cancer screening: No longer required.   Lung Cancer Screening: (Low Dose CT Chest recommended if Age 78-80 years, 30 pack-year currently smoking OR have quit w/in 15years.) does not qualify.   Lung Cancer Screening Referral: n/a  Additional Screening:  Hepatitis C Screening: does not qualify;   Vision Screening: Recommended annual ophthalmology exams for early detection of glaucoma and other disorders of the eye. Is the patient up to date with their annual eye exam?  Yes  Who is the provider or what is the name of the office in which the patient attends annual eye exams?  Dr.Gould  If pt is not established with a provider, would they like to be referred to a provider to establish care? No .   Dental Screening: Recommended annual dental exams for proper oral hygiene  Community Resource Referral / Chronic Care Management: CRR required this visit?  No   CCM required this visit?  No      Plan:     I have personally reviewed and noted the following in the patient's chart:   Medical and social history Use of alcohol, tobacco or illicit drugs  Current medications and supplements including opioid prescriptions. Patient is not currently taking opioid prescriptions. Functional ability and status Nutritional status Physical activity Advanced directives List of other physicians Hospitalizations, surgeries, and ER visits in previous 12 months Vitals Screenings to include cognitive, depression, and falls Referrals and appointments  In addition, I have reviewed and discussed with patient certain preventive protocols, quality metrics, and best practice recommendations. A written personalized care plan for preventive services as well as general preventive health recommendations were provided to patient.     Daphane Shepherd, LPN   8/0/9983   Nurse Notes: none

## 2022-07-04 ENCOUNTER — Ambulatory Visit (INDEPENDENT_AMBULATORY_CARE_PROVIDER_SITE_OTHER): Payer: Medicare Other | Admitting: Gastroenterology

## 2022-07-04 ENCOUNTER — Encounter: Payer: Self-pay | Admitting: Gastroenterology

## 2022-07-04 VITALS — BP 110/68 | HR 73 | Ht 75.0 in | Wt 207.2 lb

## 2022-07-04 DIAGNOSIS — K6389 Other specified diseases of intestine: Secondary | ICD-10-CM | POA: Diagnosis not present

## 2022-07-04 DIAGNOSIS — K219 Gastro-esophageal reflux disease without esophagitis: Secondary | ICD-10-CM

## 2022-07-04 NOTE — Patient Instructions (Signed)
You can stop taking your fiber supplement and your probiotic if needed.   Follow up with [place] as needed and follow up with Dr. Jenny Reichmann.  The [facility] providers would like to encourage you to use [facility] to communicate with providers for non-urgent requests or questions.  Due to long hold times on the telephone, sending your provider a message by [facility] may be a faster and more efficient way to get a response.  Please allow 48 business hours for a response.  Please remember that this is for non-urgent requests.   Thank you for choosing me and [facility].  Pricilla Riffle. Dagoberto Ligas., MD., Marval Regal

## 2022-07-04 NOTE — Progress Notes (Signed)
    Assessment     SIBO, symptoms resolved  GERD   Recommendations    Continue omeprazole 40 mg daily and follow antireflux measures Discontinue Align, fiber supplement and assess symptoms.  If either medication improves his symptoms then continue, otherwise discontinue Return to PCP for ongoing care, GI follow-up as needed   HPI    This is an 87 year old male with abdominal bloating and diarrhea who was diagnosed with SIBO in late June and treated with a course of Xifaxan in late July.  His abdominal bloating and diarrhea completely resolved after completing Xifaxan.  He takes a fiber supplement and Align probiotic and wonders if he still needs them.  His reflux symptoms are well controlled   Labs / Imaging       Latest Ref Rng & Units 03/20/2022    4:15 PM 11/14/2021   10:33 AM 03/24/2021    2:03 PM  Hepatic Function  Total Protein 6.0 - 8.3 g/dL 7.1  6.9  7.3   Albumin 3.5 - 5.2 g/dL 4.2  4.0  4.1   AST 0 - 37 U/L _0 ALT 0 - 53 U/L _1 Alk Phosphatase 39 - 117 U/L 49  51  58   Total Bilirubin 0.2 - 1.2 mg/dL 0.5  0.7  0.6   Bilirubin, Direct 0.0 - 0.3 mg/dL 0.1   0.1        Latest Ref Rng & Units 03/20/2022    4:15 PM 11/14/2021   10:33 AM 03/24/2021    2:03 PM  CBC  WBC 4.0 - 10.5 K/uL 8.6  5.2  6.1   Hemoglobin 13.0 - 17.0 g/dL 12.4  12.5  13.8   Hematocrit 39.0 - 52.0 % 36.8  37.2  40.4   Platelets 150.0 - 400.0 K/uL 195.0  193.0  206.0     Current Medications, Allergies, Past Medical History, Past Surgical History, Family History and Social History were reviewed in [employer] record.   Physical Exam: General: Well developed, well nourished, elderly, no acute distress Head: Normocephalic and atraumatic Eyes: Sclerae anicteric, EOMI Ears: Normal auditory acuity Mouth: Not examined Lungs: Clear throughout to auscultation Heart: Regular rate and rhythm; no murmurs, rubs or bruits Abdomen: Soft, non tender and non  distended. No masses, hepatosplenomegaly or hernias noted. Normal Bowel sounds Rectal: Not done Musculoskeletal: Symmetrical with no gross deformities  Pulses:  Normal pulses noted Extremities: No clubbing, cyanosis, edema or deformities noted Neurological: Alert oriented x 4, grossly nonfocal Psychological:  Alert and cooperative. Normal mood and affect   Shwanda Soltis T. Fuller Plan, MD 07/04/2022, 11:29 AM

## 2022-07-10 ENCOUNTER — Encounter: Payer: Self-pay | Admitting: Family Medicine

## 2022-07-10 ENCOUNTER — Ambulatory Visit (INDEPENDENT_AMBULATORY_CARE_PROVIDER_SITE_OTHER): Payer: Medicare Other | Admitting: Family Medicine

## 2022-07-10 VITALS — BP 140/78 | HR 64 | Temp 97.6°F | Ht 75.0 in | Wt 208.0 lb

## 2022-07-10 DIAGNOSIS — H6982 Other specified disorders of Eustachian tube, left ear: Secondary | ICD-10-CM | POA: Diagnosis not present

## 2022-07-10 DIAGNOSIS — H60502 Unspecified acute noninfective otitis externa, left ear: Secondary | ICD-10-CM | POA: Diagnosis not present

## 2022-07-10 MED ORDER — CIPROFLOXACIN-DEXAMETHASONE 0.3-0.1 % OT SUSP: 4.0000 [drp] | Freq: Two times a day (BID) | OTIC | 0 refills | Status: DC

## 2022-07-10 MED ORDER — AMOXICILLIN 875 MG PO TABS: 875.0000 mg | ORAL_TABLET | Freq: Two times a day (BID) | ORAL | 0 refills | Status: AC

## 2022-07-10 NOTE — Patient Instructions (Signed)
Take the antibiotic by mouth as prescribed.  Use the drops in the left ear as prescribed for the next 7 days.  Follow-up if you are getting worse or if you are not back to baseline when you complete the antibiotic.

## 2022-07-10 NOTE — Progress Notes (Signed)
Subjective:  Joseph Rocha is a 87 y.o. male who presents for left ear pain x 6 days. Tenderness as well. Intermittent ear fullness.   Also c/o nasal congestion and mild cough which is not new. He takes allergy medications for this.   Denies fever, chills, headache, dizziness, sore throat, chest pain, shortness of breath, abdominal pain, N/V/D.   Treatment to date: sudafed  and Tylenol. No other aggravating or relieving factors.  No other c/o.  States he is leaving soon for a cruise  ROS as in subjective.   Objective: Vitals:   07/10/22 1001  BP: (!) 140/78  Pulse: 64  Temp: 97.6 F (36.4 C)  SpO2: 98%    General appearance: Alert, WD/WN, no distress, mildly ill appearing                             Skin: warm, no rash                           Head: no sinus tenderness                            Eyes: conjunctiva normal, corneas clear, PERRLA                            Ears: left ear canal with erythema and edema, unable to fully visualize left TM, right canal and TM normal appearing                          Nose: septum midline, turbinates swollen, with erythema and clear discharge             Mouth/throat: MMM, tongue normal, mild pharyngeal erythema                           Neck: supple, no adenopathy, no thyromegaly, nontender                          Heart: RRR                         Lungs: CTA bilaterally, no wheezes, rales, or rhonchi      Assessment: Acute otitis externa of left ear, unspecified type - Plan: amoxicillin (AMOXIL) 875 MG tablet, ciprofloxacin-dexamethasone (CIPRODEX) OTIC suspension  Eustachian tube dysfunction, left - Plan: amoxicillin (AMOXIL) 875 MG tablet   Plan: Amoxicillin and Ciprodex otic prescribed.  Suggested symptomatic OTC remedies. Nasal saline spray for congestion.  Tylenol OTC as needed for pain.  Call/return if worsening or not back to baseline when he completes the antibiotics.

## 2022-07-26 DIAGNOSIS — H6123 Impacted cerumen, bilateral: Secondary | ICD-10-CM | POA: Diagnosis not present

## 2022-07-31 ENCOUNTER — Telehealth: Payer: Self-pay

## 2022-07-31 ENCOUNTER — Encounter (HOSPITAL_COMMUNITY): Payer: Self-pay | Admitting: *Deleted

## 2022-07-31 ENCOUNTER — Emergency Department (HOSPITAL_COMMUNITY)
Admission: EM | Admit: 2022-07-31 | Discharge: 2022-08-01 | Disposition: A | Discharge status: Home / Self Care | Payer: Medicare Other | Attending: Emergency Medicine | Admitting: Emergency Medicine

## 2022-07-31 ENCOUNTER — Other Ambulatory Visit: Payer: Self-pay

## 2022-07-31 ENCOUNTER — Emergency Department (HOSPITAL_COMMUNITY): Payer: Medicare Other

## 2022-07-31 DIAGNOSIS — R231 Pallor: Secondary | ICD-10-CM | POA: Diagnosis not present

## 2022-07-31 DIAGNOSIS — Z7984 Long term (current) use of oral hypoglycemic drugs: Secondary | ICD-10-CM | POA: Insufficient documentation

## 2022-07-31 DIAGNOSIS — E876 Hypokalemia: Secondary | ICD-10-CM | POA: Diagnosis not present

## 2022-07-31 DIAGNOSIS — R110 Nausea: Secondary | ICD-10-CM | POA: Diagnosis not present

## 2022-07-31 DIAGNOSIS — R55 Syncope and collapse: Secondary | ICD-10-CM | POA: Insufficient documentation

## 2022-07-31 DIAGNOSIS — A084 Viral intestinal infection, unspecified: Secondary | ICD-10-CM | POA: Diagnosis not present

## 2022-07-31 DIAGNOSIS — U071 COVID-19: Secondary | ICD-10-CM | POA: Insufficient documentation

## 2022-07-31 DIAGNOSIS — E860 Dehydration: Secondary | ICD-10-CM | POA: Insufficient documentation

## 2022-07-31 DIAGNOSIS — E119 Type 2 diabetes mellitus without complications: Secondary | ICD-10-CM

## 2022-07-31 DIAGNOSIS — R1084 Generalized abdominal pain: Secondary | ICD-10-CM | POA: Insufficient documentation

## 2022-07-31 DIAGNOSIS — K5730 Diverticulosis of large intestine without perforation or abscess without bleeding: Secondary | ICD-10-CM | POA: Diagnosis not present

## 2022-07-31 DIAGNOSIS — R112 Nausea with vomiting, unspecified: Secondary | ICD-10-CM | POA: Diagnosis not present

## 2022-07-31 DIAGNOSIS — K449 Diaphragmatic hernia without obstruction or gangrene: Secondary | ICD-10-CM | POA: Diagnosis not present

## 2022-07-31 DIAGNOSIS — M546 Pain in thoracic spine: Secondary | ICD-10-CM | POA: Diagnosis not present

## 2022-07-31 DIAGNOSIS — I10 Essential (primary) hypertension: Secondary | ICD-10-CM | POA: Insufficient documentation

## 2022-07-31 DIAGNOSIS — I959 Hypotension, unspecified: Secondary | ICD-10-CM | POA: Diagnosis not present

## 2022-07-31 DIAGNOSIS — J439 Emphysema, unspecified: Secondary | ICD-10-CM | POA: Diagnosis not present

## 2022-07-31 DIAGNOSIS — Z7982 Long term (current) use of aspirin: Secondary | ICD-10-CM | POA: Insufficient documentation

## 2022-07-31 DIAGNOSIS — R197 Diarrhea, unspecified: Secondary | ICD-10-CM | POA: Diagnosis present

## 2022-07-31 DIAGNOSIS — N400 Enlarged prostate without lower urinary tract symptoms: Secondary | ICD-10-CM | POA: Diagnosis not present

## 2022-07-31 DIAGNOSIS — M549 Dorsalgia, unspecified: Secondary | ICD-10-CM | POA: Diagnosis not present

## 2022-07-31 LAB — COMPREHENSIVE METABOLIC PANEL
ALT: 27 U/L (ref 0–44)
AST: 29 U/L (ref 15–41)
Albumin: 3.1 g/dL — ABNORMAL LOW (ref 3.5–5.0)
Alkaline Phosphatase: 52 U/L (ref 38–126)
Anion gap: 7 (ref 5–15)
BUN: 20 mg/dL (ref 8–23)
CO2: 25 mmol/L (ref 22–32)
Calcium: 8.5 mg/dL — ABNORMAL LOW (ref 8.9–10.3)
Chloride: 103 mmol/L (ref 98–111)
Creatinine, Ser: 1.27 mg/dL — ABNORMAL HIGH (ref 0.61–1.24)
GFR, Estimated: 55 mL/min — ABNORMAL LOW (ref >60)
Glucose, Bld: 127 mg/dL — ABNORMAL HIGH (ref 70–99)
Potassium: 3 mmol/L — ABNORMAL LOW (ref 3.5–5.1)
Sodium: 135 mmol/L (ref 135–145)
Total Bilirubin: 0.4 mg/dL (ref 0.3–1.2)
Total Protein: 6 g/dL — ABNORMAL LOW (ref 6.5–8.1)

## 2022-07-31 LAB — URINALYSIS, ROUTINE W REFLEX MICROSCOPIC
Bilirubin Urine: NEGATIVE
Glucose, UA: NEGATIVE mg/dL
Hgb urine dipstick: NEGATIVE
Ketones, ur: NEGATIVE mg/dL
Leukocytes,Ua: NEGATIVE
Nitrite: NEGATIVE
Protein, ur: NEGATIVE mg/dL
Specific Gravity, Urine: 1.01 (ref 1.005–1.030)
pH: 6.5 (ref 5.0–8.0)

## 2022-07-31 LAB — RESP PANEL BY RT-PCR (FLU A&B, COVID) ARPGX2
Influenza A by PCR: NEGATIVE
Influenza B by PCR: NEGATIVE
SARS Coronavirus 2 by RT PCR: POSITIVE — AB

## 2022-07-31 LAB — CBC
HCT: 32.5 % — ABNORMAL LOW (ref 39.0–52.0)
Hemoglobin: 11.2 g/dL — ABNORMAL LOW (ref 13.0–17.0)
MCH: 31.4 pg (ref 26.0–34.0)
MCHC: 34.5 g/dL (ref 30.0–36.0)
MCV: 91 fL (ref 80.0–100.0)
Platelets: 167 10*3/uL (ref 150–400)
RBC: 3.57 MIL/uL — ABNORMAL LOW (ref 4.22–5.81)
RDW: 13.5 % (ref 11.5–15.5)
WBC: 8.7 10*3/uL (ref 4.0–10.5)
nRBC: 0 % (ref 0.0–0.2)

## 2022-07-31 LAB — LACTIC ACID, PLASMA
Lactic Acid, Venous: 2.2 mmol/L (ref 0.5–1.9)
Lactic Acid, Venous: 1.4 mmol/L (ref 0.5–1.9)

## 2022-07-31 LAB — MAGNESIUM: Magnesium: 1.2 mg/dL — ABNORMAL LOW (ref 1.7–2.4)

## 2022-07-31 LAB — TROPONIN I (HIGH SENSITIVITY)
Troponin I (High Sensitivity): 4 ng/L (ref <18)
Troponin I (High Sensitivity): 4 ng/L (ref <18)

## 2022-07-31 LAB — LIPASE, BLOOD: Lipase: 25 U/L (ref 11–51)

## 2022-07-31 MED ORDER — LACTATED RINGERS IV BOLUS
1000.0000 mL | Freq: Once | INTRAVENOUS | Status: AC
  Administered 2022-07-31: 1000 mL via INTRAVENOUS

## 2022-07-31 MED ORDER — LEVOTHYROXINE SODIUM 50 MCG PO TABS: 50.0000 ug | ORAL_TABLET | Freq: Every day | ORAL | 1 refills | Status: DC

## 2022-07-31 MED ORDER — REPAGLINIDE 2 MG PO TABS
2.0000 mg | ORAL_TABLET | Freq: Two times a day (BID) | ORAL | 3 refills | Status: DC
Start: 2022-07-31 — End: 2022-09-11

## 2022-07-31 MED ORDER — NIRMATRELVIR/RITONAVIR (PAXLOVID)TABLET: 2.0000 | ORAL_TABLET | Freq: Two times a day (BID) | ORAL | 0 refills | Status: AC

## 2022-07-31 MED ORDER — POTASSIUM CHLORIDE CRYS ER 20 MEQ PO TBCR
40.0000 meq | EXTENDED_RELEASE_TABLET | Freq: Once | ORAL | Status: AC
  Administered 2022-08-01: 40 meq via ORAL
  Filled 2022-07-31: qty 2

## 2022-07-31 MED ORDER — POTASSIUM CHLORIDE 10 MEQ/100ML IV SOLN
10.0000 meq | INTRAVENOUS | Status: DC
  Administered 2022-07-31 (×2): 10 meq via INTRAVENOUS
  Filled 2022-07-31 (×2): qty 100

## 2022-07-31 MED ORDER — MAGNESIUM OXIDE -MG SUPPLEMENT 400 (240 MG) MG PO TABS
400.0000 mg | ORAL_TABLET | Freq: Once | ORAL | Status: AC
  Administered 2022-07-31: 400 mg via ORAL
  Filled 2022-07-31: qty 1

## 2022-07-31 MED ORDER — MAGNESIUM SULFATE 2 GM/50ML IV SOLN
2.0000 g | Freq: Once | INTRAVENOUS | Status: AC
  Administered 2022-07-31: 2 g via INTRAVENOUS
  Filled 2022-07-31: qty 50

## 2022-07-31 MED ORDER — IOHEXOL 350 MG/ML SOLN
80.0000 mL | Freq: Once | INTRAVENOUS | Status: AC | PRN
  Administered 2022-07-31: 80 mL via INTRAVENOUS

## 2022-07-31 MED ORDER — ONDANSETRON HCL 4 MG/2ML IJ SOLN
4.0000 mg | Freq: Once | INTRAMUSCULAR | Status: AC
  Administered 2022-07-31: 4 mg via INTRAVENOUS
  Filled 2022-07-31: qty 2

## 2022-07-31 MED ORDER — ONDANSETRON 4 MG PO TBDP: 4.0000 mg | ORAL_TABLET | Freq: Three times a day (TID) | ORAL | 0 refills | Status: DC | PRN

## 2022-07-31 NOTE — Telephone Encounter (Signed)
Spoke with patient about this medication (repaglinide) that was previously prescribed by Dr.Sean Loanne Drilling who is no longer practicing, patient was receiving it through [facility]. He is also requesting a refill for Synthroid that was prescribed by Dr.Cristina Gherghe but is not able to contact their office. Patient is requesting these meds through [facility] but the pharmacy is unable to fill his request unless there is a new script for each one.

## 2022-07-31 NOTE — Discharge Instructions (Addendum)
Paxlovid has been sent to your pharmacy on file. Recommend continued oral rehydration with electrolyte containing fluids. Return for any worsening symptoms.

## 2022-07-31 NOTE — Telephone Encounter (Signed)
Ok these are sent to [facility]

## 2022-07-31 NOTE — ED Triage Notes (Addendum)
Pt arrives by ems from home for n/v and diarrhea which began an hour ago.  Pt had a syncopal episode pta which was witnessed by his wife and lasted 3 minutes.  No CP ro sob with this.  Pt had '4mg'$  IV zofran and 565m NS from ems pta.  18g in LAC

## 2022-07-31 NOTE — Telephone Encounter (Signed)
MEDICATION: repaglinide (PRANDIN) 2 MG tablet  PHARMACY: [facility] [place], [place] to Registered [facility] Sites  Comments: Patient states Endo office hasnt answered the phone. I attempted to contact the office but it was not successful. Patient is completely out.   **Let patient know to contact pharmacy at the end of the day to make sure medication is ready. **  ** Please notify patient to allow 48-72 hours to process**  **Encourage patient to contact the pharmacy for refills or they can request refills through [facility]**

## 2022-07-31 NOTE — Telephone Encounter (Signed)
Pt called and left a voicemail requesting a refill of his diabetic medication as he was completely out. Pt states he called yesterday as well. Pt contacted his PCP and requested refills and rx was sent to preferred pharmacy.  Called to confirm pt was able to get medication and when trying to contact pt, his spouse answered the phone to advise he was currently being transported to the hospital.

## 2022-07-31 NOTE — ED Provider Triage Note (Signed)
Emergency Medicine Provider Triage Evaluation Note  Joseph Rocha , a 87 y.o. male  was evaluated in triage.  Pt complains of nausea, vomiting, diarrhea, back pain.  Symptoms began earlier today.  Brought in by EMS.  Patient was noted to be hypotensive and given 500 L of normal saline in route by EMS.  Patient did have a syncopal episode that was witnessed by wife prior to arrival that lasted for approximately 3 minutes.  Patient denies chest pain or shortness of breath.  Review of Systems  Positive: N/v/d, back pain, syncope Negative: Chest pain, sob  Physical Exam  BP (!) 92/56 (BP Location: Right Arm)   Pulse (!) 56   Temp 97.6 F (36.4 C) (Oral)   Resp 16   SpO2 98%  Gen:   Awake, no distress   Resp:  Normal effort  MSK:   Moves extremities without difficulty  Other:  Heart regular rate and rhythm.  No focal abdominal tenderness.  Lungs are clear to auscultation bilaterally.  Medical Decision Making  Medically screening exam initiated at 5:10 PM.  Appropriate orders placed.  Joseph Rocha was informed that the remainder of the evaluation will be completed by another provider, this initial triage assessment does not replace that evaluation, and the importance of remaining in the ED until their evaluation is complete.  Patient presents for nausea, vomiting, diarrhea, hypotension and syncopal episode.  Denies chest pain or shortness of breath.  Patient had to be hypotensive on arrival to the ER and bradycardia.  Patient has no focal abdominal tenderness.  Will obtain basic lab work along with EKG and troponin given patient's syncopal episode which could be secondary to hypotension.   Doristine Devoid, PA-C 07/31/22 1714

## 2022-07-31 NOTE — Telephone Encounter (Signed)
Noted! Thank you

## 2022-07-31 NOTE — ED Provider Notes (Signed)
[facility] EMERGENCY DEPARTMENT Provider Note   CSN: [id number] Arrival date & time: 07/31/22  1634     History  Chief Complaint  Patient presents with   Nausea    Joseph Rocha is a 87 y.o. male.  HPI   87 year old male with medical history significant for DM 2, HLD, HTN who presents to the emergency department with a chief complaint of nausea, vomiting, diarrhea for the past 24 hours.  The patient was notably hypotensive with EMS and given 500 cc of normal saline in route.  He did have a syncopal episode that was witnessed by his wife prior to arrival and lasted for approximately 3 minutes and occurred after standing.  Denies any chest pain or shortness of breath.  Denies any fevers or chills, he denies any cough.  He has had multiple episodes of diarrhea today and feels dehydrated.  He endorses NBNB emesis.  He denies any sick contacts.  He was administered 4 mg of IV Zofran and 500 cc of normal saline with EMS prior to arrival. He had complained of some generalized abdominal discomfort in the setting of his illness and mild back pain midline. No neurologic deficits.   Home Medications Prior to Admission medications   Medication Sig Start Date End Date Taking? Authorizing Provider  nirmatrelvir/ritonavir EUA (PAXLOVID) 20 x 150 MG & 10 x '100MG'$  TABS Take 2 tablets by mouth 2 (two) times daily for 5 days. Patient GFR is 55. Take nirmatrelvir (150 mg) two tablets twice daily for 5 days and ritonavir (100 mg) one tablet twice daily for 5 days. 07/31/22 08/05/22 Yes Regan Lemming, MD  ondansetron (ZOFRAN-ODT) 4 MG disintegrating tablet Take 1 tablet (4 mg total) by mouth every 8 (eight) hours as needed for nausea or vomiting. 07/31/22  Yes Regan Lemming, MD  aspirin EC 81 MG tablet Take 1 tablet (81 mg total) by mouth daily. Swallow whole. 07/13/20   Biagio Borg, MD  calcium carbonate (OS-CAL) 600 MG TABS Take 600 mg by mouth daily.    [provider]   cetirizine-pseudoephedrine (ZYRTEC-D) 5-120 MG tablet Take 1 tablet by mouth daily. 05/09/17   Renato Shin, MD  ciprofloxacin-dexamethasone [facility]) OTIC suspension Place 4 drops into the left ear 2 (two) times daily. 07/10/22   Henson, Vickie L, NP-C  diphenoxylate-atropine (LOMOTIL) 2.5-0.025 MG tablet Take 1 tablet by mouth 4 (four) times daily as needed for diarrhea or loose stools. 12/21/21   Biagio Borg, MD  finasteride (PROSCAR) 5 MG tablet TAKE 1 TABLET DAILY 04/04/21   Biagio Borg, MD  fludrocortisone (FLORINEF) 0.1 MG tablet TAKE 1 TABLET DAILY 05/26/22   Biagio Borg, MD  fluticasone [facility]) 50 MCG/ACT nasal spray Place 2 sprays into both nostrils daily. 10/15/18   Hoyt Koch, MD  folic acid (FOLVITE) 1 MG tablet TAKE 1 TABLET DAILY 04/03/22   Biagio Borg, MD  IRON PO Take by mouth.    [provider]  KLOR-CON M20 20 MEQ tablet Take 20 mEq by mouth daily.  03/15/13   [provider]  levothyroxine (SYNTHROID) 50 MCG tablet Take 1 tablet (50 mcg total) by mouth daily. 07/31/22   Biagio Borg, MD  loperamide (IMODIUM A-D) 2 MG tablet Take 1 tablet (2 mg total) by mouth 2 (two) times daily as needed for diarrhea or loose stools. 04/16/22   Levin Erp, PA  metFORMIN (GLUCOPHAGE) 500 MG tablet TAKE 1 TABLET TWICE DAILY  WITH MEALS  11/20/21   Renato Shin, MD  methocarbamol (ROBAXIN) 500 MG tablet 1 tab by mouth at bedtime as needed 12/30/20   Biagio Borg, MD  Multiple Vitamins-Minerals (CENTRUM SILVER PO) Take 1 tablet by mouth daily.    [provider]  Omega-3 Fatty Acids (FISH OIL) 1000 MG CAPS Take 1 capsule by mouth daily.    [provider]  omeprazole (PRILOSEC) 40 MG capsule TAKE 1 CAPSULE DAILY 04/03/22   Biagio Borg, MD  repaglinide (PRANDIN) 2 MG tablet Take 1 tablet (2 mg total) by mouth 2 (two) times daily before a meal. 07/31/22   Biagio Borg, MD  rosuvastatin (CRESTOR) 5 MG tablet TAKE 1 TABLET DAILY 10/31/21    Biagio Borg, MD  Tamsulosin HCl (FLOMAX) 0.4 MG CAPS Take 0.4 mg by mouth daily.    [provider]  Testosterone 30 MG/ACT SOLN Place 2 Pump onto the skin daily.    [provider]  traMADol (ULTRAM) 50 MG tablet Take 1 tablet (50 mg total) by mouth every 6 (six) hours as needed. 05/07/18   Renato Shin, MD  triamcinolone (NASACORT) 55 MCG/ACT AERO nasal inhaler Place 2 sprays into the nose daily. 07/13/20   Biagio Borg, MD      Allergies    Lipitor [atorvastatin]    Review of Systems   Review of Systems  All other systems reviewed and are negative.   Physical Exam Updated Vital Signs BP (!) 136/100   Pulse 65   Temp (!) 97.5 F (36.4 C) (Oral)   Resp 17   SpO2 98%  Physical Exam Vitals and nursing note reviewed.  Constitutional:      General: He is not in acute distress.    Appearance: He is well-developed.  HENT:     Head: Normocephalic and atraumatic.     Mouth/Throat:     Mouth: Mucous membranes are dry.  Eyes:     Conjunctiva/sclera: Conjunctivae normal.  Cardiovascular:     Rate and Rhythm: Normal rate and regular rhythm.  Pulmonary:     Effort: Pulmonary effort is normal. No respiratory distress.     Breath sounds: Normal breath sounds.  Abdominal:     Palpations: Abdomen is soft.     Tenderness: There is no abdominal tenderness.  Musculoskeletal:        General: No swelling.     Cervical back: Neck supple.  Skin:    General: Skin is warm and dry.     Capillary Refill: Capillary refill takes less than 2 seconds.  Neurological:     General: No focal deficit present.     Mental Status: He is alert and oriented to person, place, and time.     Cranial Nerves: No cranial nerve deficit.     Sensory: No sensory deficit.     Motor: No weakness.  Psychiatric:        Mood and Affect: Mood normal.     ED Results / Procedures / Treatments   Labs (all labs ordered are listed, but only abnormal results are displayed) Labs Reviewed  RESP  PANEL BY RT-PCR (FLU A&B, COVID) ARPGX2 - Abnormal; Notable for the following components:      Result Value   SARS Coronavirus 2 by RT PCR POSITIVE (*)    All other components within normal limits  COMPREHENSIVE METABOLIC PANEL - Abnormal; Notable for the following components:   Potassium 3.0 (*)    Glucose, Bld 127 (*)    Creatinine,  Ser 1.27 (*)    Calcium 8.5 (*)    Total Protein 6.0 (*)    Albumin 3.1 (*)    GFR, Estimated 55 (*)    All other components within normal limits  CBC - Abnormal; Notable for the following components:   RBC 3.57 (*)    Hemoglobin 11.2 (*)    HCT 32.5 (*)    All other components within normal limits  LACTIC ACID, PLASMA - Abnormal; Notable for the following components:   Lactic Acid, Venous 2.2 (*)    All other components within normal limits  MAGNESIUM - Abnormal; Notable for the following components:   Magnesium 1.2 (*)    All other components within normal limits  LIPASE, BLOOD  URINALYSIS, ROUTINE W REFLEX MICROSCOPIC  LACTIC ACID, PLASMA  TROPONIN I (HIGH SENSITIVITY)  TROPONIN I (HIGH SENSITIVITY)    EKG EKG Interpretation  Date/Time:  Tuesday July 31 2022 16:47:28 EDT Ventricular Rate:  57 PR Interval:  188 QRS Duration: 100 QT Interval:  482 QTC Calculation: 469 R Axis:   -7 Text Interpretation: Sinus bradycardia Nonspecific T wave abnormality Prolonged QT Abnormal ECG When compared with ECG of 13-Jan-2019 01:07, PREVIOUS ECG IS PRESENT Confirmed by Regan Lemming ([id number]) on 07/31/2022 6:43:19 PM  Radiology CT Angio Chest/Abd/Pel for Dissection W and/or Wo Contrast  Result Date: 07/31/2022 CLINICAL DATA:  Acute aortic syndrome. Chest pain nausea and back pain since this morning. EXAM: CT ANGIOGRAPHY CHEST, ABDOMEN AND PELVIS TECHNIQUE: Non-contrast CT of the chest was initially obtained. Multidetector CT imaging through the chest, abdomen and pelvis was performed using the standard protocol during bolus administration of  intravenous contrast. Multiplanar reconstructed images and MIPs were obtained and reviewed to evaluate the vascular anatomy. RADIATION DOSE REDUCTION: This exam was performed according to the departmental dose-optimization program which includes automated exposure control, adjustment of the mA and/or kV according to patient size and/or use of iterative reconstruction technique. CONTRAST:  91m OMNIPAQUE IOHEXOL 350 MG/ML SOLN COMPARISON:  CT chest from 2017 and CT abdomen from 2019 FINDINGS: CTA CHEST FINDINGS Cardiovascular: The heart is normal in size. No pericardial effusion. The ascending thoracic aorta is normal in caliber. No dissection. Mild tortuosity of the thoracic aorta but no significant atherosclerotic calcifications. The branch vessels are patent. Scattered three-vessel coronary artery calcifications are noted. The pulmonary arteries are grossly normal. Mediastinum/Nodes: No mediastinal or hilar mass or lymphadenopathy. The esophagus is grossly normal. Small hiatal hernia noted. Lungs/Pleura: Stable mild emphysematous changes. No infiltrates, edema or effusions. No worrisome pulmonary lesions or pulmonary nodules. No pleural effusions or pleural nodules. Musculoskeletal: No chest wall mass, supraclavicular or axillary adenopathy. The bony thorax is intact. Review of the MIP images confirms the above findings. CTA ABDOMEN AND PELVIS FINDINGS VASCULAR Aorta: Normal caliber. Remarkably little atherosclerotic calcifications for age. No dissection. Celiac: Normal SMA: Normal Renals: Minimal scattered calcifications but no significant stenosis. IMA: Patent Inflow: Minimal iliac artery calcifications. No aneurysm or dissection. Veins: Unremarkable. Review of the MIP images confirms the above findings. NON-VASCULAR Hepatobiliary: Stable left hepatic lobe cyst. No worrisome hepatic lesions or intrahepatic biliary dilatation. Gallbladder is unremarkable. No common bile duct dilatation. Pancreas: No mass,  inflammation or ductal dilatation. Spleen: Normal size.  No focal lesions. Adrenals/Urinary Tract: Adrenal glands are normal. No renal lesions or hydronephrosis.  The bladder is unremarkable. Stomach/Bowel: The stomach, duodenum, small bowel and colon are grossly normal. The terminal ileum and appendix are normal. There is severe descending and upper sigmoid colon diverticulosis but  no findings for acute diverticulitis. Lymphatic: No mesenteric or retroperitoneal adenopathy. Reproductive: Enlarged prostate gland impressing on the base of the bladder. The seminal vesicles are unremarkable. Other: No pelvic mass or adenopathy. No free pelvic fluid collections. No inguinal mass or adenopathy. No abdominal wall hernia or subcutaneous lesions. Both testicles are in the inguinal canals. Musculoskeletal: A few small scattered sclerotic pelvic bone lesions unchanged since 2019. Review of the MIP images confirms the above findings. IMPRESSION: 1. No acute findings in the chest, abdomen or pelvis. Specifically, the thoracic and abdominal aorta are normal in caliber and no dissection. 2. Stable mild emphysematous changes. No acute pulmonary findings. 3. Severe descending and upper sigmoid colon diverticulosis without findings for acute diverticulitis. 4. Enlarged prostate gland. 5. Both testicles are in the inguinal canals. Emphysema (ICD10-J43.9). Electronically Signed   By: Marijo Sanes M.D.   On: 07/31/2022 21:12    Procedures Procedures    Medications Ordered in ED Medications  potassium chloride SA (KLOR-CON M) CR tablet 40 mEq (has no administration in time range)  magnesium sulfate IVPB 2 g 50 mL (0 g Intravenous Stopped 07/31/22 2157)  lactated ringers bolus 1,000 mL (0 mLs Intravenous Stopped 07/31/22 2304)  iohexol (OMNIPAQUE) 350 MG/ML injection 80 mL (80 mLs Intravenous Contrast Given 07/31/22 2056)  ondansetron (ZOFRAN) injection 4 mg (4 mg Intravenous Given 07/31/22 2304)  magnesium oxide (MAG-OX)  tablet 400 mg (400 mg Oral Given 07/31/22 2323)    ED Course/ Medical Decision Making/ A&P Clinical Course as of 08/01/22 0007  Tue Jul 31, 2022  1841 Potassium(!): 3.0 [JL]  1842 Magnesium(!): 1.2 [JL]  2259 SARS Coronavirus 2 by RT PCR(!): POSITIVE [JL]    Clinical Course User Index [JL] Regan Lemming, MD                           Medical Decision Making Amount and/or Complexity of Data Reviewed Labs: ordered. Decision-making details documented in ED Course. Radiology: ordered.  Risk OTC drugs. Prescription drug management.  87 year old male with medical history significant for DM 2, HLD, HTN who presents to the emergency department with a chief complaint of nausea, vomiting, diarrhea for the past 24 hours.  The patient was notably hypotensive with EMS and given 500 cc of normal saline in route.  He did have a syncopal episode that was witnessed by his wife prior to arrival and lasted for approximately 3 minutes and occurred after standing.  Denies any chest pain or shortness of breath.  Denies any fevers or chills, he denies any cough.  He has had multiple episodes of diarrhea today and feels dehydrated.  He endorses NBNB emesis.  He denies any sick contacts.  He was administered 4 mg of IV Zofran and 500 cc of normal saline with EMS prior to arrival. He had complained of some generalized abdominal discomfort in the setting of his illness and mild back pain midline. No neurologic deficits.   On arrival, the patient was afebrile, bradycardic P56, RR 1516, initially borderline hypotensive BP 92/56, subsequent improved to 134/76 after an IV fluid bolus.  Concern for hypovolemia in the setting of a diarrheal illness.  No chest pain or shortness of breath, no palpitations.  No seizure activity.  The patient is neurologically intact.  Low concern for ACS, PE, cardiogenic arrhythmia.  Initial EKG revealed sinus bradycardia, ventricular rate 57, nonspecific T wave changes, borderline prolonged  QTc at 469.  Laboratory evaluation significant for troponins negative  x2, initial lactic acid mildly elevated 2.2, repeat normalized to 1.4 following IV fluid resuscitation.  COVID-19 PCR testing did result positive for COVID-19.  The patient has no desaturations noted on pulse oximetry, and is overall well-appearing.  His CBC was without a leukocytosis, lipase was normal.  The the patient had hypomagnesemia to 1.2 which was replenished orally and IV.  His potassium was mildly hypokalemic to 3.0 which was also replenished orally and IV.  His creatinine was elevated to 1.27 which appears to be at his baseline.  In the setting of syncope, abdominal pain, back pain, CTA was performed to evaluate for aneurysm or dissection, which resulted negative.  UA also resulted negative for UTI.  IMPRESSION:  1. No acute findings in the chest, abdomen or pelvis. Specifically,  the thoracic and abdominal aorta are normal in caliber and no  dissection.  2. Stable mild emphysematous changes. No acute pulmonary findings.  3. Severe descending and upper sigmoid colon diverticulosis without  findings for acute diverticulitis.  4. Enlarged prostate gland.  5. Both testicles are in the inguinal canals.    On repeat assessment, the patient was feeling symptomatically improved following IV fluids and IV potassium and magnesium replenishment.  Discussed admission for observation in the setting of a syncopal episode however, the patient would prefer to be discharged home to continue to recover at home.  I provided strict return precautions in the event of worsening symptoms.  Repeat EKG revealed normal Qtc, mildly prolonged PR interval but otherwise no abnormal intervals or other syncope associated finding. Had discussed with patient and family bedside admission for observation in the setting of likely dehydration and syncope, however, the patient would like to be discharged and recover at home. We will start the patient on  Paxlovid and prescribe Zofran for nausea. Overall well appearing, stable for discharge, return precautions provided.   Final Clinical Impression(s) / ED Diagnoses Final diagnoses:  Viral gastroenteritis  COVID-19  Hypokalemia  Hypomagnesemia  Syncope, unspecified syncope type  Dehydration    Rx / DC Orders ED Discharge Orders          Ordered    nirmatrelvir/ritonavir EUA (PAXLOVID) 20 x 150 MG & 10 x '100MG'$  TABS  2 times daily        07/31/22 2318    ondansetron (ZOFRAN-ODT) 4 MG disintegrating tablet  Every 8 hours PRN        07/31/22 2319              Regan Lemming, MD 08/01/22 0007

## 2022-08-01 DIAGNOSIS — U071 COVID-19: Secondary | ICD-10-CM | POA: Diagnosis not present

## 2022-08-09 DIAGNOSIS — Z03818 Encounter for observation for suspected exposure to other biological agents ruled out: Secondary | ICD-10-CM | POA: Diagnosis not present

## 2022-08-09 DIAGNOSIS — Z20828 Contact with and (suspected) exposure to other viral communicable diseases: Secondary | ICD-10-CM | POA: Diagnosis not present

## 2022-08-21 ENCOUNTER — Encounter: Payer: Self-pay | Admitting: Internal Medicine

## 2022-08-21 ENCOUNTER — Ambulatory Visit (INDEPENDENT_AMBULATORY_CARE_PROVIDER_SITE_OTHER): Payer: Medicare Other | Admitting: Internal Medicine

## 2022-08-21 ENCOUNTER — Ambulatory Visit (INDEPENDENT_AMBULATORY_CARE_PROVIDER_SITE_OTHER): Payer: Medicare Other

## 2022-08-21 VITALS — BP 122/74 | HR 66 | Temp 97.8°F | Ht 75.0 in | Wt 207.0 lb

## 2022-08-21 DIAGNOSIS — E673 Hypervitaminosis D: Secondary | ICD-10-CM

## 2022-08-21 DIAGNOSIS — R42 Dizziness and giddiness: Secondary | ICD-10-CM | POA: Diagnosis not present

## 2022-08-21 DIAGNOSIS — E538 Deficiency of other specified B group vitamins: Secondary | ICD-10-CM

## 2022-08-21 DIAGNOSIS — M546 Pain in thoracic spine: Secondary | ICD-10-CM

## 2022-08-21 DIAGNOSIS — R7989 Other specified abnormal findings of blood chemistry: Secondary | ICD-10-CM

## 2022-08-21 DIAGNOSIS — M542 Cervicalgia: Secondary | ICD-10-CM | POA: Insufficient documentation

## 2022-08-21 DIAGNOSIS — E1165 Type 2 diabetes mellitus with hyperglycemia: Secondary | ICD-10-CM

## 2022-08-21 LAB — LIPID PANEL
Cholesterol: 162 mg/dL (ref 0–200)
HDL: 45.3 mg/dL (ref >39.00)
LDL Cholesterol: 84 mg/dL (ref 0–99)
NonHDL: 116.6
Total CHOL/HDL Ratio: 4
Triglycerides: 163 mg/dL — ABNORMAL HIGH (ref 0.0–149.0)
VLDL: 32.6 mg/dL (ref 0.0–40.0)

## 2022-08-21 LAB — CBC WITH DIFFERENTIAL/PLATELET
Basophils Absolute: 0.1 10*3/uL (ref 0.0–0.1)
Basophils Relative: 0.9 % (ref 0.0–3.0)
Eosinophils Absolute: 0.3 10*3/uL (ref 0.0–0.7)
Eosinophils Relative: 5 % (ref 0.0–5.0)
HCT: 36.7 % — ABNORMAL LOW (ref 39.0–52.0)
Hemoglobin: 12.4 g/dL — ABNORMAL LOW (ref 13.0–17.0)
Lymphocytes Relative: 36.7 % (ref 12.0–46.0)
Lymphs Abs: 2.4 10*3/uL (ref 0.7–4.0)
MCHC: 33.8 g/dL (ref 30.0–36.0)
MCV: 92.5 fl (ref 78.0–100.0)
Monocytes Absolute: 0.7 10*3/uL (ref 0.1–1.0)
Monocytes Relative: 11.3 % (ref 3.0–12.0)
Neutro Abs: 3 10*3/uL (ref 1.4–7.7)
Neutrophils Relative %: 46.1 % (ref 43.0–77.0)
Platelets: 197 10*3/uL (ref 150.0–400.0)
RBC: 3.96 Mil/uL — ABNORMAL LOW (ref 4.22–5.81)
RDW: 14.1 % (ref 11.5–15.5)
WBC: 6.5 10*3/uL (ref 4.0–10.5)

## 2022-08-21 LAB — URINALYSIS, ROUTINE W REFLEX MICROSCOPIC
Bilirubin Urine: NEGATIVE
Hgb urine dipstick: NEGATIVE
Leukocytes,Ua: NEGATIVE
Nitrite: NEGATIVE
RBC / HPF: NONE SEEN (ref 0–?)
Specific Gravity, Urine: 1.015 (ref 1.000–1.030)
Total Protein, Urine: NEGATIVE
Urine Glucose: NEGATIVE
Urobilinogen, UA: 1 (ref 0.0–1.0)
pH: 6.5 (ref 5.0–8.0)

## 2022-08-21 LAB — HEPATIC FUNCTION PANEL
ALT: 16 U/L (ref 0–53)
AST: 18 U/L (ref 0–37)
Albumin: 4 g/dL (ref 3.5–5.2)
Alkaline Phosphatase: 50 U/L (ref 39–117)
Bilirubin, Direct: 0.1 mg/dL (ref 0.0–0.3)
Total Bilirubin: 0.5 mg/dL (ref 0.2–1.2)
Total Protein: 6.9 g/dL (ref 6.0–8.3)

## 2022-08-21 LAB — VITAMIN D 25 HYDROXY (VIT D DEFICIENCY, FRACTURES): VITD: 60.15 ng/mL (ref 30.00–100.00)

## 2022-08-21 LAB — BASIC METABOLIC PANEL
BUN: 19 mg/dL (ref 6–23)
CO2: 31 mEq/L (ref 19–32)
Calcium: 9.3 mg/dL (ref 8.4–10.5)
Chloride: 102 mEq/L (ref 96–112)
Creatinine, Ser: 1.19 mg/dL (ref 0.40–1.50)
GFR: 55.68 mL/min — ABNORMAL LOW (ref >60.00)
Glucose, Bld: 118 mg/dL — ABNORMAL HIGH (ref 70–99)
Potassium: 3.9 mEq/L (ref 3.5–5.1)
Sodium: 138 mEq/L (ref 135–145)

## 2022-08-21 LAB — VITAMIN B12: Vitamin B-12: 342 pg/mL (ref 211–911)

## 2022-08-21 LAB — TSH: TSH: 3.13 u[IU]/mL (ref 0.35–5.50)

## 2022-08-21 LAB — HEMOGLOBIN A1C: Hgb A1c MFr Bld: 6.5 % (ref 4.6–6.5)

## 2022-08-21 LAB — MICROALBUMIN / CREATININE URINE RATIO
Creatinine,U: 174.8 mg/dL
Microalb Creat Ratio: 0.4 mg/g (ref 0.0–30.0)
Microalb, Ur: <0.7 mg/dL (ref 0.0–1.9)

## 2022-08-21 NOTE — Assessment & Plan Note (Signed)
Lab Results  Component Value Date   HGBA1C 6.5 03/20/2022   Stable, pt to continue current medical treatment metformin 500 bid

## 2022-08-21 NOTE — Patient Instructions (Addendum)
You had the flu shot today  Please have your Shingrix (shingles) shots done at your local pharmacy.  Please continue all other medications as before, and refills have been done if requested.  Please have the pharmacy call with any other refills you may need.  Please continue your efforts at being more active, low cholesterol diet, and weight control.  You are otherwise up to date with prevention measures today.  Please keep your appointments with your specialists as you may have planned  Please go to the XRAY Department in the first floor for the x-ray testing  Please go to the LAB at the blood drawing area for the tests to be done  You will be contacted by phone if any changes need to be made immediately.  Otherwise, you will receive a letter about your results with an explanation, but please check with MyChart first.  Please remember to sign up for MyChart if you have not done so, as this will be important to you in the future with finding out test results, communicating by private email, and scheduling acute appointments online when needed.  Please make an Appointment to return in 6 months, or sooner if needed

## 2022-08-21 NOTE — Assessment & Plan Note (Addendum)
C/w msk pain likely underlying c spine djd ddd - for flexeril 5 tid prn, also for xray today

## 2022-08-21 NOTE — Assessment & Plan Note (Signed)
Etiology unclear, for cxr, labs including cbc today, consider imaging if not improved

## 2022-08-21 NOTE — Progress Notes (Signed)
Patient ID: Joseph Rocha, male   DOB: 13-Sep-1935, 87 y.o.   MRN: [id number]        Chief Complaint: follow up post neck pain, mid thoracic back pain, fatigue and intermittent, DM and just wants to be checked over       HPI:  Joseph Rocha is a 87 y.o. male here with c/o 1 wk worsening post neck pain, sharp and dull, with crackling and worse moderate to horizontal movement.  No UE radicular symptoms.  Also has c/o mid thoracic pain for unclear reason, mild intermittent, sharp, worse to stand up or bend at times Does c/o ongoing fatigue, but denies signficant daytime hypersomnolence.  Pt denies chest pain, increased sob or doe, wheezing, orthopnea, PND, increased LE swelling, palpitations, or syncope.   Pt denies polydipsia, polyuria, or new focal neuro s/s, but does have mild unsteady dizziness worse in the past month as well.  Concerned he may have an underlying problem but not sure what it is.  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 He has never used  smokeless tobacco. He reports that he does not drink alcohol and does not use drugs. family history includes Cancer in his brother; Diabetes Mellitus II in his mother; Healthy in his daughter; Heart attack in his father. Allergies  Allergen Reactions   Lipitor [Atorvastatin] Other (See Comments)    Myalgias, cramps in hand   Current Outpatient Medications on File Prior to Visit  Medication Sig Dispense Refill   aspirin EC 81 MG tablet Take 1 tablet (81 mg total) by mouth daily. Swallow whole. 30 tablet 11   calcium carbonate (OS-CAL) 600 MG TABS Take 600 mg by mouth daily.     cetirizine-pseudoephedrine (ZYRTEC-D) 5-120 MG tablet Take 1 tablet by mouth daily. 90 tablet 2   ciprofloxacin-dexamethasone (CIPRODEX) OTIC suspension Place 4 drops into the left ear 2 (two) times daily. 7.5 mL 0   diphenoxylate-atropine (LOMOTIL) 2.5-0.025 MG tablet Take 1 tablet by mouth 4 (four) times daily as needed for diarrhea or loose stools. 30 tablet 1   finasteride (PROSCAR) 5 MG tablet TAKE 1 TABLET DAILY 90 tablet 1   fludrocortisone (FLORINEF) 0.1 MG tablet TAKE 1 TABLET DAILY 90 tablet 1   fluticasone (FLONASE) 50 MCG/ACT nasal spray Place 2 sprays into both nostrils daily. 16 g  6   folic acid (FOLVITE) 1 MG tablet TAKE 1 TABLET DAILY 90 tablet 3   IRON PO Take by mouth.     KLOR-CON M20 20 MEQ tablet Take 20 mEq by mouth daily.      levothyroxine (SYNTHROID) 50 MCG tablet Take 1 tablet (50 mcg total) by mouth daily. 90 tablet 1   loperamide (IMODIUM A-D) 2 MG tablet Take 1 tablet (2 mg total) by mouth 2 (two) times daily as needed for diarrhea or loose stools. 30 tablet 3   metFORMIN (GLUCOPHAGE) 500 MG tablet TAKE 1 TABLET TWICE DAILY  WITH MEALS 180 tablet 3   methocarbamol (ROBAXIN) 500 MG tablet 1 tab by mouth at bedtime as needed 90 tablet 1   Multiple Vitamins-Minerals (CENTRUM SILVER PO) Take 1 tablet by mouth daily.     Omega-3 Fatty Acids (FISH OIL) 1000 MG CAPS Take 1 capsule by mouth daily.      omeprazole (PRILOSEC) 40 MG capsule TAKE 1 CAPSULE DAILY 90 capsule 1   ondansetron (ZOFRAN-ODT) 4 MG disintegrating tablet Take 1 tablet (4 mg total) by mouth every 8 (eight) hours as needed for nausea or vomiting. 20 tablet 0   repaglinide (PRANDIN) 2 MG tablet Take 1 tablet (2 mg total) by mouth 2 (two) times daily before a meal. 180 tablet 3   rosuvastatin (CRESTOR) 5 MG tablet TAKE 1 TABLET DAILY 90 tablet 3   Tamsulosin HCl (FLOMAX) 0.4 MG CAPS Take 0.4 mg by mouth daily.     Testosterone 30 MG/ACT SOLN Place 2 Pump onto the skin daily.     traMADol (ULTRAM) 50 MG tablet Take 1 tablet (50 mg total) by mouth every 6 (six) hours as needed. 15 tablet 0   triamcinolone (NASACORT) 55 MCG/ACT AERO nasal inhaler Place 2 sprays into the nose daily. 1 each 12   No current facility-administered medications on file prior to visit.        ROS:  All others reviewed and negative.  Objective        PE:  BP 122/74 (BP Location: Left Arm, Patient Position: Sitting, Cuff Size: Large)   Pulse 66   Temp 97.8 F (36.6 C) (Oral)   Ht '6\' 3"'$  (1.905 m)   Wt 207 lb (93.9 kg)   SpO2 92%   BMI 25.87 kg/m                 Constitutional: Pt appears in NAD               HENT: Head: NCAT.                Right Ear: External ear normal.                 Left Ear: External ear normal.                Eyes: . Pupils are equal, round, and reactive to light. Conjunctivae and EOM are normal               Nose: without d/c or deformity               Neck: Neck supple. Gross normal ROM               Cardiovascular: Normal rate and regular rhythm.                 Pulmonary/Chest: Effort normal and breath sounds without rales or wheezing.  Abd:  Soft, NT, ND, + BS, no organomegaly               Spine nontender in the midline, has bilateral paracervical and parathoracic msk tender areas, no rash or swelling               Neurological: Pt is alert. At baseline orientation, motor grossly intact                Skin: Skin is warm. No rashes, no other new lesions, LE edema - none               Psychiatric: Pt behavior is normal without agitation   Micro: none  Cardiac tracings I have personally interpreted today:  none  Pertinent Radiological findings (summarize): none   Lab Results  Component Value Date   WBC 8.7 07/31/2022   HGB 11.2 (L) 07/31/2022   HCT 32.5 (L) 07/31/2022   PLT 167 07/31/2022   GLUCOSE 127 (H) 07/31/2022   CHOL 162 03/20/2022   TRIG 103.0 03/20/2022   HDL 42.30 03/20/2022   LDLCALC 99 03/20/2022   ALT 27 07/31/2022   AST 29 07/31/2022   NA 135 07/31/2022   K 3.0 (L) 07/31/2022   CL 103 07/31/2022   CREATININE 1.27 (H) 07/31/2022   BUN 20 07/31/2022   CO2 25 07/31/2022   TSH 2.47 03/20/2022   PSA 3.04 07/07/2014   HGBA1C 6.5 03/20/2022   MICROALBUR <0.7 03/20/2022   Assessment/Plan:  Joseph Rocha is a 87 y.o. Black or African American [2] male with  has a past medical history of Allergy, Anemia, Diabetes mellitus, Elevated PSA, Hyperlipidemia, Hypertension, Hypogonadism male, OA (osteoarthritis), and Pituitary abnormality ([place]) (2004).  DIZZINESS Etiology unclear, for cxr, labs including cbc today, consider imaging if not improved  Diabetes ([place]) Lab Results  Component Value Date   HGBA1C 6.5 03/20/2022   Stable, pt to continue current medical treatment metformin 500 bid   Thoracic back pain Also for thoracic spine films, consider MRI if persists or worsens  Posterior neck pain C/w msk pain likely underlying c spine djd ddd - for flexeril 5 tid prn, also for xray today  Followup: Return in about 6 months (around 02/20/2023).  Cathlean Cower, MD 08/21/2022 1:07 PM [facility]

## 2022-08-21 NOTE — Assessment & Plan Note (Signed)
Also for thoracic spine films, consider MRI if persists or worsens

## 2022-08-22 ENCOUNTER — Encounter: Payer: Self-pay | Admitting: Internal Medicine

## 2022-08-31 ENCOUNTER — Telehealth: Payer: Self-pay | Admitting: Internal Medicine

## 2022-08-31 NOTE — Telephone Encounter (Signed)
Pt called to report not receiving requested lab results and health info in the mail yet as usual. Confirmed pt address on file is correct: [address]  [place] [id number] Pt verbalized understanding to allow another week to receive documents.

## 2022-09-11 ENCOUNTER — Other Ambulatory Visit: Payer: Self-pay | Admitting: Internal Medicine

## 2022-09-11 MED ORDER — REPAGLINIDE 2 MG PO TABS: 2.0000 mg | ORAL_TABLET | Freq: Two times a day (BID) | ORAL | 3 refills | Status: DC

## 2022-09-12 NOTE — Telephone Encounter (Signed)
Patient called and stated that he still has not received his lab results and health information in the mail. Reconfirmed that we do have the correct address on file.

## 2022-09-12 NOTE — Telephone Encounter (Signed)
Results resent through mail, patient notified

## 2022-09-13 ENCOUNTER — Ambulatory Visit (INDEPENDENT_AMBULATORY_CARE_PROVIDER_SITE_OTHER): Payer: Medicare Other | Admitting: Internal Medicine

## 2022-09-13 ENCOUNTER — Encounter: Payer: Self-pay | Admitting: Internal Medicine

## 2022-09-13 VITALS — BP 120/74 | HR 74 | Ht 75.0 in | Wt 205.0 lb

## 2022-09-13 DIAGNOSIS — N1831 Chronic kidney disease, stage 3a: Secondary | ICD-10-CM | POA: Diagnosis not present

## 2022-09-13 DIAGNOSIS — E1169 Type 2 diabetes mellitus with other specified complication: Secondary | ICD-10-CM | POA: Diagnosis not present

## 2022-09-13 DIAGNOSIS — E1122 Type 2 diabetes mellitus with diabetic chronic kidney disease: Secondary | ICD-10-CM

## 2022-09-13 DIAGNOSIS — E785 Hyperlipidemia, unspecified: Secondary | ICD-10-CM

## 2022-09-13 MED ORDER — METFORMIN HCL 500 MG PO TABS: 500.0000 mg | ORAL_TABLET | Freq: Two times a day (BID) | ORAL | 3 refills | Status: DC

## 2022-09-13 NOTE — Progress Notes (Signed)
Patient ID: Joseph Rocha, male   DOB: July 28, 1935, 87 y.o.   MRN: [id number]  HPI: Joseph Rocha is a 87 y.o.-year-old male, returning for follow-up for DM2, dx in 1988, non-insulin-dependent (on insulin briefly after diagnosis), uncontrolled, with  complications (CKD stage 3a, PN). Pt. previously saw Dr. Loanne Drilling, last visit 6 months ago.  He recently had COVID-19 2 months ago.  He recovered well.  Reviewed HbA1c: Lab Results  Component Value Date   HGBA1C 6.5 08/21/2022   HGBA1C 6.5 03/20/2022   HGBA1C 8.5 (A) 09/21/2021   HGBA1C 6.7 (H) 01/16/2021   HGBA1C 6.5 (H) 07/13/2020   HGBA1C 6.6 (A) 05/23/2020   HGBA1C 9.5 (A) 02/09/2020   HGBA1C 7.5 (A) 09/08/2019   HGBA1C 6.7 (A) 05/18/2019   HGBA1C 8.2 (A) 01/06/2019   Pt is on a regimen of: - Metformin ER 500 mg 2x a day, with meals - Repaglinide 2 mg 2x a day (increased at last visit with Dr. Loanne Drilling)  Pt checks his sugars ~1x a week and they are: - am: 98, 115-140, 150 - 2h after b'fast: n/c - before lunch: n/c - 2h after lunch: n/c - before dinner: n/c - 2h after dinner: n/c - bedtime: n/c - nighttime: n/c Lowest sugar was 98; ? hypoglycemia awareness. Highest sugar was 150.  Glucometer:One Touch  - + CKD, last BUN/creatinine:  Lab Results  Component Value Date   BUN 19 08/21/2022   BUN 20 07/31/2022   CREATININE 1.19 08/21/2022   CREATININE 1.27 (H) 07/31/2022   -+ HL; last set of lipids: Lab Results  Component Value Date   CHOL 162 08/21/2022   HDL 45.30 08/21/2022   LDLCALC 84 08/21/2022   TRIG 163.0 (H) 08/21/2022   CHOLHDL 4 08/21/2022  On Crestor 5 mg daily omega-3 fatty acids 1000 mg daily.  Lipitor caused muscle cramps and pain.  - last eye exam was 01/08/2022 reportedly. No DR.   - + numbness and tingling in his feet.  Last foot exam 03/20/2022.  He also has a history of hypogonadism-on testosterone, HTN, OA, anemia, GERD.  He has hypothyroidism-on levothyroxine 50 mcg daily.   - in am -  fasting mostly - at least 30 min from b'fast - no calcium - + iron - in am - no multivitamins - + PPIs - Omeprazole in am - not on Biotin  TFTs reviewed and are controlled: Lab Results  Component Value Date   TSH 3.13 08/21/2022   TSH 2.47 03/20/2022   TSH 2.65 01/16/2021   TSH 3.34 07/13/2020   TSH 0.83 02/09/2020   TSH 1.80 07/16/2017   TSH 1.65 08/15/2016   TSH 2.00 07/26/2016   TSH 4.65 (H) 04/06/2016   TSH 4.93 (H) 07/27/2015   ROS: + see HPI No increased urination, blurry vision, nausea, chest pain.  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   Past Surgical History:  Procedure Laterality Date   DOPPLER ECHOCARDIOGRAPHY  12/04/2001   ELECTROCARDIOGRAM  03/25/2006   EP IMPLANTABLE DEVICE N/A 03/16/2016   Procedure: Loop Recorder Insertion;  Surgeon: Deboraha Sprang, MD;  Location: [facility];  Service: Cardiovascular;  Laterality: N/A;   KNEE ARTHROSCOPY Left    LUMBAR DISC SURGERY     MENISCUS  REPAIR Right may 2015   TONSILLECTOMY     TRANSPHENOIDAL / TRANSNASAL HYPOPHYSECTOMY / RESECTION PITUITARY TUMOR  2005   Social History   Socioeconomic History   Marital status: Married    Spouse name: Not on file   Number of children: 2   Years of education: Not on file   Highest education level: Not on file  Occupational History   Occupation: retired    Fish farm manager: RETIRED  Tobacco Use   Smoking status: Never   Smokeless tobacco: Never  Vaping Use   Vaping Use: Never used  Substance and Sexual Activity   Alcohol use: No   Drug use: No   Sexual activity: Not Currently  Other Topics Concern   Not on file  Social History Narrative   Lives with wife.  They have two grown children.   He is retired from the [employer].   Social Determinants of Health   Financial Resource Strain:  Low Risk  (06/08/2022)   Overall Financial Resource Strain (CARDIA)    Difficulty of Paying Living Expenses: Not hard at all  Food Insecurity: No Food Insecurity (06/08/2022)   Hunger Vital Sign    Worried About Running Out of Food in the Last Year: Never true    Ran Out of Food in the Last Year: Never true  Transportation Needs: No Transportation Needs (06/08/2022)   PRAPARE - Hydrologist (Medical): No    Lack of Transportation (Non-Medical): No  Physical Activity: Inactive (06/08/2022)   Exercise Vital Sign    Days of Exercise per Week: 0 days    Minutes of Exercise per Session: 0 min  Stress: No Stress Concern Present (06/08/2022)   [place]    Feeling of Stress : Not at all  Social Connections: [place] (06/08/2022)   Social Connection and Isolation Panel [NHANES]    Frequency of Communication with Friends and Family: Three times a week    Frequency of Social Gatherings with Friends and Family: Three times a week    Attends Religious Services: More than 4 times per year    Active Member of Clubs or Organizations: Yes    Attends Archivist Meetings: More than 4 times per year    Marital Status: Married  Human resources officer Violence: Not At Risk (06/08/2022)   Humiliation, Afraid, Rape, and Kick questionnaire    Fear of Current or Ex-Partner: No    Emotionally Abused: No    Physically Abused: No    Sexually Abused: No   Current Outpatient Medications on File Prior to Visit  Medication Sig Dispense Refill   aspirin EC 81 MG tablet Take 1 tablet (81 mg total) by mouth daily. Swallow whole. 30 tablet 11   calcium carbonate (OS-CAL) 600 MG TABS Take 600 mg by mouth daily.     cetirizine-pseudoephedrine (ZYRTEC-D) 5-120 MG tablet Take 1 tablet by mouth daily. 90 tablet 2   ciprofloxacin-dexamethasone (CIPRODEX) OTIC suspension Place 4 drops into the left ear 2 (two) times daily. 7.5  mL 0   diphenoxylate-atropine (LOMOTIL) 2.5-0.025 MG tablet Take 1 tablet by mouth 4 (four) times daily as needed for diarrhea or loose stools. 30 tablet 1   finasteride (PROSCAR) 5 MG tablet TAKE 1 TABLET DAILY 90 tablet 1   fludrocortisone (FLORINEF) 0.1 MG tablet TAKE 1 TABLET DAILY 90 tablet 1   fluticasone (FLONASE) 50 MCG/ACT nasal spray Place 2 sprays into both nostrils daily. [place]  g 6   folic acid (FOLVITE) 1 MG tablet TAKE 1 TABLET DAILY 90 tablet 3   IRON PO Take by mouth.     KLOR-CON M20 20 MEQ tablet Take 20 mEq by mouth daily.      levothyroxine (SYNTHROID) 50 MCG tablet Take 1 tablet (50 mcg total) by mouth daily. 90 tablet 1   loperamide (IMODIUM A-D) 2 MG tablet Take 1 tablet (2 mg total) by mouth 2 (two) times daily as needed for diarrhea or loose stools. 30 tablet 3   metFORMIN (GLUCOPHAGE) 500 MG tablet TAKE 1 TABLET TWICE DAILY  WITH MEALS 180 tablet 3   methocarbamol (ROBAXIN) 500 MG tablet 1 tab by mouth at bedtime as needed 90 tablet 1   Multiple Vitamins-Minerals (CENTRUM SILVER PO) Take 1 tablet by mouth daily.     Omega-3 Fatty Acids (FISH OIL) 1000 MG CAPS Take 1 capsule by mouth daily.     omeprazole (PRILOSEC) 40 MG capsule TAKE 1 CAPSULE DAILY 90 capsule 1   ondansetron (ZOFRAN-ODT) 4 MG disintegrating tablet Take 1 tablet (4 mg total) by mouth every 8 (eight) hours as needed for nausea or vomiting. 20 tablet 0   repaglinide (PRANDIN) 2 MG tablet Take 1 tablet (2 mg total) by mouth 2 (two) times daily before a meal. 180 tablet 3   rosuvastatin (CRESTOR) 5 MG tablet TAKE 1 TABLET DAILY 90 tablet 3   Tamsulosin HCl (FLOMAX) 0.4 MG CAPS Take 0.4 mg by mouth daily.     Testosterone 30 MG/ACT SOLN Place 2 Pump onto the skin daily.     traMADol (ULTRAM) 50 MG tablet Take 1 tablet (50 mg total) by mouth every 6 (six) hours as needed. 15 tablet 0   triamcinolone (NASACORT) 55 MCG/ACT AERO nasal inhaler Place 2 sprays into the nose daily. 1 each 12   No current  facility-administered medications on file prior to visit.   Allergies  Allergen Reactions   Lipitor [Atorvastatin] Other (See Comments)    Myalgias, cramps in hand   Family History  Problem Relation Age of Onset   Diabetes Mellitus II Mother        Deceased, 40s   Heart attack Father    Cancer Brother        uncertain type   Healthy Daughter    Colon cancer Neg Hx    Esophageal cancer Neg Hx    Liver cancer Neg Hx    Pancreatic cancer Neg Hx    Stomach cancer Neg Hx    PE: BP 120/74 (BP Location: Right Arm, Patient Position: Sitting, Cuff Size: Normal)   Pulse 74   Ht '6\' 3"'$  (1.905 m)   Wt 205 lb (93 kg)   SpO2 99%   BMI 25.62 kg/m  Wt Readings from Last 3 Encounters:  09/13/22 205 lb (93 kg)  08/21/22 207 lb (93.9 kg)  07/10/22 208 lb (94.3 kg)   Constitutional: Slightly overweight, in NAD Eyes:  EOMI, no exophthalmos ENT: no neck masses, no cervical lymphadenopathy Cardiovascular: RRR, No MRG Respiratory: CTA B Musculoskeletal: no deformities Skin:no rashes Neurological: no tremor with outstretched hands  ASSESSMENT: 1. DM2, non-insulin-dependent, controlled, with complications - CKD stage 3a - PN  2. HL  3. Hypothyroidism  PLAN:  1. Patient with long-standing, fairly well controlled diabetes, on oral antidiabetic regimen with meglitinide and metformin ER.  Latest HbA1c was at goal, at 6.5%, improved from 8.5% at last visit with Dr. Loanne Drilling. -At today's visit, patient is checking sugars  only in the morning, and only approximately once a week.  We discussed about the importance of checking every day or every other day.  It is ok to only take in the morning for now, though.  Sugars are at or slightly above target, however, this is acceptable, due to age. -At today's visit we discussed about the possibility of changing his repaglinide to an SGLT2 inhibitor due to his history of CKD.  However, he tells me that he cannot afford brand name medications.  This would be  a consideration in the future, as we cannot go through the patient assistance program.  For now, he would prefer to continue with our current regimen. -I refilled his metformin. - I suggested to:  Patient Instructions  Please continue: - Metformin ER 500 mg 2x a day, with meals - Repaglinide 2 mg 2x a day  Please continue Levothyroxine 50 mcg daily.  Take the thyroid hormone every day, with water, at least 30 minutes before breakfast, separated by at least 4 hours from: - acid reflux medications - calcium - iron - multivitamins  Move iron and Omeprazole 4h after Levothyroxine.  Please return for another visit in 6 months.  - check sugars at different times of the day - check 1x a day, rotating checks - discussed about CBG targets for treatment: 80-130 mg/dL before meals and <180 mg/dL after meals; target HbA1c <7.4% (due to age). - given foot care handout  - given instructions for hypoglycemia management "15-15 rule"  - advised for yearly eye exams  - Return to clinic in 3-4 months  2. HL - Reviewed latest lipid panel from last month: DL above our target of less than 55 due to history of cardiovascular disease, triglycerides only slightly high: Lab Results  Component Value Date   CHOL 162 08/21/2022   HDL 45.30 08/21/2022   LDLCALC 84 08/21/2022   TRIG 163.0 (H) 08/21/2022   CHOLHDL 4 08/21/2022  - Continues omega-3 fatty acids and Crestor 5 mg daily without side effects.  Lipitor caused muscle cramps and pain in the past.  3. Hypothyroidism - latest thyroid labs reviewed with pt. >> normal: Lab Results  Component Value Date   TSH 3.13 08/21/2022  - he continues on LT4 50 mcg daily - pt feels good on this dose. - we discussed about taking the thyroid hormone every day, with water, >30 minutes before breakfast, separated by >4 hours from acid reflux medications, calcium, iron, multivitamins. Pt. Is not taking it correctly: He takes it mostly before meals but occasionally  after meals-I advised him to only take it 30 minutes before breakfast.  He takes iron and omeprazole in the morning and I advised him to move these at least 4 hours later. - will check his thyroid tests at next visit  - Total time spent for the visit: 40 min, in pre- and post-charting, reviewing Dr. Cordelia Pen last note, obtaining medical information from the chart and from the pt, reviewing his  previous labs, evaluations, and treatments, reviewing his symptoms, counseling him about his diabetes and thyroid condition (please see the discussed topics above), and developing a plan to further evaluate and treat him.  Philemon Kingdom, MD PhD [facility]

## 2022-09-13 NOTE — Patient Instructions (Addendum)
Please continue: - Metformin ER 500 mg 2x a day, with meals - Repaglinide 2 mg 2x a day  Please continue Levothyroxine 50 mcg daily.  Take the thyroid hormone every day, with water, at least 30 minutes before breakfast, separated by at least 4 hours from: - acid reflux medications - calcium - iron - multivitamins  Move iron and Omeprazole 4h after Levothyroxine.  Please return for another visit in 6 months.  PATIENT INSTRUCTIONS FOR TYPE 2 DIABETES:  **Please join MyChart!** - see attached instructions about how to join if you have not done so already.  DIET AND EXERCISE Diet and exercise is an important part of diabetic treatment.  We recommended aerobic exercise in the form of brisk walking (working between 40-60% of maximal aerobic capacity, similar to brisk walking) for 150 minutes per week (such as 30 minutes five days per week) along with 3 times per week performing 'resistance' training (using various gauge rubber tubes with handles) 5-10 exercises involving the major muscle groups (upper body, lower body and core) performing 10-15 repetitions (or near fatigue) each exercise. Start at half the above goal but build slowly to reach the above goals. If limited by weight, joint pain, or disability, we recommend daily walking in a swimming pool with water up to waist to reduce pressure from joints while allow for adequate exercise.    BLOOD GLUCOSES Monitoring your blood glucoses is important for continued management of your diabetes. Please check your blood glucoses 2-4 times a day: fasting, before meals and at bedtime (you can rotate these measurements - e.g. one day check before the 3 meals, the next day check before 2 of the meals and before bedtime, etc.).   HYPOGLYCEMIA (low blood sugar) Hypoglycemia is usually a reaction to not eating, exercising, or taking too much insulin/ other diabetes drugs.  Symptoms include tremors, sweating, hunger, confusion, headache, etc. Treat  IMMEDIATELY with 15 grams of Carbs: 4 glucose tablets  cup regular juice/soda 2 tablespoons raisins 4 teaspoons sugar 1 tablespoon honey Recheck blood glucose in 15 mins and repeat above if still symptomatic/blood glucose <100.  RECOMMENDATIONS TO REDUCE YOUR RISK OF DIABETIC COMPLICATIONS: * Take your prescribed MEDICATION(S) * Follow a DIABETIC diet: Complex carbs, fiber rich foods, (monounsaturated and polyunsaturated) fats * AVOID saturated/trans fats, high fat foods, >2,300 mg salt per day. * EXERCISE at least 5 times a week for 30 minutes or preferably daily.  * DO NOT SMOKE OR DRINK more than 1 drink a day. * Check your FEET every day. Do not wear tightfitting shoes. Contact us if you develop an ulcer * See your EYE doctor once a year or more if needed * Get a FLU shot once a year * Get a PNEUMONIA vaccine once before and once after age 3 years  GOALS:  * Your Hemoglobin A1c of <7%  * fasting sugars need to be 80-130 * after meals sugars need to be <180 (2h after you start eating) * Your Systolic BP should be 557 or lower  * Your Diastolic BP should be 80 or lower  * Your HDL (Good Cholesterol) should be 40 or higher  * Your LDL (Bad Cholesterol) should be ideally <70. * Your Triglycerides should be 150 or lower  * Your Urine microalbumin (kidney function) should be <30 * Your Body Mass Index should be 25 or lower   Please consider the following ways to cut down carbs and fat and increase fiber and micronutrients in your diet: - substitute  whole grain for white bread or pasta - substitute brown rice for white rice - substitute 90-calorie flat bread pieces for slices of bread when possible - substitute sweet potatoes or yams for white potatoes - substitute humus for margarine - substitute tofu for cheese when possible - substitute almond or rice milk for regular milk (would not drink soy milk daily due to concern for soy estrogen influence on breast cancer risk) -  substitute dark chocolate for other sweets when possible - substitute water - can add lemon or orange slices for taste - for diet sodas (artificial sweeteners will trick your body that you can eat sweets without getting calories and will lead you to overeating and weight gain in the long run) - do not skip breakfast or other meals (this will slow down the metabolism and will result in more weight gain over time)  - can try smoothies made from fruit and almond/rice milk in am instead of regular breakfast - can also try old-fashioned (not instant) oatmeal made with almond/rice milk in am - order the dressing on the side when eating salad at a restaurant (pour less than half of the dressing on the salad) - eat as little meat as possible - can try juicing, but should not forget that juicing will get rid of the fiber, so would alternate with eating raw veg./fruits or drinking smoothies - use as little oil as possible, even when using olive oil - can dress a salad with a mix of balsamic vinegar and lemon juice, for e.g. - use agave nectar, stevia sugar, or regular sugar rather than artificial sweateners - steam or broil/roast veggies  - snack on veggies/fruit/nuts (unsalted, preferably) when possible, rather than processed foods - reduce or eliminate aspartame in diet (it is in diet sodas, chewing gum, etc) Read the labels!  Try to read Dr. Janene Harvey book: "Program for Reversing Diabetes" for other ideas for healthy eating.

## 2022-09-18 ENCOUNTER — Ambulatory Visit: Payer: Medicare Other | Admitting: Internal Medicine

## 2022-09-25 ENCOUNTER — Ambulatory Visit (INDEPENDENT_AMBULATORY_CARE_PROVIDER_SITE_OTHER): Payer: Medicare Other | Admitting: Internal Medicine

## 2022-09-25 VITALS — BP 98/68 | HR 42 | Temp 97.6°F | Ht 75.0 in | Wt 209.0 lb

## 2022-09-25 DIAGNOSIS — J189 Pneumonia, unspecified organism: Secondary | ICD-10-CM | POA: Diagnosis not present

## 2022-09-25 MED ORDER — PREDNISONE 20 MG PO TABS: 40.0000 mg | ORAL_TABLET | Freq: Every day | ORAL | 0 refills | Status: DC

## 2022-09-25 MED ORDER — PROMETHAZINE-DM 6.25-15 MG/5ML PO SYRP: 5.0000 mL | ORAL_SOLUTION | Freq: Two times a day (BID) | ORAL | 0 refills | Status: DC | PRN

## 2022-09-25 MED ORDER — AZITHROMYCIN 250 MG PO TABS: ORAL_TABLET | ORAL | 0 refills | Status: AC

## 2022-09-25 NOTE — Patient Instructions (Addendum)
We have sent in cough medicine to use up to twice a day.  We have sent in azithromycin (z-pack) to take 2 pills today, then 1 pill a day until gone starting tomorrow.  We have sent in prednisone to take 2 pills daily for 5 days to help.

## 2022-09-25 NOTE — Progress Notes (Unsigned)
   Subjective:   Patient ID: Joseph Rocha, male    DOB: [birth year], 87 y.o.   MRN: [id number]  HPI The patient is an 87 YO man coming in for sinus issues.   Review of Systems  Objective:  Physical Exam  Vitals:   09/25/22 1533  BP: 98/68  Pulse: (!) 42  Temp: 97.6 F (36.4 C)  TempSrc: Oral  SpO2: 96%  Weight: 209 lb (94.8 kg)  Height: '6\' 3"'$  (1.905 m)    Assessment & Plan:

## 2022-09-26 ENCOUNTER — Telehealth: Payer: Self-pay | Admitting: Internal Medicine

## 2022-09-26 ENCOUNTER — Encounter: Payer: Self-pay | Admitting: Internal Medicine

## 2022-09-26 NOTE — Telephone Encounter (Signed)
Patient called back and would like Dr. Gwynn Burly nurse to give him a call.

## 2022-09-26 NOTE — Assessment & Plan Note (Signed)
Suspected CAP given sputum and SOB and worsening symptoms. Rx azithromycin 5 day course and prednisone 5 day course and promethazine/dm cough syrup to help. If no improvement or worsening return or call back.

## 2022-09-26 NOTE — Telephone Encounter (Signed)
Patient was seen yesterday with a sinus infection - he states that he is still congested and feeling dizzy when he gets up.  Please advise.

## 2022-10-01 ENCOUNTER — Telehealth: Payer: Self-pay | Admitting: Internal Medicine

## 2022-10-01 NOTE — Telephone Encounter (Signed)
Spoke with patient and confirmed current symptoms which includes congestion in head and chest, possibly  light headed due to promethazine-dm and is requesting something different along with a refill for prednisone.

## 2022-10-01 NOTE — Telephone Encounter (Signed)
Patient states he was told that something was being sent to pharmacy. He does not know the name but the pharmacy does not have anything from [place]. He states Mucinex is not helping. Please call patient at [phone] to clarify.

## 2022-10-01 NOTE — Telephone Encounter (Signed)
Spoke with PT today. They are still experiencing these symptoms. Was able to get PT scheduled for next available appointment with Dr.John on Wednesday.  PT is currently out of all medications that were prescribed by Dr.Crawford during their last visit. PT was wanting to know if a refill on some of these would be in order since PT is still dealing with symptoms?  PT is experiencing the dizziness and believes it might be from one of the new prescriptions ( PT believes it might be from the promethazine-dextromethorphan (PROMETHAZINE-DM) 6.25-15 MG/5ML syrup )  CB for PT: [phone]

## 2022-10-01 NOTE — Telephone Encounter (Signed)
Patient called demanding Dr. Jenny Reichmann call him back. Patient is requesting new medication to be called into his [place], [place]. Patient states that he is still coughing up yellow mucus and the medication Robitussin isn't working.

## 2022-10-01 NOTE — Telephone Encounter (Signed)
I am not sure what to say from this information, but ok for Mucinex twice per day until appt, thanks

## 2022-10-02 MED ORDER — LEVOFLOXACIN 500 MG PO TABS: 500.0000 mg | ORAL_TABLET | Freq: Every day | ORAL | 0 refills | Status: DC

## 2022-10-02 NOTE — Telephone Encounter (Signed)
Pt recently seen per Dr Sharlet Salina with [facility] for levaquin 500 qd c 10 days - done erx

## 2022-10-02 NOTE — Telephone Encounter (Signed)
Spoke with patient and documented in a previous encounter.

## 2022-10-02 NOTE — Telephone Encounter (Signed)
Patient states he is still coughing up yellow mucous and is requesting abx if possible, please advise

## 2022-10-03 ENCOUNTER — Ambulatory Visit (INDEPENDENT_AMBULATORY_CARE_PROVIDER_SITE_OTHER): Payer: Medicare Other

## 2022-10-03 ENCOUNTER — Ambulatory Visit (INDEPENDENT_AMBULATORY_CARE_PROVIDER_SITE_OTHER): Payer: Medicare Other | Admitting: Internal Medicine

## 2022-10-03 ENCOUNTER — Encounter: Payer: Self-pay | Admitting: Internal Medicine

## 2022-10-03 VITALS — BP 122/68 | HR 65 | Temp 98.0°F | Ht 75.0 in | Wt 206.0 lb

## 2022-10-03 DIAGNOSIS — R0989 Other specified symptoms and signs involving the circulatory and respiratory systems: Secondary | ICD-10-CM

## 2022-10-03 DIAGNOSIS — R059 Cough, unspecified: Secondary | ICD-10-CM

## 2022-10-03 DIAGNOSIS — E1165 Type 2 diabetes mellitus with hyperglycemia: Secondary | ICD-10-CM

## 2022-10-03 DIAGNOSIS — R062 Wheezing: Secondary | ICD-10-CM

## 2022-10-03 LAB — POC SOFIA SARS ANTIGEN FIA: SARS Coronavirus 2 Ag: NEGATIVE

## 2022-10-03 LAB — POC INFLUENZA A&B (BINAX/QUICKVUE)
Influenza A, POC: NEGATIVE
Influenza B, POC: NEGATIVE

## 2022-10-03 LAB — POCT RESPIRATORY SYNCYTIAL VIRUS: RSV Rapid Ag: NEGATIVE

## 2022-10-03 MED ORDER — LEVOFLOXACIN 500 MG PO TABS: 500.0000 mg | ORAL_TABLET | Freq: Every day | ORAL | 0 refills | Status: AC

## 2022-10-03 MED ORDER — CEFTRIAXONE SODIUM 500 MG IJ SOLR
500.0000 mg | Freq: Once | INTRAMUSCULAR | Status: AC
  Administered 2022-10-03: 500 mg via INTRAMUSCULAR

## 2022-10-03 MED ORDER — ALBUTEROL SULFATE HFA 108 (90 BASE) MCG/ACT IN AERS: 2.0000 | INHALATION_SPRAY | Freq: Four times a day (QID) | RESPIRATORY_TRACT | 0 refills | Status: DC | PRN

## 2022-10-03 MED ORDER — HYDROCODONE BIT-HOMATROP MBR 5-1.5 MG/5ML PO SOLN: 5.0000 mL | Freq: Four times a day (QID) | ORAL | 0 refills | Status: AC | PRN

## 2022-10-03 MED ORDER — METHYLPREDNISOLONE ACETATE 80 MG/ML IJ SUSP
80.0000 mg | Freq: Once | INTRAMUSCULAR | Status: AC
  Administered 2022-10-03: 80 mg via INTRAMUSCULAR

## 2022-10-03 MED ORDER — PREDNISONE 10 MG PO TABS: ORAL_TABLET | ORAL | 0 refills | Status: DC

## 2022-10-03 MED ORDER — GUAIFENESIN ER 600 MG PO TB12: 1200.0000 mg | ORAL_TABLET | Freq: Two times a day (BID) | ORAL | 1 refills | Status: DC | PRN

## 2022-10-03 MED ORDER — CEFTRIAXONE SODIUM 1 G IJ SOLR: 1.0000 g | Freq: Once | INTRAMUSCULAR | Status: DC

## 2022-10-03 NOTE — Patient Instructions (Addendum)
You had the antibiotic, and steroid shots today  Please take all new medication as prescribed - the antibiotic, cough medicine as needed, prednisone, mucinex, and inhaler as needed  Please continue all other medications as before, and refills have been done if requested.  Please have the pharmacy call with any other refills you may need.  Please keep your appointments with your specialists as you may have planned  Please go to the XRAY Department in the first floor for the x-ray testing  You will be contacted by phone if any changes need to be made immediately.  Otherwise, you will receive a letter about your results with an explanation, but please check with MyChart first.  Please make an Appointment to return in Feb 21, 2023, or sooner if needed

## 2022-10-03 NOTE — Assessment & Plan Note (Signed)
Lab Results  Component Value Date   HGBA1C 6.5 08/21/2022   Stable, pt to continue current medical treatment metformin 500 bid, prandin 2 mg bid

## 2022-10-03 NOTE — Progress Notes (Signed)
Patient ID: [name], male   DOB: Jun 29, 1935, 87 y.o.   MRN: [id number]        Chief Complaint: follow up cough, wheezing sob, dm       HPI:  Joseph Rocha is a 87 y.o. male here after after recent tx with antibiotic, prednisone, and seemed better for a few days, but now much worse again.  Here with acute onset mild to mod 2-3 days ST, HA, general weakness and malaise, with prod cough greenish sputum, wheezing, sob and doe but Pt denies chest pain, orthopnea, PND, increased LE swelling, palpitations, dizziness or syncope.   Pt denies polydipsia, polyuria, or new focal neuro s/s.    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 He has never used smokeless tobacco. He reports that he does not drink alcohol and does not use drugs. family history includes Cancer in his brother; Diabetes Mellitus II in his mother; Healthy in his daughter; Heart attack in his father. Allergies  Allergen Reactions    Lipitor [Atorvastatin] Other (See Comments)    Myalgias, cramps in hand   Current Outpatient Medications on File Prior to Visit  Medication Sig Dispense Refill   aspirin EC 81 MG tablet Take 1 tablet (81 mg total) by mouth daily. Swallow whole. 30 tablet 11   calcium carbonate (OS-CAL) 600 MG TABS Take 600 mg by mouth daily.     cetirizine-pseudoephedrine (ZYRTEC-D) 5-120 MG tablet Take 1 tablet by mouth daily. 90 tablet 2   ciprofloxacin-dexamethasone (CIPRODEX) OTIC suspension Place 4 drops into the left ear 2 (two) times daily. 7.5 mL 0   diphenoxylate-atropine (LOMOTIL) 2.5-0.025 MG tablet Take 1 tablet by mouth 4 (four) times daily as needed for diarrhea or loose stools. 30 tablet 1   finasteride (PROSCAR) 5 MG tablet TAKE 1 TABLET DAILY 90 tablet 1   fludrocortisone (FLORINEF) 0.1 MG tablet TAKE 1 TABLET DAILY 90 tablet 1   fluticasone (FLONASE) 50 MCG/ACT nasal spray Place 2 sprays into both nostrils daily. 16 g 6   folic acid (FOLVITE) 1 MG tablet TAKE 1 TABLET DAILY 90 tablet 3   IRON PO Take by mouth.     KLOR-CON M20 20 MEQ tablet Take 20 mEq by mouth daily.      levothyroxine (SYNTHROID)  50 MCG tablet Take 1 tablet (50 mcg total) by mouth daily. 90 tablet 1   loperamide (IMODIUM A-D) 2 MG tablet Take 1 tablet (2 mg total) by mouth 2 (two) times daily as needed for diarrhea or loose stools. 30 tablet 3   metFORMIN (GLUCOPHAGE) 500 MG tablet Take 1 tablet (500 mg total) by mouth 2 (two) times daily with a meal. 180 tablet 3   methocarbamol (ROBAXIN) 500 MG tablet 1 tab by mouth at bedtime as needed 90 tablet 1   Multiple Vitamins-Minerals (CENTRUM SILVER PO) Take 1 tablet by mouth daily.     Omega-3 Fatty Acids (FISH OIL) 1000 MG CAPS Take 1 capsule by mouth daily.     omeprazole (PRILOSEC) 40 MG capsule TAKE 1 CAPSULE DAILY 90 capsule 1   ondansetron (ZOFRAN-ODT) 4 MG disintegrating tablet Take 1 tablet (4 mg total) by mouth every 8 (eight) hours as needed for nausea or vomiting.  20 tablet 0   promethazine-dextromethorphan (PROMETHAZINE-DM) 6.25-15 MG/5ML syrup Take 5 mLs by mouth 2 (two) times daily as needed. 118 mL 0   repaglinide (PRANDIN) 2 MG tablet Take 1 tablet (2 mg total) by mouth 2 (two) times daily before a meal. 180 tablet 3   rosuvastatin (CRESTOR) 5 MG tablet TAKE 1 TABLET DAILY 90 tablet 3   Tamsulosin HCl (FLOMAX) 0.4 MG CAPS Take 0.4 mg by mouth daily.     Testosterone 30 MG/ACT SOLN Place 2 Pump onto the skin daily.     traMADol (ULTRAM) 50 MG tablet Take 1 tablet (50 mg total) by mouth every 6 (six) hours as needed. 15 tablet 0   triamcinolone (NASACORT) 55 MCG/ACT AERO nasal inhaler Place 2 sprays into the nose daily. 1 each 12   No current facility-administered medications on file prior to visit.        ROS:  All others reviewed and negative.  Objective        PE:  BP 122/68 (BP Location: Left Arm, Patient Position: Sitting, Cuff Size: Large)   Pulse 65   Temp 98 F (36.7 C) (Oral)   Ht '6\' 3"'$  (1.905 m)   Wt 206 lb (93.4 kg)   SpO2 93%   BMI 25.75 kg/m                 Constitutional: Pt appears in NAD, mild ill               HENT: Head: NCAT.                Right Ear: External ear normal.                 Left Ear: External ear normal. Bilat tm's with mild erythema.  Max sinus areas mild tender.  Pharynx with mild erythema, no exudate               Eyes: . Pupils are equal, round, and reactive to light. Conjunctivae and EOM are normal               Nose: without d/c or deformity               Neck: Neck supple. Gross normal ROM               Cardiovascular: Normal rate and regular rhythm.                 Pulmonary/Chest: Effort normal and breath sounds without rales but with mild bilateral wheezing.  Abd:  Soft, NT, ND, + BS, no organomegaly               Neurological: Pt is alert. At baseline orientation, motor grossly intact               Skin: Skin is warm. No rashes, no other new lesions, LE edema - none                Psychiatric: Pt behavior is normal without agitation   Micro: none  Cardiac tracings I have personally interpreted today:  none  Pertinent Radiological findings (summarize): none   Lab Results  Component Value Date   WBC 6.5 08/21/2022   HGB 12.4 (L) 08/21/2022   HCT 36.7 (L) 08/21/2022   PLT 197.0 08/21/2022   GLUCOSE 118 (H) 08/21/2022   CHOL 162 08/21/2022   TRIG 163.0 (H) 08/21/2022   HDL 45.30 08/21/2022   LDLCALC 84 08/21/2022   ALT 16 08/21/2022   AST 18 08/21/2022   NA 138 08/21/2022   K 3.9 08/21/2022   CL 102 08/21/2022   CREATININE 1.19 08/21/2022   BUN 19 08/21/2022   CO2 31 08/21/2022   TSH 3.13 08/21/2022   PSA 3.04 07/07/2014   HGBA1C 6.5 08/21/2022   MICROALBUR <0.7 08/21/2022   POCT today - covid neg, flu a/b - neg, RSV - neg  Assessment/Plan:  Joseph Rocha is a 87 y.o. Black or African American [2] male with  has a past medical history of Allergy, Anemia, Diabetes mellitus, Elevated PSA, Hyperlipidemia, Hypertension, Hypogonadism male, OA (osteoarthritis), and Pituitary abnormality ([place]) (2004).  Diabetes [facility]) Lab Results  Component Value Date   HGBA1C 6.5 08/21/2022   Stable, pt to continue current medical treatment metformin 500 bid, prandin 2 mg bid   Cough Mild to mod worsening again, c/w bronchitis or pna, for cxr, for rocephin 1 gm Im now, antibx course - levaquin 500 qd, cough med prn, to f/u any worsening symptoms or concerns  Wheezing Mild to mod, for albuterol inhaler prn, depomedrol mg 80, qd, prednisone taper, mucinex bid prn,  to f/u any worsening symptoms or concerns  Followup: Return in about 5 months (around 02/21/2023).  Cathlean Cower, MD 10/03/2022 7:49 PM [facility]

## 2022-10-03 NOTE — Assessment & Plan Note (Signed)
Mild to mod, for albuterol inhaler prn, depomedrol mg 80, qd, prednisone taper, mucinex bid prn,  to f/u any worsening symptoms or concerns

## 2022-10-03 NOTE — Assessment & Plan Note (Signed)
Mild to mod worsening again, c/w bronchitis or pna, for cxr, for rocephin 1 gm Im now, antibx course - levaquin 500 qd, cough med prn, to f/u any worsening symptoms or concerns

## 2022-10-05 ENCOUNTER — Encounter: Payer: Self-pay | Admitting: Internal Medicine

## 2022-10-05 ENCOUNTER — Telehealth: Payer: Self-pay

## 2022-10-05 NOTE — Telephone Encounter (Signed)
PA started   Key: [id number]

## 2022-10-08 DIAGNOSIS — N401 Enlarged prostate with lower urinary tract symptoms: Secondary | ICD-10-CM | POA: Diagnosis not present

## 2022-10-08 DIAGNOSIS — R3912 Poor urinary stream: Secondary | ICD-10-CM | POA: Diagnosis not present

## 2022-10-08 DIAGNOSIS — E291 Testicular hypofunction: Secondary | ICD-10-CM | POA: Diagnosis not present

## 2022-10-08 NOTE — Progress Notes (Signed)
Letter printed and send out

## 2022-10-12 ENCOUNTER — Other Ambulatory Visit: Payer: Self-pay | Admitting: Internal Medicine

## 2022-10-12 NOTE — Telephone Encounter (Signed)
PA was send and rejected   Key: [id number]

## 2022-10-12 NOTE — Telephone Encounter (Signed)
Please refill as per office routine med refill policy (all routine meds to be refilled for 3 mo or monthly (per pt preference) up to one year from last visit, then month to month grace period for 3 mo, then further med refills will have to be denied) ? ?

## 2022-11-09 ENCOUNTER — Other Ambulatory Visit: Payer: Self-pay

## 2022-11-09 DIAGNOSIS — E119 Type 2 diabetes mellitus without complications: Secondary | ICD-10-CM

## 2022-11-09 MED ORDER — LEVOTHYROXINE SODIUM 50 MCG PO TABS: 50.0000 ug | ORAL_TABLET | Freq: Every day | ORAL | 1 refills | Status: DC

## 2022-12-31 DIAGNOSIS — H5203 Hypermetropia, bilateral: Secondary | ICD-10-CM | POA: Diagnosis not present

## 2022-12-31 DIAGNOSIS — E119 Type 2 diabetes mellitus without complications: Secondary | ICD-10-CM | POA: Diagnosis not present

## 2022-12-31 LAB — HM DIABETES EYE EXAM

## 2023-01-15 ENCOUNTER — Ambulatory Visit (INDEPENDENT_AMBULATORY_CARE_PROVIDER_SITE_OTHER): Payer: Medicare Other | Admitting: Internal Medicine

## 2023-01-15 VITALS — BP 122/64 | HR 64 | Temp 98.8°F | Ht 75.0 in | Wt 206.0 lb

## 2023-01-15 DIAGNOSIS — R6889 Other general symptoms and signs: Secondary | ICD-10-CM

## 2023-01-15 DIAGNOSIS — E1165 Type 2 diabetes mellitus with hyperglycemia: Secondary | ICD-10-CM

## 2023-01-15 DIAGNOSIS — E559 Vitamin D deficiency, unspecified: Secondary | ICD-10-CM

## 2023-01-15 DIAGNOSIS — R062 Wheezing: Secondary | ICD-10-CM

## 2023-01-15 DIAGNOSIS — R051 Acute cough: Secondary | ICD-10-CM

## 2023-01-15 LAB — POCT RESPIRATORY SYNCYTIAL VIRUS: RSV Rapid Ag: NEGATIVE

## 2023-01-15 LAB — POC INFLUENZA A&B (BINAX/QUICKVUE)
Influenza A, POC: NEGATIVE
Influenza B, POC: NEGATIVE

## 2023-01-15 LAB — POC SOFIA SARS ANTIGEN FIA: SARS Coronavirus 2 Ag: NEGATIVE

## 2023-01-15 MED ORDER — METHYLPREDNISOLONE ACETATE 80 MG/ML IJ SUSP
80.0000 mg | Freq: Once | INTRAMUSCULAR | Status: AC
  Administered 2023-01-15: 80 mg via INTRAMUSCULAR

## 2023-01-15 MED ORDER — PREDNISONE 10 MG PO TABS: ORAL_TABLET | ORAL | 0 refills | Status: DC

## 2023-01-15 MED ORDER — LEVOFLOXACIN 500 MG PO TABS: 500.0000 mg | ORAL_TABLET | Freq: Every day | ORAL | 0 refills | Status: AC

## 2023-01-15 MED ORDER — HYDROCODONE BIT-HOMATROP MBR 5-1.5 MG/5ML PO SOLN: 5.0000 mL | Freq: Four times a day (QID) | ORAL | 0 refills | Status: AC | PRN

## 2023-01-15 NOTE — Progress Notes (Unsigned)
Patient ID: Joseph Rocha, male   DOB: 1935-01-26, 88 y.o.   MRN: [id number]        Chief Complaint: follow up cough, wheezing, dm, low vit d       HPI:  Joseph Rocha is a 88 y.o. male Here with acute onset mild to mod 4 days ST, HA, general weakness and malaise, with prod cough greenish sputum, but Pt denies chest pain, increased sob or doe, wheezing, orthopnea, PND, increased LE swelling, palpitations, dizziness or syncope, except onset wheezing and sob x 2 days.   Pt denies polydipsia, polyuria, or new focal neuro s/s.    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 He has never used smokeless tobacco. He reports that he does not drink alcohol and does not use drugs. family history includes Cancer in his brother; Diabetes Mellitus II in his mother; Healthy in his daughter; Heart attack in his father. Allergies  Allergen Reactions   Lipitor [Atorvastatin] Other (See Comments)    Myalgias,  cramps in hand   Current Outpatient Medications on File Prior to Visit  Medication Sig Dispense Refill   albuterol (VENTOLIN HFA) 108 (90 Base) MCG/ACT inhaler Inhale 2 puffs into the lungs every 6 (six) hours as needed for wheezing or shortness of breath. 8 g 0   aspirin EC 81 MG tablet Take 1 tablet (81 mg total) by mouth daily. Swallow whole. 30 tablet 11   Azelastine HCl 137 MCG/SPRAY SOLN      calcium carbonate (OS-CAL) 600 MG TABS Take 600 mg by mouth daily.     cetirizine-pseudoephedrine (ZYRTEC-D) 5-120 MG tablet Take 1 tablet by mouth daily. 90 tablet 2   ciprofloxacin-dexamethasone (CIPRODEX) OTIC suspension Place 4 drops into the left ear 2 (two) times daily. 7.5 mL 0   diphenoxylate-atropine (LOMOTIL) 2.5-0.025 MG tablet Take 1 tablet by mouth 4 (four) times daily as needed for diarrhea or loose stools. 30 tablet 1   ferrous sulfate 325 (65 FE) MG EC tablet Take by mouth.     finasteride (PROSCAR) 5 MG tablet TAKE 1 TABLET DAILY 90 tablet 1   fludrocortisone (FLORINEF) 0.1 MG tablet TAKE 1 TABLET DAILY 90 tablet 1   fluticasone (FLONASE) 50 MCG/ACT nasal spray Place 2 sprays into both nostrils daily. [place]  g 6   folic acid (FOLVITE) 1 MG tablet TAKE 1 TABLET DAILY 90 tablet 3   guaiFENesin (MUCINEX) 600 MG 12 hr tablet Take 2 tablets (1,200 mg total) by mouth 2 (two) times daily as needed. 60 tablet 1   IRON PO Take by mouth.     KLOR-CON M20 20 MEQ tablet Take 20 mEq by mouth daily.      levothyroxine (SYNTHROID) 50 MCG tablet Take 1 tablet (50 mcg total) by mouth daily. 90 tablet 1   loperamide (IMODIUM A-D) 2 MG tablet Take 1 tablet (2 mg total) by mouth 2 (two) times daily as needed for diarrhea or loose stools. 30 tablet 3   metFORMIN (GLUCOPHAGE) 500 MG tablet Take 1 tablet (500 mg total) by mouth 2 (two) times daily with a meal. 180 tablet 3   methocarbamol (ROBAXIN) 500 MG tablet 1 tab by mouth at bedtime as needed 90 tablet 1   Multiple Vitamins-Minerals (CENTRUM SILVER PO)  Take 1 tablet by mouth daily.     Omega-3 Fatty Acids (FISH OIL) 1000 MG CAPS Take 1 capsule by mouth daily.     omeprazole (PRILOSEC) 40 MG capsule TAKE 1 CAPSULE DAILY 90 capsule 1   ondansetron (ZOFRAN-ODT) 4 MG disintegrating tablet Take 1 tablet (4 mg total) by mouth every 8 (eight) hours as needed for nausea or vomiting. 20 tablet 0   promethazine-dextromethorphan (PROMETHAZINE-DM) 6.25-15 MG/5ML syrup Take 5 mLs by mouth 2 (two) times daily as needed. 118 mL 0   repaglinide (PRANDIN) 2 MG tablet Take 1 tablet (2 mg total) by mouth 2 (two) times daily before a meal. 180 tablet 3   rosuvastatin (CRESTOR) 5 MG tablet TAKE 1 TABLET DAILY 90 tablet 3   Tamsulosin HCl (FLOMAX) 0.4 MG CAPS Take 0.4 mg by mouth daily.     Testosterone 30 MG/ACT SOLN Place 2 Pump onto the skin daily.     traMADol (ULTRAM) 50 MG tablet Take 1 tablet (50 mg total) by mouth every 6 (six) hours as needed. 15 tablet 0   triamcinolone (NASACORT) 55 MCG/ACT AERO nasal inhaler Place 2 sprays into the nose daily. 1 each 12   No current facility-administered medications on file prior to visit.        ROS:  All others reviewed and negative.  Objective        PE:  BP 122/64   Pulse 64   Temp 98.8 F (37.1 C) (Oral)   Ht '6\' 3"'$  (1.905 m)   Wt 206 lb (93.4 kg)   SpO2 94%   BMI 25.75 kg/m                 Constitutional: Pt appears in NAD, mild ill               HENT: Head: NCAT.                Right Ear: External ear normal.                 Left Ear: External ear normal. Bilat tm's with mild erythema.  Max sinus areas non tender.  Pharynx with mild erythema, no exudate               Eyes: . Pupils are equal, round, and reactive to light. Conjunctivae and EOM are normal               Nose: without d/c or deformity  Neck: Neck supple. Gross normal ROM               Cardiovascular: Normal rate and regular rhythm.                 Pulmonary/Chest: Effort normal and breath sounds without rales but with mild  bialteral wheezing.                               Neurological: Pt is alert. At baseline orientation, motor grossly intact               Skin: Skin is warm. No rashes, no other new lesions, LE edema - none               Psychiatric: Pt behavior is normal without agitation   Micro: none  Cardiac tracings I have personally interpreted today:  none  Pertinent Radiological findings (summarize): none   Lab Results  Component Value Date   WBC 6.5 08/21/2022   HGB 12.4 (L) 08/21/2022   HCT 36.7 (L) 08/21/2022   PLT 197.0 08/21/2022   GLUCOSE 118 (H) 08/21/2022   CHOL 162 08/21/2022   TRIG 163.0 (H) 08/21/2022   HDL 45.30 08/21/2022   LDLCALC 84 08/21/2022   ALT 16 08/21/2022   AST 18 08/21/2022   NA 138 08/21/2022   K 3.9 08/21/2022   CL 102 08/21/2022   CREATININE 1.19 08/21/2022   BUN 19 08/21/2022   CO2 31 08/21/2022   TSH 3.13 08/21/2022   PSA 3.04 07/07/2014   HGBA1C 6.5 08/21/2022   MICROALBUR <0.7 08/21/2022   POCT - Covid neg, Flu neg, RSV - neg  Assessment/Plan:  Joseph Rocha is a 88 y.o. Black or African American [2] male with  has a past medical history of Allergy, Anemia, Diabetes mellitus, Elevated PSA, Hyperlipidemia, Hypertension, Hypogonadism male, OA (osteoarthritis), and Pituitary abnormality ([place]) (2004).  Cough Mild to mod, c/w bronchitis vs pna, declines cxr for now,  for antibx course levaquin 500 qd course, cough med prn, ,  to f/u any worsening symptoms or concerns  Diabetes ([place]) Lab Results  Component Value Date   HGBA1C 6.5 08/21/2022   Stable, pt to continue current medical treatment metfomrin 500 bid, prandin 2 mg bid   Vitamin D deficiency Last vitamin D Lab Results  Component Value Date   VD25OH 60.15 08/21/2022   Stable, cont oral replacement  Wheezing Mild to mod, for depomedrol IM 80 mg , prednisone taper,   to f/u any worsening symptoms or concerns  Followup: Return if symptoms worsen or fail to improve.  Cathlean Cower, MD  01/17/2023 8:06 PM [facility]

## 2023-01-15 NOTE — Patient Instructions (Signed)
Please take all new medication as prescribed - the antibiotic, cough med prn, and prednisone  You had the steroid shot today  Please continue all other medications as before,   Please have the pharmacy call with any other refills you may need.  Please keep your appointments with your specialists as you may have planned

## 2023-01-17 ENCOUNTER — Encounter: Payer: Self-pay | Admitting: Internal Medicine

## 2023-01-17 NOTE — Assessment & Plan Note (Addendum)
Mild to mod, c/w bronchitis vs pna, declines cxr for now,  for antibx course levaquin 500 qd course, cough med prn, ,  to f/u any worsening symptoms or concerns

## 2023-01-17 NOTE — Assessment & Plan Note (Signed)
Last vitamin D Lab Results  Component Value Date   VD25OH 60.15 08/21/2022   Stable, cont oral replacement

## 2023-01-17 NOTE — Assessment & Plan Note (Signed)
Lab Results  Component Value Date   HGBA1C 6.5 08/21/2022   Stable, pt to continue current medical treatment metfomrin 500 bid, prandin 2 mg bid

## 2023-01-17 NOTE — Assessment & Plan Note (Signed)
Mild to mod, for depomedrol IM 80 mg , prednisone taper,   to f/u any worsening symptoms or concerns

## 2023-02-05 ENCOUNTER — Other Ambulatory Visit: Payer: Self-pay | Admitting: Internal Medicine

## 2023-02-21 ENCOUNTER — Ambulatory Visit (INDEPENDENT_AMBULATORY_CARE_PROVIDER_SITE_OTHER): Payer: Medicare Other | Admitting: Internal Medicine

## 2023-02-21 ENCOUNTER — Encounter: Payer: Self-pay | Admitting: Internal Medicine

## 2023-02-21 VITALS — BP 120/80 | HR 69 | Temp 98.2°F | Ht 75.0 in | Wt 208.0 lb

## 2023-02-21 DIAGNOSIS — N1831 Chronic kidney disease, stage 3a: Secondary | ICD-10-CM

## 2023-02-21 DIAGNOSIS — M545 Low back pain, unspecified: Secondary | ICD-10-CM | POA: Diagnosis not present

## 2023-02-21 DIAGNOSIS — E7800 Pure hypercholesterolemia, unspecified: Secondary | ICD-10-CM

## 2023-02-21 DIAGNOSIS — E559 Vitamin D deficiency, unspecified: Secondary | ICD-10-CM

## 2023-02-21 DIAGNOSIS — E1165 Type 2 diabetes mellitus with hyperglycemia: Secondary | ICD-10-CM | POA: Diagnosis not present

## 2023-02-21 DIAGNOSIS — E538 Deficiency of other specified B group vitamins: Secondary | ICD-10-CM | POA: Diagnosis not present

## 2023-02-21 DIAGNOSIS — E039 Hypothyroidism, unspecified: Secondary | ICD-10-CM

## 2023-02-21 DIAGNOSIS — G8929 Other chronic pain: Secondary | ICD-10-CM

## 2023-02-21 LAB — BASIC METABOLIC PANEL
BUN: 22 mg/dL (ref 6–23)
CO2: 30 mEq/L (ref 19–32)
Calcium: 9.2 mg/dL (ref 8.4–10.5)
Chloride: 100 mEq/L (ref 96–112)
Creatinine, Ser: 1.18 mg/dL (ref 0.40–1.50)
GFR: 56.05 mL/min — ABNORMAL LOW (ref >60.00)
Glucose, Bld: 132 mg/dL — ABNORMAL HIGH (ref 70–99)
Potassium: 3.6 mEq/L (ref 3.5–5.1)
Sodium: 137 mEq/L (ref 135–145)

## 2023-02-21 LAB — LIPID PANEL
Cholesterol: 176 mg/dL (ref 0–200)
HDL: 54.7 mg/dL (ref >39.00)
LDL Cholesterol: 99 mg/dL (ref 0–99)
NonHDL: 121.25
Total CHOL/HDL Ratio: 3
Triglycerides: 109 mg/dL (ref 0.0–149.0)
VLDL: 21.8 mg/dL (ref 0.0–40.0)

## 2023-02-21 LAB — CBC WITH DIFFERENTIAL/PLATELET
Basophils Absolute: 0.1 10*3/uL (ref 0.0–0.1)
Basophils Relative: 0.6 % (ref 0.0–3.0)
Eosinophils Absolute: 0.3 10*3/uL (ref 0.0–0.7)
Eosinophils Relative: 2.8 % (ref 0.0–5.0)
HCT: 38.9 % — ABNORMAL LOW (ref 39.0–52.0)
Hemoglobin: 13 g/dL (ref 13.0–17.0)
Lymphocytes Relative: 28.3 % (ref 12.0–46.0)
Lymphs Abs: 2.5 10*3/uL (ref 0.7–4.0)
MCHC: 33.4 g/dL (ref 30.0–36.0)
MCV: 94.6 fl (ref 78.0–100.0)
Monocytes Absolute: 0.8 10*3/uL (ref 0.1–1.0)
Monocytes Relative: 9.1 % (ref 3.0–12.0)
Neutro Abs: 5.3 10*3/uL (ref 1.4–7.7)
Neutrophils Relative %: 59.2 % (ref 43.0–77.0)
Platelets: 200 10*3/uL (ref 150.0–400.0)
RBC: 4.12 Mil/uL — ABNORMAL LOW (ref 4.22–5.81)
RDW: 13.5 % (ref 11.5–15.5)
WBC: 8.9 10*3/uL (ref 4.0–10.5)

## 2023-02-21 LAB — URINALYSIS, ROUTINE W REFLEX MICROSCOPIC
Bilirubin Urine: NEGATIVE
Hgb urine dipstick: NEGATIVE
Ketones, ur: NEGATIVE
Leukocytes,Ua: NEGATIVE
Nitrite: NEGATIVE
RBC / HPF: NONE SEEN (ref 0–?)
Specific Gravity, Urine: 1.02 (ref 1.000–1.030)
Total Protein, Urine: NEGATIVE
Urine Glucose: NEGATIVE
Urobilinogen, UA: 0.2 (ref 0.0–1.0)
WBC, UA: NONE SEEN (ref 0–?)
pH: 6 (ref 5.0–8.0)

## 2023-02-21 LAB — HEPATIC FUNCTION PANEL
ALT: 17 U/L (ref 0–53)
AST: 19 U/L (ref 0–37)
Albumin: 4.1 g/dL (ref 3.5–5.2)
Alkaline Phosphatase: 48 U/L (ref 39–117)
Bilirubin, Direct: 0.1 mg/dL (ref 0.0–0.3)
Total Bilirubin: 0.5 mg/dL (ref 0.2–1.2)
Total Protein: 6.7 g/dL (ref 6.0–8.3)

## 2023-02-21 LAB — TSH: TSH: 2.35 u[IU]/mL (ref 0.35–5.50)

## 2023-02-21 LAB — VITAMIN B12: Vitamin B-12: 353 pg/mL (ref 211–911)

## 2023-02-21 LAB — MICROALBUMIN / CREATININE URINE RATIO
Creatinine,U: 132.2 mg/dL
Microalb Creat Ratio: 0.5 mg/g (ref 0.0–30.0)
Microalb, Ur: <0.7 mg/dL (ref 0.0–1.9)

## 2023-02-21 LAB — HEMOGLOBIN A1C: Hgb A1c MFr Bld: 6.8 % — ABNORMAL HIGH (ref 4.6–6.5)

## 2023-02-21 LAB — VITAMIN D 25 HYDROXY (VIT D DEFICIENCY, FRACTURES): VITD: 69.02 ng/mL (ref 30.00–100.00)

## 2023-02-21 NOTE — Progress Notes (Signed)
Patient ID: Joseph Rocha, male   DOB: 1935-03-03, 88 y.o.   MRN: [id number]        Chief Complaint: follow up HTN, HLD and hyperglycemia , giat disorder, low vit d, ckd3a       HPI:  Joseph Rocha is a 88 y.o. male here with c/o increased gait difficutly with mild off balance and weakness, but does not want cane or PT eval.  Does ask for handicapped parking application signed.  Pt denies chest pain, increased sob or doe, wheezing, orthopnea, PND, increased LE swelling, palpitations, dizziness or syncope.   Pt denies polydipsia, polyuria, or new focal neuro s/s.    Pt denies fever, wt loss, night sweats, loss of appetite, or other constitutional symptoms  due for shingrix       Wt Readings from Last 3 Encounters:  02/21/23 208 lb (94.3 kg)  01/15/23 206 lb (93.4 kg)  10/03/22 206 lb (93.4 kg)   BP Readings from Last 3 Encounters:  02/21/23 120/80  01/15/23 122/64  10/03/22 122/68         Past Medical History:  Diagnosis Date   Allergy    Anemia    Diabetes mellitus    Elevated PSA    Hyperlipidemia    Hypertension    Hypogonadism male    OA (osteoarthritis)    Pituitary abnormality 2004   Past Surgical History:  Procedure Laterality Date   DOPPLER ECHOCARDIOGRAPHY  12/04/2001   ELECTROCARDIOGRAM  03/25/2006   EP IMPLANTABLE DEVICE N/A 03/16/2016   Procedure: Loop Recorder Insertion;  Surgeon: Duke Salvia, MD;  Location: [facility];  Service: Cardiovascular;  Laterality: N/A;   KNEE ARTHROSCOPY Left    LUMBAR DISC SURGERY     MENISCUS REPAIR Right may 2015   TONSILLECTOMY     TRANSPHENOIDAL / TRANSNASAL HYPOPHYSECTOMY / RESECTION PITUITARY TUMOR  2005    reports that he has never smoked. He has never used smokeless tobacco. He reports that he does not drink alcohol and does not use drugs. family history includes Cancer in his brother; Diabetes Mellitus II in his mother; Healthy in his daughter; Heart attack in his father. Allergies  Allergen Reactions   Lipitor  [Atorvastatin] Other (See Comments)    Myalgias, cramps in hand   Current Outpatient Medications on File Prior to Visit  Medication Sig Dispense Refill   albuterol (VENTOLIN HFA) 108 (90 Base) MCG/ACT inhaler Inhale 2 puffs into the lungs every 6 (six) hours as needed for wheezing or shortness of breath. 8 g 0   aspirin EC 81 MG tablet Take 1 tablet (81 mg total) by mouth daily. Swallow whole. 30 tablet 11   Azelastine HCl 137 MCG/SPRAY SOLN      calcium carbonate (OS-CAL) 600 MG TABS Take 600 mg by mouth daily.     cetirizine-pseudoephedrine (ZYRTEC-D) 5-120 MG tablet Take 1 tablet by mouth daily. 90 tablet 2   ciprofloxacin-dexamethasone (CIPRODEX) OTIC suspension Place 4 drops into the left ear 2 (two) times daily. 7.5 mL 0   diphenoxylate-atropine (LOMOTIL) 2.5-0.025 MG tablet Take 1 tablet by mouth 4 (four) times daily as needed for diarrhea or loose stools. 30 tablet 1   ferrous sulfate 325 (65 FE) MG EC tablet Take by mouth.     finasteride (PROSCAR) 5 MG tablet TAKE 1 TABLET DAILY 90 tablet 1   fludrocortisone (FLORINEF) 0.1 MG tablet TAKE 1 TABLET DAILY 90 tablet 1   fluticasone (FLONASE) 50 MCG/ACT nasal spray Place 2  sprays into both nostrils daily. 16 g 6   folic acid (FOLVITE) 1 MG tablet TAKE 1 TABLET DAILY 90 tablet 3   guaiFENesin (MUCINEX) 600 MG 12 hr tablet Take 2 tablets (1,200 mg total) by mouth 2 (two) times daily as needed. 60 tablet 1   IRON PO Take by mouth.     KLOR-CON M20 20 MEQ tablet Take 20 mEq by mouth daily.      levothyroxine (SYNTHROID) 50 MCG tablet Take 1 tablet (50 mcg total) by mouth daily. 90 tablet 1   loperamide (IMODIUM A-D) 2 MG tablet Take 1 tablet (2 mg total) by mouth 2 (two) times daily as needed for diarrhea or loose stools. 30 tablet 3   metFORMIN (GLUCOPHAGE) 500 MG tablet Take 1 tablet (500 mg total) by mouth 2 (two) times daily with a meal. 180 tablet 3   methocarbamol (ROBAXIN) 500 MG tablet 1 tab by mouth at bedtime as needed 90 tablet 1    Multiple Vitamins-Minerals (CENTRUM SILVER PO) Take 1 tablet by mouth daily.     Omega-3 Fatty Acids (FISH OIL) 1000 MG CAPS Take 1 capsule by mouth daily.     omeprazole (PRILOSEC) 40 MG capsule TAKE 1 CAPSULE DAILY 90 capsule 1   ondansetron (ZOFRAN-ODT) 4 MG disintegrating tablet Take 1 tablet (4 mg total) by mouth every 8 (eight) hours as needed for nausea or vomiting. 20 tablet 0   predniSONE (DELTASONE) 10 MG tablet 3 tabs by mouth per day for 3 days,2tabs per day for 3 days,1tab per day for 3 days 18 tablet 0   promethazine-dextromethorphan (PROMETHAZINE-DM) 6.25-15 MG/5ML syrup Take 5 mLs by mouth 2 (two) times daily as needed. 118 mL 0   repaglinide (PRANDIN) 2 MG tablet Take 1 tablet (2 mg total) by mouth 2 (two) times daily before a meal. 180 tablet 3   rosuvastatin (CRESTOR) 5 MG tablet TAKE 1 TABLET DAILY 90 tablet 3   Tamsulosin HCl (FLOMAX) 0.4 MG CAPS Take 0.4 mg by mouth daily.     Testosterone 30 MG/ACT SOLN Place 2 Pump onto the skin daily.     traMADol (ULTRAM) 50 MG tablet Take 1 tablet (50 mg total) by mouth every 6 (six) hours as needed. 15 tablet 0   triamcinolone (NASACORT) 55 MCG/ACT AERO nasal inhaler Place 2 sprays into the nose daily. 1 each 12   No current facility-administered medications on file prior to visit.        ROS:  All others reviewed and negative.  Objective        PE:  BP 120/80 (BP Location: Left Arm, Patient Position: Sitting, Cuff Size: Normal)   Pulse 69   Temp 98.2 F (36.8 C) (Oral)   Ht  (1.905 m)   Wt 208 lb (94.3 kg)   SpO2 93%   BMI 26.00 kg/m                 Constitutional: Pt appears in NAD               HENT: Head: NCAT.                Right Ear: External ear normal.                 Left Ear: External ear normal.                Eyes: . Pupils are equal, round, and reactive to light. Conjunctivae and EOM are normal  Nose: without d/c or deformity               Neck: Neck supple. Gross normal ROM                Cardiovascular: Normal rate and regular rhythm.                 Pulmonary/Chest: Effort normal and breath sounds without rales or wheezing.                Abd:  Soft, NT, ND, + BS, no organomegaly               Neurological: Pt is alert. At baseline orientation, motor grossly intact               Skin: Skin is warm. No rashes, no other new lesions, LE edema - none               Psychiatric: Pt behavior is normal without agitation   Micro: none  Cardiac tracings I have personally interpreted today:  none  Pertinent Radiological findings (summarize): none   Lab Results  Component Value Date   WBC 8.9 02/21/2023   HGB 13.0 02/21/2023   HCT 38.9 (L) 02/21/2023   PLT 200.0 02/21/2023   GLUCOSE 132 (H) 02/21/2023   CHOL 176 02/21/2023   TRIG 109.0 02/21/2023   HDL 54.70 02/21/2023   LDLCALC 99 02/21/2023   ALT 17 02/21/2023   AST 19 02/21/2023   NA 137 02/21/2023   K 3.6 02/21/2023   CL 100 02/21/2023   CREATININE 1.18 02/21/2023   BUN 22 02/21/2023   CO2 30 02/21/2023   TSH 2.35 02/21/2023   PSA 3.04 07/07/2014   HGBA1C 6.8 (H) 02/21/2023   MICROALBUR <0.7 02/21/2023   Assessment/Plan:  FABRIZZIO MARCELLA is a 88 y.o. Black or African American [2] male with  has a past medical history of Allergy, Anemia, Diabetes mellitus, Elevated PSA, Hyperlipidemia, Hypertension, Hypogonadism male, OA (osteoarthritis), and Pituitary abnormality (2004).  Diabetes ([facility]) Lab Results  Component Value Date   HGBA1C 6.8 (H) 02/21/2023   Stable, pt to continue current medical treatment prandin 2 mg bid, metformin 500 mg - 2 qd    HYPERCHOLESTEROLEMIA Lab Results  Component Value Date   LDLCALC 99 02/21/2023   Uncontrolled, goal ldl < 70, pt to continue current statin crestor 5 mg, declines increased dose due to hx of lipitor intolerance   Vitamin D deficiency Last vitamin D Lab Results  Component Value Date   VD25OH 69.02 02/21/2023   Stable, cont oral replacement   CKD  (chronic kidney disease), stage III ([facility]) Lab Results  Component Value Date   CREATININE 1.18 02/21/2023   Stable overall, cont to avoid nephrotoxins   Low back pain Chronic mild recurrent with increased off balance recent but delcines cane, PT - for handicap parking applic signed today  Hypothyroidism Lab Results  Component Value Date   TSH 2.35 02/21/2023   Stable, pt to continue levothyroxine 50 mcg qd  Followup: Return in about 6 months (around 08/23/2023).  Oliver Barre, MD 02/23/2023 6:17 PM [facility] [address] [facility]

## 2023-02-21 NOTE — Patient Instructions (Addendum)
Please have your Shingrix (shingles) shots done at your local pharmacy.  You are given the signed handicap parking application form today  Please continue all other medications as before, and refills have been done if requested.  Please have the pharmacy call with any other refills you may need.  Please continue your efforts at being more active, low cholesterol diet, and weight control.  You are otherwise up to date with prevention measures today.  Please keep your appointments with your specialists as you may have planned  Please go to the LAB at the blood drawing area for the tests to be done  You will be contacted by phone if any changes need to be made immediately.  Otherwise, you will receive a letter about your results with an explanation, but please check with MyChart first.  Please remember to sign up for MyChart if you have not done so, as this will be important to you in the future with finding out test results, communicating by private email, and scheduling acute appointments online when needed.  Please make an Appointment to return in 6 months, or sooner if needed

## 2023-02-23 ENCOUNTER — Encounter: Payer: Self-pay | Admitting: Internal Medicine

## 2023-02-23 NOTE — Assessment & Plan Note (Signed)
Lab Results  Component Value Date   LDLCALC 99 02/21/2023   Uncontrolled, goal ldl < 70, pt to continue current statin crestor 5 mg, declines increased dose due to hx of lipitor intolerance

## 2023-02-23 NOTE — Assessment & Plan Note (Signed)
Last vitamin D Lab Results  Component Value Date   VD25OH 69.02 02/21/2023   Stable, cont oral replacement

## 2023-02-23 NOTE — Assessment & Plan Note (Signed)
Chronic mild recurrent with increased off balance recent but delcines cane, PT - for handicap parking applic signed today

## 2023-02-23 NOTE — Assessment & Plan Note (Signed)
Lab Results  Component Value Date   TSH 2.35 02/21/2023   Stable, pt to continue levothyroxine 50 mcg qd

## 2023-02-23 NOTE — Assessment & Plan Note (Signed)
Lab Results  Component Value Date   CREATININE 1.18 02/21/2023   Stable overall, cont to avoid nephrotoxins

## 2023-02-23 NOTE — Assessment & Plan Note (Signed)
Lab Results  Component Value Date   HGBA1C 6.8 (H) 02/21/2023   Stable, pt to continue current medical treatment prandin 2 mg bid, metformin 500 mg - 2 qd

## 2023-03-07 ENCOUNTER — Other Ambulatory Visit: Payer: Self-pay | Admitting: Internal Medicine

## 2023-03-14 ENCOUNTER — Ambulatory Visit: Payer: Medicare Other | Admitting: Internal Medicine

## 2023-03-14 NOTE — Progress Notes (Deleted)
Patient ID: Joseph Rocha, male   DOB: Nov 10, 1934, 88 y.o.   MRN: [id number]  HPI: Joseph Rocha is a 88 y.o.-year-old male, returning for follow-up for DM2, dx in 1988, non-insulin-dependent (on insulin briefly after diagnosis), uncontrolled, with  complications (CKD stage 3a, PN). Pt. previously saw Dr. Everardo All, last visit 6 months ago.  Interim history: No increased urination, blurry vision, nausea, chest pain.   Reviewed HbA1c: Lab Results  Component Value Date   HGBA1C 6.8 (H) 02/21/2023   HGBA1C 6.5 08/21/2022   HGBA1C 6.5 03/20/2022   HGBA1C 8.5 (A) 09/21/2021   HGBA1C 6.7 (H) 01/16/2021   HGBA1C 6.5 (H) 07/13/2020   HGBA1C 6.6 (A) 05/23/2020   HGBA1C 9.5 (A) 02/09/2020   HGBA1C 7.5 (A) 09/08/2019   HGBA1C 6.7 (A) 05/18/2019   Pt is on a regimen of: - Metformin ER 500 mg 2x a day, with meals - Repaglinide 2 mg 2x a day  Pt checks his sugars ~1x a week and they are: - am: 98, 115-140, 150 - 2h after b'fast: n/c - before lunch: n/c - 2h after lunch: n/c - before dinner: n/c - 2h after dinner: n/c - bedtime: n/c - nighttime: n/c Lowest sugar was 98; ? hypoglycemia awareness. Highest sugar was 150.  Glucometer:One Touch  - + CKD, last BUN/creatinine:  Lab Results  Component Value Date   BUN 22 02/21/2023   BUN 19 08/21/2022   CREATININE 1.18 02/21/2023   CREATININE 1.19 08/21/2022   -+ HL; last set of lipids: Lab Results  Component Value Date   CHOL 176 02/21/2023   HDL 54.70 02/21/2023   LDLCALC 99 02/21/2023   TRIG 109.0 02/21/2023   CHOLHDL 3 02/21/2023  On Crestor 5 mg daily omega-3 fatty acids 1000 mg daily.  Lipitor caused muscle cramps and pain.  - last eye exam was 12/31/2022 - No DR.   - + numbness and tingling in his feet.  Last foot exam 03/20/2022.  He also has a history of hypogonadism-on testosterone, HTN, OA, anemia, GERD.  He has hypothyroidism-on levothyroxine 50 mcg daily.   - in am - fasting- at least 30 min from b'fast - no  calcium - + iron - in am >> moved >4h later - no multivitamins - + PPIs - Omeprazole in am >> moved >4h later - not on Biotin  TFTs reviewed and are controlled: Lab Results  Component Value Date   TSH 2.35 02/21/2023   TSH 3.13 08/21/2022   TSH 2.47 03/20/2022   TSH 2.65 01/16/2021   TSH 3.34 07/13/2020   TSH 0.83 02/09/2020   TSH 1.80 07/16/2017   TSH 1.65 08/15/2016   TSH 2.00 07/26/2016   TSH 4.65 (H) 04/06/2016   ROS: + see HPI  Past Medical History:  Diagnosis Date   Allergy    Anemia    Diabetes mellitus    Elevated PSA    Hyperlipidemia    Hypertension    Hypogonadism male    OA (osteoarthritis)    Pituitary abnormality (HCC) 2004   Past Surgical History:  Procedure Laterality Date   DOPPLER ECHOCARDIOGRAPHY  12/04/2001   ELECTROCARDIOGRAM  03/25/2006   EP IMPLANTABLE DEVICE N/A 03/16/2016   Procedure: Loop Recorder Insertion;  Surgeon: Duke Salvia, MD;  Location: [facility];  Service: Cardiovascular;  Laterality: N/A;   KNEE ARTHROSCOPY Left    LUMBAR DISC SURGERY     MENISCUS REPAIR Right may 2015   TONSILLECTOMY     TRANSPHENOIDAL /  TRANSNASAL HYPOPHYSECTOMY / RESECTION PITUITARY TUMOR  2005   Social History   Socioeconomic History   Marital status: Married    Spouse name: Not on file   Number of children: 2   Years of education: Not on file   Highest education level: Not on file  Occupational History   Occupation: retired    Associate Professor: RETIRED  Tobacco Use   Smoking status: Never   Smokeless tobacco: Never  Vaping Use   Vaping Use: Never used  Substance and Sexual Activity   Alcohol use: No   Drug use: No   Sexual activity: Not Currently  Other Topics Concern   Not on file  Social History Narrative   Lives with wife.  They have two grown children.   He is retired from the [employer].   Social Determinants of Health   Financial Resource Strain: Low Risk  (06/08/2022)   Overall Financial Resource Strain (CARDIA)     Difficulty of Paying Living Expenses: Not hard at all  Food Insecurity: No Food Insecurity (06/08/2022)   Hunger Vital Sign    Worried About Running Out of Food in the Last Year: Never true    Ran Out of Food in the Last Year: Never true  Transportation Needs: No Transportation Needs (06/08/2022)   PRAPARE - Administrator, Civil Service (Medical): No    Lack of Transportation (Non-Medical): No  Physical Activity: Inactive (06/08/2022)   Exercise Vital Sign    Days of Exercise per Week: 0 days    Minutes of Exercise per Session: 0 min  Stress: No Stress Concern Present (06/08/2022)   [employer] of Occupational Health - Occupational Stress Questionnaire    Feeling of Stress : Not at all  Social Connections: Socially Integrated (06/08/2022)   Social Connection and Isolation Panel [NHANES]    Frequency of Communication with Friends and Family: Three times a week    Frequency of Social Gatherings with Friends and Family: Three times a week    Attends Religious Services: More than 4 times per year    Active Member of Clubs or Organizations: Yes    Attends Banker Meetings: More than 4 times per year    Marital Status: Married  Catering manager Violence: Not At Risk (06/08/2022)   Humiliation, Afraid, Rape, and Kick questionnaire    Fear of Current or Ex-Partner: No    Emotionally Abused: No    Physically Abused: No    Sexually Abused: No   Current Outpatient Medications on File Prior to Visit  Medication Sig Dispense Refill   albuterol (VENTOLIN HFA) 108 (90 Base) MCG/ACT inhaler Inhale 2 puffs into the lungs every 6 (six) hours as needed for wheezing or shortness of breath. 8 g 0   aspirin EC 81 MG tablet Take 1 tablet (81 mg total) by mouth daily. Swallow whole. 30 tablet 11   Azelastine HCl 137 MCG/SPRAY SOLN      calcium carbonate (OS-CAL) 600 MG TABS Take 600 mg by mouth daily.     cetirizine-pseudoephedrine (ZYRTEC-D) 5-120 MG tablet Take 1 tablet by mouth  daily. 90 tablet 2   ciprofloxacin-dexamethasone (CIPRODEX) OTIC suspension Place 4 drops into the left ear 2 (two) times daily. 7.5 mL 0   diphenoxylate-atropine (LOMOTIL) 2.5-0.025 MG tablet Take 1 tablet by mouth 4 (four) times daily as needed for diarrhea or loose stools. 30 tablet 1   ferrous sulfate 325 (65 FE) MG EC tablet Take by mouth.  finasteride (PROSCAR) 5 MG tablet TAKE 1 TABLET DAILY 90 tablet 1   fludrocortisone (FLORINEF) 0.1 MG tablet TAKE 1 TABLET DAILY 90 tablet 1   fluticasone (FLONASE) 50 MCG/ACT nasal spray Place 2 sprays into both nostrils daily. 16 g 6   folic acid (FOLVITE) 1 MG tablet TAKE 1 TABLET DAILY 90 tablet 3   guaiFENesin (MUCINEX) 600 MG 12 hr tablet Take 2 tablets (1,200 mg total) by mouth 2 (two) times daily as needed. 60 tablet 1   IRON PO Take by mouth.     KLOR-CON M20 20 MEQ tablet Take 20 mEq by mouth daily.      levothyroxine (SYNTHROID) 50 MCG tablet Take 1 tablet (50 mcg total) by mouth daily. 90 tablet 1   loperamide (IMODIUM A-D) 2 MG tablet Take 1 tablet (2 mg total) by mouth 2 (two) times daily as needed for diarrhea or loose stools. 30 tablet 3   metFORMIN (GLUCOPHAGE) 500 MG tablet Take 1 tablet (500 mg total) by mouth 2 (two) times daily with a meal. 180 tablet 3   methocarbamol (ROBAXIN) 500 MG tablet 1 tab by mouth at bedtime as needed 90 tablet 1   Multiple Vitamins-Minerals (CENTRUM SILVER PO) Take 1 tablet by mouth daily.     Omega-3 Fatty Acids (FISH OIL) 1000 MG CAPS Take 1 capsule by mouth daily.     omeprazole (PRILOSEC) 40 MG capsule TAKE 1 CAPSULE DAILY 90 capsule 1   ondansetron (ZOFRAN-ODT) 4 MG disintegrating tablet Take 1 tablet (4 mg total) by mouth every 8 (eight) hours as needed for nausea or vomiting. 20 tablet 0   predniSONE (DELTASONE) 10 MG tablet 3 tabs by mouth per day for 3 days,2tabs per day for 3 days,1tab per day for 3 days 18 tablet 0   promethazine-dextromethorphan (PROMETHAZINE-DM) 6.25-15 MG/5ML syrup Take 5  mLs by mouth 2 (two) times daily as needed. 118 mL 0   repaglinide (PRANDIN) 2 MG tablet Take 1 tablet (2 mg total) by mouth 2 (two) times daily before a meal. 180 tablet 3   rosuvastatin (CRESTOR) 5 MG tablet TAKE 1 TABLET DAILY 90 tablet 3   Tamsulosin HCl (FLOMAX) 0.4 MG CAPS Take 0.4 mg by mouth daily.     Testosterone 30 MG/ACT SOLN Place 2 Pump onto the skin daily.     traMADol (ULTRAM) 50 MG tablet Take 1 tablet (50 mg total) by mouth every 6 (six) hours as needed. 15 tablet 0   triamcinolone (NASACORT) 55 MCG/ACT AERO nasal inhaler Place 2 sprays into the nose daily. 1 each 12   No current facility-administered medications on file prior to visit.   Allergies  Allergen Reactions   Lipitor [Atorvastatin] Other (See Comments)    Myalgias, cramps in hand   Family History  Problem Relation Age of Onset   Diabetes Mellitus II Mother        Deceased, 61s   Heart attack Father    Cancer Brother        uncertain type   Healthy Daughter    Colon cancer Neg Hx    Esophageal cancer Neg Hx    Liver cancer Neg Hx    Pancreatic cancer Neg Hx    Stomach cancer Neg Hx    PE: There were no vitals taken for this visit. Wt Readings from Last 3 Encounters:  02/21/23 208 lb (94.3 kg)  01/15/23 206 lb (93.4 kg)  10/03/22 206 lb (93.4 kg)   Constitutional: Slightly overweight, in NAD Eyes:  EOMI, no exophthalmos ENT: no neck masses, no cervical lymphadenopathy Cardiovascular: RRR, No MRG Respiratory: CTA B Musculoskeletal: no deformities Skin:no rashes Neurological: no tremor with outstretched hands  ASSESSMENT: 1. DM2, non-insulin-dependent, controlled, with complications - CKD stage 3a - PN  2. HL  3. Hypothyroidism  PLAN:  1. Patient with longstanding, fairly well-controlled diabetes, on oral antidiabetic regimen.  HbA1c was at goal at last visit, 6.5%, and he had another HbA1c obtained last month and this was slightly higher, but still at goal, at 6.8%. -At last visit, he  was only checking sugars in the morning and only approximately once a week.  I advised him to check it every day or at least every other day.  Sugars were at or slightly above target, which is acceptable, due to age.  At that time, we discussed about the possibility of changing his repaglinide to an SGLT2 inhibitor due to his history of CKD but he declined because he could not afford brand-name medications.  We did discuss about the patient assistance program but he preferred to continue the same regimen.  - I suggested to:  Patient Instructions  Please continue: - Metformin ER 500 mg 2x a day, with meals - Repaglinide 2 mg 2x a day  Please continue Levothyroxine 50 mcg daily.  Take the thyroid hormone every day, with water, at least 30 minutes before breakfast, separated by at least 4 hours from: - acid reflux medications - calcium - iron - multivitamins  Please return for another visit in 6 months.  - we checked his HbA1c: 7%  - advised to check sugars at different times of the day - 1x a day, rotating check times - advised for yearly eye exams >> he is UTD - return to clinic in 6 months  2. HL -Reviewed latest lipid panel from last month: LDL above the target of meglitinide, otherwise fractions at goal: Lab Results  Component Value Date   CHOL 176 02/21/2023   HDL 54.70 02/21/2023   LDLCALC 99 02/21/2023   TRIG 109.0 02/21/2023   CHOLHDL 3 02/21/2023  -He continues on omega-3 fatty acids and Crestor 5 mg daily without side effects.  Lipitor caused muscle cramps and pain in the past.  3. Hypothyroidism - latest thyroid labs reviewed with pt. >> normal: Lab Results  Component Value Date   TSH 2.35 02/21/2023  - he continues on LT4 50 mcg daily - pt feels good on this dose. - we discussed about taking the thyroid hormone every day, with water, >30 minutes before breakfast, separated by >4 hours from acid reflux medications, calcium, iron, multivitamins. Pt. is taking it  correctly now -last visit he was taking iron and omeprazole in the morning and I advised him to move this around lunchtime.  I also advised him to always take levothyroxine before meals as he was occasionally taking it after breakfast. - will check thyroid tests today: TSH and fT4 - If labs are abnormal, he will need to return for repeat TFTs in 1.5 months  Carlus Pavlov, MD PhD [facility]

## 2023-03-25 ENCOUNTER — Emergency Department (HOSPITAL_COMMUNITY): Payer: Medicare Other

## 2023-03-25 ENCOUNTER — Emergency Department (HOSPITAL_COMMUNITY)
Admission: EM | Admit: 2023-03-25 | Discharge: 2023-03-25 | Disposition: A | Discharge status: Home / Self Care | Payer: Medicare Other | Attending: Emergency Medicine | Admitting: Emergency Medicine

## 2023-03-25 ENCOUNTER — Other Ambulatory Visit: Payer: Self-pay

## 2023-03-25 DIAGNOSIS — Z7984 Long term (current) use of oral hypoglycemic drugs: Secondary | ICD-10-CM | POA: Insufficient documentation

## 2023-03-25 DIAGNOSIS — Z1152 Encounter for screening for COVID-19: Secondary | ICD-10-CM | POA: Insufficient documentation

## 2023-03-25 DIAGNOSIS — I10 Essential (primary) hypertension: Secondary | ICD-10-CM | POA: Insufficient documentation

## 2023-03-25 DIAGNOSIS — Z79899 Other long term (current) drug therapy: Secondary | ICD-10-CM | POA: Diagnosis not present

## 2023-03-25 DIAGNOSIS — E119 Type 2 diabetes mellitus without complications: Secondary | ICD-10-CM | POA: Insufficient documentation

## 2023-03-25 DIAGNOSIS — Z7982 Long term (current) use of aspirin: Secondary | ICD-10-CM | POA: Diagnosis not present

## 2023-03-25 DIAGNOSIS — K529 Noninfective gastroenteritis and colitis, unspecified: Secondary | ICD-10-CM | POA: Insufficient documentation

## 2023-03-25 DIAGNOSIS — E876 Hypokalemia: Secondary | ICD-10-CM | POA: Diagnosis not present

## 2023-03-25 DIAGNOSIS — R112 Nausea with vomiting, unspecified: Secondary | ICD-10-CM | POA: Diagnosis not present

## 2023-03-25 LAB — CBC WITH DIFFERENTIAL/PLATELET
Abs Immature Granulocytes: 0.04 10*3/uL (ref 0.00–0.07)
Basophils Absolute: 0 10*3/uL (ref 0.0–0.1)
Basophils Relative: 0 %
Eosinophils Absolute: 0.2 10*3/uL (ref 0.0–0.5)
Eosinophils Relative: 2 %
HCT: 34.4 % — ABNORMAL LOW (ref 39.0–52.0)
Hemoglobin: 12 g/dL — ABNORMAL LOW (ref 13.0–17.0)
Immature Granulocytes: 0 %
Lymphocytes Relative: 14 %
Lymphs Abs: 1.3 10*3/uL (ref 0.7–4.0)
MCH: 32.2 pg (ref 26.0–34.0)
MCHC: 34.9 g/dL (ref 30.0–36.0)
MCV: 92.2 fL (ref 80.0–100.0)
Monocytes Absolute: 0.9 10*3/uL (ref 0.1–1.0)
Monocytes Relative: 10 %
Neutro Abs: 6.7 10*3/uL (ref 1.7–7.7)
Neutrophils Relative %: 74 %
Platelets: 160 10*3/uL (ref 150–400)
RBC: 3.73 MIL/uL — ABNORMAL LOW (ref 4.22–5.81)
RDW: 13.3 % (ref 11.5–15.5)
WBC: 9.1 10*3/uL (ref 4.0–10.5)
nRBC: 0 % (ref 0.0–0.2)

## 2023-03-25 LAB — COMPREHENSIVE METABOLIC PANEL
ALT: 27 U/L (ref 0–44)
AST: 30 U/L (ref 15–41)
Albumin: 3.9 g/dL (ref 3.5–5.0)
Alkaline Phosphatase: 50 U/L (ref 38–126)
Anion gap: 5 (ref 5–15)
BUN: 20 mg/dL (ref 8–23)
CO2: 25 mmol/L (ref 22–32)
Calcium: 8.2 mg/dL — ABNORMAL LOW (ref 8.9–10.3)
Chloride: 101 mmol/L (ref 98–111)
Creatinine, Ser: 0.96 mg/dL (ref 0.61–1.24)
GFR, Estimated: >60 mL/min (ref >60)
Glucose, Bld: 156 mg/dL — ABNORMAL HIGH (ref 70–99)
Potassium: 3.2 mmol/L — ABNORMAL LOW (ref 3.5–5.1)
Sodium: 131 mmol/L — ABNORMAL LOW (ref 135–145)
Total Bilirubin: 0.9 mg/dL (ref 0.3–1.2)
Total Protein: 6.9 g/dL (ref 6.5–8.1)

## 2023-03-25 LAB — TROPONIN I (HIGH SENSITIVITY)
Troponin I (High Sensitivity): 4 ng/L (ref <18)
Troponin I (High Sensitivity): 4 ng/L (ref <18)

## 2023-03-25 LAB — POTASSIUM: Potassium: 3.4 mmol/L — ABNORMAL LOW (ref 3.5–5.1)

## 2023-03-25 LAB — MAGNESIUM
Magnesium: 1.3 mg/dL — ABNORMAL LOW (ref 1.7–2.4)
Magnesium: 1.7 mg/dL (ref 1.7–2.4)

## 2023-03-25 LAB — SARS CORONAVIRUS 2 BY RT PCR: SARS Coronavirus 2 by RT PCR: NEGATIVE

## 2023-03-25 LAB — LIPASE, BLOOD: Lipase: 27 U/L (ref 11–51)

## 2023-03-25 MED ORDER — ONDANSETRON 4 MG PO TBDP: 4.0000 mg | ORAL_TABLET | Freq: Three times a day (TID) | ORAL | 0 refills | Status: DC | PRN

## 2023-03-25 MED ORDER — MAGNESIUM OXIDE -MG SUPPLEMENT 400 (240 MG) MG PO TABS
400.0000 mg | ORAL_TABLET | Freq: Once | ORAL | Status: AC
  Administered 2023-03-25: 400 mg via ORAL
  Filled 2023-03-25: qty 1

## 2023-03-25 MED ORDER — LOPERAMIDE HCL 2 MG PO TABS: 2.0000 mg | ORAL_TABLET | Freq: Two times a day (BID) | ORAL | 3 refills | Status: AC | PRN

## 2023-03-25 MED ORDER — MAGNESIUM SULFATE 2 GM/50ML IV SOLN
2.0000 g | Freq: Once | INTRAVENOUS | Status: AC
  Administered 2023-03-25: 2 g via INTRAVENOUS
  Filled 2023-03-25: qty 50

## 2023-03-25 MED ORDER — LACTATED RINGERS IV BOLUS
500.0000 mL | Freq: Once | INTRAVENOUS | Status: AC
  Administered 2023-03-25: 500 mL via INTRAVENOUS

## 2023-03-25 MED ORDER — ONDANSETRON HCL 4 MG/2ML IJ SOLN
4.0000 mg | Freq: Once | INTRAMUSCULAR | Status: AC
  Administered 2023-03-25: 4 mg via INTRAVENOUS
  Filled 2023-03-25: qty 2

## 2023-03-25 MED ORDER — POTASSIUM CHLORIDE CRYS ER 20 MEQ PO TBCR
40.0000 meq | EXTENDED_RELEASE_TABLET | Freq: Once | ORAL | Status: AC
  Administered 2023-03-25: 40 meq via ORAL
  Filled 2023-03-25: qty 2

## 2023-03-25 NOTE — ED Provider Notes (Signed)
[place] EMERGENCY DEPARTMENT AT [facility] Provider Note   CSN: [id number] Arrival date & time: 03/25/23  7829     History  Chief Complaint  Patient presents with   Nausea   Emesis    Joseph Rocha is a 88 y.o. male.   Emesis Associated symptoms: diarrhea      88 year old male with medical history significant for DM, HLD, HTN who presents to the emergency department with roughly 12 hours of nausea, vomiting, diarrhea.  The patient denies any significant abdominal pain.  He states that he has had roughly 3 episodes of NBNB emesis over the past night.  He has had about 4 episodes of watery diarrhea.  He denies any coffee-ground emesis or hematemesis.  He denies any melena or hematochezia.  With EMS his CBG was 173 and he was found to be vitally stable.  He feels slightly dehydrated.  He was administered 500 cc of normal saline and route with EMS.  He arrives hemodynamically stable continuing to complain of nausea.  He denies any chest pain or shortness of breath, no fever, chills or cough.  He continues to deny any abdominal pain.  Home Medications Prior to Admission medications   Medication Sig Start Date End Date Taking? Authorizing Provider  ondansetron (ZOFRAN-ODT) 4 MG disintegrating tablet Take 1 tablet (4 mg total) by mouth every 8 (eight) hours as needed for nausea or vomiting. 03/25/23  Yes Ernie Avena, MD  albuterol (VENTOLIN HFA) 108 (90 Base) MCG/ACT inhaler Inhale 2 puffs into the lungs every 6 (six) hours as needed for wheezing or shortness of breath. 10/03/22   [name], MD  aspirin EC 81 MG tablet Take 1 tablet (81 mg total) by mouth daily. Swallow whole. 07/13/20   [name], MD  Azelastine HCl 137 MCG/SPRAY SOLN  07/26/22   [provider]  calcium carbonate (OS-CAL) 600 MG TABS Take 600 mg by mouth daily.    [provider]  cetirizine-pseudoephedrine (ZYRTEC-D) 5-120 MG tablet Take 1 tablet by mouth daily. 05/09/17   Romero Belling, MD  ciprofloxacin-dexamethasone [facility]) OTIC suspension Place 4 drops into the left ear 2 (two) times daily. 07/10/22   Henson, Vickie L, NP-C  diphenoxylate-atropine (LOMOTIL) 2.5-0.025 MG tablet Take 1 tablet by mouth 4 (four) times daily as needed for diarrhea or loose stools. 12/21/21   [name], MD  ferrous sulfate 325 (65 FE) MG EC tablet Take by mouth. 07/26/22   [provider]  finasteride (PROSCAR) 5 MG tablet TAKE 1 TABLET DAILY 04/04/21   [name], MD  fludrocortisone (FLORINEF) 0.1 MG tablet TAKE 1 TABLET DAILY 02/06/23   [name], MD  fluticasone [facility]) 50 MCG/ACT nasal spray Place 2 sprays into both nostrils daily. 10/15/18   Myrlene Broker, MD  folic acid (FOLVITE) 1 MG tablet TAKE 1 TABLET DAILY 04/03/22   [name], MD  guaiFENesin (MUCINEX) 600 MG 12 hr tablet Take 2 tablets (1,200 mg total) by mouth 2 (two) times daily as needed. 10/03/22   [name], MD  IRON PO Take by mouth.    [provider]  KLOR-CON M20 20 MEQ tablet Take 20 mEq by mouth daily.  03/15/13   [provider]  levothyroxine (SYNTHROID) 50 MCG tablet Take 1 tablet (50 mcg total) by mouth daily. 11/09/22   [name], MD  loperamide (IMODIUM A-D) 2 MG tablet Take 1 tablet (2 mg total) by mouth 2 (  two) times daily as needed for diarrhea or loose stools. 03/25/23   Ernie Avena, MD  metFORMIN (GLUCOPHAGE) 500 MG tablet Take 1 tablet (500 mg total) by mouth 2 (two) times daily with a meal. 09/13/22   Carlus Pavlov, MD  methocarbamol (ROBAXIN) 500 MG tablet 1 tab by mouth at bedtime as needed 12/30/20   [name], MD  Multiple Vitamins-Minerals (CENTRUM SILVER PO) Take 1 tablet by mouth daily.    [provider]  Omega-3 Fatty Acids (FISH OIL) 1000 MG CAPS Take 1 capsule by mouth daily.    [provider]  omeprazole (PRILOSEC) 40 MG capsule TAKE 1 CAPSULE DAILY 10/15/22   [name], MD  predniSONE (DELTASONE) 10 MG tablet 3  tabs by mouth per day for 3 days,2tabs per day for 3 days,1tab per day for 3 days 01/15/23   [name], MD  promethazine-dextromethorphan (PROMETHAZINE-DM) 6.25-15 MG/5ML syrup Take 5 mLs by mouth 2 (two) times daily as needed. 09/25/22   Myrlene Broker, MD  repaglinide (PRANDIN) 2 MG tablet Take 1 tablet (2 mg total) by mouth 2 (two) times daily before a meal. 09/11/22   [name], MD  rosuvastatin (CRESTOR) 5 MG tablet TAKE 1 TABLET DAILY 10/15/22   [name], MD  Tamsulosin HCl (FLOMAX) 0.4 MG CAPS Take 0.4 mg by mouth daily.    [provider]  Testosterone 30 MG/ACT SOLN Place 2 Pump onto the skin daily.    [provider]  traMADol (ULTRAM) 50 MG tablet Take 1 tablet (50 mg total) by mouth every 6 (six) hours as needed. 05/07/18   Romero Belling, MD  triamcinolone (NASACORT) 55 MCG/ACT AERO nasal inhaler Place 2 sprays into the nose daily. 07/13/20   [name], MD      Allergies    Lipitor [atorvastatin]    Review of Systems   Review of Systems  Gastrointestinal:  Positive for diarrhea, nausea and vomiting.  All other systems reviewed and are negative.   Physical Exam Updated Vital Signs BP (!) 165/90   Pulse (!) 57   Temp 97.9 F (36.6 C) (Oral)   Resp 19   SpO2 96%  Physical Exam Vitals and nursing note reviewed.  Constitutional:      General: He is not in acute distress.    Appearance: He is well-developed.  HENT:     Head: Normocephalic and atraumatic.     Mouth/Throat:     Mouth: Mucous membranes are dry.  Eyes:     Conjunctiva/sclera: Conjunctivae normal.  Cardiovascular:     Rate and Rhythm: Normal rate and regular rhythm.  Pulmonary:     Effort: Pulmonary effort is normal. No respiratory distress.     Breath sounds: Normal breath sounds.  Abdominal:     General: There is no distension.     Palpations: Abdomen is soft.     Tenderness: There is no abdominal tenderness.  Musculoskeletal:        General: No swelling.      Cervical back: Neck supple.  Skin:    General: Skin is warm and dry.     Capillary Refill: Capillary refill takes less than 2 seconds.  Neurological:     Mental Status: He is alert.  Psychiatric:        Mood and Affect: Mood normal.     ED Results / Procedures / Treatments   Labs (all labs ordered are listed, but only abnormal results are displayed) Labs  Reviewed  CBC WITH DIFFERENTIAL/PLATELET - Abnormal; Notable for the following components:      Result Value   RBC 3.73 (*)    Hemoglobin 12.0 (*)    HCT 34.4 (*)    All other components within normal limits  COMPREHENSIVE METABOLIC PANEL - Abnormal; Notable for the following components:   Sodium 131 (*)    Potassium 3.2 (*)    Glucose, Bld 156 (*)    Calcium 8.2 (*)    All other components within normal limits  MAGNESIUM - Abnormal; Notable for the following components:   Magnesium 1.3 (*)    All other components within normal limits  POTASSIUM - Abnormal; Notable for the following components:   Potassium 3.4 (*)    All other components within normal limits  SARS CORONAVIRUS 2 BY RT PCR  LIPASE, BLOOD  MAGNESIUM  TROPONIN I (HIGH SENSITIVITY)  TROPONIN I (HIGH SENSITIVITY)    EKG EKG Interpretation  Date/Time:  Monday Mar 25 2023 07:59:29 EDT Ventricular Rate:  78 PR Interval:  185 QRS Duration: 117 QT Interval:  449 QTC Calculation: 512 R Axis:   23 Text Interpretation: Sinus rhythm Ventricular premature complex Nonspecific intraventricular conduction delay Confirmed by Ernie Avena ([id number]) on 03/25/2023 8:16:22 AM  Radiology DG Chest Portable 1 View  Result Date: 03/25/2023 CLINICAL DATA:  Nausea/vomiting EXAM: PORTABLE CHEST 1 VIEW COMPARISON:  10/03/2022 FINDINGS: Lungs are clear.  No pleural effusion or pneumothorax. The heart is normal in size. IMPRESSION: No acute cardiopulmonary disease. Electronically Signed   By: Charline Bills M.D.   On: 03/25/2023 08:12    Procedures Procedures     Medications Ordered in ED Medications  ondansetron (ZOFRAN) injection 4 mg (4 mg Intravenous Given 03/25/23 0809)  lactated ringers bolus 500 mL (0 mLs Intravenous Stopped 03/25/23 1007)  potassium chloride SA (KLOR-CON M) CR tablet 40 mEq (40 mEq Oral Given 03/25/23 1005)  magnesium sulfate IVPB 2 g 50 mL (0 g Intravenous Stopped 03/25/23 1350)  magnesium oxide (MAG-OX) tablet 400 mg (400 mg Oral Given 03/25/23 1047)    ED Course/ Medical Decision Making/ A&P                             Medical Decision Making Amount and/or Complexity of Data Reviewed Labs: ordered. Radiology: ordered.  Risk OTC drugs. Prescription drug management.    88 year old male with medical history significant for DM, HLD, HTN who presents to the emergency department with roughly 12 hours of nausea, vomiting, diarrhea.  The patient denies any significant abdominal pain.  He states that he has had roughly 3 episodes of NBNB emesis over the past night.  He has had about 4 episodes of watery diarrhea.  He denies any coffee-ground emesis or hematemesis.  He denies any melena or hematochezia.  With EMS his CBG was 173 and he was found to be vitally stable.  He feels slightly dehydrated.  He was administered 500 cc of normal saline and route with EMS.  He arrives hemodynamically stable continuing to complain of nausea.  He denies any chest pain or shortness of breath, no fever, chills or cough.  He continues to deny any abdominal pain.  On arrival, the patient was vitally stable, afebrile, temperature 97.4, not tachycardic or tachypneic, hypertensive BP 177/104, saturating 100% on room air.  Physical exam significant for a normal abdominal exam with no tenderness to palpation, no distention.  Suspect likely viral gastroenteritis, low concern  for acute intra-abdominal abnormality with no tenderness, no abdominal pain.  No evidence for GI bleed with no coffee-ground emesis, no melena or hematochezia reported.  Patient  denies any chest pain, lower suspicion for ACS.  EKG revealed sinus rhythm, ventricular rate 78, PVC present, no STEMI.  A chest x-ray was performed which revealed no acute cardiopulmonary disease.  Laboratory evaluation significant for CMP with hypokalemia to 3.2, hyponatremia to 131, otherwise generally unremarkable, hypomagnesemia noted to 1.3, lipase normal, troponin normal, CBC without a leukocytosis, mild anemia present to 12.0.  The patient's potassium was replenished orally and his magnesium was replenished IV and orally.  He was fluid resuscitated with an LR bolus.  Symptoms are consistent with likely gastroenteritis.  Patient pending a p.o. challenge and repeat potassium and magnesium to ensure improvement in his electrolytes.  Pending p.o. challenge, plan for likely discharge home.  Signout given to Dr. Estell Harpin at [phone] pending reassessment.    Final Clinical Impression(s) / ED Diagnoses Final diagnoses:  Gastroenteritis  Hypomagnesemia  Hypokalemia    Rx / DC Orders ED Discharge Orders          Ordered    ondansetron (ZOFRAN-ODT) 4 MG disintegrating tablet  Every 8 hours PRN        03/25/23 1523    loperamide (IMODIUM A-D) 2 MG tablet  2 times daily PRN        03/25/23 1523              Ernie Avena, MD 03/25/23 1636

## 2023-03-25 NOTE — Discharge Instructions (Addendum)
Your symptoms are consistent with gastroenteritis which should run its course.  The main stay of treatment is to ensure that you continue to stay hydrated.  You also had low magnesium and potassium which can occur in the setting of nausea and vomiting and diarrhea.  This was replenished orally and IV.   Double up on your potassium for 5 days and then follow-up next week with your doctor.  Return if problems

## 2023-03-25 NOTE — ED Triage Notes (Signed)
EMS reports from home, c/o nausea and emesis since last night. Denies pain or any other issue.  BP 150/85 HR 61 RR 16 Sp02 96 RA CBG 173  20ga L forearm NS enroute.

## 2023-03-29 ENCOUNTER — Encounter: Payer: Self-pay | Admitting: Internal Medicine

## 2023-03-29 ENCOUNTER — Ambulatory Visit (INDEPENDENT_AMBULATORY_CARE_PROVIDER_SITE_OTHER): Payer: Medicare Other | Admitting: Internal Medicine

## 2023-03-29 VITALS — BP 124/78 | HR 62 | Temp 97.9°F | Ht 75.0 in | Wt 203.0 lb

## 2023-03-29 DIAGNOSIS — J309 Allergic rhinitis, unspecified: Secondary | ICD-10-CM | POA: Diagnosis not present

## 2023-03-29 DIAGNOSIS — E871 Hypo-osmolality and hyponatremia: Secondary | ICD-10-CM | POA: Diagnosis not present

## 2023-03-29 DIAGNOSIS — R112 Nausea with vomiting, unspecified: Secondary | ICD-10-CM | POA: Diagnosis not present

## 2023-03-29 DIAGNOSIS — E876 Hypokalemia: Secondary | ICD-10-CM | POA: Diagnosis not present

## 2023-03-29 DIAGNOSIS — J069 Acute upper respiratory infection, unspecified: Secondary | ICD-10-CM | POA: Diagnosis not present

## 2023-03-29 DIAGNOSIS — N1831 Chronic kidney disease, stage 3a: Secondary | ICD-10-CM

## 2023-03-29 LAB — BASIC METABOLIC PANEL
BUN: 21 mg/dL (ref 6–23)
CO2: 30 mEq/L (ref 19–32)
Calcium: 9.4 mg/dL (ref 8.4–10.5)
Chloride: 98 mEq/L (ref 96–112)
Creatinine, Ser: 1.37 mg/dL (ref 0.40–1.50)
GFR: 46.83 mL/min — ABNORMAL LOW (ref >60.00)
Glucose, Bld: 136 mg/dL — ABNORMAL HIGH (ref 70–99)
Potassium: 4.1 mEq/L (ref 3.5–5.1)
Sodium: 135 mEq/L (ref 135–145)

## 2023-03-29 LAB — MAGNESIUM: Magnesium: 1.5 mg/dL (ref 1.5–2.5)

## 2023-03-29 MED ORDER — OMEPRAZOLE 40 MG PO CPDR: 40.0000 mg | DELAYED_RELEASE_CAPSULE | Freq: Every day | ORAL | 3 refills | Status: DC

## 2023-03-29 MED ORDER — AZITHROMYCIN 250 MG PO TABS: ORAL_TABLET | ORAL | 1 refills | Status: AC

## 2023-03-29 MED ORDER — METHYLPREDNISOLONE ACETATE 80 MG/ML IJ SUSP
80.0000 mg | Freq: Once | INTRAMUSCULAR | Status: AC
Start: 2023-03-29 — End: 2023-03-29
  Administered 2023-03-29: 80 mg via INTRAMUSCULAR

## 2023-03-29 MED ORDER — PREDNISONE 10 MG PO TABS: ORAL_TABLET | ORAL | 0 refills | Status: DC

## 2023-03-29 NOTE — Patient Instructions (Addendum)
You had the steroid shot today  Please take all new medication as prescribed - the antibiotic, prednisone  Please continue all other medications as before, and refills have been done if requested.  Please have the pharmacy call with any other refills you may need.  Please continue your efforts at being more active, low cholesterol diet, and weight control.  You are otherwise up to date with prevention measures today.  Please keep your appointments with your specialists as you may have planned  Please go to the LAB at the blood drawing area for the tests to be done - just the few tests to recheck the Sodium, Potassium, Kidney, calcium and magnesium levels.    You will be contacted by phone if any changes need to be made immediately.  Otherwise, you will receive a letter about your results with an explanation, but please check with MyChart first.  Please remember to sign up for MyChart if you have not done so, as this will be important to you in the future with finding out test results, communicating by private email, and scheduling acute appointments online when needed.  Please make an Appointment to return in Oct 24, or sooner if needed

## 2023-03-29 NOTE — Progress Notes (Unsigned)
Patient ID: Joseph Rocha, male   DOB: 08/27/1935, 88 y.o.   MRN: [id number]        Chief Complaint: follow up ED visit 5/20 with persistent nausea vomit during BM episode; uri, allergies, low sodium, potassium calcium and hx of low magnesium       HPI:  Joseph Rocha is a 88 y.o. male here with c/o above, tx with IVF and zofran, found to have mild low K 3.2 and Na 131, able for d/c after po challenge with zofran odt and immodium prn.  Since then no further GI symptoms, but Does have several wks ongoing nasal allergy symptoms with clearish congestion, itch and sneezing, without fever, pain, ST, cough, swelling or wheezing.   Pt denies polydipsia, polyuria, or new focal neuro s/s. ,  Pt denies fever, wt loss, night sweats, loss of appetite, or other constitutional symptoms  Hsa not been taking the zyrtec D recently.  Also , Here with 2-3 days acute onset fever, facial pain, pressure, headache, general weakness and malaise, and greenish d/c, with mild ST and cough.         Wt Readings from Last 3 Encounters:  03/29/23 203 lb (92.1 kg)  02/21/23 208 lb (94.3 kg)  01/15/23 206 lb (93.4 kg)   BP Readings from Last 3 Encounters:  03/29/23 124/78  03/25/23 (!) 158/88  02/21/23 120/80         Past Medical History:  Diagnosis Date   Allergy    Anemia    Diabetes mellitus    Elevated PSA    Hyperlipidemia    Hypertension    Hypogonadism male    OA (osteoarthritis)    Pituitary abnormality (HCC) 2004   Past Surgical History:  Procedure Laterality Date   DOPPLER ECHOCARDIOGRAPHY  12/04/2001   ELECTROCARDIOGRAM  03/25/2006   EP IMPLANTABLE DEVICE N/A 03/16/2016   Procedure: Loop Recorder Insertion;  Surgeon: Duke Salvia, MD;  Location: [facility];  Service: Cardiovascular;  Laterality: N/A;   KNEE ARTHROSCOPY Left    LUMBAR DISC SURGERY     MENISCUS REPAIR Right may 2015   TONSILLECTOMY     TRANSPHENOIDAL / TRANSNASAL HYPOPHYSECTOMY / RESECTION PITUITARY TUMOR  2005    reports  that he has never smoked. He has never used smokeless tobacco. He reports that he does not drink alcohol and does not use drugs. family history includes Cancer in his brother; Diabetes Mellitus II in his mother; Healthy in his daughter; Heart attack in his father. Allergies  Allergen Reactions   Lipitor [Atorvastatin] Other (See Comments)    Myalgias, cramps in hand   Current Outpatient Medications on File Prior to Visit  Medication Sig Dispense Refill   albuterol (VENTOLIN HFA) 108 (90 Base) MCG/ACT inhaler Inhale 2 puffs into the lungs every 6 (six) hours as needed for wheezing or shortness of breath. 8 g 0   aspirin EC 81 MG tablet Take 1 tablet (81 mg total) by mouth daily. Swallow whole. 30 tablet 11   Azelastine HCl 137 MCG/SPRAY SOLN      calcium carbonate (OS-CAL) 600 MG TABS Take 600 mg by mouth daily.     cetirizine-pseudoephedrine (ZYRTEC-D) 5-120 MG tablet Take 1 tablet by mouth daily. 90 tablet 2   ciprofloxacin-dexamethasone (CIPRODEX) OTIC suspension Place 4 drops into the left ear 2 (two) times daily. 7.5 mL 0   diphenoxylate-atropine (LOMOTIL) 2.5-0.025 MG tablet Take 1 tablet by mouth 4 (four) times daily as needed for diarrhea or  loose stools. 30 tablet 1   ferrous sulfate 325 (65 FE) MG EC tablet Take by mouth.     finasteride (PROSCAR) 5 MG tablet TAKE 1 TABLET DAILY 90 tablet 1   fludrocortisone (FLORINEF) 0.1 MG tablet TAKE 1 TABLET DAILY 90 tablet 1   fluticasone (FLONASE) 50 MCG/ACT nasal spray Place 2 sprays into both nostrils daily. 16 g 6   folic acid (FOLVITE) 1 MG tablet TAKE 1 TABLET DAILY 90 tablet 3   guaiFENesin (MUCINEX) 600 MG 12 hr tablet Take 2 tablets (1,200 mg total) by mouth 2 (two) times daily as needed. 60 tablet 1   IRON PO Take by mouth.     KLOR-CON M20 20 MEQ tablet Take 20 mEq by mouth daily.      levothyroxine (SYNTHROID) 50 MCG tablet Take 1 tablet (50 mcg total) by mouth daily. 90 tablet 1   loperamide (IMODIUM A-D) 2 MG tablet Take 1 tablet  (2 mg total) by mouth 2 (two) times daily as needed for diarrhea or loose stools. 30 tablet 3   metFORMIN (GLUCOPHAGE) 500 MG tablet Take 1 tablet (500 mg total) by mouth 2 (two) times daily with a meal. 180 tablet 3   methocarbamol (ROBAXIN) 500 MG tablet 1 tab by mouth at bedtime as needed 90 tablet 1   Multiple Vitamins-Minerals (CENTRUM SILVER PO) Take 1 tablet by mouth daily.     Omega-3 Fatty Acids (FISH OIL) 1000 MG CAPS Take 1 capsule by mouth daily.     ondansetron (ZOFRAN-ODT) 4 MG disintegrating tablet Take 1 tablet (4 mg total) by mouth every 8 (eight) hours as needed for nausea or vomiting. 20 tablet 0   promethazine-dextromethorphan (PROMETHAZINE-DM) 6.25-15 MG/5ML syrup Take 5 mLs by mouth 2 (two) times daily as needed. 118 mL 0   repaglinide (PRANDIN) 2 MG tablet Take 1 tablet (2 mg total) by mouth 2 (two) times daily before a meal. 180 tablet 3   rosuvastatin (CRESTOR) 5 MG tablet TAKE 1 TABLET DAILY 90 tablet 3   Tamsulosin HCl (FLOMAX) 0.4 MG CAPS Take 0.4 mg by mouth daily.     Testosterone 30 MG/ACT SOLN Place 2 Pump onto the skin daily.     traMADol (ULTRAM) 50 MG tablet Take 1 tablet (50 mg total) by mouth every 6 (six) hours as needed. 15 tablet 0   triamcinolone (NASACORT) 55 MCG/ACT AERO nasal inhaler Place 2 sprays into the nose daily. 1 each 12   No current facility-administered medications on file prior to visit.        ROS:  All others reviewed and negative.  Objective        PE:  BP 124/78 (BP Location: Left Arm, Patient Position: Sitting, Cuff Size: Normal)   Pulse 62   Temp 97.9 F (36.6 C) (Oral)   Ht 6\' 3"  (1.905 m)   Wt 203 lb (92.1 kg)   SpO2 98%   BMI 25.37 kg/m                 Constitutional: Pt appears in NAD               HENT: Head: NCAT.                Right Ear: External ear normal.                 Left Ear: External ear normal. Bilat tm's with mild erythema.  Max sinus areas mild tender.  Pharynx with mild erythema, no  exudate                Eyes: . Pupils are equal, round, and reactive to light. Conjunctivae and EOM are normal               Nose: without d/c or deformity               Neck: Neck supple. Gross normal ROM               Cardiovascular: Normal rate and regular rhythm.                 Pulmonary/Chest: Effort normal and breath sounds without rales or wheezing.                Abd:  Soft, NT, ND, + BS, no organomegaly               Neurological: Pt is alert. At baseline orientation, motor grossly intact               Skin: Skin is warm. No rashes, no other new lesions, LE edema - none               Psychiatric: Pt behavior is normal without agitation   Micro: none  Cardiac tracings I have personally interpreted today:  none  Pertinent Radiological findings (summarize): none   Lab Results  Component Value Date   WBC 9.1 03/25/2023   HGB 12.0 (L) 03/25/2023   HCT 34.4 (L) 03/25/2023   PLT 160 03/25/2023   GLUCOSE 136 (H) 03/29/2023   CHOL 176 02/21/2023   TRIG 109.0 02/21/2023   HDL 54.70 02/21/2023   LDLCALC 99 02/21/2023   ALT 27 03/25/2023   AST 30 03/25/2023   NA 135 03/29/2023   K 4.1 03/29/2023   CL 98 03/29/2023   CREATININE 1.37 03/29/2023   BUN 21 03/29/2023   CO2 30 03/29/2023   TSH 2.35 02/21/2023   PSA 3.04 07/07/2014   HGBA1C 6.8 (H) 02/21/2023   MICROALBUR <0.7 02/21/2023   Assessment/Plan:  Joseph Rocha is a 88 y.o. Black or African American [2] male with  has a past medical history of Allergy, Anemia, Diabetes mellitus, Elevated PSA, Hyperlipidemia, Hypertension, Hypogonadism male, OA (osteoarthritis), and Pituitary abnormality (HCC) (2004).  Allergic rhinitis Mild to mod, for restart zyrtec, and depomedrol 80 mg,  to f/u any worsening symptoms or concerns  Nausea & vomiting Etiology unclear, apparently resolved  to f/u any worsening symptoms or concerns   Hypocalcemia For f/u lab  Hyponatremia For f/u lab  CKD (chronic kidney disease), stage III (HCC) Lab Results   Component Value Date   CREATININE 1.37 03/29/2023   Stable overall, cont to avoid nephrotoxins   Hypomagnesemia For f/u lab  Hypokalemia For f/u lab  Acute upper respiratory infection Mild to mod, for antibx course zpack, to f/u any worsening symptoms or concerns  Followup: Return if symptoms worsen or fail to improve.  Oliver Barre, MD 04/01/2023 1:36 PM [facility]

## 2023-04-01 ENCOUNTER — Encounter: Payer: Self-pay | Admitting: Internal Medicine

## 2023-04-01 NOTE — Assessment & Plan Note (Signed)
Lab Results  Component Value Date   CREATININE 1.37 03/29/2023   Stable overall, cont to avoid nephrotoxins

## 2023-04-01 NOTE — Assessment & Plan Note (Signed)
For f/u lab 

## 2023-04-01 NOTE — Assessment & Plan Note (Signed)
Mild to mod, for restart zyrtec, and depomedrol 80 mg,  to f/u any worsening symptoms or concerns

## 2023-04-01 NOTE — Assessment & Plan Note (Signed)
Mild to mod, for antibx course zpack,  to f/u any worsening symptoms or concerns 

## 2023-04-01 NOTE — Assessment & Plan Note (Signed)
Etiology unclear, apparently resolved  to f/u any worsening symptoms or concerns

## 2023-04-15 DIAGNOSIS — N401 Enlarged prostate with lower urinary tract symptoms: Secondary | ICD-10-CM | POA: Diagnosis not present

## 2023-04-16 ENCOUNTER — Other Ambulatory Visit: Payer: Self-pay | Admitting: Internal Medicine

## 2023-04-16 DIAGNOSIS — E119 Type 2 diabetes mellitus without complications: Secondary | ICD-10-CM

## 2023-04-22 DIAGNOSIS — R3912 Poor urinary stream: Secondary | ICD-10-CM | POA: Diagnosis not present

## 2023-04-22 DIAGNOSIS — E291 Testicular hypofunction: Secondary | ICD-10-CM | POA: Diagnosis not present

## 2023-04-22 DIAGNOSIS — R351 Nocturia: Secondary | ICD-10-CM | POA: Diagnosis not present

## 2023-04-22 DIAGNOSIS — N401 Enlarged prostate with lower urinary tract symptoms: Secondary | ICD-10-CM | POA: Diagnosis not present

## 2023-04-23 ENCOUNTER — Other Ambulatory Visit: Payer: Self-pay | Admitting: Internal Medicine

## 2023-04-23 ENCOUNTER — Other Ambulatory Visit: Payer: Self-pay

## 2023-05-28 ENCOUNTER — Ambulatory Visit (INDEPENDENT_AMBULATORY_CARE_PROVIDER_SITE_OTHER): Payer: Medicare Other | Admitting: Internal Medicine

## 2023-05-28 ENCOUNTER — Encounter: Payer: Self-pay | Admitting: Internal Medicine

## 2023-05-28 VITALS — BP 124/70 | HR 55 | Temp 98.0°F | Ht 75.0 in | Wt 206.0 lb

## 2023-05-28 DIAGNOSIS — E559 Vitamin D deficiency, unspecified: Secondary | ICD-10-CM

## 2023-05-28 DIAGNOSIS — N289 Disorder of kidney and ureter, unspecified: Secondary | ICD-10-CM

## 2023-05-28 DIAGNOSIS — E7800 Pure hypercholesterolemia, unspecified: Secondary | ICD-10-CM

## 2023-05-28 DIAGNOSIS — R6889 Other general symptoms and signs: Secondary | ICD-10-CM

## 2023-05-28 DIAGNOSIS — Z7984 Long term (current) use of oral hypoglycemic drugs: Secondary | ICD-10-CM

## 2023-05-28 DIAGNOSIS — U071 COVID-19: Secondary | ICD-10-CM | POA: Insufficient documentation

## 2023-05-28 DIAGNOSIS — E1165 Type 2 diabetes mellitus with hyperglycemia: Secondary | ICD-10-CM | POA: Diagnosis not present

## 2023-05-28 DIAGNOSIS — E039 Hypothyroidism, unspecified: Secondary | ICD-10-CM

## 2023-05-28 LAB — POC COVID19 BINAXNOW: SARS Coronavirus 2 Ag: POSITIVE — AB

## 2023-05-28 MED ORDER — NIRMATRELVIR/RITONAVIR (PAXLOVID) TABLET (RENAL DOSING): 2.0000 | ORAL_TABLET | Freq: Two times a day (BID) | ORAL | 0 refills | Status: AC

## 2023-05-28 MED ORDER — METHYLPREDNISOLONE ACETATE 80 MG/ML IJ SUSP
80.0000 mg | Freq: Once | INTRAMUSCULAR | Status: AC
Start: 2023-05-28 — End: 2023-05-28
  Administered 2023-05-28: 80 mg via INTRAMUSCULAR

## 2023-05-28 MED ORDER — ONDANSETRON 4 MG PO TBDP: 4.0000 mg | ORAL_TABLET | Freq: Three times a day (TID) | ORAL | 0 refills | Status: DC | PRN

## 2023-05-28 MED ORDER — HYDROCODONE BIT-HOMATROP MBR 5-1.5 MG/5ML PO SOLN: 5.0000 mL | Freq: Four times a day (QID) | ORAL | 0 refills | Status: AC | PRN

## 2023-05-28 NOTE — Assessment & Plan Note (Signed)
Lab Results  Component Value Date   LDLCALC 99 02/21/2023   Uncontrolled, goal ldl < 70,, pt to continue current statin crestor 5 mg every day, declines further change for now

## 2023-05-28 NOTE — Assessment & Plan Note (Signed)
Lab Results  Component Value Date   CREATININE 1.37 03/29/2023   Stable overall, cont to avoid nephrotoxins

## 2023-05-28 NOTE — Patient Instructions (Signed)
You had the steroid shot today  Please take all new medication as prescribed - the paxlovid, cough medicine, and nausea medication as needed  Please continue all other medications as before, and refills have been done if requested.  Please have the pharmacy call with any other refills you may need.  Please keep your appointments with your specialists as you may have planned

## 2023-05-28 NOTE — Progress Notes (Signed)
Patient ID: Joseph Rocha, male   DOB: 12-Mar-1935, 88 y.o.   MRN: [id number]        Chief Complaint: follow up uri symtpoms, now covid + , low vit d, low thryoid, dm       HPI:  Joseph Rocha is a 88 y.o. male  Here with 2-3 days acute onset fever, facial pain, pressure, headache, general weakness and malaise, and yellowish d/c, with mild ST and cough, but pt denies chest pain, wheezing, increased sob or doe, orthopnea, PND, increased LE swelling, palpitations, dizziness or syncope.  Wife ill with covid a few days prior to his onset symptoms.  Denies hyper or hypo thyroid symptoms such as voice, skin or hair change.   Pt denies polydipsia, polyuria, or new focal neuro s/s.        Wt Readings from Last 3 Encounters:  05/28/23 206 lb (93.4 kg)  03/29/23 203 lb (92.1 kg)  02/21/23 208 lb (94.3 kg)   BP Readings from Last 3 Encounters:  05/28/23 124/70  03/29/23 124/78  03/25/23 (!) 158/88         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   Past Surgical History:  Procedure Laterality Date   DOPPLER ECHOCARDIOGRAPHY  12/04/2001   ELECTROCARDIOGRAM  03/25/2006   EP IMPLANTABLE DEVICE N/A 03/16/2016   Procedure: Loop Recorder Insertion;  Surgeon: Duke Salvia, MD;  Location: [facility];  Service: Cardiovascular;  Laterality: N/A;   KNEE ARTHROSCOPY Left    LUMBAR DISC SURGERY     MENISCUS REPAIR Right may 2015   TONSILLECTOMY     TRANSPHENOIDAL / TRANSNASAL HYPOPHYSECTOMY / RESECTION PITUITARY TUMOR  2005    reports that he has never smoked. He has never used smokeless tobacco. He reports that he does not drink alcohol and does not use drugs. family history includes Cancer in his brother; Diabetes Mellitus II in his mother; Healthy in his daughter;  Heart attack in his father. Allergies  Allergen Reactions   Lipitor [Atorvastatin] Other (See Comments)    Myalgias, cramps in hand   Current Outpatient Medications on File Prior to Visit  Medication Sig Dispense Refill   albuterol (VENTOLIN HFA) 108 (90 Base) MCG/ACT inhaler Inhale 2 puffs into the lungs every 6 (six) hours as needed for wheezing or shortness of breath. 8 g 0   aspirin EC 81 MG tablet Take 1 tablet (81 mg total) by mouth daily. Swallow whole. 30 tablet 11   Azelastine HCl 137 MCG/SPRAY SOLN      calcium carbonate (OS-CAL) 600 MG TABS Take 600 mg by mouth daily.     cetirizine-pseudoephedrine (ZYRTEC-D) 5-120 MG tablet Take 1 tablet by mouth daily. 90 tablet 2   ciprofloxacin-dexamethasone (CIPRODEX) OTIC suspension Place 4 drops into the left ear 2 (two) times daily. 7.5 mL 0   diphenoxylate-atropine (LOMOTIL) 2.5-0.025 MG tablet Take 1 tablet by mouth 4 (four) times daily as needed for diarrhea or loose stools. 30 tablet 1   ferrous sulfate 325 (65 FE) MG EC tablet Take by mouth.     finasteride (PROSCAR) 5 MG tablet TAKE 1 TABLET DAILY 90 tablet 1   fludrocortisone (FLORINEF) 0.1 MG tablet TAKE 1 TABLET  DAILY 90 tablet 1   fluticasone (FLONASE) 50 MCG/ACT nasal spray Place 2 sprays into both nostrils daily. 16 g 6   folic acid (FOLVITE) 1 MG tablet TAKE 1 TABLET DAILY 90 tablet 3   guaiFENesin (MUCINEX) 600 MG 12 hr tablet Take 2 tablets (1,200 mg total) by mouth 2 (two) times daily as needed. 60 tablet 1   IRON PO Take by mouth.     KLOR-CON M20 20 MEQ tablet Take 20 mEq by mouth daily.      levothyroxine (SYNTHROID) 50 MCG tablet TAKE 1 TABLET DAILY 90 tablet 2   loperamide (IMODIUM A-D) 2 MG tablet Take 1 tablet (2 mg total) by mouth 2 (two) times daily as needed for diarrhea or loose stools. 30 tablet 3   metFORMIN (GLUCOPHAGE) 500 MG tablet Take 1 tablet (500 mg total) by mouth 2 (two) times daily with a meal. 180 tablet 3   methocarbamol (ROBAXIN) 500 MG tablet 1  tab by mouth at bedtime as needed 90 tablet 1   Multiple Vitamins-Minerals (CENTRUM SILVER PO) Take 1 tablet by mouth daily.     Omega-3 Fatty Acids (FISH OIL) 1000 MG CAPS Take 1 capsule by mouth daily.     omeprazole (PRILOSEC) 40 MG capsule TAKE 1 CAPSULE DAILY 90 capsule 2   predniSONE (DELTASONE) 10 MG tablet 2 tabs by mouth per day for 5 days 10 tablet 0   promethazine-dextromethorphan (PROMETHAZINE-DM) 6.25-15 MG/5ML syrup Take 5 mLs by mouth 2 (two) times daily as needed. 118 mL 0   repaglinide (PRANDIN) 2 MG tablet Take 1 tablet (2 mg total) by mouth 2 (two) times daily before a meal. 180 tablet 3   rosuvastatin (CRESTOR) 5 MG tablet TAKE 1 TABLET DAILY 90 tablet 3   Tamsulosin HCl (FLOMAX) 0.4 MG CAPS Take 0.4 mg by mouth daily.     Testosterone 30 MG/ACT SOLN Place 2 Pump onto the skin daily.     traMADol (ULTRAM) 50 MG tablet Take 1 tablet (50 mg total) by mouth every 6 (six) hours as needed. 15 tablet 0   triamcinolone (NASACORT) 55 MCG/ACT AERO nasal inhaler Place 2 sprays into the nose daily. 1 each 12   No current facility-administered medications on file prior to visit.        ROS:  All others reviewed and negative.  Objective        PE:  BP 124/70 (BP Location: Right Arm, Patient Position: Sitting, Cuff Size: Normal)   Pulse (!) 55   Temp 98 F (36.7 C) (Oral)   Ht 6\' 3"  (1.905 m)   Wt 206 lb (93.4 kg)   SpO2 96%   BMI 25.75 kg/m                 Constitutional: Pt appears in NAD               HENT: Head: NCAT.                Right Ear: External ear normal.                 Left Ear: External ear normal. Bilat tm's with mild erythema.  Max sinus areas non tender.  Pharynx with mild erythema, no exudate               Eyes: . Pupils are equal, round, and reactive to light. Conjunctivae and EOM are normal  Nose: without d/c or deformity               Neck: Neck supple. Gross normal ROM               Cardiovascular: Normal rate and regular rhythm.                  Pulmonary/Chest: Effort normal and breath sounds without rales or wheezing.                Abd:  Soft, NT, ND, + BS, no organomegaly               Neurological: Pt is alert. At baseline orientation, motor grossly intact               Skin: Skin is warm. No rashes, no other new lesions, LE edema - none               Psychiatric: Pt behavior is normal without agitation   Micro: none  Cardiac tracings I have personally interpreted today:  none  Pertinent Radiological findings (summarize): none   Lab Results  Component Value Date   WBC 9.1 03/25/2023   HGB 12.0 (L) 03/25/2023   HCT 34.4 (L) 03/25/2023   PLT 160 03/25/2023   GLUCOSE 136 (H) 03/29/2023   CHOL 176 02/21/2023   TRIG 109.0 02/21/2023   HDL 54.70 02/21/2023   LDLCALC 99 02/21/2023   ALT 27 03/25/2023   AST 30 03/25/2023   NA 135 03/29/2023   K 4.1 03/29/2023   CL 98 03/29/2023   CREATININE 1.37 03/29/2023   BUN 21 03/29/2023   CO2 30 03/29/2023   TSH 2.35 02/21/2023   PSA 3.04 07/07/2014   HGBA1C 6.8 (H) 02/21/2023   MICROALBUR <0.7 02/21/2023   POCT -  SARS Coronavirus 2 Ag Negative Positive Abnormal     Assessment/Plan:  Joseph Rocha is a 88 y.o. Black or African American [2] male with  has a past medical history of Allergy, Anemia, Blood transfusion without reported diagnosis, Diabetes mellitus, Elevated PSA, Hyperlipidemia, Hypertension, Hypogonadism male, OA (osteoarthritis), and Pituitary abnormality (HCC) (11/05/2002).  COVID-19 virus infection Mild to mod, for antibx course paxlovid renal dosed, depomedrol 80 mg IM, zofran prn ansuea, cough med prn,  to f/u any worsening symptoms or concerns  Diabetes (HCC) Lab Results  Component Value Date   HGBA1C 6.8 (H) 02/21/2023   Stable, pt to continue current medical treatment prandin 2 mg bid, metformin 500 bid   Hypothyroidism Lab Results  Component Value Date   TSH 2.35 02/21/2023   Stable, pt to continue levothyroxine 50 mg  qd   Renal insufficiency Lab Results  Component Value Date   CREATININE 1.37 03/29/2023   Stable overall, cont to avoid nephrotoxins   Vitamin D deficiency Last vitamin D Lab Results  Component Value Date   VD25OH 69.02 02/21/2023   Stable, cont oral replacement   HYPERCHOLESTEROLEMIA Lab Results  Component Value Date   LDLCALC 99 02/21/2023   Uncontrolled, goal ldl < 70,, pt to continue current statin crestor 5 mg every day, declines further change for now  Followup: Return if symptoms worsen or fail to improve.  Oliver Barre, MD 05/28/2023 7:35 PM [facility] Internal Medicine

## 2023-05-28 NOTE — Assessment & Plan Note (Signed)
Lab Results  Component Value Date   TSH 2.35 02/21/2023   Stable, pt to continue levothyroxine 50 mg qd

## 2023-05-28 NOTE — Assessment & Plan Note (Signed)
Mild to mod, for antibx course paxlovid renal dosed, depomedrol 80 mg IM, zofran prn ansuea, cough med prn,  to f/u any worsening symptoms or concerns

## 2023-05-28 NOTE — Assessment & Plan Note (Signed)
Lab Results  Component Value Date   HGBA1C 6.8 (H) 02/21/2023   Stable, pt to continue current medical treatment prandin 2 mg bid, metformin 500 bid

## 2023-05-28 NOTE — Assessment & Plan Note (Signed)
Last vitamin D Lab Results  Component Value Date   VD25OH 69.02 02/21/2023   Stable, cont oral replacement

## 2023-06-17 ENCOUNTER — Other Ambulatory Visit: Payer: Self-pay | Admitting: Internal Medicine

## 2023-07-17 ENCOUNTER — Other Ambulatory Visit: Payer: Self-pay

## 2023-07-17 ENCOUNTER — Ambulatory Visit (INDEPENDENT_AMBULATORY_CARE_PROVIDER_SITE_OTHER): Payer: Medicare Other

## 2023-07-17 ENCOUNTER — Encounter: Payer: Self-pay | Admitting: Internal Medicine

## 2023-07-17 ENCOUNTER — Ambulatory Visit (INDEPENDENT_AMBULATORY_CARE_PROVIDER_SITE_OTHER): Payer: Medicare Other | Admitting: Internal Medicine

## 2023-07-17 ENCOUNTER — Ambulatory Visit (INDEPENDENT_AMBULATORY_CARE_PROVIDER_SITE_OTHER): Payer: Medicare Other | Admitting: Family Medicine

## 2023-07-17 VITALS — BP 122/76 | HR 67 | Temp 98.1°F | Ht 75.0 in | Wt 205.0 lb

## 2023-07-17 VITALS — BP 122/76 | HR 67 | Ht 75.0 in | Wt 205.0 lb

## 2023-07-17 DIAGNOSIS — E538 Deficiency of other specified B group vitamins: Secondary | ICD-10-CM | POA: Diagnosis not present

## 2023-07-17 DIAGNOSIS — E7800 Pure hypercholesterolemia, unspecified: Secondary | ICD-10-CM

## 2023-07-17 DIAGNOSIS — M25561 Pain in right knee: Secondary | ICD-10-CM | POA: Diagnosis not present

## 2023-07-17 DIAGNOSIS — Z23 Encounter for immunization: Secondary | ICD-10-CM | POA: Diagnosis not present

## 2023-07-17 DIAGNOSIS — E559 Vitamin D deficiency, unspecified: Secondary | ICD-10-CM

## 2023-07-17 DIAGNOSIS — E1165 Type 2 diabetes mellitus with hyperglycemia: Secondary | ICD-10-CM

## 2023-07-17 DIAGNOSIS — M1711 Unilateral primary osteoarthritis, right knee: Secondary | ICD-10-CM | POA: Diagnosis not present

## 2023-07-17 DIAGNOSIS — N1831 Chronic kidney disease, stage 3a: Secondary | ICD-10-CM | POA: Diagnosis not present

## 2023-07-17 DIAGNOSIS — G8929 Other chronic pain: Secondary | ICD-10-CM

## 2023-07-17 DIAGNOSIS — E039 Hypothyroidism, unspecified: Secondary | ICD-10-CM | POA: Diagnosis not present

## 2023-07-17 DIAGNOSIS — Z7984 Long term (current) use of oral hypoglycemic drugs: Secondary | ICD-10-CM

## 2023-07-17 DIAGNOSIS — G4762 Sleep related leg cramps: Secondary | ICD-10-CM | POA: Insufficient documentation

## 2023-07-17 DIAGNOSIS — M25461 Effusion, right knee: Secondary | ICD-10-CM | POA: Diagnosis not present

## 2023-07-17 LAB — BASIC METABOLIC PANEL
BUN: 22 mg/dL (ref 6–23)
CO2: 31 meq/L (ref 19–32)
Calcium: 9.6 mg/dL (ref 8.4–10.5)
Chloride: 99 meq/L (ref 96–112)
Creatinine, Ser: 1.12 mg/dL (ref 0.40–1.50)
GFR: 59.51 mL/min — ABNORMAL LOW (ref >60.00)
Glucose, Bld: 117 mg/dL — ABNORMAL HIGH (ref 70–99)
Potassium: 3.6 meq/L (ref 3.5–5.1)
Sodium: 136 meq/L (ref 135–145)

## 2023-07-17 LAB — URINALYSIS, ROUTINE W REFLEX MICROSCOPIC
Bilirubin Urine: NEGATIVE
Hgb urine dipstick: NEGATIVE
Ketones, ur: NEGATIVE
Leukocytes,Ua: NEGATIVE
Nitrite: NEGATIVE
RBC / HPF: NONE SEEN (ref 0–?)
Specific Gravity, Urine: 1.02 (ref 1.000–1.030)
Total Protein, Urine: NEGATIVE
Urine Glucose: NEGATIVE
Urobilinogen, UA: 0.2 (ref 0.0–1.0)
pH: 6 (ref 5.0–8.0)

## 2023-07-17 LAB — MICROALBUMIN / CREATININE URINE RATIO
Creatinine,U: 201.6 mg/dL
Microalb Creat Ratio: 0.4 mg/g (ref 0.0–30.0)
Microalb, Ur: 0.7 mg/dL (ref 0.0–1.9)

## 2023-07-17 LAB — HEMOGLOBIN A1C: Hgb A1c MFr Bld: 7.3 % — ABNORMAL HIGH (ref 4.6–6.5)

## 2023-07-17 LAB — CBC WITH DIFFERENTIAL/PLATELET
Basophils Absolute: 0.1 10*3/uL (ref 0.0–0.1)
Basophils Relative: 0.8 % (ref 0.0–3.0)
Eosinophils Absolute: 0.2 10*3/uL (ref 0.0–0.7)
Eosinophils Relative: 2.9 % (ref 0.0–5.0)
HCT: 38 % — ABNORMAL LOW (ref 39.0–52.0)
Hemoglobin: 12.7 g/dL — ABNORMAL LOW (ref 13.0–17.0)
Lymphocytes Relative: 33.1 % (ref 12.0–46.0)
Lymphs Abs: 2.5 10*3/uL (ref 0.7–4.0)
MCHC: 33.3 g/dL (ref 30.0–36.0)
MCV: 95.2 fl (ref 78.0–100.0)
Monocytes Absolute: 0.7 10*3/uL (ref 0.1–1.0)
Monocytes Relative: 9.4 % (ref 3.0–12.0)
Neutro Abs: 4.1 10*3/uL (ref 1.4–7.7)
Neutrophils Relative %: 53.8 % (ref 43.0–77.0)
Platelets: 235 10*3/uL (ref 150.0–400.0)
RBC: 3.99 Mil/uL — ABNORMAL LOW (ref 4.22–5.81)
RDW: 14 % (ref 11.5–15.5)
WBC: 7.7 10*3/uL (ref 4.0–10.5)

## 2023-07-17 LAB — HEPATIC FUNCTION PANEL
ALT: 16 U/L (ref 0–53)
AST: 19 U/L (ref 0–37)
Albumin: 4.1 g/dL (ref 3.5–5.2)
Alkaline Phosphatase: 54 U/L (ref 39–117)
Bilirubin, Direct: 0.1 mg/dL (ref 0.0–0.3)
Total Bilirubin: 0.5 mg/dL (ref 0.2–1.2)
Total Protein: 7.1 g/dL (ref 6.0–8.3)

## 2023-07-17 LAB — LIPID PANEL
Cholesterol: 196 mg/dL (ref 0–200)
HDL: 57.6 mg/dL (ref >39.00)
LDL Cholesterol: 116 mg/dL — ABNORMAL HIGH (ref 0–99)
NonHDL: 138.77
Total CHOL/HDL Ratio: 3
Triglycerides: 114 mg/dL (ref 0.0–149.0)
VLDL: 22.8 mg/dL (ref 0.0–40.0)

## 2023-07-17 LAB — VITAMIN D 25 HYDROXY (VIT D DEFICIENCY, FRACTURES): VITD: 78.85 ng/mL (ref 30.00–100.00)

## 2023-07-17 LAB — VITAMIN B12: Vitamin B-12: 409 pg/mL (ref 211–911)

## 2023-07-17 LAB — TSH: TSH: 2.82 u[IU]/mL (ref 0.35–5.50)

## 2023-07-17 MED ORDER — TRAMADOL HCL 50 MG PO TABS
50.0000 mg | ORAL_TABLET | Freq: Four times a day (QID) | ORAL | 0 refills | Status: DC | PRN
Start: 2023-07-17 — End: 2023-10-15

## 2023-07-17 MED ORDER — CYCLOBENZAPRINE HCL 5 MG PO TABS: 5.0000 mg | ORAL_TABLET | Freq: Three times a day (TID) | ORAL | 1 refills | Status: DC | PRN

## 2023-07-17 NOTE — Patient Instructions (Addendum)
Ok to continue the tylenol for the knees, also ok to take the OTC Voltaren gel as an anti-inflammatory for the right knee as well  Please take all new medication as prescribed - the tramadol as needed  Please continue all other medications as before, and refills have been done if requested.  Please have the pharmacy call with any other refills you may need.  Please continue your efforts at being more active, low cholesterol diet, and weight control.  Please keep your appointments with your specialists as you may have planned  Please go to the XRAY Department in the first floor for the x-ray testing  You will be contacted regarding the referral for: Sports medicine for the right knee  Please go to the LAB at the blood drawing area for the tests to be done  You will be contacted by phone if any changes need to be made immediately.  Otherwise, you will receive a letter about your results with an explanation, but please check with MyChart first.  Please remember to sign up for MyChart if you have not done so, as this will be important to you in the future with finding out test results, communicating by private email, and scheduling acute appointments online when needed.  Please make an Appointment to return in 6 months, or sooner if needed, also with Lab Appointment for testing done 3-5 days before at the FIRST FLOOR Lab (so this is for TWO appointments - please see the scheduling desk as you leave)  OK to cancel the October appt here

## 2023-07-17 NOTE — Progress Notes (Signed)
Patient ID: Joseph Rocha, male   DOB: 04-24-1935, 88 y.o.   MRN: [id number]        Chief Complaint: follow up right knee pain, dm, hld, ckd3a, low vit d. Nocturnal leg cramps       HPI:  Joseph Rocha is a 88 y.o. male here after recent cruise with more walking on the boat, with exacerbation of minor previous right knee lateral knee pain and diffuse swelling.  No falls or giveaways.  Tylenol helps somewhat.  Has chronic right foot drop after previous LS spine surgury years ago.  Just returned from cruise with good food, admits to less dietary compliance recently.  Pt denies chest pain, increased sob or doe, wheezing, orthopnea, PND, increased LE swelling, palpitations, dizziness or syncope.   Pt denies polydipsia, polyuria, or new focal neuro s/s.    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 He has never used smokeless tobacco. He reports that he  does not drink alcohol and does not use drugs. family history includes Cancer in his brother; Diabetes Mellitus II in his mother; Healthy in his daughter; Heart attack in his father. Allergies  Allergen Reactions   Lipitor [Atorvastatin] Other (See Comments)    Myalgias, cramps in hand   Current Outpatient Medications on File Prior to Visit  Medication Sig Dispense Refill   albuterol (VENTOLIN HFA) 108 (90 Base) MCG/ACT inhaler Inhale 2 puffs into the lungs every 6 (six) hours as needed for wheezing or shortness of breath. 8 g 0   aspirin EC 81 MG tablet Take 1 tablet (81 mg total) by mouth daily. Swallow whole. 30 tablet 11   Azelastine HCl 137 MCG/SPRAY SOLN      calcium carbonate (OS-CAL) 600 MG TABS Take 600 mg by mouth daily.     cetirizine-pseudoephedrine (ZYRTEC-D) 5-120 MG tablet Take 1 tablet by mouth daily. 90 tablet 2   ciprofloxacin-dexamethasone (CIPRODEX) OTIC suspension Place 4 drops into the left ear 2 (two) times daily. 7.5 mL 0   diphenoxylate-atropine (LOMOTIL) 2.5-0.025 MG tablet Take 1 tablet by mouth 4 (four) times daily as needed for diarrhea or loose stools. 30 tablet 1   ferrous sulfate 325 (65 FE) MG EC tablet Take by mouth.     finasteride (PROSCAR) 5  MG tablet TAKE 1 TABLET DAILY 90 tablet 1   fludrocortisone (FLORINEF) 0.1 MG tablet TAKE 1 TABLET DAILY 90 tablet 1   fluticasone (FLONASE) 50 MCG/ACT nasal spray Place 2 sprays into both nostrils daily. 16 g 6   folic acid (FOLVITE) 1 MG tablet TAKE 1 TABLET DAILY 90 tablet 3   guaiFENesin (MUCINEX) 600 MG 12 hr tablet Take 2 tablets (1,200 mg total) by mouth 2 (two) times daily as needed. (Patient not taking: Reported on 07/17/2023) 60 tablet 1   IRON PO Take by mouth.     KLOR-CON M20 20 MEQ tablet Take 20 mEq by mouth daily.      levothyroxine (SYNTHROID) 50 MCG tablet TAKE 1 TABLET DAILY 90 tablet 2   loperamide (IMODIUM A-D) 2 MG tablet Take 1 tablet (2 mg total) by mouth 2 (two) times daily as needed for  diarrhea or loose stools. 30 tablet 3   metFORMIN (GLUCOPHAGE) 500 MG tablet Take 1 tablet (500 mg total) by mouth 2 (two) times daily with a meal. 180 tablet 3   methocarbamol (ROBAXIN) 500 MG tablet 1 tab by mouth at bedtime as needed 90 tablet 1   Multiple Vitamins-Minerals (CENTRUM SILVER PO) Take 1 tablet by mouth daily.     Omega-3 Fatty Acids (FISH OIL) 1000 MG CAPS Take 1 capsule by mouth daily.     omeprazole (PRILOSEC) 40 MG capsule TAKE 1 CAPSULE DAILY 90 capsule 2   ondansetron (ZOFRAN-ODT) 4 MG disintegrating tablet Take 1 tablet (4 mg total) by mouth every 8 (eight) hours as needed for nausea or vomiting. 20 tablet 0   predniSONE (DELTASONE) 10 MG tablet 2 tabs by mouth per day for 5 days 10 tablet 0   promethazine-dextromethorphan (PROMETHAZINE-DM) 6.25-15 MG/5ML syrup Take 5 mLs by mouth 2 (two) times daily as needed. 118 mL 0   repaglinide (PRANDIN) 2 MG tablet Take 1 tablet (2 mg total) by mouth 2 (two) times daily before a meal. 180 tablet 3   rosuvastatin (CRESTOR) 5 MG tablet TAKE 1 TABLET DAILY 90 tablet 3   Tamsulosin HCl (FLOMAX) 0.4 MG CAPS Take 0.4 mg by mouth daily.     Testosterone 30 MG/ACT SOLN Place 2 Pump onto the skin daily.     traMADol (ULTRAM) 50 MG tablet Take 1 tablet (50 mg total) by mouth every 6 (six) hours as needed. 15 tablet 0   triamcinolone (NASACORT) 55 MCG/ACT AERO nasal inhaler Place 2 sprays into the nose daily. 1 each 12   No current facility-administered medications on file prior to visit.        ROS:  All others reviewed and negative.  Objective        PE:  BP 122/76 (BP Location: Right Arm, Patient Position: Sitting, Cuff Size: Normal)   Pulse 67   Temp 98.1 F (36.7 C) (Oral)   Ht 6\' 3"  (1.905 m)   Wt 205 lb (93 kg)   SpO2 98%   BMI 25.62 kg/m                 Constitutional: Pt appears in NAD               HENT: Head: NCAT.                Right Ear: External ear normal.                 Left Ear: External ear normal.  Eyes: . Pupils are equal, round, and reactive to light. Conjunctivae and EOM are normal               Nose: without d/c or deformity               Neck: Neck supple. Gross normal ROM               Cardiovascular: Normal rate and regular rhythm.                 Pulmonary/Chest: Effort normal and breath sounds without rales or wheezing.                Abd:  Soft, NT, ND, + BS, no organomegaly               Neurological: Pt is alert. At baseline orientation, motor grossly intact               Skin: Skin is warm. No rashes, no other new lesions, LE edema - none, right knee with 1+ effusion and heat/tender to right joint line               Psychiatric: Pt behavior is normal without agitation   Micro: none  Cardiac tracings I have personally interpreted today:  none  Pertinent Radiological findings (summarize): none   Lab Results  Component Value Date   WBC 7.7 07/17/2023   HGB 12.7 (L) 07/17/2023   HCT 38.0 (L) 07/17/2023   PLT 235.0 07/17/2023   GLUCOSE 117 (H) 07/17/2023   CHOL 196 07/17/2023   TRIG 114.0 07/17/2023   HDL 57.60 07/17/2023   LDLCALC 116 (H) 07/17/2023   ALT 16 07/17/2023   AST 19 07/17/2023   NA 136 07/17/2023   K 3.6 07/17/2023   CL 99 07/17/2023   CREATININE 1.12 07/17/2023   BUN 22 07/17/2023   CO2 31 07/17/2023   TSH 2.82 07/17/2023   PSA 3.04 07/07/2014   HGBA1C 7.3 (H) 07/17/2023   MICROALBUR 0.7 07/17/2023   Assessment/Plan:  Joseph Rocha is a 88 y.o. Black or African American [2] male with  has a past medical history of Allergy, Anemia, Blood transfusion without reported diagnosis, Diabetes mellitus, Elevated PSA, Hyperlipidemia, Hypertension, Hypogonadism male, OA (osteoarthritis), and Pituitary abnormality (HCC) (11/05/2002).  CKD (chronic kidney disease) stage 3, GFR 30-59 ml/min (HCC) Lab Results  Component Value Date   CREATININE 1.12 07/17/2023   Stable overall, cont to avoid nephrotoxins   Diabetes (HCC) Lab Results  Component  Value Date   HGBA1C 7.3 (H) 07/17/2023   Stable, goal A1c < 7.5, pt to continue current medical treatment metformiin 500 bid, prandin 2 mg bid   HYPERCHOLESTEROLEMIA Lab Results  Component Value Date   LDLCALC 116 (H) 07/17/2023   uncontrolled, pt to continue current statin crestor 5 mg - declines increase or add repatha today   Hypothyroidism Lab Results  Component Value Date   TSH 2.82 07/17/2023   Stable, pt to continue levothyroxine 50 mcg qd  Vitamin D deficiency Last vitamin D Lab Results  Component Value Date   VD25OH 78.85 07/17/2023   Stable, cont oral replacement   Right knee pain C/w worsening djd - for tramadol prn, xray, refer sports medicine  Followup: Return in about 6 months (around 01/14/2024).  Oliver Barre, MD 07/20/2023 7:41 PM [facility]  Primary Care - [facility]

## 2023-07-17 NOTE — Progress Notes (Signed)
   Rubin Payor, PhD, LAT, ATC acting as a scribe for Clementeen Graham, MD.  Joseph Rocha is a 88 y.o. male who presents to [facility] today for R knee pain that's chronic in nature, but worsened since he went on a cruise to [place] [employer]. Pt locates pain to the anterior aspect of his R knee.   R Knee swelling: yes Mechanical symptoms: no Aggravates: transitioning to stand Treatments tried: Tylenol, PCP just prescribed Tramadol  Dx imaging: 07/17/23 R knee XR   Pertinent review of systems: No fevers or chills  Relevant historical information: Diabetes.  Hyperlipidemia.   Exam:  BP 122/76   Pulse 67   Ht 6\' 3"  (1.905 m)   Wt 205 lb (93 kg)   SpO2 98%   BMI 25.62 kg/m  General: Well Developed, well nourished, and in no acute distress.   MSK: Right knee moderate effusion.  Normal motion with crepitation.  Tender palpation medial and lateral joint line. Stable ligamentous exam. Intact strength. Mild antalgic gait.    Lab and Radiology Results  Procedure: Real-time Ultrasound Guided Injection of right knee joint superior lateral patella space Device: Philips Affiniti 50G/GE Logiq Images permanently stored and available for review in PACS Verbal informed consent obtained.  Discussed risks and benefits of procedure. Warned about infection, bleeding, hyperglycemia damage to structures among others. Patient expresses understanding and agreement Time-out conducted.   Noted no overlying erythema, induration, or other signs of local infection.   Skin prepped in a sterile fashion.   Local anesthesia: Topical Ethyl chloride.   With sterile technique and under real time ultrasound guidance: 40 mg of Kenalog and 2 mL of Marcaine injected into knee joint. Fluid seen entering the joint capsule.   Completed without difficulty   Pain immediately resolved suggesting accurate placement of the medication.   Advised to call if fevers/chills, erythema,  induration, drainage, or persistent bleeding.   Images permanently stored and available for review in the ultrasound unit.  Impression: Technically successful ultrasound guided injection.    X-ray images right knee obtained today personally and independently interpreted Moderate to severe DJD worse laterally.  No acute fractures are visible. Await formal radiology review    Assessment and Plan: 88 y.o. male with right knee pain.  Pain thought to be due to an exacerbation of DJD.  He has done more activity recently as part of this cruise.  He was on the [place] cruise ship and had to do quite a lot of walking around which is more than he usually does.  Plan for steroid injection today.  Recommend also Voltaren gel and Tylenol.  Recommend against high-dose oral NSAIDs.   PDMP not reviewed this encounter. Orders Placed This Encounter  Procedures   [place] LIMITED JOINT SPACE STRUCTURES LOW RIGHT(NO LINKED CHARGES)    Order Specific Question:   Reason for Exam (SYMPTOM  OR DIAGNOSIS REQUIRED)    Answer:   right knee pain    Order Specific Question:   Preferred imaging location?    Answer:   [facility]   No orders of the defined types were placed in this encounter.    Discussed warning signs or symptoms. Please see discharge instructions. Patient expresses understanding.   The above documentation has been reviewed and is accurate and complete Clementeen Graham, M.D.

## 2023-07-17 NOTE — Patient Instructions (Addendum)
Thank you for coming in today.   You received an injection today. Seek immediate medical attention if the joint becomes red, extremely painful, or is oozing fluid.   Please use Voltaren gel (Generic Diclofenac Gel) up to 4x daily for pain as needed.  This is available over-the-counter as both the name brand Voltaren gel and the generic diclofenac gel.   Check back as needed

## 2023-07-20 ENCOUNTER — Encounter: Payer: Self-pay | Admitting: Internal Medicine

## 2023-07-20 NOTE — Assessment & Plan Note (Signed)
C/w worsening djd - for tramadol prn, xray, refer sports medicine

## 2023-07-20 NOTE — Assessment & Plan Note (Signed)
Lab Results  Component Value Date   TSH 2.82 07/17/2023   Stable, pt to continue levothyroxine 50 mcg qd

## 2023-07-20 NOTE — Assessment & Plan Note (Signed)
Last vitamin D Lab Results  Component Value Date   VD25OH 78.85 07/17/2023   Stable, cont oral replacement

## 2023-07-20 NOTE — Assessment & Plan Note (Signed)
Lab Results  Component Value Date   HGBA1C 7.3 (H) 07/17/2023   Stable, goal A1c < 7.5, pt to continue current medical treatment metformiin 500 bid, prandin 2 mg bid

## 2023-07-20 NOTE — Assessment & Plan Note (Signed)
Lab Results  Component Value Date   LDLCALC 116 (H) 07/17/2023   uncontrolled, pt to continue current statin crestor 5 mg - declines increase or add repatha today

## 2023-07-20 NOTE — Assessment & Plan Note (Signed)
Lab Results  Component Value Date   CREATININE 1.12 07/17/2023   Stable overall, cont to avoid nephrotoxins

## 2023-07-28 ENCOUNTER — Encounter: Payer: Self-pay | Admitting: Internal Medicine

## 2023-08-21 ENCOUNTER — Telehealth: Payer: Self-pay | Admitting: Gastroenterology

## 2023-08-21 NOTE — Telephone Encounter (Signed)
Since I'm retiring patients can switch to any physician they would to see without asking either physician.

## 2023-08-21 NOTE — Telephone Encounter (Signed)
Good afternoon Dr. Russella Dar,   We received a call from this patient wishing to be scheduled with Dr. Marina Goodell to establish new GI care due to your retirement. Would you be willing to approve this transfer?  Thank you.

## 2023-08-26 ENCOUNTER — Other Ambulatory Visit: Payer: Self-pay | Admitting: Internal Medicine

## 2023-08-26 ENCOUNTER — Other Ambulatory Visit: Payer: Self-pay

## 2023-08-26 DIAGNOSIS — E1165 Type 2 diabetes mellitus with hyperglycemia: Secondary | ICD-10-CM

## 2023-08-29 ENCOUNTER — Ambulatory Visit: Payer: Medicare Other | Admitting: Internal Medicine

## 2023-08-30 ENCOUNTER — Telehealth: Payer: Self-pay | Admitting: Internal Medicine

## 2023-08-30 ENCOUNTER — Ambulatory Visit: Payer: Medicare Other | Admitting: Internal Medicine

## 2023-08-30 NOTE — Telephone Encounter (Signed)
Placed on providers desk

## 2023-08-30 NOTE — Telephone Encounter (Signed)
Patient dropped off document Handicap Placard, to be filled out by provider. Patient requested to send it back via Call Patient to pick up within 7-days. Document is located in providers tray at front office.Please advise at [facility] [phone] or cell [phone]

## 2023-09-02 ENCOUNTER — Encounter: Payer: Self-pay | Admitting: Internal Medicine

## 2023-09-02 ENCOUNTER — Ambulatory Visit (INDEPENDENT_AMBULATORY_CARE_PROVIDER_SITE_OTHER): Payer: Medicare Other | Admitting: Internal Medicine

## 2023-09-02 VITALS — BP 122/76 | HR 88 | Temp 97.9°F | Ht 75.0 in | Wt 208.0 lb

## 2023-09-02 DIAGNOSIS — N3281 Overactive bladder: Secondary | ICD-10-CM | POA: Diagnosis not present

## 2023-09-02 DIAGNOSIS — N1831 Chronic kidney disease, stage 3a: Secondary | ICD-10-CM | POA: Diagnosis not present

## 2023-09-02 DIAGNOSIS — G4762 Sleep related leg cramps: Secondary | ICD-10-CM

## 2023-09-02 DIAGNOSIS — G8929 Other chronic pain: Secondary | ICD-10-CM

## 2023-09-02 DIAGNOSIS — E1169 Type 2 diabetes mellitus with other specified complication: Secondary | ICD-10-CM | POA: Diagnosis not present

## 2023-09-02 DIAGNOSIS — E559 Vitamin D deficiency, unspecified: Secondary | ICD-10-CM | POA: Diagnosis not present

## 2023-09-02 DIAGNOSIS — M545 Low back pain, unspecified: Secondary | ICD-10-CM | POA: Diagnosis not present

## 2023-09-02 DIAGNOSIS — E785 Hyperlipidemia, unspecified: Secondary | ICD-10-CM | POA: Diagnosis not present

## 2023-09-02 MED ORDER — CYCLOBENZAPRINE HCL 5 MG PO TABS: 5.0000 mg | ORAL_TABLET | Freq: Three times a day (TID) | ORAL | 2 refills | Status: DC | PRN

## 2023-09-02 MED ORDER — SOLIFENACIN SUCCINATE 5 MG PO TABS: 5.0000 mg | ORAL_TABLET | Freq: Every day | ORAL | 3 refills | Status: DC

## 2023-09-02 NOTE — Patient Instructions (Signed)
Please take all new medication as prescribed - the vesicare 5 mg per day for the bladder  Ok to change the robaxin to flexeril 5 mg three times per day as needed for muscle relaxer  Please continue all other medications as before, and refills have been done if requested.  Please have the pharmacy call with any other refills you may need.  Please continue your efforts at being more active, low cholesterol diet, and weight control.  Please keep your appointments with your specialists as you may have planned  Please make an Appointment to return in 6 months, or sooner if needed

## 2023-09-02 NOTE — Telephone Encounter (Signed)
Form picked up by Pt at his appt on 09/02/23.

## 2023-09-06 ENCOUNTER — Encounter: Payer: Self-pay | Admitting: Internal Medicine

## 2023-09-06 DIAGNOSIS — N3281 Overactive bladder: Secondary | ICD-10-CM | POA: Insufficient documentation

## 2023-09-06 NOTE — Assessment & Plan Note (Signed)
Lab Results  Component Value Date   CREATININE 1.12 07/17/2023   Stable overall, cont to avoid nephrotoxins

## 2023-09-06 NOTE — Assessment & Plan Note (Signed)
Lab Results  Component Value Date   LDLCALC 116 (H) 07/17/2023   Uncontrolled, pt to continue current statin crestor 5 mg every day, declines increase or other change for now

## 2023-09-06 NOTE — Assessment & Plan Note (Signed)
Uncontrolled, ok for change robaxin to flexeril at bedtime prn  to f/u any worsening symptoms or concerns

## 2023-09-06 NOTE — Assessment & Plan Note (Signed)
Last vitamin D Lab Results  Component Value Date   VD25OH 78.85 07/17/2023   Stable, cont oral replacement

## 2023-09-06 NOTE — Assessment & Plan Note (Signed)
Also for flexeril 5-10 at bedtime prn

## 2023-09-06 NOTE — Assessment & Plan Note (Signed)
Uncontrolled, ok for vesicare 5 every day as gemtessa too expansive

## 2023-09-10 ENCOUNTER — Telehealth: Payer: Self-pay | Admitting: Internal Medicine

## 2023-09-10 MED ORDER — MELOXICAM 7.5 MG PO TABS: 7.5000 mg | ORAL_TABLET | Freq: Every day | ORAL | 1 refills | Status: DC | PRN

## 2023-09-10 NOTE — Telephone Encounter (Signed)
Ok, as renal function has been stable, thanks

## 2023-09-10 NOTE — Telephone Encounter (Signed)
Patient wants to know if you can send in meloxicam for inflamation - was her on 10/29.    Pharmacy:  [facility] on [address]

## 2023-09-21 ENCOUNTER — Other Ambulatory Visit: Payer: Self-pay | Admitting: Internal Medicine

## 2023-09-23 ENCOUNTER — Other Ambulatory Visit: Payer: Self-pay

## 2023-09-25 ENCOUNTER — Other Ambulatory Visit: Payer: Self-pay | Admitting: Internal Medicine

## 2023-09-25 DIAGNOSIS — E1165 Type 2 diabetes mellitus with hyperglycemia: Secondary | ICD-10-CM

## 2023-09-25 NOTE — Telephone Encounter (Signed)
Metformin refill request complete

## 2023-10-07 ENCOUNTER — Encounter: Payer: Self-pay | Admitting: Emergency Medicine

## 2023-10-07 ENCOUNTER — Ambulatory Visit (INDEPENDENT_AMBULATORY_CARE_PROVIDER_SITE_OTHER): Payer: Medicare Other | Admitting: Emergency Medicine

## 2023-10-07 ENCOUNTER — Encounter (HOSPITAL_COMMUNITY): Payer: Self-pay

## 2023-10-07 ENCOUNTER — Other Ambulatory Visit: Payer: Self-pay

## 2023-10-07 ENCOUNTER — Inpatient Hospital Stay (HOSPITAL_COMMUNITY)
Admission: EM | Admit: 2023-10-07 | Discharge: 2023-10-09 | DRG: 641 | Disposition: A | Discharge status: Home / Self Care | Payer: Medicare Other | Source: Ambulatory Visit | Attending: Internal Medicine | Admitting: Internal Medicine

## 2023-10-07 VITALS — BP 112/64 | HR 68 | Temp 98.0°F | Ht 75.0 in | Wt 207.0 lb

## 2023-10-07 DIAGNOSIS — Z809 Family history of malignant neoplasm, unspecified: Secondary | ICD-10-CM | POA: Diagnosis not present

## 2023-10-07 DIAGNOSIS — Z1152 Encounter for screening for COVID-19: Secondary | ICD-10-CM

## 2023-10-07 DIAGNOSIS — R252 Cramp and spasm: Secondary | ICD-10-CM | POA: Diagnosis not present

## 2023-10-07 DIAGNOSIS — R1906 Epigastric swelling, mass or lump: Secondary | ICD-10-CM | POA: Diagnosis not present

## 2023-10-07 DIAGNOSIS — R109 Unspecified abdominal pain: Secondary | ICD-10-CM | POA: Insufficient documentation

## 2023-10-07 DIAGNOSIS — E039 Hypothyroidism, unspecified: Secondary | ICD-10-CM | POA: Diagnosis not present

## 2023-10-07 DIAGNOSIS — D352 Benign neoplasm of pituitary gland: Secondary | ICD-10-CM | POA: Diagnosis present

## 2023-10-07 DIAGNOSIS — M6282 Rhabdomyolysis: Secondary | ICD-10-CM | POA: Diagnosis present

## 2023-10-07 DIAGNOSIS — M791 Myalgia, unspecified site: Secondary | ICD-10-CM

## 2023-10-07 DIAGNOSIS — Z8249 Family history of ischemic heart disease and other diseases of the circulatory system: Secondary | ICD-10-CM

## 2023-10-07 DIAGNOSIS — Z833 Family history of diabetes mellitus: Secondary | ICD-10-CM | POA: Diagnosis not present

## 2023-10-07 DIAGNOSIS — E1169 Type 2 diabetes mellitus with other specified complication: Secondary | ICD-10-CM | POA: Diagnosis present

## 2023-10-07 DIAGNOSIS — Z79890 Hormone replacement therapy (postmenopausal): Secondary | ICD-10-CM

## 2023-10-07 DIAGNOSIS — E785 Hyperlipidemia, unspecified: Secondary | ICD-10-CM | POA: Diagnosis not present

## 2023-10-07 DIAGNOSIS — R630 Anorexia: Secondary | ICD-10-CM

## 2023-10-07 DIAGNOSIS — Z7982 Long term (current) use of aspirin: Secondary | ICD-10-CM

## 2023-10-07 DIAGNOSIS — Z888 Allergy status to other drugs, medicaments and biological substances status: Secondary | ICD-10-CM | POA: Diagnosis not present

## 2023-10-07 DIAGNOSIS — R110 Nausea: Secondary | ICD-10-CM

## 2023-10-07 DIAGNOSIS — Z91199 Patient's noncompliance with other medical treatment and regimen due to unspecified reason: Secondary | ICD-10-CM | POA: Diagnosis not present

## 2023-10-07 DIAGNOSIS — I10 Essential (primary) hypertension: Secondary | ICD-10-CM | POA: Diagnosis present

## 2023-10-07 DIAGNOSIS — E861 Hypovolemia: Secondary | ICD-10-CM | POA: Diagnosis not present

## 2023-10-07 DIAGNOSIS — E1122 Type 2 diabetes mellitus with diabetic chronic kidney disease: Secondary | ICD-10-CM

## 2023-10-07 DIAGNOSIS — R972 Elevated prostate specific antigen [PSA]: Secondary | ICD-10-CM | POA: Diagnosis not present

## 2023-10-07 DIAGNOSIS — D649 Anemia, unspecified: Secondary | ICD-10-CM | POA: Diagnosis present

## 2023-10-07 DIAGNOSIS — Z7952 Long term (current) use of systemic steroids: Secondary | ICD-10-CM | POA: Diagnosis not present

## 2023-10-07 DIAGNOSIS — R9431 Abnormal electrocardiogram [ECG] [EKG]: Secondary | ICD-10-CM | POA: Diagnosis not present

## 2023-10-07 DIAGNOSIS — R748 Abnormal levels of other serum enzymes: Secondary | ICD-10-CM | POA: Diagnosis present

## 2023-10-07 DIAGNOSIS — E871 Hypo-osmolality and hyponatremia: Principal | ICD-10-CM | POA: Diagnosis present

## 2023-10-07 DIAGNOSIS — E119 Type 2 diabetes mellitus without complications: Secondary | ICD-10-CM | POA: Diagnosis present

## 2023-10-07 DIAGNOSIS — Z79899 Other long term (current) drug therapy: Secondary | ICD-10-CM | POA: Diagnosis not present

## 2023-10-07 DIAGNOSIS — Z7984 Long term (current) use of oral hypoglycemic drugs: Secondary | ICD-10-CM | POA: Diagnosis not present

## 2023-10-07 LAB — COMPREHENSIVE METABOLIC PANEL
ALT: 28 U/L (ref 0–53)
AST: 38 U/L — ABNORMAL HIGH (ref 0–37)
Albumin: 4 g/dL (ref 3.5–5.2)
Alkaline Phosphatase: 64 U/L (ref 39–117)
BUN: 10 mg/dL (ref 6–23)
CO2: 26 meq/L (ref 19–32)
Calcium: 9.1 mg/dL (ref 8.4–10.5)
Chloride: 89 meq/L — ABNORMAL LOW (ref 96–112)
Creatinine, Ser: 1.06 mg/dL (ref 0.40–1.50)
GFR: 63.47 mL/min (ref >60.00)
Glucose, Bld: 172 mg/dL — ABNORMAL HIGH (ref 70–99)
Potassium: 4 meq/L (ref 3.5–5.1)
Sodium: 122 meq/L — ABNORMAL LOW (ref 135–145)
Total Bilirubin: 0.7 mg/dL (ref 0.2–1.2)
Total Protein: 6.8 g/dL (ref 6.0–8.3)

## 2023-10-07 LAB — URINALYSIS
Bilirubin Urine: NEGATIVE
Hgb urine dipstick: NEGATIVE
Leukocytes,Ua: NEGATIVE
Nitrite: NEGATIVE
Specific Gravity, Urine: 1.015 (ref 1.000–1.030)
Total Protein, Urine: NEGATIVE
Urine Glucose: NEGATIVE
Urobilinogen, UA: 0.2 (ref 0.0–1.0)
pH: 6 (ref 5.0–8.0)

## 2023-10-07 LAB — CBC
HCT: 36.8 % — ABNORMAL LOW (ref 39.0–52.0)
Hemoglobin: 12.7 g/dL — ABNORMAL LOW (ref 13.0–17.0)
MCH: 31.7 pg (ref 26.0–34.0)
MCHC: 34.5 g/dL (ref 30.0–36.0)
MCV: 91.8 fL (ref 80.0–100.0)
Platelets: 191 10*3/uL (ref 150–400)
RBC: 4.01 MIL/uL — ABNORMAL LOW (ref 4.22–5.81)
RDW: 12.6 % (ref 11.5–15.5)
WBC: 5.8 10*3/uL (ref 4.0–10.5)
nRBC: 0 % (ref 0.0–0.2)

## 2023-10-07 LAB — CBC WITH DIFFERENTIAL/PLATELET
Basophils Absolute: 0 10*3/uL (ref 0.0–0.1)
Basophils Relative: 0.5 % (ref 0.0–3.0)
Eosinophils Absolute: 0.3 10*3/uL (ref 0.0–0.7)
Eosinophils Relative: 3.8 % (ref 0.0–5.0)
HCT: 38.9 % — ABNORMAL LOW (ref 39.0–52.0)
Hemoglobin: 13.7 g/dL (ref 13.0–17.0)
Lymphocytes Relative: 22.3 % (ref 12.0–46.0)
Lymphs Abs: 1.7 10*3/uL (ref 0.7–4.0)
MCHC: 35.3 g/dL (ref 30.0–36.0)
MCV: 93 fL (ref 78.0–100.0)
Monocytes Absolute: 0.9 10*3/uL (ref 0.1–1.0)
Monocytes Relative: 12.3 % — ABNORMAL HIGH (ref 3.0–12.0)
Neutro Abs: 4.7 10*3/uL (ref 1.4–7.7)
Neutrophils Relative %: 61.1 % (ref 43.0–77.0)
Platelets: 233 10*3/uL (ref 150.0–400.0)
RBC: 4.19 Mil/uL — ABNORMAL LOW (ref 4.22–5.81)
RDW: 13.2 % (ref 11.5–15.5)
WBC: 7.6 10*3/uL (ref 4.0–10.5)

## 2023-10-07 LAB — MAGNESIUM: Magnesium: 1.4 mg/dL — ABNORMAL LOW (ref 1.5–2.5)

## 2023-10-07 LAB — BASIC METABOLIC PANEL
Anion gap: 6 (ref 5–15)
BUN: 12 mg/dL (ref 8–23)
CO2: 25 mmol/L (ref 22–32)
Calcium: 8.7 mg/dL — ABNORMAL LOW (ref 8.9–10.3)
Chloride: 90 mmol/L — ABNORMAL LOW (ref 98–111)
Creatinine, Ser: 1.06 mg/dL (ref 0.61–1.24)
GFR, Estimated: >60 mL/min (ref >60)
Glucose, Bld: 131 mg/dL — ABNORMAL HIGH (ref 70–99)
Potassium: 4.1 mmol/L (ref 3.5–5.1)
Sodium: 121 mmol/L — ABNORMAL LOW (ref 135–145)

## 2023-10-07 LAB — RESP PANEL BY RT-PCR (RSV, FLU A&B, COVID)  RVPGX2
Influenza A by PCR: NEGATIVE
Influenza B by PCR: NEGATIVE
Resp Syncytial Virus by PCR: NEGATIVE
SARS Coronavirus 2 by RT PCR: NEGATIVE

## 2023-10-07 LAB — GLUCOSE, CAPILLARY: Glucose-Capillary: 171 mg/dL — ABNORMAL HIGH (ref 70–99)

## 2023-10-07 LAB — CBG MONITORING, ED: Glucose-Capillary: 101 mg/dL — ABNORMAL HIGH (ref 70–99)

## 2023-10-07 LAB — CK: Total CK: 722 U/L — ABNORMAL HIGH (ref 7–232)

## 2023-10-07 LAB — TSH: TSH: 2.86 u[IU]/mL (ref 0.35–5.50)

## 2023-10-07 MED ORDER — FINASTERIDE 5 MG PO TABS
5.0000 mg | ORAL_TABLET | Freq: Every day | ORAL | Status: DC
  Administered 2023-10-08 – 2023-10-09 (×2): 5 mg via ORAL
  Filled 2023-10-07 (×2): qty 1

## 2023-10-07 MED ORDER — MELATONIN 3 MG PO TABS
3.0000 mg | ORAL_TABLET | Freq: Every evening | ORAL | Status: DC | PRN
  Administered 2023-10-07: 3 mg via ORAL
  Filled 2023-10-07: qty 1

## 2023-10-07 MED ORDER — ASPIRIN 81 MG PO TBEC
81.0000 mg | DELAYED_RELEASE_TABLET | Freq: Every day | ORAL | Status: DC
  Administered 2023-10-08 – 2023-10-09 (×2): 81 mg via ORAL
  Filled 2023-10-07 (×2): qty 1

## 2023-10-07 MED ORDER — FESOTERODINE FUMARATE ER 4 MG PO TB24
4.0000 mg | ORAL_TABLET | Freq: Every day | ORAL | Status: DC
  Administered 2023-10-08 – 2023-10-09 (×2): 4 mg via ORAL
  Filled 2023-10-07 (×2): qty 1

## 2023-10-07 MED ORDER — TAMSULOSIN HCL 0.4 MG PO CAPS
0.4000 mg | ORAL_CAPSULE | Freq: Every day | ORAL | Status: DC
  Administered 2023-10-08 – 2023-10-09 (×2): 0.4 mg via ORAL
  Filled 2023-10-07 (×2): qty 1

## 2023-10-07 MED ORDER — PANTOPRAZOLE SODIUM 40 MG PO TBEC
40.0000 mg | DELAYED_RELEASE_TABLET | Freq: Every day | ORAL | Status: DC
  Administered 2023-10-08 – 2023-10-09 (×2): 40 mg via ORAL
  Filled 2023-10-07 (×2): qty 1

## 2023-10-07 MED ORDER — ACETAMINOPHEN 650 MG RE SUPP: 650.0000 mg | Freq: Four times a day (QID) | RECTAL | Status: DC | PRN

## 2023-10-07 MED ORDER — SODIUM CHLORIDE 0.9 % IV BOLUS
1000.0000 mL | Freq: Once | INTRAVENOUS | Status: AC
  Administered 2023-10-07: 1000 mL via INTRAVENOUS

## 2023-10-07 MED ORDER — SODIUM CHLORIDE 0.9% FLUSH
3.0000 mL | Freq: Two times a day (BID) | INTRAVENOUS | Status: DC
  Administered 2023-10-07 – 2023-10-09 (×3): 3 mL via INTRAVENOUS

## 2023-10-07 MED ORDER — ENOXAPARIN SODIUM 40 MG/0.4ML IJ SOSY
40.0000 mg | PREFILLED_SYRINGE | INTRAMUSCULAR | Status: DC
  Administered 2023-10-07 – 2023-10-08 (×2): 40 mg via SUBCUTANEOUS
  Filled 2023-10-07 (×2): qty 0.4

## 2023-10-07 MED ORDER — MAGNESIUM SULFATE 2 GM/50ML IV SOLN
2.0000 g | Freq: Once | INTRAVENOUS | Status: AC
  Administered 2023-10-07: 2 g via INTRAVENOUS
  Filled 2023-10-07: qty 50

## 2023-10-07 MED ORDER — INSULIN ASPART 100 UNIT/ML IJ SOLN
0.0000 [IU] | Freq: Every day | INTRAMUSCULAR | Status: DC
  Filled 2023-10-07: qty 0.05

## 2023-10-07 MED ORDER — MUSCLE RUB 10-15 % EX CREA
1.0000 | TOPICAL_CREAM | CUTANEOUS | Status: DC | PRN
  Administered 2023-10-07 – 2023-10-08 (×2): 1 via TOPICAL
  Filled 2023-10-07: qty 85

## 2023-10-07 MED ORDER — SODIUM CHLORIDE 0.9 % IV SOLN: INTRAVENOUS | Status: AC

## 2023-10-07 MED ORDER — LEVOTHYROXINE SODIUM 50 MCG PO TABS
50.0000 ug | ORAL_TABLET | Freq: Every day | ORAL | Status: DC
Start: 2023-10-08 — End: 2023-10-09
  Administered 2023-10-08 – 2023-10-09 (×2): 50 ug via ORAL
  Filled 2023-10-07 (×2): qty 1

## 2023-10-07 MED ORDER — ACETAMINOPHEN 325 MG PO TABS: 650.0000 mg | ORAL_TABLET | Freq: Four times a day (QID) | ORAL | Status: DC | PRN

## 2023-10-07 MED ORDER — INSULIN ASPART 100 UNIT/ML IJ SOLN
0.0000 [IU] | Freq: Three times a day (TID) | INTRAMUSCULAR | Status: DC
  Filled 2023-10-07: qty 0.06

## 2023-10-07 MED ORDER — PROCHLORPERAZINE EDISYLATE 10 MG/2ML IJ SOLN: 5.0000 mg | Freq: Four times a day (QID) | INTRAMUSCULAR | Status: DC | PRN

## 2023-10-07 NOTE — Patient Instructions (Signed)
Health Maintenance After Age 86 After age 86, you are at a higher risk for certain long-term diseases and infections as well as injuries from falls. Falls are a major cause of broken bones and head injuries in people who are older than age 86. Getting regular preventive care can help to keep you healthy and well. Preventive care includes getting regular testing and making lifestyle changes as recommended by your health care provider. Talk with your health care provider about: Which screenings and tests you should have. A screening is a test that checks for a disease when you have no symptoms. A diet and exercise plan that is right for you. What should I know about screenings and tests to prevent falls? Screening and testing are the best ways to find a health problem early. Early diagnosis and treatment give you the best chance of managing medical conditions that are common after age 86. Certain conditions and lifestyle choices may make you more likely to have a fall. Your health care provider may recommend: Regular vision checks. Poor vision and conditions such as cataracts can make you more likely to have a fall. If you wear glasses, make sure to get your prescription updated if your vision changes. Medicine review. Work with your health care provider to regularly review all of the medicines you are taking, including over-the-counter medicines. Ask your health care provider about any side effects that may make you more likely to have a fall. Tell your health care provider if any medicines that you take make you feel dizzy or sleepy. Strength and balance checks. Your health care provider may recommend certain tests to check your strength and balance while standing, walking, or changing positions. Foot health exam. Foot pain and numbness, as well as not wearing proper footwear, can make you more likely to have a fall. Screenings, including: Osteoporosis screening. Osteoporosis is a condition that causes  the bones to get weaker and break more easily. Blood pressure screening. Blood pressure changes and medicines to control blood pressure can make you feel dizzy. Depression screening. You may be more likely to have a fall if you have a fear of falling, feel depressed, or feel unable to do activities that you used to do. Alcohol use screening. Using too much alcohol can affect your balance and may make you more likely to have a fall. Follow these instructions at home: Lifestyle Do not drink alcohol if: Your health care provider tells you not to drink. If you drink alcohol: Limit how much you have to: 0-1 drink a day for women. 0-2 drinks a day for men. Know how much alcohol is in your drink. In the [place] one drink equals one 12 oz bottle of beer (355 mL), one 5 oz glass of wine (148 mL), or one 1 oz glass of hard liquor (44 mL). Do not use any products that contain nicotine or tobacco. These products include cigarettes, chewing tobacco, and vaping devices, such as e-cigarettes. If you need help quitting, ask your health care provider. Activity  Follow a regular exercise program to stay fit. This will help you maintain your balance. Ask your health care provider what types of exercise are appropriate for you. If you need a cane or walker, use it as recommended by your health care provider. Wear supportive shoes that have nonskid soles. Safety  Remove any tripping hazards, such as rugs, cords, and clutter. Install safety equipment such as grab bars in bathrooms and safety rails on stairs. Keep rooms and walkways   well-lit. General instructions Talk with your health care provider about your risks for falling. Tell your health care provider if: You fall. Be sure to tell your health care provider about all falls, even ones that seem minor. You feel dizzy, tiredness (fatigue), or off-balance. Take over-the-counter and prescription medicines only as told by your health care provider. These include  supplements. Eat a healthy diet and maintain a healthy weight. A healthy diet includes low-fat dairy products, low-fat (lean) meats, and fiber from whole grains, beans, and lots of fruits and vegetables. Stay current with your vaccines. Schedule regular health, dental, and eye exams. Summary Having a healthy lifestyle and getting preventive care can help to protect your health and wellness after age 86. Screening and testing are the best way to find a health problem early and help you avoid having a fall. Early diagnosis and treatment give you the best chance for managing medical conditions that are more common for people who are older than age 86. Falls are a major cause of broken bones and head injuries in people who are older than age 86. Take precautions to prevent a fall at home. Work with your health care provider to learn what changes you can make to improve your health and wellness and to prevent falls. This information is not intended to replace advice given to you by your health care provider. Make sure you discuss any questions you have with your health care provider. Document Revised: 03/13/2021 Document Reviewed: 03/13/2021 Elsevier Patient Education  2024 Elsevier Inc.  

## 2023-10-07 NOTE — Assessment & Plan Note (Signed)
Symptomatic and dangerously low.  At risk for seizures. Recommend evaluation and treatment in the emergency department today.

## 2023-10-07 NOTE — Assessment & Plan Note (Signed)
Rhabdomyolysis with diffuse cramping At risk for acute kidney failure Recommend IV hydration Advised to go to hospital emergency department tonight

## 2023-10-07 NOTE — H&P (Addendum)
History and Physical    Joseph Rocha WUJ:[id number] DOB: [birth year] DOA: 10/07/2023  PCP: [name], MD   Patient coming from: Home   Chief Complaint: Poor appetite, nausea, muscle cramps   HPI: Joseph Rocha is a 88 y.o. male with medical history significant for type 2 diabetes mellitus, hypothyroidism, hyperlipidemia, and remote history of pituitary tumor resection now presenting with muscle cramps, loss of appetite, nausea, and abnormal blood work.   Patient reports 6 days of poor appetite and muscle cramps.  He has also developed nausea and had an episode of nonbloody vomiting yesterday evening.  He denies abdominal pain, fever, chills, chest pain, or diarrhea.  He does not drink beer.  He has not been drinking excessive amounts of water or other fluids.  He has been taking ODT Zofran with improvement in his nausea.  He has not had any other new medicines recently.  ED Course: Upon arrival to the ED, patient is found to be afebrile and saturating well on room air with normal heart rate and stable blood pressure.  Labs are most notable for sodium 121, magnesium 1.4, normal TSH, and CK7 122.  Urinalysis notable for trace ketones but negative hemoglobin on dipstick.  Patient was given a liter of normal saline in the ED.  Review of Systems:  All other systems reviewed and apart from HPI, are negative.  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    Past Surgical History:  Procedure Laterality Date   DOPPLER ECHOCARDIOGRAPHY  12/04/2001   ELECTROCARDIOGRAM  03/25/2006   EP IMPLANTABLE DEVICE N/A 03/16/2016   Procedure: Loop Recorder Insertion;  Surgeon: Duke Salvia, MD;  Location: [facility];  Service: Cardiovascular;  Laterality: N/A;   KNEE  ARTHROSCOPY Left    LUMBAR DISC SURGERY     MENISCUS REPAIR Right may 2015   TONSILLECTOMY     TRANSPHENOIDAL / TRANSNASAL HYPOPHYSECTOMY / RESECTION PITUITARY TUMOR  2005    Social History:   reports that he has never smoked. He has never used smokeless tobacco. He reports that he does not drink alcohol and does not use drugs.  Allergies  Allergen Reactions   Lipitor [Atorvastatin] Other (See Comments)    Myalgias, cramps in hand    Family History  Problem Relation Age of Onset   Diabetes Mellitus II Mother        Deceased, 33s   Heart attack Father    Cancer Brother        uncertain type   Healthy Daughter    Colon cancer Neg Hx    Esophageal cancer Neg Hx    Liver cancer Neg Hx    Pancreatic cancer Neg Hx    Stomach cancer Neg Hx      Prior to Admission medications   Medication Sig Start Date End Date Taking? Authorizing Provider  albuterol (VENTOLIN HFA) 108 (90 Base) MCG/ACT inhaler Inhale 2 puffs into the lungs every 6 (six) hours as needed for wheezing or shortness of breath. 10/03/22   [name], MD  aspirin EC 81 MG tablet Take 1 tablet (81 mg total) by mouth daily. Swallow whole. 07/13/20   [name], MD  Azelastine HCl  137 MCG/SPRAY SOLN  07/26/22   [provider]  calcium carbonate (OS-CAL) 600 MG TABS Take 600 mg by mouth daily.    [provider]  cetirizine-pseudoephedrine (ZYRTEC-D) 5-120 MG tablet Take 1 tablet by mouth daily. 05/09/17   Romero Belling, MD  ciprofloxacin-dexamethasone [facility]) OTIC suspension Place 4 drops into the left ear 2 (two) times daily. 07/10/22   Henson, Vickie L, NP-C  cyclobenzaprine (FLEXERIL) 5 MG tablet Take 1 tablet (5 mg total) by mouth 3 (three) times daily as needed. 09/02/23   [name], MD  diphenoxylate-atropine (LOMOTIL) 2.5-0.025 MG tablet Take 1 tablet by mouth 4 (four) times daily as needed for diarrhea or loose stools. 12/21/21   [name], MD  ferrous sulfate 325 (65 FE) MG EC tablet  Take by mouth. 07/26/22   [provider]  finasteride (PROSCAR) 5 MG tablet TAKE 1 TABLET DAILY 04/04/21   [name], MD  fludrocortisone (FLORINEF) 0.1 MG tablet TAKE 1 TABLET DAILY 02/06/23   [name], MD  fluticasone [facility]) 50 MCG/ACT nasal spray Place 2 sprays into both nostrils daily. 10/15/18   Myrlene Broker, MD  folic acid (FOLVITE) 1 MG tablet TAKE 1 TABLET DAILY 04/23/23   [name], MD  guaiFENesin (MUCINEX) 600 MG 12 hr tablet Take 2 tablets (1,200 mg total) by mouth 2 (two) times daily as needed. 10/03/22   [name], MD  IRON PO Take by mouth.    [provider]  KLOR-CON M20 20 MEQ tablet Take 20 mEq by mouth daily.  03/15/13   [provider]  levothyroxine (SYNTHROID) 50 MCG tablet TAKE 1 TABLET DAILY 04/17/23   [name], MD  loperamide (IMODIUM A-D) 2 MG tablet Take 1 tablet (2 mg total) by mouth 2 (two) times daily as needed for diarrhea or loose stools. 03/25/23   Ernie Avena, MD  meloxicam (MOBIC) 7.5 MG tablet Take 1 tablet (7.5 mg total) by mouth daily as needed for pain. 09/10/23   [name], MD  metFORMIN (GLUCOPHAGE) 500 MG tablet TAKE 1 TABLET TWICE DAILY  WITH MEALS 09/25/23   Carlus Pavlov, MD  Multiple Vitamins-Minerals (CENTRUM SILVER PO) Take 1 tablet by mouth daily.    [provider]  Omega-3 Fatty Acids (FISH OIL) 1000 MG CAPS Take 1 capsule by mouth daily.    [provider]  omeprazole (PRILOSEC) 40 MG capsule TAKE 1 CAPSULE DAILY 04/17/23   [name], MD  ondansetron (ZOFRAN-ODT) 4 MG disintegrating tablet Take 1 tablet (4 mg total) by mouth every 8 (eight) hours as needed for nausea or vomiting. 05/28/23   [name], MD  predniSONE (DELTASONE) 10 MG tablet 2 tabs by mouth per day for 5 days 03/29/23   [name], MD  promethazine-dextromethorphan (PROMETHAZINE-DM) 6.25-15 MG/5ML syrup Take 5 mLs by mouth 2 (two) times daily as needed. 09/25/22   Myrlene Broker, MD   repaglinide (PRANDIN) 2 MG tablet TAKE 1 TABLET TWICE DAILY  BEFORE MEALS 08/26/23   [name], MD  rosuvastatin (CRESTOR) 5 MG tablet TAKE 1 TABLET DAILY 09/23/23   [name], MD  solifenacin (VESICARE) 5 MG tablet Take 1 tablet (5 mg total) by mouth daily. 09/02/23   [name], MD  Tamsulosin HCl (FLOMAX) 0.4 MG CAPS Take 0.4 mg by mouth daily.    [provider]  Testosterone 30 MG/ACT SOLN Place 2 Pump onto the skin daily.  [provider]  traMADol (ULTRAM) 50 MG tablet Take 1 tablet (50 mg total) by mouth every 6 (six) hours as needed. 05/07/18   Romero Belling, MD  traMADol (ULTRAM) 50 MG tablet Take 1 tablet (50 mg total) by mouth every 6 (six) hours as needed. 07/17/23   [name], MD  triamcinolone (NASACORT) 55 MCG/ACT AERO nasal inhaler Place 2 sprays into the nose daily. 07/13/20   [name], MD    Physical Exam: Vitals:   10/07/23 1745 10/07/23 2104  BP: 120/79 133/80  Pulse: 79 67  Resp: 16 14  Temp: 98.3 F (36.8 C) 97.8 F (36.6 C)  TempSrc: Oral Oral  SpO2: 99% 90%    Constitutional: NAD, calm  Eyes: PERTLA, lids and conjunctivae normal ENMT: Mucous membranes are moist. Posterior pharynx clear of any exudate or lesions.   Neck: supple, no masses  Respiratory: no wheezing, no crackles. No accessory muscle use.  Cardiovascular: S1 & S2 heard, regular rate and rhythm. Mild ankle edema.   Abdomen: No distension, no tenderness, soft. Bowel sounds active.  Musculoskeletal: no clubbing / cyanosis. No joint deformity upper and lower extremities.   Skin: no significant rashes, lesions, ulcers. Warm, dry, well-perfused. Neurologic: CN 2-12 grossly intact. Moving all extremities. Alert and oriented.  Psychiatric: Pleasant. Cooperative.    Labs and Imaging on Admission: I have personally reviewed following labs and imaging studies  CBC: Recent Labs  Lab 10/07/23 1400 10/07/23 2038  WBC 7.6 5.8  NEUTROABS 4.7  --   HGB 13.7 12.7*   HCT 38.9* 36.8*  MCV 93.0 91.8  PLT 233.0 191   Basic Metabolic Panel: Recent Labs  Lab 10/07/23 1400 10/07/23 2038  NA 122* 121*  K 4.0 4.1  CL 89* 90*  CO2 26 25  GLUCOSE 172* 131*  BUN 10 12  CREATININE 1.06 1.06  CALCIUM 9.1 8.7*  MG 1.4*  --    GFR: Estimated Creatinine Clearance: 59.8 mL/min (by C-G formula based on SCr of 1.06 mg/dL). Liver Function Tests: Recent Labs  Lab 10/07/23 1400  AST 38*  ALT 28  ALKPHOS 64  BILITOT 0.7  PROT 6.8  ALBUMIN 4.0   No results for input(s): "LIPASE", "AMYLASE" in the last 168 hours. No results for input(s): "AMMONIA" in the last 168 hours. Coagulation Profile: No results for input(s): "INR", "PROTIME" in the last 168 hours. Cardiac Enzymes: Recent Labs  Lab 10/07/23 1400  CKTOTAL 722*   BNP (last 3 results) No results for input(s): "PROBNP" in the last 8760 hours. HbA1C: No results for input(s): "HGBA1C" in the last 72 hours. CBG: Recent Labs  Lab 10/07/23 2049  GLUCAP 101*   Lipid Profile: No results for input(s): "CHOL", "HDL", "LDLCALC", "TRIG", "CHOLHDL", "LDLDIRECT" in the last 72 hours. Thyroid Function Tests: Recent Labs    10/07/23 1400  TSH 2.86   Anemia Panel: No results for input(s): "VITAMINB12", "FOLATE", "FERRITIN", "TIBC", "IRON", "RETICCTPCT" in the last 72 hours. Urine analysis:    Component Value Date/Time   COLORURINE YELLOW 10/07/2023 1400   APPEARANCEUR CLEAR 10/07/2023 1400   LABSPEC 1.015 10/07/2023 1400   PHURINE 6.0 10/07/2023 1400   GLUCOSEU NEGATIVE 10/07/2023 1400   HGBUR NEGATIVE 10/07/2023 1400   BILIRUBINUR NEGATIVE 10/07/2023 1400   KETONESUR TRACE (A) 10/07/2023 1400   PROTEINUR NEGATIVE 07/31/2022 1639   UROBILINOGEN 0.2 10/07/2023 1400   NITRITE NEGATIVE 10/07/2023 1400   LEUKOCYTESUR NEGATIVE 10/07/2023 1400   Sepsis Labs: @LABRCNTIP (procalcitonin:4,lacticidven:4) )No results found for this  or any previous visit (from the past 240 hour(s)).   Radiological  Exams on Admission: No results found.   Assessment/Plan   1. Hyponatremia  - Serum sodium 121; he has nausea and malaise but no severe symptoms  - He appears hypovolemic and received 1 liter NS in ED  - TSH was normal today  - Check urine sodium and urine osm (note that he already received 1 liter NS), follow serial sodium levels, may need AM cortisol and ACTH stim test depending on urine studies   2. Nausea  - Abdominal exam benign, no fever or diarrhea    - Likely d/t hyponatremia, will continue supportive care   3. Elevated CK  - Serum CK is 722  - Renal function is preserved and there is no "Hgb" on UA  - Hold Crestor for now, continue IVF hydration   4. Hypomagnesemia  - Replacing    5. Type II DM  - A1c was 7.3% in September 2024  - Check CBGs and use low-intensity SSI   6. Hypothyroidism  - TSH normal today  - Continue Synthroid    7. Muscle cramps  - Correct hypomagnesemia, hold Crestor for now    DVT prophylaxis: Lovenox  Code Status: Full  Level of Care: Level of care: Progressive Family Communication: Wife at bedside   Disposition Plan:  Patient is from: Home  Anticipated d/c is to: TBD Anticipated d/c date is: 10/09/23  Patient currently: Pending improved electrolytes  Consults called: None  Admission status: Inpatient     Briscoe Deutscher, MD [facility]  10/07/2023, 9:29 PM

## 2023-10-07 NOTE — Assessment & Plan Note (Signed)
Clinically stable.  Differential diagnosis discussed. Advised to stay well-hydrated with Gatorade or Pedialyte. Electrolyte imbalance a possibility Blood work done today

## 2023-10-07 NOTE — Progress Notes (Addendum)
Joseph Rocha 88 y.o.   Chief Complaint  Patient presents with   Back Pain    Nausea started the day of Thanksgiving. Patient states feeling full and threw up food last night. Pt states having low temp. Yesterday evening. No other symptoms   Knee Pain    Patient states he's been in pain since the day before Thanksgiving. Cramps in his legs and feets     HISTORY OF PRESENT ILLNESS: Acute problem visit today.  Patient of Dr. Oliver Barre This is a 88 y.o. male complaining of cramps to extremities that started about a week ago along with intermittent back pain. Also complaining of no appetite, feeling full, low temperature for at least a week Felt nauseous today.  Took Zofran with relief. No other associated symptoms No other complaints or medical concerns today.  Back Pain Associated symptoms include abdominal pain and weight loss. Pertinent negatives include no chest pain, dysuria, fever or headaches.  Knee Pain      Prior to Admission medications   Medication Sig Start Date End Date Taking? Authorizing Provider  albuterol (VENTOLIN HFA) 108 (90 Base) MCG/ACT inhaler Inhale 2 puffs into the lungs every 6 (six) hours as needed for wheezing or shortness of breath. 10/03/22  Yes [name], MD  aspirin EC 81 MG tablet Take 1 tablet (81 mg total) by mouth daily. Swallow whole. 07/13/20  Yes [name], MD  Azelastine HCl 137 MCG/SPRAY SOLN  07/26/22  Yes [provider]  calcium carbonate (OS-CAL) 600 MG TABS Take 600 mg by mouth daily.   Yes [provider]  cetirizine-pseudoephedrine (ZYRTEC-D) 5-120 MG tablet Take 1 tablet by mouth daily. 05/09/17  Yes Romero Belling, MD  ciprofloxacin-dexamethasone [facility]) OTIC suspension Place 4 drops into the left ear 2 (two) times daily. 07/10/22  Yes Henson, Vickie L, NP-C  cyclobenzaprine (FLEXERIL) 5 MG tablet Take 1 tablet (5 mg total) by mouth 3 (three) times daily as needed. 09/02/23  Yes [name], MD   diphenoxylate-atropine (LOMOTIL) 2.5-0.025 MG tablet Take 1 tablet by mouth 4 (four) times daily as needed for diarrhea or loose stools. 12/21/21  Yes [name], MD  ferrous sulfate 325 (65 FE) MG EC tablet Take by mouth. 07/26/22  Yes [provider]  finasteride (PROSCAR) 5 MG tablet TAKE 1 TABLET DAILY 04/04/21  Yes [name], MD  fludrocortisone (FLORINEF) 0.1 MG tablet TAKE 1 TABLET DAILY 02/06/23  Yes [name], MD  fluticasone [facility]) 50 MCG/ACT nasal spray Place 2 sprays into both nostrils daily. 10/15/18  Yes Myrlene Broker, MD  folic acid (FOLVITE) 1 MG tablet TAKE 1 TABLET DAILY 04/23/23  Yes [name], MD  guaiFENesin (MUCINEX) 600 MG 12 hr tablet Take 2 tablets (1,200 mg total) by mouth 2 (two) times daily as needed. 10/03/22  Yes [name], MD  IRON PO Take by mouth.   Yes [provider]  KLOR-CON M20 20 MEQ tablet Take 20 mEq by mouth daily.  03/15/13  Yes [provider]  levothyroxine (SYNTHROID) 50 MCG tablet TAKE 1 TABLET DAILY 04/17/23  Yes [name], MD  loperamide (IMODIUM A-D) 2 MG tablet Take 1 tablet (2 mg total) by mouth 2 (two) times daily as needed for diarrhea or loose stools. 03/25/23  Yes Ernie Avena, MD  meloxicam (MOBIC) 7.5 MG tablet Take 1 tablet (7.5 mg total) by mouth daily as needed for pain. 09/10/23  Yes [name],  MD  metFORMIN (GLUCOPHAGE) 500 MG tablet TAKE 1 TABLET TWICE DAILY  WITH MEALS 09/25/23  Yes Carlus Pavlov, MD  Multiple Vitamins-Minerals (CENTRUM SILVER PO) Take 1 tablet by mouth daily.   Yes [provider]  Omega-3 Fatty Acids (FISH OIL) 1000 MG CAPS Take 1 capsule by mouth daily.   Yes [provider]  omeprazole (PRILOSEC) 40 MG capsule TAKE 1 CAPSULE DAILY 04/17/23  Yes [name], MD  ondansetron (ZOFRAN-ODT) 4 MG disintegrating tablet Take 1 tablet (4 mg total) by mouth every 8 (eight) hours as needed for nausea or vomiting. 05/28/23  Yes [name], MD   predniSONE (DELTASONE) 10 MG tablet 2 tabs by mouth per day for 5 days 03/29/23  Yes [name], MD  promethazine-dextromethorphan (PROMETHAZINE-DM) 6.25-15 MG/5ML syrup Take 5 mLs by mouth 2 (two) times daily as needed. 09/25/22  Yes Myrlene Broker, MD  repaglinide (PRANDIN) 2 MG tablet TAKE 1 TABLET TWICE DAILY  BEFORE MEALS 08/26/23  Yes [name], MD  rosuvastatin (CRESTOR) 5 MG tablet TAKE 1 TABLET DAILY 09/23/23  Yes [name], MD  solifenacin (VESICARE) 5 MG tablet Take 1 tablet (5 mg total) by mouth daily. 09/02/23  Yes [name], MD  Tamsulosin HCl (FLOMAX) 0.4 MG CAPS Take 0.4 mg by mouth daily.   Yes [provider]  Testosterone 30 MG/ACT SOLN Place 2 Pump onto the skin daily.   Yes [provider]  traMADol (ULTRAM) 50 MG tablet Take 1 tablet (50 mg total) by mouth every 6 (six) hours as needed. 05/07/18  Yes Romero Belling, MD  traMADol (ULTRAM) 50 MG tablet Take 1 tablet (50 mg total) by mouth every 6 (six) hours as needed. 07/17/23  Yes [name], MD  triamcinolone (NASACORT) 55 MCG/ACT AERO nasal inhaler Place 2 sprays into the nose daily. 07/13/20  Yes [name], MD    Allergies  Allergen Reactions   Lipitor [Atorvastatin] Other (See Comments)    Myalgias, cramps in hand    Patient Active Problem List   Diagnosis Date Noted   OAB (overactive bladder) 09/06/2023   Right knee pain 07/17/2023   Nocturnal leg cramps 07/17/2023   Primary osteoarthritis of right knee 07/17/2023   COVID-19 virus infection 05/28/2023   Nausea & vomiting 03/29/2023   Hypokalemia 03/29/2023   Acute upper respiratory infection 03/29/2023   Wheezing 10/03/2022   Hyperlipidemia associated with type 2 diabetes mellitus (HCC) 09/13/2022   Thoracic back pain 08/21/2022   Posterior neck pain 08/21/2022   Bilateral impacted cerumen 07/26/2022   Acute sinus infection 12/21/2021   Nausea 12/21/2021   Vertigo 03/24/2021   Vitamin D deficiency 01/16/2021    Acute ear pain, bilateral 12/31/2020   TMJ arthralgia 02/09/2020   Sinus bradycardia 12/08/2019   Status post placement of implantable loop recorder 12/08/2019   Cough 10/13/2018   CKD (chronic kidney disease) stage 3, GFR 30-59 ml/min (HCC) 08/01/2018   Rash 05/27/2018   Hyponatremia 05/27/2018   Diarrhea 02/18/2018   Atrial fibrillation with RVR (HCC) 01/05/2018   Hypomagnesemia 01/05/2018   Constipation 12/05/2017   CAP (community acquired pneumonia) 10/02/2017   Hypocalcemia 04/29/2017   Low back pain 11/16/2016   Edema 10/03/2016   Dyspnea 10/03/2016   Hypothyroidism 07/26/2016   Chest wall pain 04/06/2016   Numbness 04/06/2016   Contusion of right foot 12/12/2015   Hypotension 11/10/2014   Weight loss 07/07/2014   Sick sinus syndrome (HCC) 03/02/2014  Nausea with vomiting 03/25/2013   Bilateral leg weakness 07/21/2012   Encounter for long-term (current) use of other medications 02/13/2012   Pituitary abnormality (HCC) 02/23/2011   Lymphadenitis, acute 02/23/2011   BRADYCARDIA 10/17/2009   DIZZINESS 10/17/2009   DISC DISEASE, CERVICAL 12/28/2008   SINUSITIS- ACUTE-NOS 11/22/2008   Hypogonadism male 09/01/2008   Inflammatory and toxic neuropathy (HCC) 09/01/2008   FOOT DROP, RIGHT 09/01/2008   PSA, INCREASED 09/01/2008   PITUITARY MACROADENOMA 05/06/2008   SYNCOPE 05/06/2008   Diabetes (HCC) 05/14/2007   Anemia 05/14/2007   Allergic rhinitis 05/14/2007   Diverticulosis of colon 05/14/2007   Osteoarthritis 05/14/2007    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    Past Surgical History:  Procedure Laterality Date   DOPPLER ECHOCARDIOGRAPHY  12/04/2001   ELECTROCARDIOGRAM  03/25/2006   EP IMPLANTABLE DEVICE N/A 03/16/2016   Procedure:  Loop Recorder Insertion;  Surgeon: Duke Salvia, MD;  Location: [facility];  Service: Cardiovascular;  Laterality: N/A;   KNEE ARTHROSCOPY Left    LUMBAR DISC SURGERY     MENISCUS REPAIR Right may 2015   TONSILLECTOMY     TRANSPHENOIDAL / TRANSNASAL HYPOPHYSECTOMY / RESECTION PITUITARY TUMOR  2005    Social History   Socioeconomic History   Marital status: Married    Spouse name: Not on file   Number of children: 2   Years of education: Not on file   Highest education level: Not on file  Occupational History   Occupation: retired    Associate Professor: RETIRED  Tobacco Use   Smoking status: Never   Smokeless tobacco: Never  Vaping Use   Vaping status: Never Used  Substance and Sexual Activity   Alcohol use: No   Drug use: No   Sexual activity: Not Currently  Other Topics Concern   Not on file  Social History Narrative   Lives with wife.  They have two grown children.   He is retired from the [employer].   Social Determinants of Health   Financial Resource Strain: Low Risk  (06/08/2022)   Overall Financial Resource Strain (CARDIA)    Difficulty of Paying Living Expenses: Not hard at all  Food Insecurity: No Food Insecurity (06/08/2022)   Hunger Vital Sign    Worried About Running Out of Food in the Last Year: Never true    Ran Out of Food in the Last Year: Never true  Transportation Needs: No Transportation Needs (06/08/2022)   PRAPARE - Administrator, Civil Service (Medical): No    Lack of Transportation (Non-Medical): No  Physical Activity: Inactive (06/08/2022)   Exercise Vital Sign    Days of Exercise per Week: 0 days    Minutes of Exercise per Session: 0 min  Stress: No Stress Concern Present (06/08/2022)   [employer] of Occupational Health - Occupational Stress Questionnaire    Feeling of Stress : Not at all  Social Connections: Socially Integrated (06/08/2022)   Social Connection and Isolation Panel [NHANES]    Frequency of Communication with  Friends and Family: Three times a week    Frequency of Social Gatherings with Friends and Family: Three times a week    Attends Religious Services: More  than 4 times per year    Active Member of Clubs or Organizations: Yes    Attends Banker Meetings: More than 4 times per year    Marital Status: Married  Catering manager Violence: Not At Risk (06/08/2022)   Humiliation, Afraid, Rape, and Kick questionnaire    Fear of Current or Ex-Partner: No    Emotionally Abused: No    Physically Abused: No    Sexually Abused: No    Family History  Problem Relation Age of Onset   Diabetes Mellitus II Mother        Deceased, 20s   Heart attack Father    Cancer Brother        uncertain type   Healthy Daughter    Colon cancer Neg Hx    Esophageal cancer Neg Hx    Liver cancer Neg Hx    Pancreatic cancer Neg Hx    Stomach cancer Neg Hx      Review of Systems  Constitutional:  Positive for weight loss. Negative for chills and fever.  HENT: Negative.  Negative for congestion and sore throat.   Respiratory: Negative.  Negative for cough and shortness of breath.   Cardiovascular: Negative.  Negative for chest pain and palpitations.  Gastrointestinal:  Positive for abdominal pain and nausea. Negative for blood in stool and vomiting.  Genitourinary: Negative.  Negative for dysuria and hematuria.  Musculoskeletal:  Positive for back pain.  Skin: Negative.  Negative for rash.  Neurological: Negative.  Negative for dizziness and headaches.  All other systems reviewed and are negative.   Vitals:   10/07/23 1315  BP: 112/64  Pulse: 68  Temp: 98 F (36.7 C)  SpO2: 96%    Physical Exam Vitals reviewed.  Constitutional:      Appearance: Normal appearance.  HENT:     Head: Normocephalic.     Mouth/Throat:     Mouth: Mucous membranes are moist.     Pharynx: Oropharynx is clear.  Eyes:     Extraocular Movements: Extraocular movements intact.     Conjunctiva/sclera: Conjunctivae  normal.     Pupils: Pupils are equal, round, and reactive to light.  Cardiovascular:     Rate and Rhythm: Normal rate and regular rhythm.     Pulses: Normal pulses.     Heart sounds: Normal heart sounds.  Pulmonary:     Effort: Pulmonary effort is normal.     Breath sounds: Normal breath sounds.  Abdominal:     Palpations: Abdomen is soft.     Tenderness: There is no abdominal tenderness.  Musculoskeletal:     Cervical back: No tenderness.  Lymphadenopathy:     Cervical: No cervical adenopathy.  Skin:    General: Skin is warm and dry.     Capillary Refill: Capillary refill takes less than 2 seconds.  Neurological:     General: No focal deficit present.     Mental Status: He is alert and oriented to person, place, and time.  Psychiatric:        Mood and Affect: Mood normal.        Behavior: Behavior normal.    Results for orders placed or performed in visit on 10/07/23 (from the past 24 hour(s))  CBC with Differential/Platelet     Status: Abnormal   Collection Time: 10/07/23  2:00 PM  Result Value Ref Range   WBC 7.6 4.0 - 10.5 K/uL   RBC 4.19 (L) 4.22 - 5.81 Mil/uL   Hemoglobin 13.7 13.0 -  17.0 g/dL   HCT 84.1 (L) 66.0 - 63.0 %   MCV 93.0 78.0 - 100.0 fl   MCHC 35.3 30.0 - 36.0 g/dL   RDW 16.0 10.9 - 32.3 %   Platelets 233.0 150.0 - 400.0 K/uL   Neutrophils Relative % 61.1 43.0 - 77.0 %   Lymphocytes Relative 22.3 12.0 - 46.0 %   Monocytes Relative 12.3 (H) 3.0 - 12.0 %   Eosinophils Relative 3.8 0.0 - 5.0 %   Basophils Relative 0.5 0.0 - 3.0 %   Neutro Abs 4.7 1.4 - 7.7 K/uL   Lymphs Abs 1.7 0.7 - 4.0 K/uL   Monocytes Absolute 0.9 0.1 - 1.0 K/uL   Eosinophils Absolute 0.3 0.0 - 0.7 K/uL   Basophils Absolute 0.0 0.0 - 0.1 K/uL  Comprehensive metabolic panel     Status: Abnormal   Collection Time: 10/07/23  2:00 PM  Result Value Ref Range   Sodium 122 (L) 135 - 145 mEq/L   Potassium 4.0 3.5 - 5.1 mEq/L   Chloride 89 (L) 96 - 112 mEq/L   CO2 26 19 - 32 mEq/L    Glucose, Bld 172 (H) 70 - 99 mg/dL   BUN 10 6 - 23 mg/dL   Creatinine, Ser 5.57 0.40 - 1.50 mg/dL   Total Bilirubin 0.7 0.2 - 1.2 mg/dL   Alkaline Phosphatase 64 39 - 117 U/L   AST 38 (H) 0 - 37 U/L   ALT 28 0 - 53 U/L   Total Protein 6.8 6.0 - 8.3 g/dL   Albumin 4.0 3.5 - 5.2 g/dL   GFR 32.20 >25.42 mL/min   Calcium 9.1 8.4 - 10.5 mg/dL  TSH     Status: None   Collection Time: 10/07/23  2:00 PM  Result Value Ref Range   TSH 2.86 0.35 - 5.50 uIU/mL  Magnesium     Status: Abnormal   Collection Time: 10/07/23  2:00 PM  Result Value Ref Range   Magnesium 1.4 (L) 1.5 - 2.5 mg/dL  Urinalysis     Status: Abnormal   Collection Time: 10/07/23  2:00 PM  Result Value Ref Range   Color, Urine YELLOW Yellow;Lt. Yellow;Straw;Dark Yellow;Amber;Green;Red;Brown   APPearance CLEAR Clear;Turbid;Slightly Cloudy;Cloudy   Specific Gravity, Urine 1.015 1.000 - 1.030   pH 6.0 5.0 - 8.0   Total Protein, Urine NEGATIVE Negative   Urine Glucose NEGATIVE Negative   Ketones, ur TRACE (A) Negative   Bilirubin Urine NEGATIVE Negative   Hgb urine dipstick NEGATIVE Negative   Urobilinogen, UA 0.2 0.0 - 1.0   Leukocytes,Ua NEGATIVE Negative   Nitrite NEGATIVE Negative  CK     Status: Abnormal   Collection Time: 10/07/23  2:00 PM  Result Value Ref Range   Total CK 722 (H) 7 - 232 U/L      ASSESSMENT & PLAN: A total of 43 minutes was spent with the patient and counseling/coordination of care regarding preparing for this visit, review of most recent office visit notes, review of multiple chronic medical conditions and their management, differential diagnosis of extremity cramping, differential diagnosis of epigastric fullness and need for GI evaluation and possible upper endoscopy, review of all medications, review of most recent bloodwork results, review of health maintenance items, education on nutrition, prognosis, documentation, and need for follow up. .  Problem List Items Addressed This Visit        Endocrine   Hypothyroidism    Clinically euthyroid. Continue Synthroid 50 mcg daily TSH done today  Relevant Orders   CK (Completed)   CBC with Differential/Platelet (Completed)   Comprehensive metabolic panel (Completed)   TSH (Completed)   Magnesium (Completed)   Urinalysis (Completed)     Other   Hyponatremia    Symptomatic and dangerously low.  At risk for seizures. Recommend evaluation and treatment in the emergency department today.      Epigastric fullness    With loss of appetite Fairly recent onset Needs GI evaluation for possible upper endoscopy Malignancy needs to be excluded      Relevant Orders   CK (Completed)   CBC with Differential/Platelet (Completed)   Comprehensive metabolic panel (Completed)   TSH (Completed)   Magnesium (Completed)   Ambulatory referral to Gastroenterology   Urinalysis (Completed)   Lack of appetite    Of recent onset.  Recommend blood work today Recent chest x-ray normal. Urinalysis done today. Recommend GI evaluation for upper endoscopy      Relevant Orders   CK (Completed)   CBC with Differential/Platelet (Completed)   Comprehensive metabolic panel (Completed)   TSH (Completed)   Magnesium (Completed)   Ambulatory referral to Gastroenterology   Urinalysis (Completed)   Cramps, extremity - Primary    Clinically stable.  Differential diagnosis discussed. Advised to stay well-hydrated with Gatorade or Pedialyte. Electrolyte imbalance a possibility Blood work done today      Relevant Orders   CK (Completed)   CBC with Differential/Platelet (Completed)   Comprehensive metabolic panel (Completed)   TSH (Completed)   Magnesium (Completed)   Urinalysis (Completed)   Nausea without vomiting    Zofran ODT helps. Recommend to continue as needed      Relevant Orders   CK (Completed)   CBC with Differential/Platelet (Completed)   Comprehensive metabolic panel (Completed)   TSH (Completed)   Magnesium  (Completed)   Ambulatory referral to Gastroenterology   Urinalysis (Completed)   Increased CPK level    Rhabdomyolysis with diffuse cramping At risk for acute kidney failure Recommend IV hydration Advised to go to hospital emergency department tonight        Addendum note 4:44 PM: Blood work shows severe hyponatremia and hypochloremia and increased CPK at 722. Patient called.  Results discussed with him and his wife. Advised to go to emergency department now for further evaluation and treatment.  Patient Instructions  Health Maintenance After Age 24 After age 67, you are at a higher risk for certain long-term diseases and infections as well as injuries from falls. Falls are a major cause of broken bones and head injuries in people who are older than age 3. Getting regular preventive care can help to keep you healthy and well. Preventive care includes getting regular testing and making lifestyle changes as recommended by your health care provider. Talk with your health care provider about: Which screenings and tests you should have. A screening is a test that checks for a disease when you have no symptoms. A diet and exercise plan that is right for you. What should I know about screenings and tests to prevent falls? Screening and testing are the best ways to find a health problem early. Early diagnosis and treatment give you the best chance of managing medical conditions that are common after age 25. Certain conditions and lifestyle choices may make you more likely to have a fall. Your health care provider may recommend: Regular vision checks. Poor vision and conditions such as cataracts can make you more likely to have a fall. If you wear glasses,  make sure to get your prescription updated if your vision changes. Medicine review. Work with your health care provider to regularly review all of the medicines you are taking, including over-the-counter medicines. Ask your health care provider  about any side effects that may make you more likely to have a fall. Tell your health care provider if any medicines that you take make you feel dizzy or sleepy. Strength and balance checks. Your health care provider may recommend certain tests to check your strength and balance while standing, walking, or changing positions. Foot health exam. Foot pain and numbness, as well as not wearing proper footwear, can make you more likely to have a fall. Screenings, including: Osteoporosis screening. Osteoporosis is a condition that causes the bones to get weaker and break more easily. Blood pressure screening. Blood pressure changes and medicines to control blood pressure can make you feel dizzy. Depression screening. You may be more likely to have a fall if you have a fear of falling, feel depressed, or feel unable to do activities that you used to do. Alcohol use screening. Using too much alcohol can affect your balance and may make you more likely to have a fall. Follow these instructions at home: Lifestyle Do not drink alcohol if: Your health care provider tells you not to drink. If you drink alcohol: Limit how much you have to: 0-1 drink a day for women. 0-2 drinks a day for men. Know how much alcohol is in your drink. In the [place] one drink equals one 12 oz bottle of beer (355 mL), one 5 oz glass of wine (148 mL), or one 1 oz glass of hard liquor (44 mL). Do not use any products that contain nicotine or tobacco. These products include cigarettes, chewing tobacco, and vaping devices, such as e-cigarettes. If you need help quitting, ask your health care provider. Activity  Follow a regular exercise program to stay fit. This will help you maintain your balance. Ask your health care provider what types of exercise are appropriate for you. If you need a cane or walker, use it as recommended by your health care provider. Wear supportive shoes that have nonskid soles. Safety  Remove any tripping  hazards, such as rugs, cords, and clutter. Install safety equipment such as grab bars in bathrooms and safety rails on stairs. Keep rooms and walkways well-lit. General instructions Talk with your health care provider about your risks for falling. Tell your health care provider if: You fall. Be sure to tell your health care provider about all falls, even ones that seem minor. You feel dizzy, tiredness (fatigue), or off-balance. Take over-the-counter and prescription medicines only as told by your health care provider. These include supplements. Eat a healthy diet and maintain a healthy weight. A healthy diet includes low-fat dairy products, low-fat (lean) meats, and fiber from whole grains, beans, and lots of fruits and vegetables. Stay current with your vaccines. Schedule regular health, dental, and eye exams. Summary Having a healthy lifestyle and getting preventive care can help to protect your health and wellness after age 31. Screening and testing are the best way to find a health problem early and help you avoid having a fall. Early diagnosis and treatment give you the best chance for managing medical conditions that are more common for people who are older than age 25. Falls are a major cause of broken bones and head injuries in people who are older than age 20. Take precautions to prevent a fall at home. Work with  your health care provider to learn what changes you can make to improve your health and wellness and to prevent falls. This information is not intended to replace advice given to you by your health care provider. Make sure you discuss any questions you have with your health care provider. Document Revised: 03/13/2021 Document Reviewed: 03/13/2021 Elsevier Patient Education  2024 Elsevier Inc.     Edwina Barth, MD [facility]

## 2023-10-07 NOTE — Plan of Care (Signed)

## 2023-10-07 NOTE — Assessment & Plan Note (Signed)
Of recent onset.  Recommend blood work today Recent chest x-ray normal. Urinalysis done today. Recommend GI evaluation for upper endoscopy

## 2023-10-07 NOTE — ED Notes (Signed)
..ED TO INPATIENT HANDOFF REPORT  ED Nurse Name and Phone #: Lyndel Safe Name/Age/Gender Joseph Rocha 88 y.o. male Room/Bed: WA10/WA10  Code Status   Code Status: Not on file  Home/SNF/Other Home Patient oriented to: self, place, time, and situation Is this baseline? Yes   Triage Complete: Triage complete  Chief Complaint Hyponatremia [E87.1]  Triage Note Pt referred to the ER by his PCP. Pt seen due to camps in his legs/hands, discomfort in his shoulder/neck, and some nausea/emesis. Pts lab work showed a sodium level of 122, and an issue with his kidneys.    Allergies Allergies  Allergen Reactions   Lipitor [Atorvastatin] Other (See Comments)    Myalgias, cramps in hand    Level of Care/Admitting Diagnosis ED Disposition     ED Disposition  Admit   Condition  --   Comment  Hospital Area: [facility]  HOSPITAL [[id number]]  Level of Care: Progressive [102]  Admit to Progressive based on following criteria: GI, ENDOCRINE disease patients with GI bleeding, acute liver failure or pancreatitis, stable with diabetic ketoacidosis or thyrotoxicosis (hypothyroid) state.  May admit patient to [facility] or [facility] if equivalent level of care is available:: Yes  Covid Evaluation: Asymptomatic - no recent exposure (last 10 days) testing not required  Diagnosis: Hyponatremia [198519]  Admitting Physician: Briscoe Deutscher [[id number]]  Attending Physician: Briscoe Deutscher [[id number]]  Certification:: I certify this patient will need inpatient services for at least 2 midnights  Expected Medical Readiness: 10/10/2023          B Medical/Surgery History 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   Past Surgical History:  Procedure  Laterality Date   DOPPLER ECHOCARDIOGRAPHY  12/04/2001   ELECTROCARDIOGRAM  03/25/2006   EP IMPLANTABLE DEVICE N/A 03/16/2016   Procedure: Loop Recorder Insertion;  Surgeon: Duke Salvia, MD;  Location: [facility] INVASIVE CV LAB;  Service: Cardiovascular;  Laterality: N/A;   KNEE ARTHROSCOPY Left    LUMBAR DISC SURGERY     MENISCUS REPAIR Right may 2015   TONSILLECTOMY     TRANSPHENOIDAL / TRANSNASAL HYPOPHYSECTOMY / RESECTION PITUITARY TUMOR  2005     A IV Location/Drains/Wounds Patient Lines/Drains/Airways Status     Active Line/Drains/Airways     Name Placement date Placement time Site Days   Peripheral IV 10/07/23 20 G Anterior;Distal;Right;Upper Arm 10/07/23  2040  Arm  less than 1            Intake/Output Last 24 hours No intake or output data in the 24 hours ending 10/07/23 2118  Labs/Imaging Results for orders placed or performed during the hospital encounter of 10/07/23 (from the past 48 hour(s))  Basic metabolic panel     Status: Abnormal   Collection Time: 10/07/23  8:38 PM  Result Value Ref Range   Sodium 121 (L) 135 - 145 mmol/L   Potassium 4.1 3.5 - 5.1 mmol/L   Chloride 90 (L) 98 - 111 mmol/L   CO2 25 22 - 32 mmol/L   Glucose, Bld 131 (H) 70 - 99 mg/dL    Comment: Glucose reference range applies only to samples taken after fasting for at least 8 hours.   BUN 12 8 -  23 mg/dL   Creatinine, Ser 1.61 0.61 - 1.24 mg/dL   Calcium 8.7 (L) 8.9 - 10.3 mg/dL   GFR, Estimated >09 >60 mL/min    Comment: (NOTE) Calculated using the CKD-EPI Creatinine Equation (2021)    Anion gap 6 5 - 15    Comment: Performed at [facility], [address] [place], [place] [address]  CBC     Status: Abnormal   Collection Time: 10/07/23  8:38 PM  Result Value Ref Range   WBC 5.8 4.0 - 10.5 K/uL   RBC 4.01 (L) 4.22 - 5.81 MIL/uL   Hemoglobin 12.7 (L) 13.0 - 17.0 g/dL   HCT 81.1 (L) 91.4 - 78.2 %   MCV 91.8 80.0 - 100.0 fL   MCH 31.7 26.0 - 34.0 pg   MCHC 34.5  30.0 - 36.0 g/dL   RDW 95.6 21.3 - 08.6 %   Platelets 191 150 - 400 K/uL   nRBC 0.0 0.0 - 0.2 %    Comment: Performed at [facility], [address] [place], [place] [address]  CBG monitoring, ED     Status: Abnormal   Collection Time: 10/07/23  8:49 PM  Result Value Ref Range   Glucose-Capillary 101 (H) 70 - 99 mg/dL    Comment: Glucose reference range applies only to samples taken after fasting for at least 8 hours.   No results found.  Pending Labs Unresulted Labs (From admission, onward)     Start     Ordered   10/07/23 2115  Sodium, urine, random  ONCE - URGENT,   URGENT        10/07/23 2114   10/07/23 2115  Osmolality, urine  ONCE - URGENT,   URGENT        10/07/23 2114   10/07/23 2106  Resp panel by RT-PCR (RSV, Flu A&B, Covid) Anterior Nasal Swab  Once,   R        10/07/23 2105            Vitals/Pain Today's Vitals   10/07/23 1745 10/07/23 1751 10/07/23 2104  BP: 120/79  133/80  Pulse: 79  67  Resp: 16  14  Temp: 98.3 F (36.8 C)  97.8 F (36.6 C)  TempSrc: Oral  Oral  SpO2: 99%  90%  PainSc:  10-Worst pain ever     Isolation Precautions No active isolations  Medications Medications  magnesium sulfate IVPB 2 g 50 mL (has no administration in time range)  sodium chloride 0.9 % bolus 1,000 mL (1,000 mLs Intravenous New Bag/Given 10/07/23 2040)    Mobility walks with device     Focused Assessments     R Recommendations: See Admitting Provider Note  Report given to:   Additional Notes:

## 2023-10-07 NOTE — ED Provider Notes (Signed)
[place] EMERGENCY DEPARTMENT AT [facility] Provider Note   CSN: [id number] Arrival date & time: 10/07/23  1719     History  Chief Complaint  Patient presents with   Abnormal Lab    Joseph Rocha is a 88 y.o. male.  The history is provided by the patient and medical records. No language interpreter was used.  Abnormal Lab    88 year old male with significant history of diabetes, hypertension, hyperlipidemia, pituitary adenoma sent here from PCP with concerns of abnormal lab values.  Patient states for the past week patient has had cramping to his extremities with intermittent back pain.  Also reporting lack of appetite, feeling nauseous, and overall not feeling well.  No complaints of fever or chills no chest pain headache trouble urinating.  He was seen by PCP today has lab and was notable for low sodium and patient recommended to come to ER for further care.  Denies any new medication changes except Zofran for nausea.  No reported runny nose sneezing coughing no chest pain shortness of breath no abdominal pain or urinary symptoms.  Home Medications Prior to Admission medications   Medication Sig Start Date End Date Taking? Authorizing Provider  albuterol (VENTOLIN HFA) 108 (90 Base) MCG/ACT inhaler Inhale 2 puffs into the lungs every 6 (six) hours as needed for wheezing or shortness of breath. 10/03/22   [name], MD  aspirin EC 81 MG tablet Take 1 tablet (81 mg total) by mouth daily. Swallow whole. 07/13/20   [name], MD  Azelastine HCl 137 MCG/SPRAY SOLN  07/26/22   [provider]  calcium carbonate (OS-CAL) 600 MG TABS Take 600 mg by mouth daily.    [provider]  cetirizine-pseudoephedrine (ZYRTEC-D) 5-120 MG tablet Take 1 tablet by mouth daily. 05/09/17   Romero Belling, MD  ciprofloxacin-dexamethasone [facility]) OTIC suspension Place 4 drops into the left ear 2 (two) times daily. 07/10/22   Henson, Vickie L, NP-C  cyclobenzaprine  (FLEXERIL) 5 MG tablet Take 1 tablet (5 mg total) by mouth 3 (three) times daily as needed. 09/02/23   [name], MD  diphenoxylate-atropine (LOMOTIL) 2.5-0.025 MG tablet Take 1 tablet by mouth 4 (four) times daily as needed for diarrhea or loose stools. 12/21/21   [name], MD  ferrous sulfate 325 (65 FE) MG EC tablet Take by mouth. 07/26/22   [provider]  finasteride (PROSCAR) 5 MG tablet TAKE 1 TABLET DAILY 04/04/21   [name], MD  fludrocortisone (FLORINEF) 0.1 MG tablet TAKE 1 TABLET DAILY 02/06/23   [name], MD  fluticasone [facility]) 50 MCG/ACT nasal spray Place 2 sprays into both nostrils daily. 10/15/18   Myrlene Broker, MD  folic acid (FOLVITE) 1 MG tablet TAKE 1 TABLET DAILY 04/23/23   [name], MD  guaiFENesin (MUCINEX) 600 MG 12 hr tablet Take 2 tablets (1,200 mg total) by mouth 2 (two) times daily as needed. 10/03/22   [name], MD  IRON PO Take by mouth.    [provider]  KLOR-CON M20 20 MEQ tablet Take 20 mEq by mouth daily.  03/15/13   [provider]  levothyroxine (SYNTHROID) 50 MCG tablet TAKE 1 TABLET DAILY 04/17/23   [name], MD  loperamide (IMODIUM A-D) 2 MG tablet Take 1 tablet (2 mg total) by mouth 2 (two) times daily as needed for diarrhea or loose stools. 03/25/23   Ernie Avena, MD  meloxicam (MOBIC) 7.5 MG  tablet Take 1 tablet (7.5 mg total) by mouth daily as needed for pain. 09/10/23   [name], MD  metFORMIN (GLUCOPHAGE) 500 MG tablet TAKE 1 TABLET TWICE DAILY  WITH MEALS 09/25/23   Carlus Pavlov, MD  Multiple Vitamins-Minerals (CENTRUM SILVER PO) Take 1 tablet by mouth daily.    [provider]  Omega-3 Fatty Acids (FISH OIL) 1000 MG CAPS Take 1 capsule by mouth daily.    [provider]  omeprazole (PRILOSEC) 40 MG capsule TAKE 1 CAPSULE DAILY 04/17/23   [name], MD  ondansetron (ZOFRAN-ODT) 4 MG disintegrating tablet Take 1 tablet (4 mg total) by mouth every 8 (eight)  hours as needed for nausea or vomiting. 05/28/23   [name], MD  predniSONE (DELTASONE) 10 MG tablet 2 tabs by mouth per day for 5 days 03/29/23   [name], MD  promethazine-dextromethorphan (PROMETHAZINE-DM) 6.25-15 MG/5ML syrup Take 5 mLs by mouth 2 (two) times daily as needed. 09/25/22   Myrlene Broker, MD  repaglinide (PRANDIN) 2 MG tablet TAKE 1 TABLET TWICE DAILY  BEFORE MEALS 08/26/23   [name], MD  rosuvastatin (CRESTOR) 5 MG tablet TAKE 1 TABLET DAILY 09/23/23   [name], MD  solifenacin (VESICARE) 5 MG tablet Take 1 tablet (5 mg total) by mouth daily. 09/02/23   [name], MD  Tamsulosin HCl (FLOMAX) 0.4 MG CAPS Take 0.4 mg by mouth daily.    [provider]  Testosterone 30 MG/ACT SOLN Place 2 Pump onto the skin daily.    [provider]  traMADol (ULTRAM) 50 MG tablet Take 1 tablet (50 mg total) by mouth every 6 (six) hours as needed. 05/07/18   Romero Belling, MD  traMADol (ULTRAM) 50 MG tablet Take 1 tablet (50 mg total) by mouth every 6 (six) hours as needed. 07/17/23   [name], MD  triamcinolone (NASACORT) 55 MCG/ACT AERO nasal inhaler Place 2 sprays into the nose daily. 07/13/20   [name], MD      Allergies    Lipitor [atorvastatin]    Review of Systems   Review of Systems  All other systems reviewed and are negative.   Physical Exam Updated Vital Signs BP 120/79 (BP Location: Left Arm)   Pulse 79   Temp 98.3 F (36.8 C) (Oral)   Resp 16   SpO2 99%  Physical Exam Vitals and nursing note reviewed.  Constitutional:      General: He is not in acute distress.    Appearance: He is well-developed.  HENT:     Head: Normocephalic and atraumatic.  Eyes:     Conjunctiva/sclera: Conjunctivae normal.  Cardiovascular:     Rate and Rhythm: Normal rate and regular rhythm.     Pulses: Normal pulses.     Heart sounds: Normal heart sounds.  Pulmonary:     Effort: Pulmonary effort is normal.     Breath sounds: Normal  breath sounds. No wheezing, rhonchi or rales.  Abdominal:     Palpations: Abdomen is soft.     Tenderness: There is no abdominal tenderness.  Musculoskeletal:        General: Normal range of motion.     Cervical back: Neck supple.     Comments: Equal strength all 4 extremities  Skin:    Findings: No rash.  Neurological:     Mental Status: He is alert. Mental status is at baseline.     ED Results / Procedures /  Treatments   Labs (all labs ordered are listed, but only abnormal results are displayed) Labs Reviewed - No data to display       Component Ref Range & Units 14:00 9 yr ago 11 yr ago  Total CK 7 - 232 U/L 722 High  198 451 High   Resulting Agency [place] [facility] [place] [facility]         Specimen Collected: 10/07/23 14:00 Last Resulted: 10/07/23 15:54     Status: Final result     Visible to patient: No (inaccessible in MyChart)     Next appt: 01/16/2024 at 01:20 PM in Internal Medicine Oliver Barre, MD)     Dx: Cramps, extremity; Lack of appetite; ...   0 Result Notes          Component Ref Range & Units 14:00 (10/07/23) 2 mo ago (07/17/23) 7 mo ago (02/21/23) 1 yr ago (08/21/22) 1 yr ago (07/31/22) 1 yr ago (03/20/22) 2 yr ago (01/16/21)  Color, Urine Yellow;Lt. Yellow;Straw;Dark Yellow;Amber;Green;Red;Brown YELLOW YELLOW YELLOW YELLOW YELLOW R YELLOW YELLOW  APPearance Clear;Turbid;Slightly Cloudy;Cloudy CLEAR CLEAR CLEAR CLEAR CLEAR R CLEAR CLEAR  Specific Gravity, Urine 1.000 - 1.030 1.015 1.020 1.020 1.015 1.010 R 1.010 1.015  pH 5.0 - 8.0 6.0 6.0 6.0 6.5 6.5 5.5 7.0  Total Protein, Urine Negative NEGATIVE NEGATIVE NEGATIVE NEGATIVE  NEGATIVE NEGATIVE  Urine Glucose Negative NEGATIVE NEGATIVE NEGATIVE NEGATIVE  NEGATIVE NEGATIVE  Ketones, ur Negative TRACE Abnormal  NEGATIVE NEGATIVE TRACE Abnormal  NEGATIVE R NEGATIVE NEGATIVE  Bilirubin Urine Negative NEGATIVE NEGATIVE NEGATIVE NEGATIVE NEGATIVE R NEGATIVE NEGATIVE  Hgb urine  dipstick Negative NEGATIVE NEGATIVE NEGATIVE NEGATIVE NEGATIVE R NEGATIVE NEGATIVE  Urobilinogen, UA 0.0 - 1.0 0.2 0.2 0.2 1.0  0.2 1.0  Leukocytes,Ua Negative NEGATIVE NEGATIVE NEGATIVE NEGATIVE NEGATIVE R, CM NEGATIVE NEGATIVE  Nitrite Negative NEGATIVE NEGATIVE NEGATIVE NEGATIVE NEGATIVE R NEGATIVE NEGATIVE  Resulting Agency [place] [facility] [place] [facility] [place] [facility] [place] [facility] [place] [facility] [place] [facility]         Specimen Collected: 10/07/23 14:00 Last Resulted: 10/07/23 15:18      0 Result Notes          Component Ref Range & Units 14:00 (10/07/23) 6 mo ago (03/29/23) 6 mo ago (03/25/23) 6 mo ago (03/25/23) 1 yr ago (07/31/22) 5 yr ago (02/18/18) 9 yr ago (01/29/14)  Magnesium 1.5 - 2.5 mg/dL 1.4 Low  1.5 1.7 R, CM 1.3 Low  R, CM 1.2 Low  R, CM 1.7 1.2 Low   Resulting Agency [place] [facility] [place] [facility] [place] [facility]         Specimen Collected: 10/07/23 14:00 Last Resulted: 10/07/23 15:54               Component Ref Range & Units 14:00 (10/07/23) 2 mo ago (07/17/23) 2 mo ago (07/17/23) 6 mo ago (03/29/23) 6 mo ago (03/25/23) 6 mo ago (03/25/23) 7 mo ago (02/21/23) 7 mo ago (02/21/23)  Sodium 135 - 145 mEq/L 122 Low   136 135  131 Low  R  137  Potassium 3.5 - 5.1 mEq/L 4.0  3.6 4.1 3.4 Low  R, CM 3.2 Low  R  3.6  Chloride 96 - 112 mEq/L 89 Low   99 98  101 R  100  CO2 19 - 32 mEq/L 26  31 30  25  R  30  Glucose, Bld 70 - 99 mg/dL 865 High   784 High  136 High   156 High  CM  132 High   BUN 6 - 23 mg/dL 10  22 21  20  R  22  Creatinine, Ser 0.40 - 1.50 mg/dL 2.72  5.36 6.44  0.34 R  1.18  Total Bilirubin 0.2 - 1.2 mg/dL 0.7 0.5    0.9 R 0.5   Alkaline Phosphatase 39 - 117 U/L 64 54    50 R 48   AST 0 - 37 U/L 38 High  19    30 R 19   ALT 0 - 53 U/L 28 16    27  R 17   Total Protein 6.0 - 8.3 g/dL 6.8 7.1    6.9 R 6.7   Albumin 3.5 - 5.2 g/dL 4.0 4.1    3.9 R 4.1   GFR >60.00 mL/min  63.47  59.51 Low  CM 46.83 Low  CM    56.05 Low  CM  Comment: Calculated using the CKD-EPI Creatinine Equation (2021)  Calcium 8.4 - 10.5 mg/dL 9.1  9.6 9.4  8.2 Low  R  9.2  Resulting Agency [place] [facility] [place] [facility] [place] [facility] [place] [facility] [place] [facility] [place] [facility]         Specimen Collected: 10/07/23 14:00 Last Resulted: 10/07/23 16:00     Component Ref Range & Units 14:00 (10/07/23) 2 mo ago (07/17/23) 6 mo ago (03/25/23) 7 mo ago (02/21/23) 1 yr ago (08/21/22) 1 yr ago (07/31/22) 1 yr ago (03/20/22)  WBC 4.0 - 10.5 K/uL 7.6 7.7 9.1 8.9 6.5 8.7 8.6  RBC 4.22 - 5.81 Mil/uL 4.19 Low  3.99 Low  3.73 Low  R 4.12 Low  3.96 Low  3.57 Low  R 4.03 Low   Hemoglobin 13.0 - 17.0 g/dL 74.2 59.5 Low  63.8 Low  13.0 12.4 Low  11.2 Low  12.4 Low   HCT 39.0 - 52.0 % 38.9 Low  38.0 Low  34.4 Low  38.9 Low  36.7 Low  32.5 Low  36.8 Low   MCV 78.0 - 100.0 fl 93.0 95.2 92.2 R 94.6 92.5 91.0 R 91.2  MCHC 30.0 - 36.0 g/dL 75.6 43.3 29.5 18.8 41.6 34.5 33.8  RDW 11.5 - 15.5 % 13.2 14.0 13.3 13.5 14.1 13.5 13.8  Platelets 150.0 - 400.0 K/uL 233.0 235.0 160 R 200.0 197.0 167 R 195.0  Neutrophils Relative % 43.0 - 77.0 % 61.1 53.8 74 R 59.2 46.1  44.1  Lymphocytes Relative 12.0 - 46.0 % 22.3 33.1 14 R 28.3 36.7  41.5  Monocytes Relative 3.0 - 12.0 % 12.3 High  9.4 10 R 9.1 11.3  10.6  Eosinophils Relative 0.0 - 5.0 % 3.8 2.9 2 R 2.8 5.0  3.3  Basophils Relative 0.0 - 3.0 % 0.5 0.8 0 R 0.6 0.9  0.5  Neutro Abs 1.4 - 7.7 K/uL 4.7 4.1 6.7 R 5.3 3.0  3.8  Lymphs Abs 0.7 - 4.0 K/uL 1.7 2.5 1.3 2.5 2.4  3.6  Monocytes Absolute 0.1 - 1.0 K/uL 0.9 0.7 0.9 0.8 0.7  0.9  Eosinophils Absolute 0.0 - 0.7 K/uL 0.3 0.2 0.2 R 0.3 0.3  0.3  Basophils Absolute 0.0 - 0.1 K/uL 0.0 0.1 0.0 0.1 0.1  0.0  MCH   32.2 R   31.4 R   nRBC   0.0 R   0.0 R, CM   Immature Granulocytes   0 R      Abs Immature Granulocytes   0.04 R, CM  Resulting Agency [place] [facility]  [place] [facility] [place] [facility] [place] [facility] [place] [facility]         Specimen Collected: 10/07/23 14:00 Last Resulted: 10/07/23 15:04       EKG None  Date: 10/07/2023  Rate: 70  Rhythm: normal sinus rhythm  QRS Axis: normal  Intervals: normal  ST/T Wave abnormalities: normal  Conduction Disutrbances: none  Narrative Interpretation:   Old EKG Reviewed: No significant changes noted   Radiology No results found.  Procedures .Critical Care  Performed by: Fayrene Helper, PA-C Authorized by: Fayrene Helper, PA-C   Critical care provider statement:    Critical care time (minutes):  30   Critical care was time spent personally by me on the following activities:  Development of treatment plan with patient or surrogate, discussions with consultants, evaluation of patient's response to treatment, examination of patient, ordering and review of laboratory studies, ordering and review of radiographic studies, ordering and performing treatments and interventions, pulse oximetry, re-evaluation of patient's condition and review of old charts     Medications Ordered in ED Medications  sodium chloride 0.9 % bolus 1,000 mL (1,000 mLs Intravenous New Bag/Given 10/07/23 2040)    ED Course/ Medical Decision Making/ A&P                                 Medical Decision Making Amount and/or Complexity of Data Reviewed Labs: ordered.  Risk Decision regarding hospitalization.   BP 120/79 (BP Location: Left Arm)   Pulse 79   Temp 98.3 F (36.8 C) (Oral)   Resp 16   SpO2 99%   34:60 PM   88 year old male with significant history of diabetes, hypertension, hyperlipidemia, pituitary adenoma sent here from PCP with concerns of abnormal lab values.  Patient states for the past week patient has had cramping to his extremities with intermittent back pain.  Also reporting lack of appetite, feeling nauseous, and overall not feeling well.  No complaints of fever or chills  no chest pain headache trouble urinating.  He was seen by PCP today has lab and was notable for low sodium and patient recommended to come to ER for further care.  Denies any new medication changes except Zofran for nausea.  No reported runny nose sneezing coughing no chest pain shortness of breath no abdominal pain or urinary symptoms.  Exam overall reassuring, patient resting comfortably appears to be in no acute discomfort, is mentating at baseline.  He is moving all 4 extremities without difficulty.  Vital signs overall reassuring.  EMR review I was able to see the labs that was done earlier today.  Lab is remarkable total CK of 722.  Patient is currently on Crestor which may contribute to this finding and explains his muscle aches.  Urinalysis without signs of UTI.  Magnesium is slightly low at 1.4.  Sodium is 122 chloride 89 CBG 172.  I have ordered IV fluid but felt patient would need to be admitted for further management. Care discussed with Dr. Wilkie Aye.     -The patient was maintained on a cardiac monitor.  I personally viewed and interpreted the cardiac monitored which showed an underlying rhythm of: NSR -Imaging including abd/pelvis CT considered but abdominal exam is benign -This patient presents to the ED for concern of abnormal labs, this involves an extensive number of treatment options, and is a complaint that carries with it a high risk  of complications and morbidity.  The differential diagnosis includes lab errors, volume depletion, myositis, dehydration, viral illness, drug induced rhabdo -Co morbidities that complicate the patient evaluation includes DM, HLD, HTN -Treatment includes IVF -Reevaluation of the patient after these medicines showed that the patient stayed the same -PCP office notes or outside notes reviewed -Discussion with specialist [facility] Dr. Antionette Char who agrees to admit pt -Escalation to admission/observation considered: patient is agreeable with admission.           Final Clinical Impression(s) / ED Diagnoses Final diagnoses:  Hyponatremia  Myalgia    Rx / DC Orders ED Discharge Orders     None         Fayrene Helper, PA-C 10/07/23 2111    Rozelle Logan, DO 10/09/23 1617

## 2023-10-07 NOTE — Assessment & Plan Note (Signed)
With loss of appetite Fairly recent onset Needs GI evaluation for possible upper endoscopy Malignancy needs to be excluded

## 2023-10-07 NOTE — ED Provider Triage Note (Signed)
Emergency Medicine Provider Triage Evaluation Note  AROLDO ROKUSEK , a 88 y.o. male  was evaluated in triage.  Pt complains of aches.  Review of Systems  Positive: Crmaps and muscle pain, nausea Negative: Fever, diarrhea  Physical Exam  BP 120/79 (BP Location: Left Arm)   Pulse 79   Temp 98.3 F (36.8 C) (Oral)   Resp 16   SpO2 99%  Gen:   Awake, no distress   Resp:  Normal effort  MSK:   Moves extremities without difficulty  Other:    Medical Decision Making  Medically screening exam initiated at 5:53 PM.  Appropriate orders placed.  Delaine Lame was informed that the remainder of the evaluation will be completed by another provider, this initial triage assessment does not replace that evaluation, and the importance of remaining in the ED until their evaluation is complete.  Patient seen by MD this morning  ALL LABS RESULTED IN EPIC Na 122, Cl 89, CK 799 Sent here for fluids, further evaluation.    Elpidio Anis, PA-C 10/07/23 1755

## 2023-10-07 NOTE — ED Triage Notes (Signed)
Pt referred to the ER by his PCP. Pt seen due to camps in his legs/hands, discomfort in his shoulder/neck, and some nausea/emesis. Pts lab work showed a sodium level of 122, and an issue with his kidneys.

## 2023-10-07 NOTE — Assessment & Plan Note (Signed)
Clinically euthyroid.  Continue Synthroid 50 mcg daily.  TSH done today. 

## 2023-10-07 NOTE — Assessment & Plan Note (Signed)
Zofran ODT helps. Recommend to continue as needed

## 2023-10-08 DIAGNOSIS — E871 Hypo-osmolality and hyponatremia: Secondary | ICD-10-CM | POA: Diagnosis not present

## 2023-10-08 LAB — PHOSPHORUS: Phosphorus: 2.7 mg/dL (ref 2.5–4.6)

## 2023-10-08 LAB — BASIC METABOLIC PANEL
Anion gap: 9 (ref 5–15)
Anion gap: 6 (ref 5–15)
Anion gap: 6 (ref 5–15)
Anion gap: 8 (ref 5–15)
BUN: 11 mg/dL (ref 8–23)
BUN: 10 mg/dL (ref 8–23)
BUN: 10 mg/dL (ref 8–23)
BUN: 9 mg/dL (ref 8–23)
CO2: 22 mmol/L (ref 22–32)
CO2: 24 mmol/L (ref 22–32)
CO2: 22 mmol/L (ref 22–32)
CO2: 23 mmol/L (ref 22–32)
Calcium: 8.4 mg/dL — ABNORMAL LOW (ref 8.9–10.3)
Calcium: 8.5 mg/dL — ABNORMAL LOW (ref 8.9–10.3)
Calcium: 8.3 mg/dL — ABNORMAL LOW (ref 8.9–10.3)
Calcium: 8.4 mg/dL — ABNORMAL LOW (ref 8.9–10.3)
Chloride: 91 mmol/L — ABNORMAL LOW (ref 98–111)
Chloride: 94 mmol/L — ABNORMAL LOW (ref 98–111)
Chloride: 95 mmol/L — ABNORMAL LOW (ref 98–111)
Chloride: 92 mmol/L — ABNORMAL LOW (ref 98–111)
Creatinine, Ser: 1.02 mg/dL (ref 0.61–1.24)
Creatinine, Ser: 0.86 mg/dL (ref 0.61–1.24)
Creatinine, Ser: 0.98 mg/dL (ref 0.61–1.24)
Creatinine, Ser: 1.19 mg/dL (ref 0.61–1.24)
GFR, Estimated: >60 mL/min (ref >60)
GFR, Estimated: >60 mL/min (ref >60)
GFR, Estimated: >60 mL/min (ref >60)
GFR, Estimated: 59 mL/min — ABNORMAL LOW (ref >60)
Glucose, Bld: 173 mg/dL — ABNORMAL HIGH (ref 70–99)
Glucose, Bld: 131 mg/dL — ABNORMAL HIGH (ref 70–99)
Glucose, Bld: 133 mg/dL — ABNORMAL HIGH (ref 70–99)
Glucose, Bld: 175 mg/dL — ABNORMAL HIGH (ref 70–99)
Potassium: 3.8 mmol/L (ref 3.5–5.1)
Potassium: 3.7 mmol/L (ref 3.5–5.1)
Potassium: 3.7 mmol/L (ref 3.5–5.1)
Potassium: 3.7 mmol/L (ref 3.5–5.1)
Sodium: 122 mmol/L — ABNORMAL LOW (ref 135–145)
Sodium: 124 mmol/L — ABNORMAL LOW (ref 135–145)
Sodium: 123 mmol/L — ABNORMAL LOW (ref 135–145)
Sodium: 123 mmol/L — ABNORMAL LOW (ref 135–145)

## 2023-10-08 LAB — CBC
HCT: 33.3 % — ABNORMAL LOW (ref 39.0–52.0)
Hemoglobin: 11.8 g/dL — ABNORMAL LOW (ref 13.0–17.0)
MCH: 32.3 pg (ref 26.0–34.0)
MCHC: 35.4 g/dL (ref 30.0–36.0)
MCV: 91.2 fL (ref 80.0–100.0)
Platelets: 182 10*3/uL (ref 150–400)
RBC: 3.65 MIL/uL — ABNORMAL LOW (ref 4.22–5.81)
RDW: 12.8 % (ref 11.5–15.5)
WBC: 5.3 10*3/uL (ref 4.0–10.5)
nRBC: 0 % (ref 0.0–0.2)

## 2023-10-08 LAB — HEPATIC FUNCTION PANEL
ALT: 27 U/L (ref 0–44)
AST: 34 U/L (ref 15–41)
Albumin: 3.4 g/dL — ABNORMAL LOW (ref 3.5–5.0)
Alkaline Phosphatase: 55 U/L (ref 38–126)
Bilirubin, Direct: 0.1 mg/dL (ref 0.0–0.2)
Indirect Bilirubin: 0.4 mg/dL (ref 0.3–0.9)
Total Bilirubin: 0.5 mg/dL (ref <1.2)
Total Protein: 5.8 g/dL — ABNORMAL LOW (ref 6.5–8.1)

## 2023-10-08 LAB — GLUCOSE, CAPILLARY
Glucose-Capillary: 100 mg/dL — ABNORMAL HIGH (ref 70–99)
Glucose-Capillary: 148 mg/dL — ABNORMAL HIGH (ref 70–99)
Glucose-Capillary: 122 mg/dL — ABNORMAL HIGH (ref 70–99)
Glucose-Capillary: 179 mg/dL — ABNORMAL HIGH (ref 70–99)

## 2023-10-08 LAB — LIPASE, BLOOD: Lipase: 27 U/L (ref 11–51)

## 2023-10-08 LAB — URIC ACID: Uric Acid, Serum: 6 mg/dL (ref 3.7–8.6)

## 2023-10-08 LAB — MAGNESIUM: Magnesium: 2.1 mg/dL (ref 1.7–2.4)

## 2023-10-08 LAB — CK: Total CK: 587 U/L — ABNORMAL HIGH (ref 49–397)

## 2023-10-08 LAB — OSMOLALITY, URINE: Osmolality, Ur: 372 mosm/kg (ref 300–900)

## 2023-10-08 LAB — CORTISOL: Cortisol, Plasma: 1.4 ug/dL

## 2023-10-08 LAB — SODIUM, URINE, RANDOM: Sodium, Ur: 73 mmol/L

## 2023-10-08 MED ORDER — FLUDROCORTISONE ACETATE 0.1 MG PO TABS
0.1000 mg | ORAL_TABLET | Freq: Every day | ORAL | Status: DC
  Administered 2023-10-08 – 2023-10-09 (×2): 0.1 mg via ORAL
  Filled 2023-10-08 (×2): qty 1

## 2023-10-08 MED ORDER — SODIUM CHLORIDE 0.9 % IV SOLN: INTRAVENOUS | Status: AC

## 2023-10-08 NOTE — Plan of Care (Signed)
  Problem: Education: Goal: Knowledge of General Education information will improve Description: Including pain rating scale, medication(s)/side effects and non-pharmacologic comfort measures Outcome: Completed/Met

## 2023-10-08 NOTE — Consult Note (Addendum)
[facility] Nephrology Consultation Note  Requesting MD: Dr. Lanae Boast Reason for consult: Hyponatremia  HPI:  Joseph Rocha is a 88 y.o. male with past medical history significant for HTN, HLD, type II DM, anemia, pituitary abnormalities with hypogonadism, presented with a poor appetite, nausea and muscle cramps seen as a consultation for the evaluation of hyponatremia. The patient reported that he has nausea and poor appetite for many days to weeks.  He also developed lower extremities cramp.  Apparently home medication listed as Florinef but not sure if he was really taking it.  He does have a history of intermittent hyponatremia in the past and was followed by PCP. In the ER the sodium level was 122, creatinine level 1.06, magnesium level was 1.4.  CK level was elevated to 722, TSH 2.86.  Patient was treated with normal saline IV fluid.  The urine test was checked this morning with sodium level of 73, osmolality is pending. The patient is currently receiving NS 100 cc an hour and eating breakfast well.  He denies any nausea or loss of appetite this morning.  No headache, dizziness, chest pain or shortness of breath.  He is not on diuretics and denies any GI loss including diarrhea.  PMHx:   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    Past Surgical History:  Procedure Laterality Date   DOPPLER ECHOCARDIOGRAPHY  12/04/2001   ELECTROCARDIOGRAM  03/25/2006   EP IMPLANTABLE DEVICE N/A 03/16/2016   Procedure: Loop Recorder Insertion;  Surgeon: Duke Salvia, MD;  Location: [facility] IN[facility]AS[facility];  Service: Cardiovascular;  Laterality: N/A;   KNEE ARTHROSCOPY Left    LUMBAR DISC SURGERY     MENISCUS REPAIR Right may 2015   TONSILLECTOMY      TRANSPHENOIDAL / TRANSNASAL HYPOPHYSECTOMY / RESECTION PITUITARY TUMOR  2005    Family Hx:  Family History  Problem Relation Age of Onset   Diabetes Mellitus II Mother        Deceased, 66s   Heart attack Father    Cancer Brother        uncertain type   Healthy Daughter    Colon cancer Neg Hx    Esophageal cancer Neg Hx    Liver cancer Neg Hx    Pancreatic cancer Neg Hx    Stomach cancer Neg Hx     Social History:  reports that he has never smoked. He has never used smokeless tobacco. He reports that he does not drink alcohol and does not use drugs.  Allergies:  Allergies  Allergen Reactions   Lipitor [Atorvastatin] Other (See Comments)    Myalgias, cramps in hand    Medications: Prior to Admission medications   Medication Sig Start Date End Date Taking? Authorizing Provider  albuterol (VENTOLIN HFA) 108 (90 Base) MCG/ACT inhaler Inhale 2 puffs into the lungs every 6 (six) hours as needed for wheezing or shortness of breath. 10/03/22   [name], MD  aspirin EC 81 MG tablet Take 1 tablet (81 mg total) by mouth daily. Swallow whole. 07/13/20   [name], MD  Azelastine HCl 137 MCG/SPRAY SOLN  07/26/22   [provider]  calcium carbonate (OS-CAL) 600 MG TABS  Take 600 mg by mouth daily.    [provider]  cetirizine-pseudoephedrine (ZYRTEC-D) 5-120 MG tablet Take 1 tablet by mouth daily. 05/09/17   Romero Belling, MD  ciprofloxacin-dexamethasone [facility]) OTIC suspension Place 4 drops into the left ear 2 (two) times daily. 07/10/22   Henson, Vickie L, NP-C  cyclobenzaprine (FLEXERIL) 5 MG tablet Take 1 tablet (5 mg total) by mouth 3 (three) times daily as needed. 09/02/23   [name], MD  diphenoxylate-atropine (LOMOTIL) 2.5-0.025 MG tablet Take 1 tablet by mouth 4 (four) times daily as needed for diarrhea or loose stools. 12/21/21   [name], MD  ferrous sulfate 325 (65 FE) MG EC tablet Take by mouth. 07/26/22   [provider]  finasteride  (PROSCAR) 5 MG tablet TAKE 1 TABLET DAILY 04/04/21   [name], MD  fludrocortisone (FLORINEF) 0.1 MG tablet TAKE 1 TABLET DAILY 02/06/23   [name], MD  fluticasone [facility]) 50 MCG/ACT nasal spray Place 2 sprays into both nostrils daily. 10/15/18   Myrlene Broker, MD  folic acid (FOLVITE) 1 MG tablet TAKE 1 TABLET DAILY 04/23/23   [name], MD  guaiFENesin (MUCINEX) 600 MG 12 hr tablet Take 2 tablets (1,200 mg total) by mouth 2 (two) times daily as needed. 10/03/22   [name], MD  IRON PO Take by mouth.    [provider]  KLOR-CON M20 20 MEQ tablet Take 20 mEq by mouth daily.  03/15/13   [provider]  levothyroxine (SYNTHROID) 50 MCG tablet TAKE 1 TABLET DAILY 04/17/23   [name], MD  loperamide (IMODIUM A-D) 2 MG tablet Take 1 tablet (2 mg total) by mouth 2 (two) times daily as needed for diarrhea or loose stools. 03/25/23   Ernie Avena, MD  meloxicam (MOBIC) 7.5 MG tablet Take 1 tablet (7.5 mg total) by mouth daily as needed for pain. 09/10/23   [name], MD  metFORMIN (GLUCOPHAGE) 500 MG tablet TAKE 1 TABLET TWICE DAILY  WITH MEALS 09/25/23   Carlus Pavlov, MD  Multiple Vitamins-Minerals (CENTRUM SILVER PO) Take 1 tablet by mouth daily.    [provider]  Omega-3 Fatty Acids (FISH OIL) 1000 MG CAPS Take 1 capsule by mouth daily.    [provider]  omeprazole (PRILOSEC) 40 MG capsule TAKE 1 CAPSULE DAILY 04/17/23   [name], MD  ondansetron (ZOFRAN-ODT) 4 MG disintegrating tablet Take 1 tablet (4 mg total) by mouth every 8 (eight) hours as needed for nausea or vomiting. 05/28/23   [name], MD  predniSONE (DELTASONE) 10 MG tablet 2 tabs by mouth per day for 5 days 03/29/23   [name], MD  promethazine-dextromethorphan (PROMETHAZINE-DM) 6.25-15 MG/5ML syrup Take 5 mLs by mouth 2 (two) times daily as needed. 09/25/22   Myrlene Broker, MD  repaglinide (PRANDIN) 2 MG tablet TAKE 1 TABLET TWICE DAILY   BEFORE MEALS 08/26/23   [name], MD  rosuvastatin (CRESTOR) 5 MG tablet TAKE 1 TABLET DAILY 09/23/23   [name], MD  solifenacin (VESICARE) 5 MG tablet Take 1 tablet (5 mg total) by mouth daily. 09/02/23   [name], MD  Tamsulosin HCl (FLOMAX) 0.4 MG CAPS Take 0.4 mg by mouth daily.    [provider]  Testosterone 30 MG/ACT SOLN Place 2 Pump onto the skin daily.    [provider]  traMADol (ULTRAM) 50 MG tablet Take 1 tablet (50 mg total)  by mouth every 6 (six) hours as needed. 05/07/18   Romero Belling, MD  traMADol (ULTRAM) 50 MG tablet Take 1 tablet (50 mg total) by mouth every 6 (six) hours as needed. 07/17/23   [name], MD  triamcinolone (NASACORT) 55 MCG/ACT AERO nasal inhaler Place 2 sprays into the nose daily. 07/13/20   [name], MD    I have reviewed the patient's current medications.  Labs: Renal Panel: Recent Labs  Lab 10/07/23 1400 10/07/23 2038 10/08/23 0147  NA 122* 121* 122*  K 4.0 4.1 3.8  CL 89* 90* 91*  CO2 26 25 22   GLUCOSE 172* 131* 173*  BUN 10 12 11   CREATININE 1.06 1.06 1.02  CALCIUM 9.1 8.7* 8.4*  MG 1.4*  --  2.1  PHOS  --   --  2.7     CBC:    Latest Ref Rng & Units 10/08/2023    1:47 AM 10/07/2023    8:38 PM 10/07/2023    2:00 PM  CBC  WBC 4.0 - 10.5 K/uL 5.3  5.8  7.6   Hemoglobin 13.0 - 17.0 g/dL 27.2  53.6  64.4   Hematocrit 39.0 - 52.0 % 33.3  36.8  38.9   Platelets 150 - 400 K/uL 182  191  233.0      Anemia Panel:  Recent Labs    02/21/23 1441 03/25/23 0812 07/17/23 0926 10/07/23 1400 10/07/23 2038 10/08/23 0147  HGB 13.0 12.0* 12.7* 13.7 12.7* 11.8*  MCV 94.6 92.2 95.2 93.0 91.8 91.2  VITAMINB12 353  --  409  --   --   --     Recent Labs  Lab 10/07/23 1400 10/08/23 0147  AST 38* 34  ALT 28 27  ALKPHOS 64 55  BILITOT 0.7 0.5  PROT 6.8 5.8*  ALBUMIN 4.0 3.4*    Lab Results  Component Value Date   HGBA1C 7.3 (H) 07/17/2023    ROS:  Pertinent items noted in HPI and  remainder of comprehensive ROS otherwise negative.  Physical Exam: Vitals:   10/08/23 0216 10/08/23 0552  BP: 103/64 135/81  Pulse: 80 70  Resp: 16 20  Temp: 97.8 F (36.6 C) 97.7 F (36.5 C)  SpO2: 99% 99%     General exam: Appears calm and comfortable  Respiratory system: Clear to auscultation. Respiratory effort normal. No wheezing or crackle Cardiovascular system: S1 & S2 heard, RRR.  No pedal edema. Gastrointestinal system: Abdomen is nondistended, soft and nontender. Normal bowel sounds heard. Central nervous system: Alert and oriented. No focal neurological deficits. Extremities: Symmetric 5 x 5 power. Skin: No rashes, lesions or ulcers Psychiatry: Judgement and insight appear normal. Mood & affect appropriate.   Assessment/Plan:  # Chronic hyponatremia thought to be due to decreased oral intake/dehydration and possibly related with history of abnormal pituitary function,? Also may have SIADH due to nausea. He supposedly be on Florinef at home, not receiving currently.  Urine sodium was elevated this morning which was after a liter of bolus.  TSH level normal.  Pending uric acid, cortisol and urine osmolality.  I agree with continuing NS IV fluid.  I will resume Florinef at home dose.  Follow labs.  # Chronic nausea and poor appetite before admission: He was eating breakfast well this morning, continue supportive care.  # Hypomagnesemia improved after repletion.  # Nontraumatic mild rhabdomyolysis presumed due to statin use.  Agree with holding statin, CK level trending down.  This might have caused his cramp.  #  Abnormal pituitary function/hypogonadism: The home medications listed testosterone and Florinef.  Resuming Florinef as discussed above.  May benefit from following with endocrinologist as outpatient.  Thank you for the consult, we will continue to follow with you.   Perry Molla Jaynie Collins 10/08/2023, 8:20 AM  [employer].

## 2023-10-08 NOTE — TOC Initial Note (Signed)
Transition of Care [facility]) - Initial/Assessment Note    Patient Details  Name: Joseph Rocha MRN: [id number] Date of Birth: 12-08-1934  Transition of [facility] CM/SW Contact:    Lanier Clam, RN Phone Number: 10/08/2023, 12:41 PM  Clinical Narrative: d/c plan home.                  Expected Discharge Plan: Home/Self Care Barriers to Discharge: Continued Medical Work up   Patient Goals and CMS Choice            Expected Discharge Plan and Services                                              Prior Living Arrangements/Services                       Activities of Daily Living   ADL Screening (condition at time of admission) Independently performs ADLs?: Yes (appropriate for developmental age) Is the patient deaf or have difficulty hearing?: No Does the patient have difficulty seeing, even when wearing glasses/contacts?: No Does the patient have difficulty concentrating, remembering, or making decisions?: No  Permission Sought/Granted                  Emotional Assessment              Admission diagnosis:  Myalgia [M79.10] Hyponatremia [E87.1] Patient Active Problem List   Diagnosis Date Noted   Epigastric fullness 10/07/2023   Lack of appetite 10/07/2023   Cramps, extremity 10/07/2023   Nausea without vomiting 10/07/2023   Increased CPK level 10/07/2023   Abdominal pain 10/07/2023   OAB (overactive bladder) 09/06/2023   Right knee pain 07/17/2023   Nocturnal leg cramps 07/17/2023   Primary osteoarthritis of right knee 07/17/2023   Nausea & vomiting 03/29/2023   Hypokalemia 03/29/2023   Wheezing 10/03/2022   Hyperlipidemia associated with type 2 diabetes mellitus (HCC) 09/13/2022   Thoracic back pain 08/21/2022   Posterior neck pain 08/21/2022   Nausea 12/21/2021   Vertigo 03/24/2021   Vitamin D deficiency 01/16/2021   TMJ arthralgia 02/09/2020   Sinus bradycardia 12/08/2019   Status post placement of implantable loop  recorder 12/08/2019   CKD (chronic kidney disease) stage 3, GFR 30-59 ml/min (HCC) 08/01/2018   Rash 05/27/2018   Hyponatremia 05/27/2018   Diarrhea 02/18/2018   Atrial fibrillation with RVR (HCC) 01/05/2018   Hypomagnesemia 01/05/2018   Constipation 12/05/2017   Hypocalcemia 04/29/2017   Low back pain 11/16/2016   Edema 10/03/2016   Dyspnea 10/03/2016   Hypothyroidism 07/26/2016   Numbness 04/06/2016   Hypotension 11/10/2014   Weight loss 07/07/2014   Sick sinus syndrome (HCC) 03/02/2014   Nausea with vomiting 03/25/2013   Bilateral leg weakness 07/21/2012   Encounter for long-term (current) use of other medications 02/13/2012   Pituitary abnormality (HCC) 02/23/2011   BRADYCARDIA 10/17/2009   DIZZINESS 10/17/2009   DISC DISEASE, CERVICAL 12/28/2008   Hypogonadism male 09/01/2008   Inflammatory and toxic neuropathy (HCC) 09/01/2008   FOOT DROP, RIGHT 09/01/2008   PSA, INCREASED 09/01/2008   PITUITARY MACROADENOMA 05/06/2008   SYNCOPE 05/06/2008   Non-insulin dependent type 2 diabetes mellitus (HCC) 05/14/2007   Anemia 05/14/2007   Diverticulosis of colon 05/14/2007   Osteoarthritis 05/14/2007   PCP:  [name], MD Pharmacy:   [facility] - [place], [place] - [address] [facility] AT Portal to Reg[facility] [address] [place] [address] Phone: [phone] Fax: [phone]  [id number] [place], [place] - [address] AT [facility] [address] [place] [address] Phone: [phone] Fax: [phone]     Social Determinants of Health (SDOH) Social History: SDOH Screenings   Food Insecurity: No Food Insecurity (10/07/2023)  Housing: Low Risk  (10/07/2023)  Transportation Needs: No Transportation Needs (10/07/2023)  Utilities: Not At Risk (10/07/2023)  Alcohol Screen: Low Risk  (06/08/2022)  Depression (PHQ2-9): Low Risk  (10/07/2023)  Financial Resource  Strain: Low Risk  (06/08/2022)  Physical Activity: Inactive (06/08/2022)  Social Connections: Socially Integrated (06/08/2022)  Stress: No Stress Concern Present (06/08/2022)  Tobacco Use: Low Risk  (10/07/2023)   SDOH Interventions:     Readmission Risk Interventions     No data to display

## 2023-10-08 NOTE — Progress Notes (Signed)
Mobility Specialist - Progress Note   10/08/23 1203  Mobility  Activity Ambulated with assistance in hallway  Level of Assistance Modified independent, requires aide device or extra time  Assistive Device Cane  Distance Ambulated (ft) 450 ft  Range of Motion/Exercises Active  Activity Response Tolerated well  Mobility Referral Yes  $Mobility charge 1 Mobility  Mobility Specialist Start Time (ACUTE ONLY) 1152  Mobility Specialist Stop Time (ACUTE ONLY) 1203  Mobility Specialist Time Calculation (min) (ACUTE ONLY) 11 min   Pt was found in bed and agreeable to ambulate. No complaints with session and at EOS returned to recliner chair with all needs met. Call bell in reach.  Billey Chang Mobility Specialist

## 2023-10-08 NOTE — Hospital Course (Addendum)
88 year old male with past medical history of DJD/osteoarthritis/low back pain, diabetes, hld,anemia, hypothyroidism, history of penetrating resection presenting with muscle cramps loss of appetite nausea/vomiting, who was seen in the PCP office, found to have abnormal blood work and sent to the ED reports 6 days of poor appetite and muscle cramps. He has also developed nausea and had an episode of nonbloody vomiting yesterday evening. He denies abdominal pain, fever, chills, chest pain, or diarrhea. He does not drink beer. He has not been drinking excessive amounts of water or other fluids. He has been taking ODT Zofran with improvement in his nausea. He has not had any other new medicines recently.  In the ED vitals stable afebrile, sodium at 121 from 122, magnesium 1.4 normal TSH CK at 722,normal BUN and creatinine CBC with mild anemia influenza RSV COVID-negative. Patient was admitted for further management

## 2023-10-08 NOTE — Progress Notes (Signed)
PROGRESS NOTE Joseph Rocha  JXB:[id number] DOB: 12/12/1934 DOA: 10/07/2023 PCP: [name], MD  Brief Narrative/Hospital Course: 88 year old male with past medical history of DJD/osteoarthritis/low back pain, diabetes, hld,anemia, hypothyroidism, history of penetrating resection presenting with muscle cramps loss of appetite nausea/vomiting, who was seen in the PCP office, found to have abnormal blood work and sent to the ED reports 6 days of poor appetite and muscle cramps. He has also developed nausea and had an episode of nonbloody vomiting yesterday evening. He denies abdominal pain, fever, chills, chest pain, or diarrhea. He does not drink beer. He has not been drinking excessive amounts of water or other fluids. He has been taking ODT Zofran with improvement in his nausea. He has not had any other new medicines recently.  In the ED vitals stable afebrile, sodium at 121 from 122, magnesium 1.4 normal TSH CK at 722,normal BUN and creatinine CBC with mild anemia influenza RSV COVID-negative. Patient was admitted for further management  Subjective: Patient seen and examined Resting comfortably Reports he ate a good meal since last Wednesday for the first time today-no nausea vomiting Overnight afebrile BP stable- labs sodium still low at 122 CK5 8 7  Assessment and Plan: Principal Problem:   Hyponatremia Active Problems:   Non-insulin dependent type 2 diabetes mellitus (HCC)   Hypothyroidism   Hyperlipidemia associated with type 2 diabetes mellitus (HCC)   Hypomagnesemia   Hyponatremia: Baseline sodium 131-136.On admit 121, patient having poor appetite some nausea and episode of vomiting recently.  But urine sodium is elevated at 73, ?siadh.  Admitted with NS at 100 cc/h sodium largely unchanged BUN/creatinine stable, check uric acid, TSH normal, check cortisol, consulted nephrology.   Recent Labs  Lab 10/07/23 1400 10/07/23 2038 10/08/23 0147  NA 122* 121* 122*     Hypomagnesemia: Replaced  Muscle cramps: Replace electrolytes, supportive care hold Crestor  Nausea Poor appetite: Abdominal exam benign, tolerating diet well today.  Continue supportive care  Mild rhabdomyolysis, atraumatic: CK at 587, cont hydration  Mild chronic anemia: Hemoglobin stable 11-12 g  Diabetes mellitus A1c well-controlled in September 7 0.3: Recent Labs  Lab 10/07/23 2049 10/07/23 2251 10/08/23 0745  GLUCAP 101* 171* 100*    Hypothyroidism: TSH normal, cont Synthroid  DVT prophylaxis: enoxaparin (LOVENOX) injection 40 mg Start: 10/07/23 2200 Code Status:   Code Status: Full Code Family Communication: plan of care discussed with patien  at bedside. Patient status is: Inpatient because of hyponatremia Level of care: Progressive   Dispo: The patient is from: home w/ wife            Anticipated disposition: TBD Objective: Vitals last 24 hrs: Vitals:   10/07/23 2212 10/08/23 0216 10/08/23 0552 10/08/23 1017  BP:  103/64 135/81 94/62  Pulse:  80 70 79  Resp:  16 20 18   Temp:  97.8 F (36.6 C) 97.7 F (36.5 C)   TempSrc:  Oral Oral   SpO2:  99% 99% 99%  Weight: 90 kg     Height: 6\' 3"  (1.905 m)      Weight change:   Physical Examination: General exam: alert awake, older than stated age HEENT:Oral mucosa moist, Ear/Nose WNL grossly Respiratory system: bilaterally clear BS, no use of accessory muscle Cardiovascular system: S1 & S2 +, No JVD. Gastrointestinal system: Abdomen soft,NT,ND, BS+ Nervous System:Alert, awake, moving extremities. Extremities: LE edema neg,distal peripheral pulses palpable.  Skin: No rashes,no icterus. MSK: Normal muscle bulk,tone, power  Medications reviewed:  Scheduled Meds:  aspirin  EC  81 mg Oral Daily   enoxaparin (LOVENOX) injection  40 mg Subcutaneous Q24H   fesoterodine  4 mg Oral Daily   finasteride  5 mg Oral Daily   insulin aspart  0-5 Units Subcutaneous QHS   insulin aspart  0-6 Units Subcutaneous TID WC    levothyroxine  50 mcg Oral Q0600   pantoprazole  40 mg Oral Daily   sodium chloride flush  3 mL Intravenous Q12H   tamsulosin  0.4 mg Oral Daily   Continuous Infusions:  sodium chloride 100 mL/hr at 10/08/23 1610      Diet Order             Diet regular Fluid consistency: Thin  Diet effective now                   Intake/Output Summary (Last 24 hours) at 10/08/2023 1041 Last data filed at 10/08/2023 0500 Gross per 24 hour  Intake 691.35 ml  Output 400 ml  Net 291.35 ml   Net IO Since Admission: 291.35 mL [10/08/23 1041]  Wt Readings from Last 3 Encounters:  10/07/23 90 kg  10/07/23 93.9 kg  09/02/23 94.3 kg     Unresulted Labs (From admission, onward)     Start     Ordered   10/14/23 0500  Creatinine, serum  (enoxaparin (LOVENOX)    CrCl >/= 30 ml/min)  Weekly,   R     Comments: while on enoxaparin therapy    10/07/23 2128   10/08/23 0757  Uric acid  Add-on,   AD       Question:  Specimen collection method  Answer:  Lab=Lab collect   10/08/23 0756   10/08/23 0754  Cortisol  Add-on,   AD       Question:  Specimen collection method  Answer:  Lab=Lab collect   10/08/23 0754   10/08/23 0500  CBC  Daily,   R      10/07/23 2128   10/08/23 0200  Basic metabolic panel  Now then every 6 hours,   R      10/07/23 2128   10/07/23 2115  Osmolality, urine  ONCE - URGENT,   URGENT        10/07/23 2114          Data Reviewed: I have personally reviewed following labs and imaging studies CBC: Recent Labs  Lab 10/07/23 1400 10/07/23 2038 10/08/23 0147  WBC 7.6 5.8 5.3  NEUTROABS 4.7  --   --   HGB 13.7 12.7* 11.8*  HCT 38.9* 36.8* 33.3*  MCV 93.0 91.8 91.2  PLT 233.0 191 182   Basic Metabolic Panel:  Recent Labs  Lab 10/07/23 1400 10/07/23 2038 10/08/23 0147  NA 122* 121* 122*  K 4.0 4.1 3.8  CL 89* 90* 91*  CO2 26 25 22   GLUCOSE 172* 131* 173*  BUN 10 12 11   CREATININE 1.06 1.06 1.02  CALCIUM 9.1 8.7* 8.4*  MG 1.4*  --  2.1  PHOS  --   --  2.7    GFR: Estimated Creatinine Clearance: 62.1 mL/min (by C-G formula based on SCr of 1.02 mg/dL). Liver Function Tests:  Recent Labs  Lab 10/07/23 1400 10/08/23 0147  AST 38* 34  ALT 28 27  ALKPHOS 64 55  BILITOT 0.7 0.5  PROT 6.8 5.8*  ALBUMIN 4.0 3.4*   Recent Labs  Lab 10/08/23 0147  LIPASE 27   No results for input(s): "AMMONIA" in the last 168 hours.  Coagulation Profile: No results for input(s): "INR", "PROTIME" in the last 168 hours. No results for input(s): "PROBNP" in the last 168 hours.  No results for input(s): "HGBA1C" in the last 72 hours. Recent Labs  Lab 10/07/23 2049 10/07/23 2251 10/08/23 0745  GLUCAP 101* 171* 100*   No results for input(s): "CHOL", "HDL", "LDLCALC", "TRIG", "CHOLHDL", "LDLDIRECT" in the last 72 hours. Recent Labs    10/07/23 1400  TSH 2.86   Sepsis Labs: No results for input(s): "PROCALCITON", "LATICACIDVEN" in the last 168 hours.  Recent Results (from the past 240 hour(s))  Resp panel by RT-PCR (RSV, Flu A&B, Covid) Anterior Nasal Swab     Status: None   Collection Time: 10/07/23 11:07 PM   Specimen: Anterior Nasal Swab  Result Value Ref Range Status   SARS Coronavirus 2 by RT PCR NEGATIVE NEGATIVE Final    Comment: (NOTE) SARS-CoV-2 target nucleic acids are NOT DETECTED.  The SARS-CoV-2 RNA is generally detectable in upper respiratory specimens during the acute phase of infection. The lowest concentration of SARS-CoV-2 viral copies this assay can detect is 138 copies/mL. A negative result does not preclude SARS-Cov-2 infection and should not be used as the sole basis for treatment or other patient management decisions. A negative result may occur with  improper specimen collection/handling, submission of specimen other than nasopharyngeal swab, presence of viral mutation(s) within the areas targeted by this assay, and inadequate number of viral copies(<138 copies/mL). A negative result must be combined with clinical  observations, patient history, and epidemiological information. The expected result is Negative.  Fact Sheet for Patients:  BloggerCourse.com  Fact Sheet for Healthcare Providers:  SeriousBroker.it  This test is no t yet approved or cleared by the [place] FDA and  has been authorized for detection and/or diagnosis of SARS-CoV-2 by FDA under an Emergency Use Authorization (EUA). This EUA will remain  in effect (meaning this test can be used) for the duration of the COVID-19 declaration under Section 564(b)(1) of the Act, 21 U.S.C.section 360bbb-3(b)(1), unless the authorization is terminated  or revoked sooner.       Influenza A by PCR NEGATIVE NEGATIVE Final   Influenza B by PCR NEGATIVE NEGATIVE Final    Comment: (NOTE) The Xpert Xpress SARS-CoV-2/FLU/RSV plus assay is intended as an aid in the diagnosis of influenza from Nasopharyngeal swab specimens and should not be used as a sole basis for treatment. Nasal washings and aspirates are unacceptable for Xpert Xpress SARS-CoV-2/FLU/RSV testing.  Fact Sheet for Patients: BloggerCourse.com  Fact Sheet for Healthcare Providers: SeriousBroker.it  This test is not yet approved or cleared by the [place] FDA and has been authorized for detection and/or diagnosis of SARS-CoV-2 by FDA under an Emergency Use Authorization (EUA). This EUA will remain in effect (meaning this test can be used) for the duration of the COVID-19 declaration under Section 564(b)(1) of the Act, 21 U.S.C. section 360bbb-3(b)(1), unless the authorization is terminated or revoked.     Resp Syncytial Virus by PCR NEGATIVE NEGATIVE Final    Comment: (NOTE) Fact Sheet for Patients: BloggerCourse.com  Fact Sheet for Healthcare Providers: SeriousBroker.it  This test is not yet approved or cleared by  the [place] FDA and has been authorized for detection and/or diagnosis of SARS-CoV-2 by FDA under an Emergency Use Authorization (EUA). This EUA will remain in effect (meaning this test can be used) for the duration of the COVID-19 declaration under Section 564(b)(1) of the Act, 21 U.S.C. section 360bbb-3(b)(1), unless the authorization  is terminated or revoked.  Performed at [facility], [address] [place], [place] [address]     Antimicrobials: Anti-infectives (From admission, onward)    None      Culture/Microbiology No results found for: "SDES", "SPECREQUEST", "CULT", "REPTSTATUS"  Radiology Studies: No results found.   LOS: 1 day   Total time spent in review of labs and imaging, patient evaluation, formulation of plan, documentation and communication with family: 50  minutes  Lanae Boast, MD [facility]  10/08/2023, 10:41 AM

## 2023-10-09 DIAGNOSIS — E871 Hypo-osmolality and hyponatremia: Secondary | ICD-10-CM | POA: Diagnosis not present

## 2023-10-09 DIAGNOSIS — M791 Myalgia, unspecified site: Secondary | ICD-10-CM | POA: Diagnosis not present

## 2023-10-09 LAB — GLUCOSE, CAPILLARY: Glucose-Capillary: 123 mg/dL — ABNORMAL HIGH (ref 70–99)

## 2023-10-09 LAB — CBC
HCT: 34.8 % — ABNORMAL LOW (ref 39.0–52.0)
Hemoglobin: 11.8 g/dL — ABNORMAL LOW (ref 13.0–17.0)
MCH: 31.6 pg (ref 26.0–34.0)
MCHC: 33.9 g/dL (ref 30.0–36.0)
MCV: 93.3 fL (ref 80.0–100.0)
Platelets: 190 10*3/uL (ref 150–400)
RBC: 3.73 MIL/uL — ABNORMAL LOW (ref 4.22–5.81)
RDW: 13 % (ref 11.5–15.5)
WBC: 5.1 10*3/uL (ref 4.0–10.5)
nRBC: 0 % (ref 0.0–0.2)

## 2023-10-09 LAB — BASIC METABOLIC PANEL
Anion gap: 7 (ref 5–15)
BUN: 10 mg/dL (ref 8–23)
CO2: 22 mmol/L (ref 22–32)
Calcium: 8.3 mg/dL — ABNORMAL LOW (ref 8.9–10.3)
Chloride: 97 mmol/L — ABNORMAL LOW (ref 98–111)
Creatinine, Ser: 1.02 mg/dL (ref 0.61–1.24)
GFR, Estimated: >60 mL/min (ref >60)
Glucose, Bld: 142 mg/dL — ABNORMAL HIGH (ref 70–99)
Potassium: 3.8 mmol/L (ref 3.5–5.1)
Sodium: 126 mmol/L — ABNORMAL LOW (ref 135–145)

## 2023-10-09 LAB — HEMOGLOBIN A1C
Hgb A1c MFr Bld: 7.9 % — ABNORMAL HIGH (ref 4.8–5.6)
Mean Plasma Glucose: 180.03 mg/dL

## 2023-10-09 MED ORDER — FLUDROCORTISONE ACETATE 0.1 MG PO TABS: 100.0000 ug | ORAL_TABLET | Freq: Every day | ORAL | 1 refills | Status: DC

## 2023-10-09 MED ORDER — POTASSIUM CHLORIDE CRYS ER 20 MEQ PO TBCR
20.0000 meq | EXTENDED_RELEASE_TABLET | Freq: Once | ORAL | Status: AC
  Administered 2023-10-09: 20 meq via ORAL
  Filled 2023-10-09: qty 1

## 2023-10-09 NOTE — Progress Notes (Signed)
[facility] NEPHROLOGY PROGRESS NOTE  Assessment/ Plan: Pt is a 88 y.o. yo male with past medical history significant for HTN, HLD, type II DM, anemia, pituitary abnormalities with hypogonadism, presented with a poor appetite, nausea and muscle cramps seen as a consultation for the evaluation of hyponatremia.    # Chronic hyponatremia thought to be due to decreased oral intake/dehydration and possibly related with history of abnormal pituitary function,? Also may have SIADH due to nausea. He supposedly be on Florinef at home, not receiving currently.  Also noted cortisol level was low.  Resumed Florinef and treated with IV fluid.  TSH level normal.  Sodium level improved with IV fluid and Florinef.   He wants to go home and clinically feels much better.  Recommend to check BMP in a week with PCP or endocrine.  Discussed with the primary team.   # Chronic nausea and poor appetite before admission: Improved now.   # Hypomagnesemia improved after repletion.   # Nontraumatic mild rhabdomyolysis presumed due to statin use.  Agree with holding statin, CK level trending down.  This might have caused his cramp.   # Abnormal pituitary function/hypogonadism: The home medications listed testosterone and Florinef.  Resuming Florinef as discussed above.  May benefit from following with endocrinologist as outpatient.  Subjective:  He reports feeling much better and wanted to go home.  No new event.  Discussed with the primary team. Objective Vital signs in last 24 hours: Vitals:   10/08/23 1017 10/08/23 2002 10/09/23 0500 10/09/23 0503  BP: 94/62 113/72  118/82  Pulse: 79 82  91  Resp: 18 20  19   Temp:  98.8 F (37.1 C)  97.7 F (36.5 C)  TempSrc:  Oral  Oral  SpO2: 99% 99%  99%  Weight:   91.6 kg   Height:       Weight change: 1.627 kg  Intake/Output Summary (Last 24 hours) at 10/09/2023 0918 Last data filed at 10/09/2023 0500 Gross per 24 hour  Intake 2377.72 ml  Output --   Net 2377.72 ml       Labs: RENAL PANEL Recent Labs  Lab 10/07/23 1400 10/07/23 2038 10/08/23 0147 10/08/23 0845 10/08/23 1334 10/08/23 2039 10/09/23 0135  NA 122*   < > 122* 124* 123* 123* 126*  K 4.0   < > 3.8 3.7 3.7 3.7 3.8  CL 89*   < > 91* 94* 95* 92* 97*  CO2 26   < > 22 24 22 23 22   GLUCOSE 172*   < > 173* 131* 133* 175* 142*  BUN 10   < > 11 10 10 9 10   CREATININE 1.06   < > 1.02 0.86 0.98 1.19 1.02  CALCIUM 9.1   < > 8.4* 8.5* 8.3* 8.4* 8.3*  MG 1.4*  --  2.1  --   --   --   --   PHOS  --   --  2.7  --   --   --   --   ALBUMIN 4.0  --  3.4*  --   --   --   --    < > = values in this interval not displayed.    Liver Function Tests: Recent Labs  Lab 10/07/23 1400 10/08/23 0147  AST 38* 34  ALT 28 27  ALKPHOS 64 55  BILITOT 0.7 0.5  PROT 6.8 5.8*  ALBUMIN 4.0 3.4*   Recent Labs  Lab 10/08/23 0147  LIPASE 27   No  results for input(s): "AMMONIA" in the last 168 hours. CBC: Recent Labs    02/21/23 1441 03/25/23 0812 07/17/23 0926 10/07/23 1400 10/07/23 2038 10/08/23 0147 10/09/23 0135  HGB 13.0   < > 12.7* 13.7 12.7* 11.8* 11.8*  MCV 94.6   < > 95.2 93.0 91.8 91.2 93.3  VITAMINB12 353  --  409  --   --   --   --    < > = values in this interval not displayed.    Cardiac Enzymes: Recent Labs  Lab 10/07/23 1400 10/08/23 0147  CKTOTAL 722* 587*   CBG: Recent Labs  Lab 10/08/23 0745 10/08/23 1138 10/08/23 1621 10/08/23 2005 10/09/23 0745  GLUCAP 100* 148* 122* 179* 123*    Iron Studies: No results for input(s): "IRON", "TIBC", "TRANSFERRIN", "FERRITIN" in the last 72 hours. Studies/Results: No results found.  Medications: Infusions:   Scheduled Medications:  aspirin EC  81 mg Oral Daily   enoxaparin (LOVENOX) injection  40 mg Subcutaneous Q24H   fesoterodine  4 mg Oral Daily   finasteride  5 mg Oral Daily   fludrocortisone  0.1 mg Oral Daily   insulin aspart  0-5 Units Subcutaneous QHS   insulin aspart  0-6 Units  Subcutaneous TID WC   levothyroxine  50 mcg Oral Q0600   pantoprazole  40 mg Oral Daily   potassium chloride  20 mEq Oral Once   sodium chloride flush  3 mL Intravenous Q12H   tamsulosin  0.4 mg Oral Daily    have reviewed scheduled and prn medications.  Physical Exam: General:NAD, comfortable Heart:RRR, s1s2 nl Lungs:clear b/l, no crackle Abdomen:soft, Non-tender, non-distended Extremities:No edema Neurology: Alert, awake and following commands.  Celie Desrochers Prasad Aldin Drees 10/09/2023,9:18 AM  LOS: 2 days

## 2023-10-09 NOTE — Plan of Care (Signed)
  Problem: Pain Management: Goal: General experience of comfort will improve Outcome: Progressing   Problem: Safety: Goal: Ability to remain free from injury will improve Outcome: Progressing

## 2023-10-09 NOTE — Progress Notes (Signed)
Review of discharge instructions complete. IV removed. All questions answered.

## 2023-10-09 NOTE — Plan of Care (Signed)
  Problem: Health Behavior/Discharge Planning: Goal: Ability to manage health-related needs will improve Outcome: Progressing   Problem: Clinical Measurements: Goal: Ability to maintain clinical measurements within normal limits will improve Outcome: Progressing Goal: Respiratory complications will improve Outcome: Progressing Goal: Cardiovascular complication will be avoided Outcome: Progressing   Problem: Pain Management: Goal: General experience of comfort will improve Outcome: Progressing

## 2023-10-09 NOTE — Discharge Summary (Signed)
Physician Discharge Summary  Joseph Rocha:[id number] DOB: Jun 27, 1935 DOA: 10/07/2023  PCP: [name], MD  Admit date: 10/07/2023 Discharge date: 10/09/2023  Admitted From: Home Disposition:  Home  Recommendations for Outpatient Follow-up:  Follow up with PCP in 1-2 weeks Please obtain BMP/CBC in one week   Home Health:no Equipment/Devices:None  Discharge Condition:Stable CODE STATUS:Full Diet recommendation: Heart Healthy  Brief/Interim Summary: 88 y.o. male past medical history of DJD, low back pain, diabetes mellitus type 2, history of pituitary tumor s/p resection with muscle cramps loss of appetite, nausea and abnormal lab work of hyponatremia   Discharge Diagnoses:  Principal Problem:   Hyponatremia Active Problems:   Non-insulin dependent type 2 diabetes mellitus (HCC)   Hypothyroidism   Hyperlipidemia associated with type 2 diabetes mellitus (HCC)   Hypomagnesemia  Hyponatremia: Likely due to being noncompliant with his Florinef. He was started on IV fluids and Florinef. Nephrology was consulted sodium improved nicely. Cortisol was 1.4, TSH was 2.8. Will follow-up with endocrinology as an outpatient.  Hypomagnesemia: Potassium repleted now improved.  Muscle cramps: Now resolved.  Nausea and poor appetite: Resolved with IV fluids and Florinef. Mild rhabdomyolysis: Improved with hydration.  Normocytic anemia: Hemoglobin is stable.  Non-insulin diabetes mellitus type 2: No change made to his medication.  Hypothyroidism: Continue Synthroid. Discharge Instructions  Discharge Instructions     Diet - low sodium heart healthy   Complete by: As directed    Increase activity slowly   Complete by: As directed       Allergies as of 10/09/2023       Reactions   Lipitor [atorvastatin] Other (See Comments)   Myalgias, cramps in hand        Medication List     STOP taking these medications    CENTRUM SILVER PO    cetirizine-pseudoephedrine 5-120 MG tablet Commonly known as: ZYRTEC-D   ciprofloxacin-dexamethasone OTIC suspension Commonly known as: Ciprodex   diphenoxylate-atropine 2.5-0.025 MG tablet Commonly known as: Lomotil   folic acid 1 MG tablet Commonly known as: FOLVITE   guaiFENesin 600 MG 12 hr tablet Commonly known as: Mucinex   meloxicam 7.5 MG tablet Commonly known as: MOBIC   promethazine-dextromethorphan 6.25-15 MG/5ML syrup Commonly known as: PROMETHAZINE-DM       TAKE these medications    albuterol 108 (90 Base) MCG/ACT inhaler Commonly known as: VENTOLIN HFA Inhale 2 puffs into the lungs every 6 (six) hours as needed for wheezing or shortness of breath.   aspirin EC 81 MG tablet Take 1 tablet (81 mg total) by mouth daily. Swallow whole.   calcium carbonate 600 MG Tabs tablet Commonly known as: OS-CAL Take 600 mg by mouth daily.   cyclobenzaprine 5 MG tablet Commonly known as: FLEXERIL Take 1 tablet (5 mg total) by mouth 3 (three) times daily as needed.   ferrous sulfate 325 (65 FE) MG EC tablet Take 325 mg by mouth daily.   finasteride 5 MG tablet Commonly known as: PROSCAR TAKE 1 TABLET DAILY   Fish Oil 1000 MG Caps Take 1 capsule by mouth daily.   fludrocortisone 0.1 MG tablet Commonly known as: FLORINEF Take 1 tablet (100 mcg total) by mouth daily.   fluticasone 50 MCG/ACT nasal spray Commonly known as: FLONASE Place 2 sprays into both nostrils daily.   Klor-Con M20 20 MEQ tablet Generic drug: potassium chloride SA Take 20 mEq by mouth daily.   loperamide 2 MG tablet Commonly known as: IMODIUM A-D Take 1 tablet (2 mg total)  by mouth 2 (two) times daily as needed for diarrhea or loose stools.   metFORMIN 500 MG tablet Commonly known as: GLUCOPHAGE TAKE 1 TABLET TWICE DAILY  WITH MEALS   omeprazole 40 MG capsule Commonly known as: PRILOSEC TAKE 1 CAPSULE DAILY   ondansetron 4 MG disintegrating tablet Commonly known as:  ZOFRAN-ODT Take 1 tablet (4 mg total) by mouth every 8 (eight) hours as needed for nausea or vomiting.   repaglinide 2 MG tablet Commonly known as: PRANDIN TAKE 1 TABLET TWICE DAILY  BEFORE MEALS What changed: See the new instructions.   rosuvastatin 5 MG tablet Commonly known as: CRESTOR TAKE 1 TABLET DAILY   solifenacin 5 MG tablet Commonly known as: VESICARE Take 1 tablet (5 mg total) by mouth daily.   Synthroid 50 MCG tablet Generic drug: levothyroxine TAKE 1 TABLET DAILY   tamsulosin 0.4 MG Caps capsule Commonly known as: FLOMAX Take 0.4 mg by mouth daily.   Testosterone 30 MG/ACT Soln Place 2 Pump onto the skin daily.   traMADol 50 MG tablet Commonly known as: ULTRAM Take 1 tablet (50 mg total) by mouth every 6 (six) hours as needed.        Allergies  Allergen Reactions   Lipitor [Atorvastatin] Other (See Comments)    Myalgias, cramps in hand    Consultations: Nephrology   Procedures/Studies: No results found.  Subjective: No complaints  Discharge Exam: Vitals:   10/08/23 2002 10/09/23 0503  BP: 113/72 118/82  Pulse: 82 91  Resp: 20 19  Temp: 98.8 F (37.1 C) 97.7 F (36.5 C)  SpO2: 99% 99%   Vitals:   10/08/23 1017 10/08/23 2002 10/09/23 0500 10/09/23 0503  BP: 94/62 113/72  118/82  Pulse: 79 82  91  Resp: 18 20  19   Temp:  98.8 F (37.1 C)  97.7 F (36.5 C)  TempSrc:  Oral  Oral  SpO2: 99% 99%  99%  Weight:   91.6 kg   Height:        General: Pt is alert, awake, not in acute distress Cardiovascular: RRR, S1/S2 +, no rubs, no gallops Respiratory: CTA bilaterally, no wheezing, no rhonchi Abdominal: Soft, NT, ND, bowel sounds + Extremities: no edema, no cyanosis    The results of significant diagnostics from this hospitalization (including imaging, microbiology, ancillary and laboratory) are listed below for reference.     Microbiology: Recent Results (from the past 240 hour(s))  Resp panel by RT-PCR (RSV, Flu A&B, Covid)  Anterior Nasal Swab     Status: None   Collection Time: 10/07/23 11:07 PM   Specimen: Anterior Nasal Swab  Result Value Ref Range Status   SARS Coronavirus 2 by RT PCR NEGATIVE NEGATIVE Final    Comment: (NOTE) SARS-CoV-2 target nucleic acids are NOT DETECTED.  The SARS-CoV-2 RNA is generally detectable in upper respiratory specimens during the acute phase of infection. The lowest concentration of SARS-CoV-2 viral copies this assay can detect is 138 copies/mL. A negative result does not preclude SARS-Cov-2 infection and should not be used as the sole basis for treatment or other patient management decisions. A negative result may occur with  improper specimen collection/handling, submission of specimen other than nasopharyngeal swab, presence of viral mutation(s) within the areas targeted by this assay, and inadequate number of viral copies(<138 copies/mL). A negative result must be combined with clinical observations, patient history, and epidemiological information. The expected result is Negative.  Fact Sheet for Patients:  BloggerCourse.com  Fact Sheet for Healthcare Providers:  SeriousBroker.it  This test is no t yet approved or cleared by the [place] and  has been authorized for detection and/or diagnosis of SARS-CoV-2 by FDA under an Emergency Use Authorization (EUA). This EUA will remain  in effect (meaning this test can be used) for the duration of the COVID-19 declaration under Section 564(b)(1) of the Act, 21 U.S.C.section 360bbb-3(b)(1), unless the authorization is terminated  or revoked sooner.       Influenza A by PCR NEGATIVE NEGATIVE Final   Influenza B by PCR NEGATIVE NEGATIVE Final    Comment: (NOTE) The Xpert Xpress SARS-CoV-2/FLU/RSV plus assay is intended as an aid in the diagnosis of influenza from Nasopharyngeal swab specimens and should not be used as a sole basis for treatment. Nasal washings  and aspirates are unacceptable for Xpert Xpress SARS-CoV-2/FLU/RSV testing.  Fact Sheet for Patients: BloggerCourse.com  Fact Sheet for Healthcare Providers: SeriousBroker.it  This test is not yet approved or cleared by the [place] FDA and has been authorized for detection and/or diagnosis of SARS-CoV-2 by FDA under an Emergency Use Authorization (EUA). This EUA will remain in effect (meaning this test can be used) for the duration of the COVID-19 declaration under Section 564(b)(1) of the Act, 21 U.S.C. section 360bbb-3(b)(1), unless the authorization is terminated or revoked.     Resp Syncytial Virus by PCR NEGATIVE NEGATIVE Final    Comment: (NOTE) Fact Sheet for Patients: BloggerCourse.com  Fact Sheet for Healthcare Providers: SeriousBroker.it  This test is not yet approved or cleared by the [place] FDA and has been authorized for detection and/or diagnosis of SARS-CoV-2 by FDA under an Emergency Use Authorization (EUA). This EUA will remain in effect (meaning this test can be used) for the duration of the COVID-19 declaration under Section 564(b)(1) of the Act, 21 U.S.C. section 360bbb-3(b)(1), unless the authorization is terminated or revoked.  Performed at [facility], [address] [place], [place] [address]      Labs: BNP (last 3 results) No results for input(s): "BNP" in the last 8760 hours. Basic Metabolic Panel: Recent Labs  Lab 10/07/23 1400 10/07/23 2038 10/08/23 0147 10/08/23 0845 10/08/23 1334 10/08/23 2039 10/09/23 0135  NA 122*   < > 122* 124* 123* 123* 126*  K 4.0   < > 3.8 3.7 3.7 3.7 3.8  CL 89*   < > 91* 94* 95* 92* 97*  CO2 26   < > 22 24 22 23 22   GLUCOSE 172*   < > 173* 131* 133* 175* 142*  BUN 10   < > 11 10 10 9 10   CREATININE 1.06   < > 1.02 0.86 0.98 1.19 1.02  CALCIUM 9.1   < > 8.4* 8.5* 8.3* 8.4*  8.3*  MG 1.4*  --  2.1  --   --   --   --   PHOS  --   --  2.7  --   --   --   --    < > = values in this interval not displayed.   Liver Function Tests: Recent Labs  Lab 10/07/23 1400 10/08/23 0147  AST 38* 34  ALT 28 27  ALKPHOS 64 55  BILITOT 0.7 0.5  PROT 6.8 5.8*  ALBUMIN 4.0 3.4*   Recent Labs  Lab 10/08/23 0147  LIPASE 27   No results for input(s): "AMMONIA" in the last 168 hours. CBC: Recent Labs  Lab 10/07/23 1400 10/07/23 2038 10/08/23 0147 10/09/23 0135  WBC 7.6  5.8 5.3 5.1  NEUTROABS 4.7  --   --   --   HGB 13.7 12.7* 11.8* 11.8*  HCT 38.9* 36.8* 33.3* 34.8*  MCV 93.0 91.8 91.2 93.3  PLT 233.0 191 182 190   Cardiac Enzymes: Recent Labs  Lab 10/07/23 1400 10/08/23 0147  CKTOTAL 722* 587*   BNP: Invalid input(s): "POCBNP" CBG: Recent Labs  Lab 10/08/23 0745 10/08/23 1138 10/08/23 1621 10/08/23 2005 10/09/23 0745  GLUCAP 100* 148* 122* 179* 123*   D-Dimer No results for input(s): "DDIMER" in the last 72 hours. Hgb A1c No results for input(s): "HGBA1C" in the last 72 hours. Lipid Profile No results for input(s): "CHOL", "HDL", "LDLCALC", "TRIG", "CHOLHDL", "LDLDIRECT" in the last 72 hours. Thyroid function studies Recent Labs    10/07/23 1400  TSH 2.86   Anemia work up No results for input(s): "VITAMINB12", "FOLATE", "FERRITIN", "TIBC", "IRON", "RETICCTPCT" in the last 72 hours. Urinalysis    Component Value Date/Time   COLORURINE YELLOW 10/07/2023 1400   APPEARANCEUR CLEAR 10/07/2023 1400   LABSPEC 1.015 10/07/2023 1400   PHURINE 6.0 10/07/2023 1400   GLUCOSEU NEGATIVE 10/07/2023 1400   HGBUR NEGATIVE 10/07/2023 1400   BILIRUBINUR NEGATIVE 10/07/2023 1400   KETONESUR TRACE (A) 10/07/2023 1400   PROTEINUR NEGATIVE 07/31/2022 1639   UROBILINOGEN 0.2 10/07/2023 1400   NITRITE NEGATIVE 10/07/2023 1400   LEUKOCYTESUR NEGATIVE 10/07/2023 1400   Sepsis Labs Recent Labs  Lab 10/07/23 1400 10/07/23 2038 10/08/23 0147  10/09/23 0135  WBC 7.6 5.8 5.3 5.1   Microbiology Recent Results (from the past 240 hour(s))  Resp panel by RT-PCR (RSV, Flu A&B, Covid) Anterior Nasal Swab     Status: None   Collection Time: 10/07/23 11:07 PM   Specimen: Anterior Nasal Swab  Result Value Ref Range Status   SARS Coronavirus 2 by RT PCR NEGATIVE NEGATIVE Final    Comment: (NOTE) SARS-CoV-2 target nucleic acids are NOT DETECTED.  The SARS-CoV-2 RNA is generally detectable in upper respiratory specimens during the acute phase of infection. The lowest concentration of SARS-CoV-2 viral copies this assay can detect is 138 copies/mL. A negative result does not preclude SARS-Cov-2 infection and should not be used as the sole basis for treatment or other patient management decisions. A negative result may occur with  improper specimen collection/handling, submission of specimen other than nasopharyngeal swab, presence of viral mutation(s) within the areas targeted by this assay, and inadequate number of viral copies(<138 copies/mL). A negative result must be combined with clinical observations, patient history, and epidemiological information. The expected result is Negative.  Fact Sheet for Patients:  BloggerCourse.com  Fact Sheet for Healthcare Providers:  SeriousBroker.it  This test is no t yet approved or cleared by the [place] FDA and  has been authorized for detection and/or diagnosis of SARS-CoV-2 by FDA under an Emergency Use Authorization (EUA). This EUA will remain  in effect (meaning this test can be used) for the duration of the COVID-19 declaration under Section 564(b)(1) of the Act, 21 U.S.C.section 360bbb-3(b)(1), unless the authorization is terminated  or revoked sooner.       Influenza A by PCR NEGATIVE NEGATIVE Final   Influenza B by PCR NEGATIVE NEGATIVE Final    Comment: (NOTE) The Xpert Xpress SARS-CoV-2/FLU/RSV plus assay is intended as  an aid in the diagnosis of influenza from Nasopharyngeal swab specimens and should not be used as a sole basis for treatment. Nasal washings and aspirates are unacceptable for Xpert Xpress SARS-CoV-2/FLU/RSV testing.  Fact Sheet  for Patients: BloggerCourse.com  Fact Sheet for Healthcare Providers: SeriousBroker.it  This test is not yet approved or cleared by the [place] FDA and has been authorized for detection and/or diagnosis of SARS-CoV-2 by FDA under an Emergency Use Authorization (EUA). This EUA will remain in effect (meaning this test can be used) for the duration of the COVID-19 declaration under Section 564(b)(1) of the Act, 21 U.S.C. section 360bbb-3(b)(1), unless the authorization is terminated or revoked.     Resp Syncytial Virus by PCR NEGATIVE NEGATIVE Final    Comment: (NOTE) Fact Sheet for Patients: BloggerCourse.com  Fact Sheet for Healthcare Providers: SeriousBroker.it  This test is not yet approved or cleared by the [place] FDA and has been authorized for detection and/or diagnosis of SARS-CoV-2 by FDA under an Emergency Use Authorization (EUA). This EUA will remain in effect (meaning this test can be used) for the duration of the COVID-19 declaration under Section 564(b)(1) of the Act, 21 U.S.C. section 360bbb-3(b)(1), unless the authorization is terminated or revoked.  Performed at [facility], [address], [place], [place] [address]      Time coordinating discharge: Over 35 minutes  SIGNED:   Marinda Elk, MD  [facility] 10/09/2023, 9:07 AM Pager   If 7PM-7AM, please contact night-coverage [email or web address] Password [email or web address]

## 2023-10-09 NOTE — Progress Notes (Signed)
Mobility Specialist - Progress Note   10/09/23 0950  Mobility  Activity Ambulated with assistance in hallway  Level of Assistance Independent after set-up  Assistive Device Cane  Distance Ambulated (ft) 500 ft  Range of Motion/Exercises Active  Activity Response Tolerated well  Mobility Referral Yes  $Mobility charge 1 Mobility  Mobility Specialist Start Time (ACUTE ONLY) [id number]  Mobility Specialist Stop Time (ACUTE ONLY) 0929  Mobility Specialist Time Calculation (min) (ACUTE ONLY) 8 min   Received in bed and agreed to mobility. Had no issues throughout session. Returned to bed with all needs met. Staff in room.  Marilynne Halsted Mobility Specialist

## 2023-10-09 NOTE — TOC Transition Note (Signed)
Transition of Care [facility]) - CM/SW Discharge Note   Patient Details  Name: Joseph Rocha MRN: [id number] Date of Birth: 12/01/1934  [facility] CM/SW Contact:  Lanier Clam, RN Phone Number: 10/09/2023, 9:22 AM   Clinical Narrative:  d/c home no needs or orders.     Final next level of care: Home/Self Care Barriers to Discharge: No Barriers Identified   Patient Goals and CMS Choice      Discharge Placement                         Discharge Plan and Services Additional resources added to the After Visit Summary for                                       Social Determinants of Health (SDOH) Interventions SDOH Screenings   Food Insecurity: No Food Insecurity (10/07/2023)  Housing: Low Risk  (10/07/2023)  Transportation Needs: No Transportation Needs (10/07/2023)  Utilities: Not At Risk (10/07/2023)  Alcohol Screen: Low Risk  (06/08/2022)  Depression (PHQ2-9): Low Risk  (10/07/2023)  Financial Resource Strain: Low Risk  (06/08/2022)  Physical Activity: Inactive (06/08/2022)  Social Connections: Socially Integrated (06/08/2022)  Stress: No Stress Concern Present (06/08/2022)  Tobacco Use: Low Risk  (10/07/2023)     Readmission Risk Interventions     No data to display

## 2023-10-09 NOTE — Progress Notes (Signed)
[facility] PROGRESS NOTE    Progress Note  Joseph Rocha  BMW:[id number] DOB: 25-Jan-1935 DOA: 10/07/2023 PCP: [name], MD     Brief Narrative:   Joseph Rocha is an 88 y.o. male past medical history of DJD, low back pain, diabetes mellitus type 2, history of pituitary tumor s/p resection with muscle cramps loss of appetite, nausea and abnormal lab work of hyponatremia   Assessment/Plan:   Hyponatremia Question adrenal insufficiency. Nephrology was consulted. Will start on IV fluids and resume her home dose of Florinef. Her sodium is improving, further management per renal. Cortisol 1. 4 in the morning, TSH 2.8. He relates he feels better  Hypomagnesemia: Repleted now 2.1.  Muscle cramps: Try to potassium greater than 4 magnesium greater than 2. Holding Crestor.  Nausea and poor appetite: Morning cortisol was 1.4. Question adrenal insufficiency.  Mild rhabdomyolysis: Improving with IV hydration.  Normocytic anemia: Hemoglobin is around 12 no signs of overt bleeding.  Non-insulin dependent type 2 diabetes mellitus (HCC) At home he is on metformin, and in house his blood glucose been relatively well-controlled. Check an A1c.  Hypothyroidism Continue Synthroid.   DVT prophylaxis: lovenox Family Communication:none Status is: Inpatient Remains inpatient appropriate because: Hyponatremia    Code Status:     Code Status Orders  (From admission, onward)           Start     Ordered   10/07/23 2127  Full code  Continuous       Question:  By:  Answer:  Consent: discussion documented in EHR   10/07/23 2128           Code Status History     This patient has a current code status but no historical code status.         IV Access:   Peripheral IV   Procedures and diagnostic studies:   No results found.   Medical Consultants:   None.   Subjective:    Joseph Rocha relates nausea vomiting are better.  Objective:     Vitals:   10/08/23 1017 10/08/23 2002 10/09/23 0500 10/09/23 0503  BP: 94/62 113/72  118/82  Pulse: 79 82  91  Resp: 18 20  19   Temp:  98.8 F (37.1 C)  97.7 F (36.5 C)  TempSrc:  Oral  Oral  SpO2: 99% 99%  99%  Weight:   91.6 kg   Height:       SpO2: 99 %   Intake/Output Summary (Last 24 hours) at 10/09/2023 0824 Last data filed at 10/09/2023 0500 Gross per 24 hour  Intake 2377.72 ml  Output --  Net 2377.72 ml   Filed Weights   10/07/23 2212 10/09/23 0500  Weight: 90 kg 91.6 kg    Exam: General exam: In no acute distress. Respiratory system: Good air movement and clear to auscultation. Cardiovascular system: S1 & S2 heard, RRR. No JVD. Gastrointestinal system: Abdomen is nondistended, soft and nontender.  Extremities: No pedal edema. Skin: No rashes, lesions or ulcers Psychiatry: Judgement and insight appear normal. Mood & affect appropriate.    Data Reviewed:    Labs: Basic Metabolic Panel: Recent Labs  Lab 10/07/23 1400 10/07/23 2038 10/08/23 0147 10/08/23 0845 10/08/23 1334 10/08/23 2039 10/09/23 0135  NA 122*   < > 122* 124* 123* 123* 126*  K 4.0   < > 3.8 3.7 3.7 3.7 3.8  CL 89*   < > 91* 94* 95* 92* 97*  CO2 26   < >  22 24 22 23 22   GLUCOSE 172*   < > 173* 131* 133* 175* 142*  BUN 10   < > 11 10 10 9 10   CREATININE 1.06   < > 1.02 0.86 0.98 1.19 1.02  CALCIUM 9.1   < > 8.4* 8.5* 8.3* 8.4* 8.3*  MG 1.4*  --  2.1  --   --   --   --   PHOS  --   --  2.7  --   --   --   --    < > = values in this interval not displayed.   GFR Estimated Creatinine Clearance: 62.1 mL/min (by C-G formula based on SCr of 1.02 mg/dL). Liver Function Tests: Recent Labs  Lab 10/07/23 1400 10/08/23 0147  AST 38* 34  ALT 28 27  ALKPHOS 64 55  BILITOT 0.7 0.5  PROT 6.8 5.8*  ALBUMIN 4.0 3.4*   Recent Labs  Lab 10/08/23 0147  LIPASE 27   No results for input(s): "AMMONIA" in the last 168 hours. Coagulation profile No results for input(s): "INR",  "PROTIME" in the last 168 hours. COVID-19 Labs  No results for input(s): "DDIMER", "FERRITIN", "LDH", "CRP" in the last 72 hours.  Lab Results  Component Value Date   SARSCOV2NAA NEGATIVE 10/07/2023   SARSCOV2NAA NEGATIVE 03/25/2023   SARSCOV2NAA POSITIVE (A) 07/31/2022   SARSCOV2NAA Not Detected 08/29/2019    CBC: Recent Labs  Lab 10/07/23 1400 10/07/23 2038 10/08/23 0147 10/09/23 0135  WBC 7.6 5.8 5.3 5.1  NEUTROABS 4.7  --   --   --   HGB 13.7 12.7* 11.8* 11.8*  HCT 38.9* 36.8* 33.3* 34.8*  MCV 93.0 91.8 91.2 93.3  PLT 233.0 191 182 190   Cardiac Enzymes: Recent Labs  Lab 10/07/23 1400 10/08/23 0147  CKTOTAL 722* 587*   BNP (last 3 results) No results for input(s): "PROBNP" in the last 8760 hours. CBG: Recent Labs  Lab 10/08/23 0745 10/08/23 1138 10/08/23 1621 10/08/23 2005 10/09/23 0745  GLUCAP 100* 148* 122* 179* 123*   D-Dimer: No results for input(s): "DDIMER" in the last 72 hours. Hgb A1c: No results for input(s): "HGBA1C" in the last 72 hours. Lipid Profile: No results for input(s): "CHOL", "HDL", "LDLCALC", "TRIG", "CHOLHDL", "LDLDIRECT" in the last 72 hours. Thyroid function studies: Recent Labs    10/07/23 1400  TSH 2.86   Anemia work up: No results for input(s): "VITAMINB12", "FOLATE", "FERRITIN", "TIBC", "IRON", "RETICCTPCT" in the last 72 hours. Sepsis Labs: Recent Labs  Lab 10/07/23 1400 10/07/23 2038 10/08/23 0147 10/09/23 0135  WBC 7.6 5.8 5.3 5.1   Microbiology Recent Results (from the past 240 hour(s))  Resp panel by RT-PCR (RSV, Flu A&B, Covid) Anterior Nasal Swab     Status: None   Collection Time: 10/07/23 11:07 PM   Specimen: Anterior Nasal Swab  Result Value Ref Range Status   SARS Coronavirus 2 by RT PCR NEGATIVE NEGATIVE Final    Comment: (NOTE) SARS-CoV-2 target nucleic acids are NOT DETECTED.  The SARS-CoV-2 RNA is generally detectable in upper respiratory specimens during the acute phase of infection. The  lowest concentration of SARS-CoV-2 viral copies this assay can detect is 138 copies/mL. A negative result does not preclude SARS-Cov-2 infection and should not be used as the sole basis for treatment or other patient management decisions. A negative result may occur with  improper specimen collection/handling, submission of specimen other than nasopharyngeal swab, presence of viral mutation(s) within the areas targeted by this assay, and  inadequate number of viral copies(<138 copies/mL). A negative result must be combined with clinical observations, patient history, and epidemiological information. The expected result is Negative.  Fact Sheet for Patients:  BloggerCourse.com  Fact Sheet for Healthcare Providers:  SeriousBroker.it  This test is no t yet approved or cleared by the [place] FDA and  has been authorized for detection and/or diagnosis of SARS-CoV-2 by FDA under an Emergency Use Authorization (EUA). This EUA will remain  in effect (meaning this test can be used) for the duration of the COVID-19 declaration under Section 564(b)(1) of the Act, 21 U.S.C.section 360bbb-3(b)(1), unless the authorization is terminated  or revoked sooner.       Influenza A by PCR NEGATIVE NEGATIVE Final   Influenza B by PCR NEGATIVE NEGATIVE Final    Comment: (NOTE) The Xpert Xpress SARS-CoV-2/FLU/RSV plus assay is intended as an aid in the diagnosis of influenza from Nasopharyngeal swab specimens and should not be used as a sole basis for treatment. Nasal washings and aspirates are unacceptable for Xpert Xpress SARS-CoV-2/FLU/RSV testing.  Fact Sheet for Patients: BloggerCourse.com  Fact Sheet for Healthcare Providers: SeriousBroker.it  This test is not yet approved or cleared by the [place] FDA and has been authorized for detection and/or diagnosis of SARS-CoV-2 by FDA under  an Emergency Use Authorization (EUA). This EUA will remain in effect (meaning this test can be used) for the duration of the COVID-19 declaration under Section 564(b)(1) of the Act, 21 U.S.C. section 360bbb-3(b)(1), unless the authorization is terminated or revoked.     Resp Syncytial Virus by PCR NEGATIVE NEGATIVE Final    Comment: (NOTE) Fact Sheet for Patients: BloggerCourse.com  Fact Sheet for Healthcare Providers: SeriousBroker.it  This test is not yet approved or cleared by the [place] FDA and has been authorized for detection and/or diagnosis of SARS-CoV-2 by FDA under an Emergency Use Authorization (EUA). This EUA will remain in effect (meaning this test can be used) for the duration of the COVID-19 declaration under Section 564(b)(1) of the Act, 21 U.S.C. section 360bbb-3(b)(1), unless the authorization is terminated or revoked.  Performed at [facility], [address], [place], [place] [address]      Medications:    aspirin EC  81 mg Oral Daily   enoxaparin (LOVENOX) injection  40 mg Subcutaneous Q24H   fesoterodine  4 mg Oral Daily   finasteride  5 mg Oral Daily   fludrocortisone  0.1 mg Oral Daily   insulin aspart  0-5 Units Subcutaneous QHS   insulin aspart  0-6 Units Subcutaneous TID WC   levothyroxine  50 mcg Oral Q0600   pantoprazole  40 mg Oral Daily   sodium chloride flush  3 mL Intravenous Q12H   tamsulosin  0.4 mg Oral Daily   Continuous Infusions:    LOS: 2 days   Marinda Elk  [facility]  10/09/2023, 8:24 AM

## 2023-10-10 ENCOUNTER — Telehealth: Payer: Self-pay

## 2023-10-10 NOTE — Transitions of Care (Post Inpatient/ED Visit) (Signed)
10/10/2023  Name: Joseph Rocha MRN: [id number] DOB: [birth year]  Today's TOC FU Call Status: Today's TOC FU Call Status:: Successful TOC FU Call Completed TOC FU Call Complete Date: 10/10/23 Patient's Name and Date of Birth confirmed.  Transition Care Management Follow-up Telephone Call Date of Discharge: 10/09/23 Discharge Facility: [facility]) Type of Discharge: Inpatient Admission Primary Inpatient Discharge Diagnosis:: Hyponatremia How have you been since you were released from the hospital?: Better Any questions or concerns?: No  Items Reviewed: Did you receive and understand the discharge instructions provided?: Yes Dietary orders reviewed?: Yes Type of Diet Ordered::  (Low Sodium/Heart Healthy) Do you have support at home?: Yes People in Home: spouse Name of Support/Comfort Primary Source: Lerone, Chavoya (Spouse)  [phone] (Work Phone)  Medications Reviewed Today: Medications Reviewed Today     Reviewed by Bing Quarry, RN (Case Manager) on 10/10/23 at 1331  Med List Status: <None>   Medication Order Taking? Sig Documenting Provider Last Dose Status Informant  albuterol (VENTOLIN HFA) 108 (90 Base) MCG/ACT inhaler [id number] No Inhale 2 puffs into the lungs every 6 (six) hours as needed for wheezing or shortness of breath.  Patient not taking: Reported on 10/10/2023   [name], MD Not Taking Active   aspirin EC 81 MG tablet [id number] Yes Take 1 tablet (81 mg total) by mouth daily. Swallow whole. [name], MD Taking Active   calcium carbonate (OS-CAL) 600 MG TABS [id number] Yes Take 600 mg by mouth daily. [provider] Taking Active Self  cyclobenzaprine (FLEXERIL) 5 MG tablet [id number] No Take 1 tablet (5 mg total) by mouth 3 (three) times daily as needed.  Patient not taking: Reported on 10/10/2023   [name], MD Not Taking Active   ferrous sulfate 325 (65 FE) MG EC tablet [id number] Yes Take 325 mg by mouth daily. [provider] Taking Active   finasteride (PROSCAR) 5 MG tablet [id number] Yes TAKE 1 TABLET DAILY  Patient taking differently: Take 5 mg by mouth daily.   [name], MD Taking Active   fludrocortisone (FLORINEF) 0.1 MG tablet [id number] Yes Take 1 tablet (100 mcg total) by mouth daily. Marinda Elk, MD Taking Active   fluticasone Aleda Grana) 50 MCG/ACT nasal spray [id number] Yes Place 2 sprays into both nostrils daily. Myrlene Broker, MD Taking Active   KLOR-CON M20 20 MEQ tablet [id number] Yes Take 20 mEq by mouth daily.  [provider] Taking Active Self  levothyroxine (SYNTHROID) 50 MCG tablet [id number] Yes TAKE 1 TABLET DAILY [name], MD Taking Active   loperamide (IMODIUM A-D) 2 MG tablet [id number] Yes Take 1 tablet (2 mg total) by mouth 2 (two) times daily as needed for diarrhea or loose stools. Ernie Avena, MD Taking Active            Med Note Ysidro Evert Oct 10, 2023  1:02 PM) Only as needed for diarrhea  metFORMIN (GLUCOPHAGE) 500 MG tablet [id number] Yes TAKE 1 TABLET TWICE DAILY  WITH MEALS Carlus Pavlov, MD Taking Active            Med Note Ysidro Evert Oct 10, 2023  1:05 PM) Getting 30 day supply via mail via [facility]  Omega-3 Fatty Acids (FISH OIL) 1000 MG CAPS [id number] Yes Take 1 capsule by mouth daily. [provider] Taking Active   omeprazole (PRILOSEC) 40 MG capsule [id number] Yes TAKE 1 CAPSULE DAILY [name], MD  Taking Active   ondansetron (ZOFRAN-ODT) 4 MG disintegrating tablet [id number] No Take 1 tablet (4 mg total) by mouth every 8 (eight) hours as needed for nausea or vomiting.  Patient not taking: Reported on 10/10/2023   [name], MD Not Taking Active   repaglinide (PRANDIN) 2 MG tablet [id number] Yes TAKE 1 TABLET TWICE DAILY  BEFORE MEALS  Patient taking differently: Take 2 mg by mouth 2 (two) times daily before a meal.   [name], MD Taking Active   rosuvastatin (CRESTOR) 5 MG tablet [id number] Yes TAKE 1  TABLET DAILY  Patient taking differently: Take 5 mg by mouth daily.   [name], MD Taking Active   solifenacin (VESICARE) 5 MG tablet [id number] Yes Take 1 tablet (5 mg total) by mouth daily. [name], MD Taking Active   Tamsulosin HCl (FLOMAX) 0.4 MG CAPS [id number] Yes Take 0.4 mg by mouth daily. [provider] Taking Active Self  Testosterone 30 MG/ACT SOLN [id number] Yes Place 2 Pump onto the skin daily. [provider] Taking Active   traMADol (ULTRAM) 50 MG tablet [id number] Yes Take 1 tablet (50 mg total) by mouth every 6 (six) hours as needed. [name], MD Taking Active             Home Care and Equipment/Supplies: Were Home Health Services Ordered?: No Any new equipment or medical supplies ordered?: No  Functional Questionnaire: Do you need assistance with bathing/showering or dressing?: No Do you need assistance with meal preparation?: No Do you need assistance with eating?: No Do you have difficulty maintaining continence: No Do you need assistance with getting out of bed/getting out of a chair/moving?: No Do you have difficulty managing or taking your medications?: No  Follow up appointments reviewed: PCP Follow-up appointment confirmed?: No (Will call PCP to set up post discharge follow up visit.) MD Provider Line [phone] Given: No Date of PCP follow-up appointment?:  (Has a scheduled appt in 2/25 but will call to schedule post discharge follow up appt.) Follow-up Provider: Eligha Bridegroom Follow-up appointment confirmed?: No (Patient has a new patient appointment with Dr Roanna Raider, Endocrinologist for 11/27/23. Will call to see if that can be bumped earlier d/t recent hospitalization & referral. Pt. t will also try to schedule appt with a nephrologist or ask for new ref.) Reason Specialist Follow-Up Not Confirmed: Patient has Specialist Provider Number and will Call for Appointment Do you need transportation to your  follow-up appointment?: No Do you understand care options if your condition(s) worsen?: Yes-patient verbalized understanding  SDOH Interventions Today    Flowsheet Row Most Recent Value  SDOH Interventions   Food Insecurity Interventions Intervention Not Indicated  Housing Interventions Intervention Not Indicated  Transportation Interventions Intervention Not Indicated  Utilities Interventions Intervention Not Indicated  Health Literacy Interventions Intervention Not Indicated      10/10/23: TOC RN CM spoke with patient regarding recent discharge. Reviewed discharge instructions, AVS, medication review, allergy review, Medications to stop taking, follow up appointments, specialist appointments, need to keep taking all medications especially Florinef to help prevent low sodium. Patient had not been taking per hospital notes and admitted with severe hyponatremia. Patient verbalized understanding of this and need to secure post discharge follow up appointments with PCP and specialist providers as instructed in discharge.  Patient appreciate of call and review of self care and appointment issues but declined schedule calls for 4 weeks.    Gabriel Cirri MSN, RN [facility]  [facility] Care Management Coordinator [email or web address] Direct Dial: [phone] Fax: [phone]

## 2023-10-14 DIAGNOSIS — R948 Abnormal results of function studies of other organs and systems: Secondary | ICD-10-CM | POA: Diagnosis not present

## 2023-10-14 DIAGNOSIS — E291 Testicular hypofunction: Secondary | ICD-10-CM | POA: Diagnosis not present

## 2023-10-15 ENCOUNTER — Ambulatory Visit: Payer: Medicare Other | Admitting: Internal Medicine

## 2023-10-15 ENCOUNTER — Encounter: Payer: Self-pay | Admitting: Internal Medicine

## 2023-10-15 VITALS — BP 120/68 | HR 75 | Temp 98.1°F | Ht 75.0 in | Wt 206.0 lb

## 2023-10-15 DIAGNOSIS — R748 Abnormal levels of other serum enzymes: Secondary | ICD-10-CM | POA: Diagnosis not present

## 2023-10-15 DIAGNOSIS — E871 Hypo-osmolality and hyponatremia: Secondary | ICD-10-CM

## 2023-10-15 DIAGNOSIS — E119 Type 2 diabetes mellitus without complications: Secondary | ICD-10-CM

## 2023-10-15 DIAGNOSIS — M26622 Arthralgia of left temporomandibular joint: Secondary | ICD-10-CM | POA: Diagnosis not present

## 2023-10-15 DIAGNOSIS — J309 Allergic rhinitis, unspecified: Secondary | ICD-10-CM | POA: Diagnosis not present

## 2023-10-15 LAB — HEPATIC FUNCTION PANEL
ALT: 22 U/L (ref 0–53)
AST: 23 U/L (ref 0–37)
Albumin: 3.7 g/dL (ref 3.5–5.2)
Alkaline Phosphatase: 53 U/L (ref 39–117)
Bilirubin, Direct: 0.2 mg/dL (ref 0.0–0.3)
Total Bilirubin: 0.7 mg/dL (ref 0.2–1.2)
Total Protein: 6.6 g/dL (ref 6.0–8.3)

## 2023-10-15 LAB — BASIC METABOLIC PANEL
BUN: 17 mg/dL (ref 6–23)
CO2: 29 meq/L (ref 19–32)
Calcium: 9.1 mg/dL (ref 8.4–10.5)
Chloride: 93 meq/L — ABNORMAL LOW (ref 96–112)
Creatinine, Ser: 1.08 mg/dL (ref 0.40–1.50)
GFR: 62.05 mL/min (ref >60.00)
Glucose, Bld: 98 mg/dL (ref 70–99)
Potassium: 4 meq/L (ref 3.5–5.1)
Sodium: 130 meq/L — ABNORMAL LOW (ref 135–145)

## 2023-10-15 LAB — CBC WITH DIFFERENTIAL/PLATELET
Basophils Absolute: 0.1 10*3/uL (ref 0.0–0.1)
Basophils Relative: 1.2 % (ref 0.0–3.0)
Eosinophils Absolute: 0.3 10*3/uL (ref 0.0–0.7)
Eosinophils Relative: 5.4 % — ABNORMAL HIGH (ref 0.0–5.0)
HCT: 36.8 % — ABNORMAL LOW (ref 39.0–52.0)
Hemoglobin: 12.6 g/dL — ABNORMAL LOW (ref 13.0–17.0)
Lymphocytes Relative: 32.4 % (ref 12.0–46.0)
Lymphs Abs: 2 10*3/uL (ref 0.7–4.0)
MCHC: 34.1 g/dL (ref 30.0–36.0)
MCV: 94.1 fL (ref 78.0–100.0)
Monocytes Absolute: 0.8 10*3/uL (ref 0.1–1.0)
Monocytes Relative: 13 % — ABNORMAL HIGH (ref 3.0–12.0)
Neutro Abs: 2.9 10*3/uL (ref 1.4–7.7)
Neutrophils Relative %: 48 % (ref 43.0–77.0)
Platelets: 267 10*3/uL (ref 150.0–400.0)
RBC: 3.91 Mil/uL — ABNORMAL LOW (ref 4.22–5.81)
RDW: 13.4 % (ref 11.5–15.5)
WBC: 6.1 10*3/uL (ref 4.0–10.5)

## 2023-10-15 LAB — MAGNESIUM: Magnesium: 1.1 mg/dL — ABNORMAL LOW (ref 1.5–2.5)

## 2023-10-15 LAB — CK: Total CK: 240 U/L — ABNORMAL HIGH (ref 7–232)

## 2023-10-15 MED ORDER — TRAMADOL HCL 50 MG PO TABS: 50.0000 mg | ORAL_TABLET | Freq: Four times a day (QID) | ORAL | 2 refills | Status: DC | PRN

## 2023-10-15 MED ORDER — MELOXICAM 7.5 MG PO TABS: 7.5000 mg | ORAL_TABLET | Freq: Every day | ORAL | 1 refills | Status: AC | PRN

## 2023-10-15 MED ORDER — CETIRIZINE HCL 10 MG PO TABS: 10.0000 mg | ORAL_TABLET | Freq: Every day | ORAL | 11 refills | Status: AC

## 2023-10-15 NOTE — Assessment & Plan Note (Signed)
Mild to mod, for mobic prn, also tramadol prn more severe pain,  to f/u any worsening symptoms or concerns

## 2023-10-15 NOTE — Assessment & Plan Note (Signed)
Lab Results  Component Value Date   HGBA1C 7.9 (H) 10/09/2023   Mild uncontrolled, pt to continue current medical treatment metfomrin 500 bid, prandin 2 mg bid, and f/u endo jan 22 as planned

## 2023-10-15 NOTE — Assessment & Plan Note (Signed)
New mild recent CK - for f/u lab today

## 2023-10-15 NOTE — Assessment & Plan Note (Signed)
 Mild to mod, for restart zyrtec 10 every day prn,,  to f/u any worsening symptoms or concerns

## 2023-10-15 NOTE — Assessment & Plan Note (Signed)
Improving at d/c, pt to continue florinef, for f/u bmp

## 2023-10-15 NOTE — Assessment & Plan Note (Signed)
Low recent s/p supplementation - for f/u mag today

## 2023-10-15 NOTE — Progress Notes (Signed)
Patient ID: JACORION SUMA, male   DOB: 01-Jan-1935, 88 y.o.   MRN: [id number]        Chief Complaint: follow up recent hospn Dec 2 - 4 2024 with low sodium , muscle cramping, elevated CK, low magnesium, florinef non compliance, DM, and today new right TMJ pain       HPI:  SAHEJ AMBERSON is a 88 y.o. male here with c/o mild recurring right TMJ pain with opening mouth with limited opening comes and goes, o/w nothing seems to make better or worse. Does not want to see Dr Lafe Garin any longer, plans to see wifes endo, had to cancel nov appt as was out of town, but plans to now keep the appt for Jan 22.  Had lab work yesterday and f/u urology planned for next wk.   Does have several days ongoing nasal allergy symptoms with clearish congestion, itch and sneezing, without fever, pain, ST, cough, swelling or wheezing, but told to stop the zyrtec and now nose running all the time.         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 He has never used  smokeless tobacco. He reports that he does not drink alcohol and does not use drugs. family history includes Cancer in his brother; Diabetes Mellitus II in his mother; Healthy in his daughter; Heart attack in his father. Allergies  Allergen Reactions   Lipitor [Atorvastatin] Other (See Comments)    Myalgias, cramps in hand   Current Outpatient Medications on File Prior to Visit  Medication Sig Dispense Refill   aspirin EC 81 MG tablet Take 1 tablet (81 mg total) by mouth daily. Swallow whole. 30 tablet 11   calcium carbonate (OS-CAL) 600 MG TABS Take 600 mg by mouth daily.     ferrous sulfate 325 (65 FE) MG EC tablet Take 325 mg by mouth daily.     finasteride (PROSCAR) 5 MG tablet TAKE 1 TABLET DAILY (Patient taking differently: Take 5 mg by mouth daily.) 90 tablet 1   fludrocortisone (FLORINEF) 0.1 MG tablet Take 1 tablet (100 mcg total) by mouth daily. 90 tablet 1   fluticasone (FLONASE) 50 MCG/ACT nasal spray Place 2 sprays into both nostrils daily. 16 g 6   KLOR-CON M20 20 MEQ tablet Take 20 mEq by mouth daily.      levothyroxine (SYNTHROID) 50 MCG  tablet TAKE 1 TABLET DAILY 90 tablet 2   loperamide (IMODIUM A-D) 2 MG tablet Take 1 tablet (2 mg total) by mouth 2 (two) times daily as needed for diarrhea or loose stools. 30 tablet 3   metFORMIN (GLUCOPHAGE) 500 MG tablet TAKE 1 TABLET TWICE DAILY  WITH MEALS 60 tablet 0   Omega-3 Fatty Acids (FISH OIL) 1000 MG CAPS Take 1 capsule by mouth daily.     omeprazole (PRILOSEC) 40 MG capsule TAKE 1 CAPSULE DAILY 90 capsule 2   repaglinide (PRANDIN) 2 MG tablet TAKE 1 TABLET TWICE DAILY  BEFORE MEALS (Patient taking differently: Take 2 mg by mouth 2 (two) times daily before a meal.) 180 tablet 3   rosuvastatin (CRESTOR) 5 MG tablet TAKE 1 TABLET DAILY (Patient taking differently: Take 5 mg by mouth daily.) 90 tablet 3   solifenacin (VESICARE) 5 MG tablet Take 1 tablet (5 mg total) by mouth daily. 90 tablet 3   Tamsulosin HCl (FLOMAX) 0.4 MG CAPS  Take 0.4 mg by mouth daily.     Testosterone 30 MG/ACT SOLN Place 2 Pump onto the skin daily.     albuterol (VENTOLIN HFA) 108 (90 Base) MCG/ACT inhaler Inhale 2 puffs into the lungs every 6 (six) hours as needed for wheezing or shortness of breath. (Patient not taking: Reported on 10/10/2023) 8 g 0   cyclobenzaprine (FLEXERIL) 5 MG tablet Take 1 tablet (5 mg total) by mouth 3 (three) times daily as needed. (Patient not taking: Reported on 10/10/2023) 40 tablet 2   ondansetron (ZOFRAN-ODT) 4 MG disintegrating tablet Take 1 tablet (4 mg total) by mouth every 8 (eight) hours as needed for nausea or vomiting. (Patient not taking: Reported on 10/10/2023) 20 tablet 0   No current facility-administered medications on file prior to visit.        ROS:  All others reviewed and negative.  Objective        PE:  BP 120/68 (BP Location: Right Arm, Patient Position: Sitting, Cuff Size: Normal)   Pulse 75   Temp 98.1 F (36.7 C) (Oral)   Ht 6\' 3"  (1.905 m)   Wt 206 lb (93.4 kg)   SpO2 98%   BMI 25.75 kg/m                 Constitutional: Pt appears in NAD               HENT: Head: NCAT.                Right Ear: External ear normal.                 Left Ear: External ear normal.                Eyes: . Pupils are equal, round, and reactive to light. Conjunctivae and EOM are normal               Nose: without d/c or deformity               Neck: Neck supple. Gross normal ROM               Cardiovascular: Normal rate and regular rhythm.                 Pulmonary/Chest: Effort normal and breath sounds without rales or wheezing.                Abd:  Soft, NT, ND, + BS, no organomegaly  Neurological: Pt is alert. At baseline orientation, motor grossly intact               Skin: Skin is warm. No rashes, no other new lesions, LE edema - none               Psychiatric: Pt behavior is normal without agitation   Micro: none  Cardiac tracings I have personally interpreted today:   none  Pertinent Radiological findings (summarize): none   Lab Results  Component Value Date   WBC 5.1 10/09/2023   HGB 11.8 (L) 10/09/2023   HCT 34.8 (L) 10/09/2023   PLT 190 10/09/2023   GLUCOSE 142 (H) 10/09/2023   CHOL 196 07/17/2023   TRIG 114.0 07/17/2023   HDL 57.60 07/17/2023   LDLCALC 116 (H) 07/17/2023   ALT 27 10/08/2023   AST 34 10/08/2023   NA 126 (L) 10/09/2023   K 3.8 10/09/2023   CL 97 (L) 10/09/2023   CREATININE 1.02 10/09/2023   BUN 10 10/09/2023   CO2 22 10/09/2023   TSH 2.86 10/07/2023   PSA 3.04 07/07/2014   HGBA1C 7.9 (H) 10/09/2023   MICROALBUR 0.7 07/17/2023   Assessment/Plan:  TEMUR SCHU is a 88 y.o. Black or African American [2] male with  has a past medical history of Allergy, Anemia, Blood transfusion without reported diagnosis, Diabetes mellitus, Elevated PSA, Hyperlipidemia, Hypertension, Hypogonadism male, OA (osteoarthritis), and Pituitary abnormality (HCC) (11/05/2002).  Hyponatremia Improving at d/c, pt to continue florinef, for f/u bmp  Hypomagnesemia Low recent s/p supplementation - for f/u mag today  Elevated CK New mild recent CK - for f/u lab today  Non-insulin dependent type 2 diabetes mellitus (HCC) Lab Results  Component Value Date   HGBA1C 7.9 (H) 10/09/2023   Mild uncontrolled, pt to continue current medical treatment metfomrin 500 bid, prandin 2 mg bid, and f/u endo jan 22 as planned   Allergic rhinitis Mild to mod, for restart zyrtec 10 every day prn,,  to f/u any worsening symptoms or concerns  TMJ arthralgia Mild to mod, for mobic prn, also tramadol prn more severe pain,  to f/u any worsening symptoms or concerns  Followup: Return in about 4 months (around 02/13/2024).  Oliver Barre, MD 10/15/2023 8:22 PM [facility]

## 2023-10-15 NOTE — Patient Instructions (Signed)
Ok to restart the zyrtec for allergies, the meloxicam as needed for pain, and tramadol as needed for more severe pain  Please continue all other medications as before, including the fludrocortisone (florinef) that helps you sodium  Please have the pharmacy call with any other refills you may need.  Please continue your efforts at being more active, low cholesterol diet, and weight control.  Please keep your appointments with your specialists as you may have planned - Endocrinology jan 22   You should not need to see Nephrology (kidney) doctor since you do NOT have CKD3  Please go to the LAB at the blood drawing area for the tests to be done  You will be contacted by phone if any changes need to be made immediately.  Otherwise, you will receive a letter about your results with an explanation, but please check with MyChart first.  Please make an Appointment to return in 4 months, or sooner if needed

## 2023-10-16 ENCOUNTER — Telehealth: Payer: Self-pay

## 2023-10-16 ENCOUNTER — Other Ambulatory Visit: Payer: Self-pay | Admitting: Internal Medicine

## 2023-10-16 ENCOUNTER — Encounter: Payer: Self-pay | Admitting: Internal Medicine

## 2023-10-16 MED ORDER — MAGNESIUM OXIDE 400 MG PO CAPS: ORAL_CAPSULE | ORAL | 5 refills | Status: DC

## 2023-10-16 NOTE — Telephone Encounter (Signed)
VM left informing pt to CB in regards to labs

## 2023-10-18 ENCOUNTER — Other Ambulatory Visit: Payer: Self-pay | Admitting: Internal Medicine

## 2023-10-18 DIAGNOSIS — E1165 Type 2 diabetes mellitus with hyperglycemia: Secondary | ICD-10-CM

## 2023-10-21 ENCOUNTER — Telehealth: Payer: Self-pay | Admitting: Gastroenterology

## 2023-10-21 DIAGNOSIS — E291 Testicular hypofunction: Secondary | ICD-10-CM | POA: Diagnosis not present

## 2023-10-21 DIAGNOSIS — R3912 Poor urinary stream: Secondary | ICD-10-CM | POA: Diagnosis not present

## 2023-10-21 DIAGNOSIS — N401 Enlarged prostate with lower urinary tract symptoms: Secondary | ICD-10-CM | POA: Diagnosis not present

## 2023-10-21 NOTE — Telephone Encounter (Signed)
Patient is calling back to follow up on previous message requesting a call back.

## 2023-10-21 NOTE — Telephone Encounter (Signed)
Patient spouse requesting f/u call from nurse to discuss patient symptoms over the weekend. Please advise.   Thank you

## 2023-10-22 NOTE — Telephone Encounter (Signed)
Line rings no answer no voice mail  °

## 2023-10-23 NOTE — Telephone Encounter (Signed)
I spoke with the pt wife and made her aware that the pt has the soonest appt available- 10/28/23 with Hyacinth Meeker PA.  The pt was last seen 06/2022.  She states the pt has problems with keeping his food down.  I explained that I can not offer much advise over the phone without knowing what is causing his symptoms and having not being seen in over a year.  I did make her aware that if his complaints are severe or he does not feel like he can wait until Monday he could go to the ED or UC or call his PCP. She thanked me for calling and will keep appt for Monday.

## 2023-10-23 NOTE — Telephone Encounter (Signed)
Line rings no answer  

## 2023-10-28 ENCOUNTER — Other Ambulatory Visit (INDEPENDENT_AMBULATORY_CARE_PROVIDER_SITE_OTHER): Payer: Medicare Other

## 2023-10-28 ENCOUNTER — Ambulatory Visit (INDEPENDENT_AMBULATORY_CARE_PROVIDER_SITE_OTHER): Payer: Medicare Other | Admitting: Physician Assistant

## 2023-10-28 ENCOUNTER — Encounter: Payer: Self-pay | Admitting: Physician Assistant

## 2023-10-28 VITALS — BP 100/70 | HR 85 | Ht 75.0 in | Wt 196.0 lb

## 2023-10-28 DIAGNOSIS — K5900 Constipation, unspecified: Secondary | ICD-10-CM | POA: Diagnosis not present

## 2023-10-28 DIAGNOSIS — R634 Abnormal weight loss: Secondary | ICD-10-CM

## 2023-10-28 DIAGNOSIS — R112 Nausea with vomiting, unspecified: Secondary | ICD-10-CM | POA: Diagnosis not present

## 2023-10-28 LAB — CBC WITH DIFFERENTIAL/PLATELET
Basophils Absolute: 0.1 10*3/uL (ref 0.0–0.1)
Basophils Relative: 1 % (ref 0.0–3.0)
Eosinophils Absolute: 0.3 10*3/uL (ref 0.0–0.7)
Eosinophils Relative: 3.8 % (ref 0.0–5.0)
HCT: 37.7 % — ABNORMAL LOW (ref 39.0–52.0)
Hemoglobin: 12.7 g/dL — ABNORMAL LOW (ref 13.0–17.0)
Lymphocytes Relative: 31.8 % (ref 12.0–46.0)
Lymphs Abs: 2.1 10*3/uL (ref 0.7–4.0)
MCHC: 33.6 g/dL (ref 30.0–36.0)
MCV: 94.9 fL (ref 78.0–100.0)
Monocytes Absolute: 0.7 10*3/uL (ref 0.1–1.0)
Monocytes Relative: 11.1 % (ref 3.0–12.0)
Neutro Abs: 3.5 10*3/uL (ref 1.4–7.7)
Neutrophils Relative %: 52.3 % (ref 43.0–77.0)
Platelets: 226 10*3/uL (ref 150.0–400.0)
RBC: 3.98 Mil/uL — ABNORMAL LOW (ref 4.22–5.81)
RDW: 13.4 % (ref 11.5–15.5)
WBC: 6.7 10*3/uL (ref 4.0–10.5)

## 2023-10-28 LAB — COMPREHENSIVE METABOLIC PANEL
ALT: 12 U/L (ref 0–53)
AST: 17 U/L (ref 0–37)
Albumin: 4 g/dL (ref 3.5–5.2)
Alkaline Phosphatase: 51 U/L (ref 39–117)
BUN: 22 mg/dL (ref 6–23)
CO2: 26 meq/L (ref 19–32)
Calcium: 9.1 mg/dL (ref 8.4–10.5)
Chloride: 99 meq/L (ref 96–112)
Creatinine, Ser: 1.2 mg/dL (ref 0.40–1.50)
GFR: 54.67 mL/min — ABNORMAL LOW (ref >60.00)
Glucose, Bld: 153 mg/dL — ABNORMAL HIGH (ref 70–99)
Potassium: 4.2 meq/L (ref 3.5–5.1)
Sodium: 133 meq/L — ABNORMAL LOW (ref 135–145)
Total Bilirubin: 0.7 mg/dL (ref 0.2–1.2)
Total Protein: 6.7 g/dL (ref 6.0–8.3)

## 2023-10-28 LAB — LIPASE: Lipase: 14 U/L (ref 11.0–59.0)

## 2023-10-28 MED ORDER — ONDANSETRON 4 MG PO TBDP: 4.0000 mg | ORAL_TABLET | Freq: Three times a day (TID) | ORAL | 1 refills | Status: AC | PRN

## 2023-10-28 NOTE — Progress Notes (Signed)
Chief Complaint: Nausea and vomiting  HPI:    [name] is an 88 year old male with a past medical history as listed below including hypertension and osteoarthritis, known to Dr. Russella Rocha, who was referred to me by Joseph Levins, MD for a complaint of nausea and vomiting.      07/04/2022 patient seen in clinic by Dr. Russella Rocha and at that time his symptoms of SIBO had resolved.  He was continued on Meprazole 40 mg daily.    10/07/2023-10/09/2023 patient admitted to the hospital for hyponatremia.    10/15/2023 hepatic function panel normal, CBC with a hemoglobin of 12.6.  Around patient's baseline.  BMP with a sodium low at 130 and chloride low at 93 and otherwise normal.    Today, patient presents to clinic and tells me for the past few weeks he has had issues with vomiting.  Apparently on 10/16/2023 he ate something and vomited and this continued for the next week and now is off and on, he cannot see that it is related to food every time, sometimes it is many hours after he has eaten.  Along with this a decrease in appetite and weight loss of around 10 pounds.  Has also noticed that his bowel movements have slowed.  He is no longer using MiraLAX daily.  Continues on Omeprazole 40 mg daily.    Denies fever, chills, blood in his stools or change in bowel habits.  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    Past Surgical History:  Procedure Laterality Date   DOPPLER ECHOCARDIOGRAPHY  12/04/2001   ELECTROCARDIOGRAM  03/25/2006   EP IMPLANTABLE DEVICE N/A 03/16/2016   Procedure: Loop Recorder Insertion;  Surgeon: Duke Salvia, MD;  Location: [facility];  Service: Cardiovascular;  Laterality: N/A;   KNEE ARTHROSCOPY Left    LUMBAR DISC SURGERY     MENISCUS REPAIR Right  may 2015   TONSILLECTOMY     TRANSPHENOIDAL / TRANSNASAL HYPOPHYSECTOMY / RESECTION PITUITARY TUMOR  2005    Current Outpatient Medications  Medication Sig Dispense Refill   albuterol (VENTOLIN HFA) 108 (90 Base) MCG/ACT inhaler Inhale 2 puffs into the lungs every 6 (six) hours as needed for wheezing or shortness of breath. (Patient not taking: Reported on 10/10/2023) 8 g 0   aspirin EC 81 MG tablet Take 1 tablet (81 mg total) by mouth daily. Swallow whole. 30 tablet 11   calcium carbonate (OS-CAL) 600 MG TABS Take 600 mg by mouth daily.     cetirizine (ZYRTEC) 10 MG tablet Take 1 tablet (10 mg total) by mouth daily. 30 tablet 11   cyclobenzaprine (FLEXERIL) 5 MG tablet Take 1 tablet (5 mg total) by mouth 3 (three) times daily as needed. (Patient not taking: Reported on 10/10/2023) 40 tablet 2   ferrous sulfate 325 (65 FE) MG EC tablet Take 325 mg by mouth daily.     finasteride (PROSCAR) 5 MG tablet TAKE 1 TABLET DAILY (Patient taking differently: Take 5 mg by mouth daily.) 90 tablet 1   fludrocortisone (FLORINEF) 0.1 MG tablet Take 1 tablet (100 mcg total) by mouth daily. 90 tablet 1   fluticasone (FLONASE) 50 MCG/ACT nasal spray Place 2 sprays into both  nostrils daily. 16 g 6   KLOR-CON M20 20 MEQ tablet Take 20 mEq by mouth daily.      levothyroxine (SYNTHROID) 50 MCG tablet TAKE 1 TABLET DAILY 90 tablet 2   loperamide (IMODIUM A-D) 2 MG tablet Take 1 tablet (2 mg total) by mouth 2 (two) times daily as needed for diarrhea or loose stools. 30 tablet 3   Magnesium Oxide 400 MG CAPS Take 1 tab by mouth once daily 30 capsule 5   meloxicam (MOBIC) 7.5 MG tablet Take 1 tablet (7.5 mg total) by mouth daily as needed for pain. 90 tablet 1   metFORMIN (GLUCOPHAGE) 500 MG tablet TAKE 1 TABLET TWICE DAILY  WITH MEALS 60 tablet 0   Omega-3 Fatty Acids (FISH OIL) 1000 MG CAPS Take 1 capsule by mouth daily.     omeprazole (PRILOSEC) 40 MG capsule TAKE 1 CAPSULE DAILY 90 capsule 2   ondansetron  (ZOFRAN-ODT) 4 MG disintegrating tablet Take 1 tablet (4 mg total) by mouth every 8 (eight) hours as needed for nausea or vomiting. (Patient not taking: Reported on 10/10/2023) 20 tablet 0   repaglinide (PRANDIN) 2 MG tablet TAKE 1 TABLET TWICE DAILY  BEFORE MEALS (Patient taking differently: Take 2 mg by mouth 2 (two) times daily before a meal.) 180 tablet 3   rosuvastatin (CRESTOR) 5 MG tablet TAKE 1 TABLET DAILY (Patient taking differently: Take 5 mg by mouth daily.) 90 tablet 3   solifenacin (VESICARE) 5 MG tablet Take 1 tablet (5 mg total) by mouth daily. 90 tablet 3   Tamsulosin HCl (FLOMAX) 0.4 MG CAPS Take 0.4 mg by mouth daily.     Testosterone 30 MG/ACT SOLN Place 2 Pump onto the skin daily.     traMADol (ULTRAM) 50 MG tablet Take 1 tablet (50 mg total) by mouth every 6 (six) hours as needed. 30 tablet 2   No current facility-administered medications for this visit.    Allergies as of 10/28/2023 - Review Complete 10/28/2023  Allergen Reaction Noted   Lipitor [atorvastatin] Other (See Comments) 03/02/2014    Family History  Problem Relation Age of Onset   Diabetes Mellitus II Mother        Deceased, 75s   Heart attack Father    Cancer Brother        uncertain type   Healthy Daughter    Colon cancer Neg Hx    Esophageal cancer Neg Hx    Liver cancer Neg Hx    Pancreatic cancer Neg Hx    Stomach cancer Neg Hx     Social History   Socioeconomic History   Marital status: Married    Spouse name: Not on file   Number of children: 2   Years of education: Not on file   Highest education level: Not on file  Occupational History   Occupation: retired    Associate Professor: RETIRED  Tobacco Use   Smoking status: Never   Smokeless tobacco: Never  Vaping Use   Vaping status: Never Used  Substance and Sexual Activity   Alcohol use: No   Drug use: No   Sexual activity: Not Currently  Other Topics Concern   Not on file  Social History Narrative   Lives with wife.  They have two  grown children.   He is retired from the [employer].   Social Drivers of Corporate investment banker Strain: Low Risk  (06/08/2022)   Overall Financial Resource Strain (CARDIA)    Difficulty of Paying Living  Expenses: Not hard at all  Food Insecurity: No Food Insecurity (10/10/2023)   Hunger Vital Sign    Worried About Running Out of Food in the Last Year: Never true    Ran Out of Food in the Last Year: Never true  Transportation Needs: No Transportation Needs (10/10/2023)   PRAPARE - Administrator, Civil Service (Medical): No    Lack of Transportation (Non-Medical): No  Physical Activity: Inactive (06/08/2022)   Exercise Vital Sign    Days of Exercise per Week: 0 days    Minutes of Exercise per Session: 0 min  Stress: No Stress Concern Present (06/08/2022)   [employer] of Occupational Health - Occupational Stress Questionnaire    Feeling of Stress : Not at all  Social Connections: Socially Integrated (06/08/2022)   Social Connection and Isolation Panel [NHANES]    Frequency of Communication with Friends and Family: Three times a week    Frequency of Social Gatherings with Friends and Family: Three times a week    Attends Religious Services: More than 4 times per year    Active Member of Clubs or Organizations: Yes    Attends Banker Meetings: More than 4 times per year    Marital Status: Married  Catering manager Violence: Not At Risk (10/10/2023)   Humiliation, Afraid, Rape, and Kick questionnaire    Fear of Current or Ex-Partner: No    Emotionally Abused: No    Physically Abused: No    Sexually Abused: No    Review of Systems:    Constitutional: No weight loss, fever or chills Skin: No rash  Cardiovascular: No chest pain Respiratory: No SOB  Gastrointestinal: See HPI and otherwise negative Genitourinary: No dysuria  Neurological: No headache, dizziness or syncope Musculoskeletal: No new muscle or joint pain Hematologic: No bleeding   Psychiatric: No history of depression or anxiety   Physical Exam:  Vital signs: BP 100/70 (BP Location: Left Arm, Patient Position: Sitting, Cuff Size: Normal)   Pulse 85   Ht 6\' 3"  (1.905 m)   Wt 196 lb (88.9 kg)   SpO2 95%   BMI 24.50 kg/m    Constitutional:   Pleasant AA male appears to be in NAD, Well developed, Well nourished, alert and cooperative Respiratory: Respirations even and unlabored. Lungs clear to auscultation bilaterally.   No wheezes, crackles, or rhonchi.  Cardiovascular: Normal S1, S2. No MRG. Regular rate and rhythm. No peripheral edema, cyanosis or pallor.  Gastrointestinal:  Soft, nondistended, nontender. No rebound or guarding. Normal bowel sounds. No appreciable masses or hepatomegaly. Rectal:  Not performed.  Psychiatric:  Demonstrates good judgement and reason without abnormal affect or behaviors.  RELEVANT LABS AND IMAGING: CBC    Component Value Date/Time   WBC 6.1 10/15/2023 0901   RBC 3.91 (L) 10/15/2023 0901   HGB 12.6 (L) 10/15/2023 0901   HCT 36.8 (L) 10/15/2023 0901   PLT 267.0 10/15/2023 0901   MCV 94.1 10/15/2023 0901   MCH 31.6 10/09/2023 0135   MCHC 34.1 10/15/2023 0901   RDW 13.4 10/15/2023 0901   LYMPHSABS 2.0 10/15/2023 0901   MONOABS 0.8 10/15/2023 0901   EOSABS 0.3 10/15/2023 0901   BASOSABS 0.1 10/15/2023 0901    CMP     Component Value Date/Time   NA 130 (L) 10/15/2023 0901   K 4.0 10/15/2023 0901   CL 93 (L) 10/15/2023 0901   CO2 29 10/15/2023 0901   GLUCOSE 98 10/15/2023 0901   BUN 17 10/15/2023 0901  CREATININE 1.08 10/15/2023 0901   CREATININE 1.43 (H) 07/13/2020 1107   CALCIUM 9.1 10/15/2023 0901   CALCIUM 9.1 02/25/2008 0340   PROT 6.6 10/15/2023 0901   ALBUMIN 3.7 10/15/2023 0901   AST 23 10/15/2023 0901   ALT 22 10/15/2023 0901   ALKPHOS 53 10/15/2023 0901   BILITOT 0.7 10/15/2023 0901   GFRNONAA >60 10/09/2023 0135   GFRNONAA 45 (L) 07/13/2020 1107   GFRAA 52 (L) 07/13/2020 1107    Assessment: 1.   Nausea and vomiting: Over the past couple of weeks, initially it was every day now seems to come and go; consider gastritis versus constipation versus other 2.  Weight loss: About 10 pounds over the past 2 weeks without really trying but has had a decreased appetite due to symptoms above 3.  Constipation: Over the past few weeks, could be related to decreased intake and nausea or vice versa as he has been off of his MiraLAX  Plan: 1.  Scheduled patient for diagnostic CT of the abdomen pelvis given weight loss and ongoing symptoms.  Patient requested imaging. 2.  Recommend the patient continues Omeprazole 40 mg daily, could consider increasing in the future pending results from CT 3.  Continue Zofran 4 mg as needed, discussed scheduling this out of his vomiting becomes more frequent. 4.  Would recommend the patient go back to using his MiraLAX daily.  Discussed that sometimes constipation can lead to nausea and decreased appetite. 5.  Patient requested to see Dr. Marina Goodell at follow-up and Dr. Marina Goodell only.  He wants to switch his care to him and wants to have a chance to meet him.  Scheduled in March.  This was okayed per Dr. Russella Rocha since he is retiring.  Hyacinth Meeker, PA-C [facility] 10/28/2023, 1:30 PM  Cc: Joseph Levins, MD

## 2023-10-28 NOTE — Patient Instructions (Addendum)
Your provider has requested that you go to the basement level for lab work before leaving today. Press "B" on the elevator. The lab is located at the first door on the left as you exit the elevator.  Miralax 1 capful daily in 8 ounces of liquid.  Continue Zofran daily as needed.  You have been scheduled for a CT scan of the abdomen and pelvis at [facility], 1st floor Radiology. You are scheduled on Friday 11/01/23 at 4:30 pm. Please arrive at  2:15 pm to your appointment time for registration and to drink oral contrast.  Please follow the written instructions below on the day of your exam:   1) Do not eat anything after 12:30 pm (4 hours prior to your test)   You may take any medications as prescribed with a small amount of water, if necessary. If you take any of the following medications: METFORMIN, GLUCOPHAGE, GLUCOVANCE, AVANDAMET, RIOMET, FORTAMET, ACTOPLUS MET, JANUMET, GLUMETZA or METAGLIP, you MAY be asked to HOLD this medication 48 hours AFTER the exam.   The purpose of you drinking the oral contrast is to aid in the visualization of your intestinal tract. The contrast solution may cause some diarrhea. Depending on your individual set of symptoms, you may also receive an intravenous injection of x-ray contrast/dye. Plan on being at [facility] for 45 minutes or longer, depending on the type of exam you are having performed.   If you have any questions regarding your exam or if you need to reschedule, you may call [facility] at [phone] between the hours of 8:00 am and 5:00 pm, Monday-Friday.

## 2023-10-31 NOTE — Progress Notes (Signed)
Noted  

## 2023-11-01 ENCOUNTER — Ambulatory Visit (HOSPITAL_COMMUNITY)
Admission: RE | Admit: 2023-11-01 | Discharge: 2023-11-01 | Disposition: A | Discharge status: Home / Self Care | Payer: Medicare Other | Source: Ambulatory Visit | Attending: Physician Assistant

## 2023-11-01 DIAGNOSIS — N400 Enlarged prostate without lower urinary tract symptoms: Secondary | ICD-10-CM | POA: Diagnosis not present

## 2023-11-01 DIAGNOSIS — K769 Liver disease, unspecified: Secondary | ICD-10-CM | POA: Diagnosis not present

## 2023-11-01 DIAGNOSIS — R634 Abnormal weight loss: Secondary | ICD-10-CM | POA: Diagnosis not present

## 2023-11-01 DIAGNOSIS — K5900 Constipation, unspecified: Secondary | ICD-10-CM | POA: Diagnosis not present

## 2023-11-01 DIAGNOSIS — R112 Nausea with vomiting, unspecified: Secondary | ICD-10-CM

## 2023-11-01 DIAGNOSIS — K5730 Diverticulosis of large intestine without perforation or abscess without bleeding: Secondary | ICD-10-CM | POA: Diagnosis not present

## 2023-11-01 MED ORDER — IOHEXOL 300 MG/ML  SOLN
100.0000 mL | Freq: Once | INTRAMUSCULAR | Status: AC | PRN
  Administered 2023-11-01: 100 mL via INTRAVENOUS

## 2023-11-01 MED ORDER — IOHEXOL 9 MG/ML PO SOLN
1000.0000 mL | Freq: Once | ORAL | Status: AC
  Administered 2023-11-01: 1000 mL via ORAL

## 2023-11-04 ENCOUNTER — Ambulatory Visit: Payer: Self-pay | Admitting: Internal Medicine

## 2023-11-04 NOTE — Telephone Encounter (Signed)
Copied from CRM [id number]. Topic: Clinical - Medical Advice >> Nov 04, 2023  2:35 PM [name] wrote: Reason for CRM: Patient is asking that Dr. Raphael Gibney nurse give him a call back. Patient is still I having loose bowels and irregularity with bowel movements   Chief Complaint: loose stool Symptoms: loss of appetite  Frequency: ongoing since Friday Pertinent Negatives: Patient denies abdominal pain Disposition: [] ED /[] Urgent Care (no appt availability in office) / [x] Appointment(In office/virtual)/ []  [facility]/ [] Home Care/ [] Refused Recommended Disposition /[] [place] [facility]/ []  Follow-up with PCP Additional Notes: The patient reported that he has had loose stool since Friday.  He thought it may be due to what he was given for his CT scan on Friday.  His stool has been very dark brown and extremely watery.  His symptoms are worse after breakfast.  He has had to keep a plastic bag with him while on the toilet as he gets nauseous and vomits.  The nausea has been ongoing.  He would also like to follow up on his CT results.  He was scheduled for a next day appointment.   Reason for Disposition  [1] MODERATE diarrhea (e.g., 4-6 times / day more than normal) AND [2] present > 48 hours (2 days)  Answer Assessment - Initial Assessment Questions 1. DIARRHEA SEVERITY: "How bad is the diarrhea?" "How many more stools have you had in the past 24 hours than normal?"    - NO DIARRHEA (SCALE 0)   - MILD (SCALE 1-3): Few loose or mushy BMs; increase of 1-3 stools over normal daily number of stools; mild increase in ostomy output.   -  MODERATE (SCALE 4-7): Increase of 4-6 stools daily over normal; moderate increase in ostomy output.   -  SEVERE (SCALE 8-10; OR "WORST POSSIBLE"): Increase of 7 or more stools daily over normal; moderate increase in ostomy output; incontinence.     At least twice  2. ONSET: "When did the diarrhea begin?"      Unable to determine  3. BM CONSISTENCY: "How loose  or watery is the diarrhea?"      watery 4. VOMITING: "Are you also vomiting?" If Yes, ask: "How many times in the past 24 hours?"      Not eating as much as usual; feeling nauseous.  Threw up Saturday or Sunday  5. ABDOMEN PAIN: "Are you having any abdomen pain?" If Yes, ask: "What does it feel like?" (e.g., crampy, dull, intermittent, constant)      None  7. ORAL INTAKE: If vomiting, "Have you been able to drink liquids?" "How much liquids have you had in the past 24 hours?"     Able to drink water; the issue is mainly breakfast  8. HYDRATION: "Any signs of dehydration?" (e.g., dry mouth [not just dry lips], too weak to stand, dizziness, new weight loss) "When did you last urinate?"     Able to drink liquids   10. ANTIBIOTIC USE: "Are you taking antibiotics now or have you taken antibiotics in the past 2 months?"       no 11. OTHER SYMPTOMS: "Do you have any other symptoms?" (e.g., fever, blood in stool)       Poor appetite  Nausea and vomiting  Protocols used: Diarrhea-A-AH

## 2023-11-05 ENCOUNTER — Other Ambulatory Visit: Payer: Self-pay

## 2023-11-05 ENCOUNTER — Encounter (HOSPITAL_BASED_OUTPATIENT_CLINIC_OR_DEPARTMENT_OTHER): Payer: Self-pay | Admitting: Emergency Medicine

## 2023-11-05 ENCOUNTER — Encounter: Payer: Self-pay | Admitting: Internal Medicine

## 2023-11-05 ENCOUNTER — Inpatient Hospital Stay (HOSPITAL_BASED_OUTPATIENT_CLINIC_OR_DEPARTMENT_OTHER)
Admission: EM | Admit: 2023-11-05 | Discharge: 2023-11-08 | DRG: 644 | Disposition: A | Discharge status: Home / Self Care | Payer: Medicare Other | Attending: Internal Medicine | Admitting: Internal Medicine

## 2023-11-05 ENCOUNTER — Emergency Department (HOSPITAL_BASED_OUTPATIENT_CLINIC_OR_DEPARTMENT_OTHER): Payer: Medicare Other

## 2023-11-05 ENCOUNTER — Ambulatory Visit: Payer: Medicare Other | Admitting: Internal Medicine

## 2023-11-05 VITALS — BP 126/74 | HR 64 | Temp 97.8°F | Ht 75.0 in | Wt 191.0 lb

## 2023-11-05 DIAGNOSIS — R195 Other fecal abnormalities: Secondary | ICD-10-CM | POA: Diagnosis present

## 2023-11-05 DIAGNOSIS — R55 Syncope and collapse: Secondary | ICD-10-CM | POA: Diagnosis not present

## 2023-11-05 DIAGNOSIS — D649 Anemia, unspecified: Secondary | ICD-10-CM | POA: Diagnosis not present

## 2023-11-05 DIAGNOSIS — Z7982 Long term (current) use of aspirin: Secondary | ICD-10-CM

## 2023-11-05 DIAGNOSIS — R110 Nausea: Secondary | ICD-10-CM | POA: Diagnosis not present

## 2023-11-05 DIAGNOSIS — N400 Enlarged prostate without lower urinary tract symptoms: Secondary | ICD-10-CM | POA: Diagnosis present

## 2023-11-05 DIAGNOSIS — R197 Diarrhea, unspecified: Secondary | ICD-10-CM | POA: Diagnosis not present

## 2023-11-05 DIAGNOSIS — E119 Type 2 diabetes mellitus without complications: Secondary | ICD-10-CM | POA: Diagnosis present

## 2023-11-05 DIAGNOSIS — Z7984 Long term (current) use of oral hypoglycemic drugs: Secondary | ICD-10-CM

## 2023-11-05 DIAGNOSIS — Z7952 Long term (current) use of systemic steroids: Secondary | ICD-10-CM

## 2023-11-05 DIAGNOSIS — K5730 Diverticulosis of large intestine without perforation or abscess without bleeding: Secondary | ICD-10-CM | POA: Diagnosis present

## 2023-11-05 DIAGNOSIS — E2740 Unspecified adrenocortical insufficiency: Principal | ICD-10-CM | POA: Diagnosis present

## 2023-11-05 DIAGNOSIS — Z888 Allergy status to other drugs, medicaments and biological substances status: Secondary | ICD-10-CM

## 2023-11-05 DIAGNOSIS — E871 Hypo-osmolality and hyponatremia: Secondary | ICD-10-CM | POA: Diagnosis present

## 2023-11-05 DIAGNOSIS — Z79899 Other long term (current) drug therapy: Secondary | ICD-10-CM

## 2023-11-05 DIAGNOSIS — Z79890 Hormone replacement therapy (postmenopausal): Secondary | ICD-10-CM | POA: Diagnosis not present

## 2023-11-05 DIAGNOSIS — R112 Nausea with vomiting, unspecified: Secondary | ICD-10-CM | POA: Diagnosis present

## 2023-11-05 DIAGNOSIS — E039 Hypothyroidism, unspecified: Secondary | ICD-10-CM | POA: Diagnosis not present

## 2023-11-05 DIAGNOSIS — Z8249 Family history of ischemic heart disease and other diseases of the circulatory system: Secondary | ICD-10-CM | POA: Diagnosis not present

## 2023-11-05 DIAGNOSIS — K219 Gastro-esophageal reflux disease without esophagitis: Secondary | ICD-10-CM | POA: Diagnosis present

## 2023-11-05 DIAGNOSIS — I2510 Atherosclerotic heart disease of native coronary artery without angina pectoris: Secondary | ICD-10-CM | POA: Diagnosis present

## 2023-11-05 DIAGNOSIS — E785 Hyperlipidemia, unspecified: Secondary | ICD-10-CM | POA: Diagnosis not present

## 2023-11-05 DIAGNOSIS — I10 Essential (primary) hypertension: Secondary | ICD-10-CM | POA: Diagnosis present

## 2023-11-05 DIAGNOSIS — Z833 Family history of diabetes mellitus: Secondary | ICD-10-CM | POA: Diagnosis not present

## 2023-11-05 DIAGNOSIS — R1010 Upper abdominal pain, unspecified: Secondary | ICD-10-CM | POA: Insufficient documentation

## 2023-11-05 DIAGNOSIS — K922 Gastrointestinal hemorrhage, unspecified: Secondary | ICD-10-CM | POA: Diagnosis not present

## 2023-11-05 DIAGNOSIS — E860 Dehydration: Secondary | ICD-10-CM | POA: Diagnosis present

## 2023-11-05 DIAGNOSIS — R1111 Vomiting without nausea: Secondary | ICD-10-CM | POA: Diagnosis not present

## 2023-11-05 DIAGNOSIS — I951 Orthostatic hypotension: Secondary | ICD-10-CM | POA: Diagnosis not present

## 2023-11-05 DIAGNOSIS — N179 Acute kidney failure, unspecified: Secondary | ICD-10-CM | POA: Diagnosis present

## 2023-11-05 LAB — HEPATIC FUNCTION PANEL
ALT: 11 U/L (ref 0–53)
AST: 18 U/L (ref 0–37)
Albumin: 4 g/dL (ref 3.5–5.2)
Alkaline Phosphatase: 47 U/L (ref 39–117)
Bilirubin, Direct: 0.1 mg/dL (ref 0.0–0.3)
Total Bilirubin: 0.6 mg/dL (ref 0.2–1.2)
Total Protein: 6.4 g/dL (ref 6.0–8.3)

## 2023-11-05 LAB — URINALYSIS, ROUTINE W REFLEX MICROSCOPIC
Bacteria, UA: NONE SEEN
Bilirubin Urine: NEGATIVE
Cellular Cast, UA: 1
Glucose, UA: NEGATIVE mg/dL
Hgb urine dipstick: NEGATIVE
Hgb urine dipstick: NEGATIVE
Ketones, ur: 15 mg/dL — AB
Leukocytes,Ua: NEGATIVE
Leukocytes,Ua: NEGATIVE
Nitrite: NEGATIVE
Nitrite: NEGATIVE
RBC / HPF: NONE SEEN (ref 0–?)
Specific Gravity, Urine: 1.02 (ref 1.000–1.030)
Specific Gravity, Urine: 1.03 (ref 1.005–1.030)
Total Protein, Urine: NEGATIVE
Urine Glucose: NEGATIVE
Urobilinogen, UA: 0.2 (ref 0.0–1.0)
pH: 6 (ref 5.0–8.0)
pH: 5.5 (ref 5.0–8.0)

## 2023-11-05 LAB — CBC WITH DIFFERENTIAL/PLATELET
Basophils Absolute: 0 10*3/uL (ref 0.0–0.1)
Basophils Relative: 0.7 % (ref 0.0–3.0)
Eosinophils Absolute: 0.2 10*3/uL (ref 0.0–0.7)
Eosinophils Relative: 3.6 % (ref 0.0–5.0)
HCT: 36.5 % — ABNORMAL LOW (ref 39.0–52.0)
Hemoglobin: 12.4 g/dL — ABNORMAL LOW (ref 13.0–17.0)
Lymphocytes Relative: 26.3 % (ref 12.0–46.0)
Lymphs Abs: 1.6 10*3/uL (ref 0.7–4.0)
MCHC: 34 g/dL (ref 30.0–36.0)
MCV: 93.9 fL (ref 78.0–100.0)
Monocytes Absolute: 0.7 10*3/uL (ref 0.1–1.0)
Monocytes Relative: 11.3 % (ref 3.0–12.0)
Neutro Abs: 3.6 10*3/uL (ref 1.4–7.7)
Neutrophils Relative %: 58.1 % (ref 43.0–77.0)
Platelets: 225 10*3/uL (ref 150.0–400.0)
RBC: 3.89 Mil/uL — ABNORMAL LOW (ref 4.22–5.81)
RDW: 13.2 % (ref 11.5–15.5)
WBC: 6.3 10*3/uL (ref 4.0–10.5)

## 2023-11-05 LAB — BASIC METABOLIC PANEL
Anion gap: 12 (ref 5–15)
BUN: 18 mg/dL (ref 6–23)
BUN: 21 mg/dL (ref 8–23)
CO2: 24 meq/L (ref 19–32)
CO2: 20 mmol/L — ABNORMAL LOW (ref 22–32)
Calcium: 9.1 mg/dL (ref 8.4–10.5)
Calcium: 8.9 mg/dL (ref 8.9–10.3)
Chloride: 98 meq/L (ref 96–112)
Chloride: 98 mmol/L (ref 98–111)
Creatinine, Ser: 1.51 mg/dL — ABNORMAL HIGH (ref 0.40–1.50)
Creatinine, Ser: 1.31 mg/dL — ABNORMAL HIGH (ref 0.61–1.24)
GFR, Estimated: 53 mL/min — ABNORMAL LOW (ref >60)
GFR: 41.49 mL/min — ABNORMAL LOW (ref >60.00)
Glucose, Bld: 109 mg/dL — ABNORMAL HIGH (ref 70–99)
Glucose, Bld: 132 mg/dL — ABNORMAL HIGH (ref 70–99)
Potassium: 4 meq/L (ref 3.5–5.1)
Potassium: 4.1 mmol/L (ref 3.5–5.1)
Sodium: 130 meq/L — ABNORMAL LOW (ref 135–145)
Sodium: 130 mmol/L — ABNORMAL LOW (ref 135–145)

## 2023-11-05 LAB — CBC
HCT: 34.7 % — ABNORMAL LOW (ref 39.0–52.0)
Hemoglobin: 12 g/dL — ABNORMAL LOW (ref 13.0–17.0)
MCH: 31.4 pg (ref 26.0–34.0)
MCHC: 34.6 g/dL (ref 30.0–36.0)
MCV: 90.8 fL (ref 80.0–100.0)
Platelets: 197 10*3/uL (ref 150–400)
RBC: 3.82 MIL/uL — ABNORMAL LOW (ref 4.22–5.81)
RDW: 13.2 % (ref 11.5–15.5)
WBC: 7.9 10*3/uL (ref 4.0–10.5)
nRBC: 0 % (ref 0.0–0.2)

## 2023-11-05 LAB — CBG MONITORING, ED: Glucose-Capillary: 110 mg/dL — ABNORMAL HIGH (ref 70–99)

## 2023-11-05 LAB — LIPASE: Lipase: 16 U/L (ref 11.0–59.0)

## 2023-11-05 LAB — OCCULT BLOOD X 1 CARD TO LAB, STOOL: Fecal Occult Bld: NEGATIVE

## 2023-11-05 LAB — TROPONIN I (HIGH SENSITIVITY)
Troponin I (High Sensitivity): 2 ng/L (ref <18)
Troponin I (High Sensitivity): 2 ng/L (ref <18)

## 2023-11-05 LAB — MAGNESIUM: Magnesium: 1.7 mg/dL (ref 1.5–2.5)

## 2023-11-05 MED ORDER — LIDOCAINE 5 % EX PTCH
1.0000 | MEDICATED_PATCH | Freq: Every day | CUTANEOUS | Status: DC
Start: 2023-11-05 — End: 2023-11-08
  Administered 2023-11-05 – 2023-11-07 (×3): 1 via TRANSDERMAL
  Filled 2023-11-05 (×4): qty 1

## 2023-11-05 MED ORDER — PANTOPRAZOLE SODIUM 40 MG IV SOLR
40.0000 mg | INTRAVENOUS | Status: AC
Start: 2023-11-05 — End: 2023-11-05
  Administered 2023-11-05 (×2): 40 mg via INTRAVENOUS
  Filled 2023-11-05: qty 10

## 2023-11-05 MED ORDER — PANTOPRAZOLE SODIUM 40 MG IV SOLR
40.0000 mg | Freq: Two times a day (BID) | INTRAVENOUS | Status: DC
  Administered 2023-11-06: 40 mg via INTRAVENOUS
  Filled 2023-11-05 (×2): qty 10

## 2023-11-05 MED ORDER — SODIUM CHLORIDE 0.9 % IV BOLUS
1000.0000 mL | Freq: Once | INTRAVENOUS | Status: AC
  Administered 2023-11-05: 1000 mL via INTRAVENOUS

## 2023-11-05 NOTE — ED Notes (Signed)
Pt returned to room via wheelchair.

## 2023-11-05 NOTE — ED Provider Notes (Signed)
 [place] EMERGENCY DEPARTMENT AT [facility] Provider Note   CSN: [id number] Arrival date & time: 11/05/23  1602    History  Chief Complaint  Patient presents with   Loss of Consciousness    Joseph Rocha is a 88 y.o. male here for evaluation of nausea, vomiting, diarrhea, dark stool.  He states he has had ongoing diarrhea.  Stool is dark, black in color.  Multiple episodes of NBNB emesis.  Not taking any Pepto-Bismol for his stools.  He states he was previously seen for syncope many years ago by cardiology, had a loop recorder.  His symptoms had improved.  Had 2 syncope episodes today.  States he was just sitting down and then slumped over.  No bowel or bladder incontinence, saddle paresthesia.  He denies any preceding symptoms.  He states he has had overall poor intake.  He is post follow-up with GI for his symptoms however there is no appointment till March.  He is not anticoagulated.  HPI     Home Medications Prior to Admission medications   Medication Sig Start Date End Date Taking? Authorizing Provider  albuterol  (VENTOLIN  HFA) 108 (90 Base) MCG/ACT inhaler Inhale 2 puffs into the lungs every 6 (six) hours as needed for wheezing or shortness of breath. 10/03/22   Norleen Lynwood ORN, MD  aspirin  EC 81 MG tablet Take 1 tablet (81 mg total) by mouth daily. Swallow whole. 07/13/20   Norleen Lynwood ORN, MD  calcium  carbonate (OS-CAL) 600 MG TABS Take 600 mg by mouth daily.    [provider]  cetirizine  (ZYRTEC ) 10 MG tablet Take 1 tablet (10 mg total) by mouth daily. 10/15/23 10/14/24  Norleen Lynwood ORN, MD  cyclobenzaprine  (FLEXERIL ) 5 MG tablet Take 1 tablet (5 mg total) by mouth 3 (three) times daily as needed. 09/02/23   Norleen Lynwood ORN, MD  ferrous sulfate  325 (65 FE) MG EC tablet Take 325 mg by mouth daily. 07/26/22   [provider]  finasteride  (PROSCAR ) 5 MG tablet TAKE 1 TABLET DAILY Patient taking differently: Take 5 mg by mouth daily. 04/04/21   Norleen Lynwood ORN,  MD  fludrocortisone  (FLORINEF ) 0.1 MG tablet Take 1 tablet (100 mcg total) by mouth daily. 10/09/23   Odell Celinda Balo, MD  fluticasone  (FLONASE ) 50 MCG/ACT nasal spray Place 2 sprays into both nostrils daily. 10/15/18   Rollene Almarie LABOR, MD  KLOR-CON  M20 20 MEQ tablet Take 20 mEq by mouth daily.  03/15/13   [provider]  levothyroxine  (SYNTHROID ) 50 MCG tablet TAKE 1 TABLET DAILY 04/17/23   Norleen Lynwood ORN, MD  loperamide  (IMODIUM  A-D) 2 MG tablet Take 1 tablet (2 mg total) by mouth 2 (two) times daily as needed for diarrhea or loose stools. 03/25/23   Jerrol Lynwood, MD  meloxicam  (MOBIC ) 7.5 MG tablet Take 1 tablet (7.5 mg total) by mouth daily as needed for pain. 10/15/23   Norleen Lynwood ORN, MD  metFORMIN  (GLUCOPHAGE ) 500 MG tablet TAKE 1 TABLET TWICE DAILY  WITH MEALS 09/25/23   Trixie File, MD  Omega-3 Fatty Acids (FISH OIL ) 1000 MG CAPS Take 1 capsule by mouth daily.    [provider]  omeprazole  (PRILOSEC) 40 MG capsule TAKE 1 CAPSULE DAILY 04/17/23   Norleen Lynwood ORN, MD  ondansetron  (ZOFRAN -ODT) 4 MG disintegrating tablet Take 1 tablet (4 mg total) by mouth every 8 (eight) hours as needed for nausea or vomiting. 10/28/23   Beather Delon Gibson, PA  repaglinide  (PRANDIN ) 2 MG tablet  TAKE 1 TABLET TWICE DAILY  BEFORE MEALS Patient taking differently: Take 2 mg by mouth 2 (two) times daily before a meal. 08/26/23   Norleen Lynwood ORN, MD  rosuvastatin  (CRESTOR ) 5 MG tablet TAKE 1 TABLET DAILY Patient taking differently: Take 5 mg by mouth daily. 09/23/23   Norleen Lynwood ORN, MD  solifenacin  (VESICARE ) 5 MG tablet Take 1 tablet (5 mg total) by mouth daily. 09/02/23   Norleen Lynwood ORN, MD  Tamsulosin  HCl (FLOMAX ) 0.4 MG CAPS Take 0.4 mg by mouth daily.    [provider]  Testosterone  30 MG/ACT SOLN Place 2 Pump onto the skin daily.    [provider]  traMADol  (ULTRAM ) 50 MG tablet Take 1 tablet (50 mg total) by mouth every 6 (six) hours as needed. 10/15/23    Norleen Lynwood ORN, MD      Allergies    Lipitor [atorvastatin ]    Review of Systems   Review of Systems  Constitutional: Negative.   HENT: Negative.    Respiratory: Negative.    Cardiovascular: Negative.   Gastrointestinal:  Positive for diarrhea, nausea and vomiting. Negative for abdominal distention, abdominal pain, anal bleeding, blood in stool, constipation and rectal pain.  Genitourinary: Negative.   Musculoskeletal: Negative.   Skin: Negative.   Neurological:  Positive for syncope and weakness (generalized).  All other systems reviewed and are negative.   Physical Exam Updated Vital Signs BP (!) 141/107   Pulse 92   Temp (!) 97.5 F (36.4 C) (Oral)   Resp (!) 25   Ht 6' 3 (1.905 m)   Wt 86.6 kg   SpO2 100%   BMI 23.87 kg/m  Physical Exam Vitals and nursing note reviewed. Exam conducted with a chaperone present.  Constitutional:      General: He is not in acute distress.    Appearance: He is well-developed. He is not ill-appearing, toxic-appearing or diaphoretic.  HENT:     Head: Atraumatic.  Eyes:     Pupils: Pupils are equal, round, and reactive to light.  Cardiovascular:     Rate and Rhythm: Normal rate and regular rhythm.     Pulses: Normal pulses.     Heart sounds: Normal heart sounds.  Pulmonary:     Effort: Pulmonary effort is normal. No respiratory distress.     Breath sounds: Normal breath sounds.  Abdominal:     General: Bowel sounds are normal. There is no distension.     Palpations: Abdomen is soft.     Tenderness: There is no abdominal tenderness. There is no right CVA tenderness, left CVA tenderness, guarding or rebound.  Genitourinary:    Comments: Scant stool in rectal vault, black on glove. Old hemorrhoid skin tag Musculoskeletal:        General: No swelling, tenderness, deformity or signs of injury. Normal range of motion.     Cervical back: Normal range of motion and neck supple.     Right lower leg: No edema.     Left lower leg: No  edema.  Skin:    General: Skin is warm and dry.     Capillary Refill: Capillary refill takes less than 2 seconds.  Neurological:     General: No focal deficit present.     Mental Status: He is alert and oriented to person, place, and time.     ED Results / Procedures / Treatments   Labs (all labs ordered are listed, but only abnormal results are displayed) Labs Reviewed  BASIC METABOLIC PANEL -  Abnormal; Notable for the following components:      Result Value   Sodium 130 (*)    CO2 20 (*)    Glucose, Bld 132 (*)    Creatinine, Ser 1.31 (*)    GFR, Estimated 53 (*)    All other components within normal limits  CBC - Abnormal; Notable for the following components:   RBC 3.82 (*)    Hemoglobin 12.0 (*)    HCT 34.7 (*)    All other components within normal limits  URINALYSIS, ROUTINE W REFLEX MICROSCOPIC - Abnormal; Notable for the following components:   Ketones, ur 15 (*)    Protein, ur TRACE (*)    All other components within normal limits  CBG MONITORING, ED - Abnormal; Notable for the following components:   Glucose-Capillary 110 (*)    All other components within normal limits  OCCULT BLOOD X 1 CARD TO LAB, STOOL  TROPONIN I (HIGH SENSITIVITY)  TROPONIN I (HIGH SENSITIVITY)    EKG None  Radiology DG Chest Port 1 View Result Date: 11/05/2023 CLINICAL DATA:  106001 Syncope 106001 EXAM: PORTABLE CHEST 1 VIEW COMPARISON:  03/25/2023 chest radiograph. FINDINGS: Stable cardiomediastinal silhouette with normal heart size. No pneumothorax. No pleural effusion. Lungs appear clear, with no acute consolidative airspace disease and no pulmonary edema. IMPRESSION: No active disease. Electronically Signed   By: Selinda DELENA Blue M.D.   On: 11/05/2023 21:19    Procedures Procedures    Medications Ordered in ED Medications  pantoprazole  (PROTONIX ) injection 40 mg (40 mg Intravenous Given 11/05/23 2215)    Followed by  pantoprazole  (PROTONIX ) injection 40 mg (has no  administration in time range)  sodium chloride  0.9 % bolus 1,000 mL (0 mLs Intravenous Stopped 11/05/23 2018)    ED Course/ Medical Decision Making/ A&P   88 year old multiple medical problems here for evaluation of possible dehydration.  He has had ongoing nausea vomiting and diarrhea over the last month.  Was actually admitted for electrolyte abnormalities likely due to this.  He was seen by his PCP today after obtaining CT scan which did not show any significant abnormality.  He states he subsequently had 2 syncopal episodes, 1 here in the emergency department and 1 in the car without any preceding symptoms.  He was previously worked up by cardiology in 2021 for syncope however no clear cause.  He also admits he has been having black stool, no abdominal pain.  He denies any headache, numbness, unilateral weakness.  No chest pain, shortness of breath or abdominal pain.  He is not currently followed by GI for his ongoing GI symptoms.  Labs and imaging personally viewed and interpreted:  Trop 2 x2 neg Occult negative UA negative for infection Metabolic panel sodium 130, creatinine 1.3 similar to prior however uptrending from baseline CBC without leukocytosis, hemoglobin 12.0--similar to baseline Chest x-ray without significant abnormality EKG without ischemic changes Negative orthostatic vital signs  Patient started on Protonix  given his dark stool and diarrhea.  His occult was reassuring and is negative.  He denies any Pepto-Bismol use the medications to help with his diarrhea that could be causing his dark stool.  Biggest concern is his recurrent syncope without any prodromal symptoms.  Feel he likely would benefit for observation overnight.  He is agreeable for this.  Discussed with Dr. Laveda with medicine who agrees to evaluate patient for admission.  The patient appears reasonably stabilized for admission considering the current resources, flow, and capabilities available in the ED at  this time, and I doubt any other [facility] requiring further screening and/or treatment in the ED prior to admission.                                 Medical Decision Making Amount and/or Complexity of Data Reviewed Independent Historian:     Details: Family in room External Data Reviewed: labs, radiology, ECG and notes. Labs: ordered. Decision-making details documented in ED Course. Radiology: ordered and independent interpretation performed. Decision-making details documented in ED Course. ECG/medicine tests: ordered and independent interpretation performed. Decision-making details documented in ED Course.  Risk OTC drugs. Prescription drug management. Parenteral controlled substances. Decision regarding hospitalization. Diagnosis or treatment significantly limited by social determinants of health.          Final Clinical Impression(s) / ED Diagnoses Final diagnoses:  Syncope, unspecified syncope type  Nausea vomiting and diarrhea  Upper GI bleed    Rx / DC Orders ED Discharge Orders     None         Laryssa Hassing A, PA-C 11/05/23 2332    Geraldene Hamilton, MD 11/08/23 2326

## 2023-11-05 NOTE — ED Triage Notes (Signed)
 Pt via ems from home via ems. Pt states he went to pcp today for emesis and nausea and that he was on his way home when he passed out. While he was awaiting triage in lobby, registration called out and said that he fell over. He was found slumped over in the chair.   Per ems, he was brought from home; he went to pcp and had CT scan; they sent off labs. Pt reports that he was told by his pcp that he needs fluids. EMS said he wanted to be transported to ED to get fluids. He has seen pcp several times for fatigue and nausea in last month. He has GI appt in March. CBG 134 at home. Pt ambulated fine at home down stairs. VS WNL  Pt alert & oriented, nad noted.

## 2023-11-05 NOTE — Patient Instructions (Signed)
 Ok to STOP the magnesium   Please continue all other medications as before, including the nausea pills and probiotics  Please have the pharmacy call with any other refills you may need.  Please keep your appointments with your specialists as you may have planned  Please go to the LAB at the blood drawing area for the tests to be done  You will be contacted by phone if any changes need to be made immediately.  Otherwise, you will receive a letter about your results with an explanation, but please check with MyChart first.

## 2023-11-05 NOTE — Progress Notes (Signed)
 Patient ID: Joseph Rocha, male   DOB: 08/11/1935, 88 y.o.   MRN: [id number]        Chief Complaint: follow up watery diarrhea with dark stools       HPI:  Joseph Rocha is a 88 y.o. male here overall feeling poorly again with persistent watery but dark stools after starting OTC magnesium  supplements since the rx sent for this was deemed OTC eligible at pharmacy.  Struggling to keep up fluids with pedialyte with occasional nausea vomiting and mostly upper abd discomfort.  Denies worsening reflux, dysphagia, other bowel change or blood.  Dec 27 CT abd pelvis without acute process noted.  Ginger tea and soup seem to be the best tolerated po.  Has lost several lbs.  Feels overall weak with less stamina, but today with wife Pt denies chest pain, increased sob or doe, wheezing, orthopnea, PND, increased LE swelling, palpitations, dizziness or syncope.         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 He has never used smokeless  tobacco. He reports that he does not drink alcohol and does not use drugs. family history includes Cancer in his brother; Diabetes Mellitus II in his mother; Healthy in his daughter; Heart attack in his father. Allergies  Allergen Reactions   Lipitor [Atorvastatin ] Other (See Comments)    Myalgias, cramps in hand   No current facility-administered medications on file prior to visit.   Current Outpatient Medications on File Prior to Visit  Medication Sig Dispense Refill   albuterol  (VENTOLIN  HFA) 108 (90 Base) MCG/ACT inhaler Inhale 2 puffs into the lungs every 6 (six) hours as needed for wheezing or shortness of breath. 8 g 0   aspirin  EC 81 MG tablet Take 1 tablet (81 mg total) by mouth daily. Swallow whole. 30 tablet 11   calcium  carbonate (OS-CAL) 600 MG TABS Take 600 mg by mouth daily.     cetirizine  (ZYRTEC ) 10 MG tablet Take 1 tablet (10 mg total) by mouth daily. 30 tablet 11   cyclobenzaprine  (FLEXERIL ) 5 MG tablet Take 1 tablet (5 mg total) by mouth 3 (three) times daily as needed. 40 tablet 2   ferrous sulfate  325 (65 FE) MG EC tablet Take 325 mg by mouth daily.     finasteride  (PROSCAR ) 5 MG tablet TAKE 1 TABLET DAILY (Patient taking differently:  Take 5 mg by mouth daily.) 90 tablet 1   fludrocortisone  (FLORINEF ) 0.1 MG tablet Take 1 tablet (100 mcg total) by mouth daily. 90 tablet 1   fluticasone  (FLONASE ) 50 MCG/ACT nasal spray Place 2 sprays into both nostrils daily. 16 g 6   KLOR-CON  M20 20 MEQ tablet Take 20 mEq by mouth daily.      levothyroxine  (SYNTHROID ) 50 MCG tablet TAKE 1 TABLET DAILY 90 tablet 2   loperamide  (IMODIUM  A-D) 2 MG tablet Take 1 tablet (2 mg total) by mouth 2 (two) times daily as needed for diarrhea or loose stools. 30 tablet 3   meloxicam  (MOBIC ) 7.5 MG tablet Take 1 tablet (7.5 mg total) by mouth daily as needed for pain. 90 tablet 1   metFORMIN  (GLUCOPHAGE ) 500 MG tablet TAKE 1 TABLET TWICE DAILY  WITH MEALS 60 tablet 0   Omega-3 Fatty Acids (FISH OIL )  1000 MG CAPS Take 1 capsule by mouth daily.     omeprazole  (PRILOSEC) 40 MG capsule TAKE 1 CAPSULE DAILY 90 capsule 2   ondansetron  (ZOFRAN -ODT) 4 MG disintegrating tablet Take 1 tablet (4 mg total) by mouth every 8 (eight) hours as needed for nausea or vomiting. 30 tablet 1   repaglinide  (PRANDIN ) 2 MG tablet TAKE 1 TABLET TWICE DAILY  BEFORE MEALS (Patient taking differently: Take 2 mg by mouth 2 (two) times daily before a meal.) 180 tablet 3   rosuvastatin  (CRESTOR ) 5 MG tablet TAKE 1 TABLET DAILY (Patient taking differently: Take 5 mg by mouth daily.) 90 tablet 3   solifenacin  (VESICARE ) 5 MG tablet Take 1 tablet (5 mg total) by mouth daily. 90 tablet 3   Tamsulosin  HCl (FLOMAX ) 0.4 MG CAPS Take 0.4 mg by mouth daily.     Testosterone  30 MG/ACT SOLN Place 2 Pump onto the skin daily.     traMADol  (ULTRAM ) 50 MG tablet Take 1 tablet (50 mg total) by mouth every 6 (six) hours as needed. 30 tablet 2        ROS:  All others reviewed and negative.  Objective        PE:  BP 126/74 (BP Location: Right Arm, Patient Position: Sitting, Cuff Size: Normal)   Pulse 64   Temp 97.8 F (36.6 C) (Oral)   Ht 6' 3 (1.905 m)   Wt 191 lb (86.6 kg)   SpO2 98%   BMI 23.87 kg/m                 Constitutional: Pt appears in NAD               HENT: Head: NCAT.                Right Ear: External ear normal.                 Left Ear: External ear normal.                Eyes: . Pupils are equal, round, and reactive to light. Conjunctivae and EOM are normal               Nose: without d/c or deformity               Neck: Neck supple. Gross normal ROM               Cardiovascular: Normal rate and regular rhythm.                 Pulmonary/Chest: Effort normal and breath  sounds without rales or wheezing.                Abd:  Soft, NT, ND, + BS, no organomegaly - benign               Neurological: Pt is alert. At baseline orientation, motor grossly intact               Skin: Skin is warm. No rashes, no other  new lesions, LE edema - none               Psychiatric: Pt behavior is normal without agitation   Micro: none  Cardiac tracings I have personally interpreted today:  none  Pertinent Radiological findings (summarize): none   Lab Results  Component Value Date   WBC 7.9 11/05/2023   HGB 11.4 (L) 11/06/2023   HCT 31.9 (L) 11/06/2023   PLT 197 11/05/2023   GLUCOSE 132 (H) 11/05/2023   CHOL 196 07/17/2023   TRIG 114.0 07/17/2023   HDL 57.60 07/17/2023   LDLCALC 116 (H) 07/17/2023   ALT 11 11/05/2023   AST 18 11/05/2023   NA 130 (L) 11/05/2023   K 4.1 11/05/2023   CL 98 11/05/2023   CREATININE 1.31 (H) 11/05/2023   BUN 21 11/05/2023   CO2 20 (L) 11/05/2023   TSH 2.86 10/07/2023   PSA 3.04 07/07/2014   HGBA1C 7.9 (H) 10/09/2023   MICROALBUR 0.7 07/17/2023   Assessment/Plan:  Joseph Rocha is a 88 y.o. Black or African American [2] male with  has a past medical history of Allergy, Anemia, Blood transfusion without reported diagnosis, Diabetes mellitus, Elevated PSA, Hyperlipidemia, Hypertension, Hypogonadism male, OA (osteoarthritis), and Pituitary abnormality (HCC) (11/05/2002).  Hypomagnesemia Pt to stop magnesium  supplements given diarrhea,  to f/u any worsening symptoms or concerns, for magnesium  level with labs  Diarrhea To stop magnesium  supp, o/w etiology unclear, hopefully to improve with stopping, but also encourage increased po intake as is likely at least mild low volume, pt declines referral to ED for fluids at this time  Dark stools Also for cbc with labs but has persistent symptoms and stable hgb  Upper abdominal pain Exam benign, recent CT neg, wife states mostly gas pains, cont to follow  Followup: Return if symptoms worsen or fail to improve.  Lynwood Rush, MD 11/06/2023 4:22 PM [facility] Medicine

## 2023-11-05 NOTE — ED Notes (Signed)
Pt taken to restroom via wheelchair, instructed to utilize call bell when finished

## 2023-11-05 NOTE — ED Notes (Addendum)
Pt's CBG result was 110. Informed [place] - RN. Pt unable to use the bathroom at this time.

## 2023-11-06 ENCOUNTER — Encounter: Payer: Self-pay | Admitting: Internal Medicine

## 2023-11-06 DIAGNOSIS — E2740 Unspecified adrenocortical insufficiency: Secondary | ICD-10-CM | POA: Diagnosis present

## 2023-11-06 DIAGNOSIS — R195 Other fecal abnormalities: Secondary | ICD-10-CM | POA: Diagnosis present

## 2023-11-06 DIAGNOSIS — Z7952 Long term (current) use of systemic steroids: Secondary | ICD-10-CM | POA: Diagnosis not present

## 2023-11-06 DIAGNOSIS — N179 Acute kidney failure, unspecified: Secondary | ICD-10-CM | POA: Diagnosis present

## 2023-11-06 DIAGNOSIS — I2510 Atherosclerotic heart disease of native coronary artery without angina pectoris: Secondary | ICD-10-CM | POA: Diagnosis present

## 2023-11-06 DIAGNOSIS — E039 Hypothyroidism, unspecified: Secondary | ICD-10-CM | POA: Diagnosis present

## 2023-11-06 DIAGNOSIS — E119 Type 2 diabetes mellitus without complications: Secondary | ICD-10-CM | POA: Diagnosis present

## 2023-11-06 DIAGNOSIS — E871 Hypo-osmolality and hyponatremia: Secondary | ICD-10-CM | POA: Diagnosis present

## 2023-11-06 DIAGNOSIS — R55 Syncope and collapse: Secondary | ICD-10-CM

## 2023-11-06 DIAGNOSIS — E860 Dehydration: Secondary | ICD-10-CM | POA: Diagnosis present

## 2023-11-06 DIAGNOSIS — I951 Orthostatic hypotension: Secondary | ICD-10-CM | POA: Diagnosis present

## 2023-11-06 DIAGNOSIS — I10 Essential (primary) hypertension: Secondary | ICD-10-CM | POA: Diagnosis present

## 2023-11-06 DIAGNOSIS — Z888 Allergy status to other drugs, medicaments and biological substances status: Secondary | ICD-10-CM | POA: Diagnosis not present

## 2023-11-06 DIAGNOSIS — Z8249 Family history of ischemic heart disease and other diseases of the circulatory system: Secondary | ICD-10-CM | POA: Diagnosis not present

## 2023-11-06 DIAGNOSIS — R112 Nausea with vomiting, unspecified: Secondary | ICD-10-CM | POA: Diagnosis present

## 2023-11-06 DIAGNOSIS — R197 Diarrhea, unspecified: Secondary | ICD-10-CM | POA: Insufficient documentation

## 2023-11-06 DIAGNOSIS — K5730 Diverticulosis of large intestine without perforation or abscess without bleeding: Secondary | ICD-10-CM | POA: Diagnosis present

## 2023-11-06 DIAGNOSIS — D649 Anemia, unspecified: Secondary | ICD-10-CM | POA: Diagnosis present

## 2023-11-06 DIAGNOSIS — Z79890 Hormone replacement therapy (postmenopausal): Secondary | ICD-10-CM | POA: Diagnosis not present

## 2023-11-06 DIAGNOSIS — Z79899 Other long term (current) drug therapy: Secondary | ICD-10-CM | POA: Diagnosis not present

## 2023-11-06 DIAGNOSIS — E785 Hyperlipidemia, unspecified: Secondary | ICD-10-CM | POA: Diagnosis present

## 2023-11-06 DIAGNOSIS — K219 Gastro-esophageal reflux disease without esophagitis: Secondary | ICD-10-CM | POA: Diagnosis present

## 2023-11-06 DIAGNOSIS — N400 Enlarged prostate without lower urinary tract symptoms: Secondary | ICD-10-CM | POA: Diagnosis present

## 2023-11-06 DIAGNOSIS — Z833 Family history of diabetes mellitus: Secondary | ICD-10-CM | POA: Diagnosis not present

## 2023-11-06 DIAGNOSIS — Z7984 Long term (current) use of oral hypoglycemic drugs: Secondary | ICD-10-CM | POA: Diagnosis not present

## 2023-11-06 DIAGNOSIS — Z7982 Long term (current) use of aspirin: Secondary | ICD-10-CM | POA: Diagnosis not present

## 2023-11-06 LAB — HEMOGLOBIN AND HEMATOCRIT, BLOOD
HCT: 33.1 % — ABNORMAL LOW (ref 39.0–52.0)
HCT: 31.9 % — ABNORMAL LOW (ref 39.0–52.0)
HCT: 32.4 % — ABNORMAL LOW (ref 39.0–52.0)
Hemoglobin: 11.5 g/dL — ABNORMAL LOW (ref 13.0–17.0)
Hemoglobin: 11.4 g/dL — ABNORMAL LOW (ref 13.0–17.0)
Hemoglobin: 10.9 g/dL — ABNORMAL LOW (ref 13.0–17.0)

## 2023-11-06 LAB — C DIFFICILE QUICK SCREEN W PCR REFLEX
C Diff antigen: NEGATIVE
C Diff interpretation: NOT DETECTED
C Diff toxin: NEGATIVE

## 2023-11-06 LAB — GLUCOSE, CAPILLARY
Glucose-Capillary: 106 mg/dL — ABNORMAL HIGH (ref 70–99)
Glucose-Capillary: 118 mg/dL — ABNORMAL HIGH (ref 70–99)
Glucose-Capillary: 152 mg/dL — ABNORMAL HIGH (ref 70–99)

## 2023-11-06 LAB — CBG MONITORING, ED
Glucose-Capillary: 95 mg/dL (ref 70–99)
Glucose-Capillary: 73 mg/dL (ref 70–99)
Glucose-Capillary: 106 mg/dL — ABNORMAL HIGH (ref 70–99)

## 2023-11-06 MED ORDER — ACETAMINOPHEN 650 MG RE SUPP
650.0000 mg | Freq: Four times a day (QID) | RECTAL | Status: DC | PRN
Start: 2023-11-06 — End: 2023-11-08

## 2023-11-06 MED ORDER — FERROUS SULFATE 325 (65 FE) MG PO TABS
325.0000 mg | ORAL_TABLET | Freq: Every day | ORAL | Status: DC
  Administered 2023-11-07 – 2023-11-08 (×2): 325 mg via ORAL
  Filled 2023-11-06 (×2): qty 1

## 2023-11-06 MED ORDER — FLUDROCORTISONE ACETATE 0.1 MG PO TABS
0.1000 mg | ORAL_TABLET | Freq: Every day | ORAL | Status: DC
  Administered 2023-11-07 – 2023-11-08 (×2): 0.1 mg via ORAL
  Filled 2023-11-06 (×2): qty 1

## 2023-11-06 MED ORDER — TAMSULOSIN HCL 0.4 MG PO CAPS
0.4000 mg | ORAL_CAPSULE | Freq: Every day | ORAL | Status: DC
  Administered 2023-11-06 – 2023-11-08 (×3): 0.4 mg via ORAL
  Filled 2023-11-06 (×3): qty 1

## 2023-11-06 MED ORDER — PANTOPRAZOLE SODIUM 40 MG PO TBEC
40.0000 mg | DELAYED_RELEASE_TABLET | Freq: Every day | ORAL | Status: DC
  Administered 2023-11-06 – 2023-11-08 (×3): 40 mg via ORAL
  Filled 2023-11-06 (×3): qty 1

## 2023-11-06 MED ORDER — ASPIRIN 81 MG PO TBEC
81.0000 mg | DELAYED_RELEASE_TABLET | Freq: Every day | ORAL | Status: DC
  Administered 2023-11-07 – 2023-11-08 (×2): 81 mg via ORAL
  Filled 2023-11-06 (×2): qty 1

## 2023-11-06 MED ORDER — INSULIN ASPART 100 UNIT/ML IJ SOLN
0.0000 [IU] | INTRAMUSCULAR | Status: DC
  Administered 2023-11-06: 3 [IU] via SUBCUTANEOUS

## 2023-11-06 MED ORDER — OXYCODONE HCL 5 MG PO TABS: 5.0000 mg | ORAL_TABLET | ORAL | Status: DC | PRN

## 2023-11-06 MED ORDER — FESOTERODINE FUMARATE ER 4 MG PO TB24
4.0000 mg | ORAL_TABLET | Freq: Every day | ORAL | Status: DC
  Administered 2023-11-06 – 2023-11-08 (×3): 4 mg via ORAL
  Filled 2023-11-06 (×3): qty 1

## 2023-11-06 MED ORDER — FINASTERIDE 5 MG PO TABS
5.0000 mg | ORAL_TABLET | Freq: Every day | ORAL | Status: DC
  Administered 2023-11-06 – 2023-11-08 (×3): 5 mg via ORAL
  Filled 2023-11-06 (×3): qty 1

## 2023-11-06 MED ORDER — ACETAMINOPHEN 325 MG PO TABS: 650.0000 mg | ORAL_TABLET | Freq: Four times a day (QID) | ORAL | Status: DC | PRN

## 2023-11-06 MED ORDER — INSULIN ASPART 100 UNIT/ML IJ SOLN
0.0000 [IU] | Freq: Three times a day (TID) | INTRAMUSCULAR | Status: DC
  Administered 2023-11-07: 3 [IU] via SUBCUTANEOUS

## 2023-11-06 MED ORDER — ROSUVASTATIN CALCIUM 5 MG PO TABS
5.0000 mg | ORAL_TABLET | Freq: Every day | ORAL | Status: DC
  Administered 2023-11-06 – 2023-11-08 (×3): 5 mg via ORAL
  Filled 2023-11-06 (×3): qty 1

## 2023-11-06 MED ORDER — SODIUM CHLORIDE 0.9 % IV SOLN
INTRAVENOUS | Status: AC
Start: 2023-11-06 — End: 2023-11-07

## 2023-11-06 MED ORDER — ENOXAPARIN SODIUM 40 MG/0.4ML IJ SOSY
40.0000 mg | PREFILLED_SYRINGE | INTRAMUSCULAR | Status: DC
  Administered 2023-11-06 – 2023-11-07 (×2): 40 mg via SUBCUTANEOUS
  Filled 2023-11-06 (×2): qty 0.4

## 2023-11-06 MED ORDER — LEVOTHYROXINE SODIUM 50 MCG PO TABS
50.0000 ug | ORAL_TABLET | Freq: Every day | ORAL | Status: DC
  Administered 2023-11-06 – 2023-11-08 (×3): 50 ug via ORAL
  Filled 2023-11-06 (×2): qty 1
  Filled 2023-11-06: qty 2

## 2023-11-06 MED ORDER — ONDANSETRON HCL 4 MG/2ML IJ SOLN: 4.0000 mg | Freq: Four times a day (QID) | INTRAMUSCULAR | Status: DC | PRN

## 2023-11-06 NOTE — ED Notes (Signed)
 Called [facility] for transport, pt bed assignment is ready

## 2023-11-06 NOTE — Assessment & Plan Note (Signed)
 Exam benign, recent CT neg, wife states mostly "gas pains", cont to follow

## 2023-11-06 NOTE — H&P (Signed)
 History and Physical    Joseph Rocha FMW:[id number] DOB: 1935-03-21 DOA: 11/05/2023  PCP: Norleen Lynwood ORN, MD   Chief Complaint: syncope  HPI: Joseph Rocha is a 89 y.o. male with medical history significant of type 2 diabetes, hypertension, hyperlipidemia who presented to the emergency department due to syncope.  Patient endorsing nausea vomiting diarrhea as well as dark black stools.  In the ER he had a repeat syncopal episode.  He had issues with syncope in the past months seen by cardiology and had a loop recorder.  He states that the episodes come all of a sudden without warning.  Over the last several days has had poor p.o. intake due to his GI symptoms.  He presented to the ER for further assessment.  On arrival he was afebrile and hemodynamically stable.  Labs were obtained which demonstrated sodium 130, creatinine 1.3, bicarb 20, WBC 7.9, hemoglobin 12, urinalysis negative for infection GI pathogen panel and C. difficile are pending at time of admission.  Patient had chest x-ray which showed no acute findings.  CT abdomen pelvis demonstrated diverticulosis.  Patient was admitted for further management if syncope.  On admission he was endorses feeling Nausea and stated stool was dark though not black.    Review of Systems: Review of Systems  Constitutional: Negative.  Negative for fever.  HENT: Negative.    Eyes: Negative.   Respiratory: Negative.    Cardiovascular: Negative.   Gastrointestinal:  Positive for diarrhea, nausea and vomiting.  Genitourinary: Negative.   Musculoskeletal: Negative.   Skin: Negative.   Neurological: Negative.   Endo/Heme/Allergies: Negative.   Psychiatric/Behavioral: Negative.       As per HPI otherwise 10 point review of systems negative.   Allergies  Allergen Reactions   Lipitor [Atorvastatin ] Other (See Comments)    Myalgias, cramps in hand    Past Medical History:  Diagnosis Date   Allergy    Anemia    Blood transfusion without  reported diagnosis    Diabetes mellitus    Elevated PSA    Hyperlipidemia    Hypertension    Hypogonadism male    OA (osteoarthritis)    Pituitary abnormality (HCC) 11/05/2002    Past Surgical History:  Procedure Laterality Date   DOPPLER ECHOCARDIOGRAPHY  12/04/2001   ELECTROCARDIOGRAM  03/25/2006   EP IMPLANTABLE DEVICE N/A 03/16/2016   Procedure: Loop Recorder Insertion;  Surgeon: Elspeth JAYSON Sage, MD;  Location: [facility];  Service: Cardiovascular;  Laterality: N/A;   KNEE ARTHROSCOPY Left    LUMBAR DISC SURGERY     MENISCUS REPAIR Right may 2015   TONSILLECTOMY     TRANSPHENOIDAL / TRANSNASAL HYPOPHYSECTOMY / RESECTION PITUITARY TUMOR  2005     reports that he has never smoked. He has never used smokeless tobacco. He reports that he does not drink alcohol and does not use drugs.  Family History  Problem Relation Age of Onset   Diabetes Mellitus II Mother        Deceased, 75s   Heart attack Father    Cancer Brother        uncertain type   Healthy Daughter    Colon cancer Neg Hx    Esophageal cancer Neg Hx    Liver cancer Neg Hx    Pancreatic cancer Neg Hx    Stomach cancer Neg Hx     Prior to Admission medications   Medication Sig Start Date End Date Taking? Authorizing Provider  albuterol  (VENTOLIN  HFA) 108 (  90 Base) MCG/ACT inhaler Inhale 2 puffs into the lungs every 6 (six) hours as needed for wheezing or shortness of breath. 10/03/22   Norleen Lynwood ORN, MD  aspirin  EC 81 MG tablet Take 1 tablet (81 mg total) by mouth daily. Swallow whole. 07/13/20   Norleen Lynwood ORN, MD  calcium  carbonate (OS-CAL) 600 MG TABS Take 600 mg by mouth daily.    [provider]  cetirizine  (ZYRTEC ) 10 MG tablet Take 1 tablet (10 mg total) by mouth daily. 10/15/23 10/14/24  Norleen Lynwood ORN, MD  cyclobenzaprine  (FLEXERIL ) 5 MG tablet Take 1 tablet (5 mg total) by mouth 3 (three) times daily as needed. 09/02/23   Norleen Lynwood ORN, MD  ferrous sulfate  325 (65 FE) MG EC tablet Take 325 mg  by mouth daily. 07/26/22   [provider]  finasteride  (PROSCAR ) 5 MG tablet TAKE 1 TABLET DAILY Patient taking differently: Take 5 mg by mouth daily. 04/04/21   Norleen Lynwood ORN, MD  fludrocortisone  (FLORINEF ) 0.1 MG tablet Take 1 tablet (100 mcg total) by mouth daily. 10/09/23   Odell Celinda Balo, MD  fluticasone  (FLONASE ) 50 MCG/ACT nasal spray Place 2 sprays into both nostrils daily. 10/15/18   Rollene Almarie LABOR, MD  KLOR-CON  M20 20 MEQ tablet Take 20 mEq by mouth daily.  03/15/13   [provider]  levothyroxine  (SYNTHROID ) 50 MCG tablet TAKE 1 TABLET DAILY 04/17/23   Norleen Lynwood ORN, MD  loperamide  (IMODIUM  A-D) 2 MG tablet Take 1 tablet (2 mg total) by mouth 2 (two) times daily as needed for diarrhea or loose stools. 03/25/23   Jerrol Lynwood, MD  meloxicam  (MOBIC ) 7.5 MG tablet Take 1 tablet (7.5 mg total) by mouth daily as needed for pain. 10/15/23   Norleen Lynwood ORN, MD  metFORMIN  (GLUCOPHAGE ) 500 MG tablet TAKE 1 TABLET TWICE DAILY  WITH MEALS 09/25/23   Trixie File, MD  Omega-3 Fatty Acids (FISH OIL ) 1000 MG CAPS Take 1 capsule by mouth daily.    [provider]  omeprazole  (PRILOSEC) 40 MG capsule TAKE 1 CAPSULE DAILY 04/17/23   Norleen Lynwood ORN, MD  ondansetron  (ZOFRAN -ODT) 4 MG disintegrating tablet Take 1 tablet (4 mg total) by mouth every 8 (eight) hours as needed for nausea or vomiting. 10/28/23   Beather Delon Gibson, PA  repaglinide  (PRANDIN ) 2 MG tablet TAKE 1 TABLET TWICE DAILY  BEFORE MEALS Patient taking differently: Take 2 mg by mouth 2 (two) times daily before a meal. 08/26/23   Norleen Lynwood ORN, MD  rosuvastatin  (CRESTOR ) 5 MG tablet TAKE 1 TABLET DAILY Patient taking differently: Take 5 mg by mouth daily. 09/23/23   Norleen Lynwood ORN, MD  solifenacin  (VESICARE ) 5 MG tablet Take 1 tablet (5 mg total) by mouth daily. 09/02/23   Norleen Lynwood ORN, MD  Tamsulosin  HCl (FLOMAX ) 0.4 MG CAPS Take 0.4 mg by mouth daily.    [provider]  Testosterone  30  MG/ACT SOLN Place 2 Pump onto the skin daily.    [provider]  traMADol  (ULTRAM ) 50 MG tablet Take 1 tablet (50 mg total) by mouth every 6 (six) hours as needed. 10/15/23   Norleen Lynwood ORN, MD    Physical Exam: Vitals:   11/06/23 1100 11/06/23 1138 11/06/23 1200 11/06/23 1300  BP: (!) 107/94  108/80 124/75  Pulse: 80  75 61  Resp: 19  19 14   Temp:  98.5 F (36.9 C)    TempSrc:  Oral    SpO2: 99%  97%  99%  Weight:      Height:       Physical Exam Vitals reviewed.  Constitutional:      Appearance: He is normal weight.  HENT:     Head: Normocephalic.     Nose: Nose normal.     Mouth/Throat:     Mouth: Mucous membranes are moist.     Pharynx: Oropharynx is clear.  Eyes:     Conjunctiva/sclera: Conjunctivae normal.     Pupils: Pupils are equal, round, and reactive to light.  Cardiovascular:     Rate and Rhythm: Normal rate and regular rhythm.     Pulses: Normal pulses.     Heart sounds: Normal heart sounds.  Pulmonary:     Effort: Pulmonary effort is normal.     Breath sounds: Normal breath sounds.  Abdominal:     General: Abdomen is flat. Bowel sounds are normal.  Musculoskeletal:        General: Normal range of motion.     Cervical back: Normal range of motion.  Skin:    General: Skin is warm.     Capillary Refill: Capillary refill takes less than 2 seconds.  Neurological:     General: No focal deficit present.     Mental Status: He is alert.  Psychiatric:        Mood and Affect: Mood normal.        Labs on Admission: I have personally reviewed the patients's labs and imaging studies.  Assessment/Plan Principal Problem:   SYNCOPE Active Problems:   Syncope    # Syncopal episode 2/2 dehydration in setting of GI pathogen - Patient endorses nausea vomiting diarrhea in setting of recurrent syncope - Patient appears dehydrated on exam no leukocytosis Plan: IV fluids Echocardiogram FU GIP, cdiff  # Chronic anemia - Patient's hemoglobin at  baseline - No overt GI bleeding  Plan: Trend hemoglobin  # CAD-continue aspirin   # BPH-continue Proscar   #Hypothyroidism-continue Synthroid   # GERD-continue Protonix   #Chronic hyponaturemia- continue florinef   Admission status: Inpatient Telemetry  Certification: The appropriate patient status for this patient is INPATIENT. Inpatient status is judged to be reasonable and necessary in order to provide the required intensity of service to ensure the patient's safety. The patient's presenting symptoms, physical exam findings, and initial radiographic and laboratory data in the context of their chronic comorbidities is felt to place them at high risk for further clinical deterioration. Furthermore, it is not anticipated that the patient will be medically stable for discharge from the hospital within 2 midnights of admission.   * I certify that at the point of admission it is my clinical judgment that the patient will require inpatient hospital care spanning beyond 2 midnights from the point of admission due to high intensity of service, high risk for further deterioration and high frequency of surveillance required.DEWAINE Lamar Dess MD [facility] If 7PM-7AM, please contact night-coverage [email or web address]  11/06/2023, 3:47 PM

## 2023-11-06 NOTE — Assessment & Plan Note (Signed)
 To stop magnesium supp, o/w etiology unclear, hopefully to improve with stopping, but also encourage increased po intake as is likely at least mild low volume, pt declines referral to ED for fluids at this time

## 2023-11-06 NOTE — Plan of Care (Signed)

## 2023-11-06 NOTE — Assessment & Plan Note (Signed)
 Pt to stop magnesium supplements given diarrhea,  to f/u any worsening symptoms or concerns, for magnesium level with labs

## 2023-11-06 NOTE — Assessment & Plan Note (Signed)
 Also for cbc with labs but has persistent symptoms and stable hgb

## 2023-11-06 NOTE — ED Notes (Signed)
 Called [facility], spoke to [facility], stated they're waiting for d/c

## 2023-11-07 ENCOUNTER — Inpatient Hospital Stay (HOSPITAL_COMMUNITY): Payer: Medicare Other

## 2023-11-07 DIAGNOSIS — R55 Syncope and collapse: Secondary | ICD-10-CM

## 2023-11-07 LAB — CBC
HCT: 33.9 % — ABNORMAL LOW (ref 39.0–52.0)
Hemoglobin: 11.6 g/dL — ABNORMAL LOW (ref 13.0–17.0)
MCH: 31.8 pg (ref 26.0–34.0)
MCHC: 34.2 g/dL (ref 30.0–36.0)
MCV: 92.9 fL (ref 80.0–100.0)
Platelets: 179 10*3/uL (ref 150–400)
RBC: 3.65 MIL/uL — ABNORMAL LOW (ref 4.22–5.81)
RDW: 13 % (ref 11.5–15.5)
WBC: 7.5 10*3/uL (ref 4.0–10.5)
nRBC: 0 % (ref 0.0–0.2)

## 2023-11-07 LAB — GLUCOSE, CAPILLARY
Glucose-Capillary: 130 mg/dL — ABNORMAL HIGH (ref 70–99)
Glucose-Capillary: 112 mg/dL — ABNORMAL HIGH (ref 70–99)
Glucose-Capillary: 151 mg/dL — ABNORMAL HIGH (ref 70–99)
Glucose-Capillary: 111 mg/dL — ABNORMAL HIGH (ref 70–99)
Glucose-Capillary: 189 mg/dL — ABNORMAL HIGH (ref 70–99)

## 2023-11-07 LAB — GASTROINTESTINAL PANEL BY PCR, STOOL (REPLACES STOOL CULTURE)

## 2023-11-07 LAB — COMPREHENSIVE METABOLIC PANEL
ALT: 13 U/L (ref 0–44)
AST: 17 U/L (ref 15–41)
Albumin: 3.4 g/dL — ABNORMAL LOW (ref 3.5–5.0)
Alkaline Phosphatase: 42 U/L (ref 38–126)
Anion gap: 5 (ref 5–15)
BUN: 13 mg/dL (ref 8–23)
CO2: 21 mmol/L — ABNORMAL LOW (ref 22–32)
Calcium: 8.2 mg/dL — ABNORMAL LOW (ref 8.9–10.3)
Chloride: 99 mmol/L (ref 98–111)
Creatinine, Ser: 1.1 mg/dL (ref 0.61–1.24)
GFR, Estimated: >60 mL/min (ref >60)
Glucose, Bld: 114 mg/dL — ABNORMAL HIGH (ref 70–99)
Potassium: 3.6 mmol/L (ref 3.5–5.1)
Sodium: 125 mmol/L — ABNORMAL LOW (ref 135–145)
Total Bilirubin: 0.5 mg/dL (ref 0.0–1.2)
Total Protein: 6.3 g/dL — ABNORMAL LOW (ref 6.5–8.1)

## 2023-11-07 LAB — ECHOCARDIOGRAM COMPLETE
Area-P 1/2: 5.97 cm2
Calc EF: 56 %
Est EF: 50
Height: 75 in
S' Lateral: 2.6 cm
Single Plane A2C EF: 56.8 %
Single Plane A4C EF: 55.6 %
Weight: 3181.68 [oz_av]

## 2023-11-07 LAB — PROTIME-INR
INR: 1.2 (ref 0.8–1.2)
Prothrombin Time: 15.2 s (ref 11.4–15.2)

## 2023-11-07 LAB — OSMOLALITY, URINE: Osmolality, Ur: 405 mosm/kg (ref 300–900)

## 2023-11-07 LAB — CORTISOL: Cortisol, Plasma: 5 ug/dL

## 2023-11-07 LAB — OSMOLALITY: Osmolality: 287 mosm/kg (ref 275–295)

## 2023-11-07 LAB — SODIUM, URINE, RANDOM: Sodium, Ur: 102 mmol/L

## 2023-11-07 MED ORDER — HYDROCORTISONE 5 MG PO TABS
5.0000 mg | ORAL_TABLET | Freq: Two times a day (BID) | ORAL | Status: DC
  Administered 2023-11-07 – 2023-11-08 (×3): 5 mg via ORAL
  Filled 2023-11-07 (×3): qty 1

## 2023-11-07 MED ORDER — PERFLUTREN LIPID MICROSPHERE
1.0000 mL | INTRAVENOUS | Status: AC | PRN
  Administered 2023-11-07: 1 mL via INTRAVENOUS
  Filled 2023-11-07: qty 10

## 2023-11-07 NOTE — Plan of Care (Signed)

## 2023-11-07 NOTE — Progress Notes (Signed)
 PROGRESS NOTE    Joseph Rocha  FMW:[id number] DOB: 05-Mar-1935 DOA: 11/05/2023 PCP: Norleen Lynwood ORN, MD  Outpatient Specialists:     Brief Narrative:  Patient is an 89 year old Caucasian male with past medical history significant for renal insufficiency, history of steroid use (prednisone ), type 2 diabetes mellitus, hypertension, hyperlipidemia and recurrent syncope for over 20 years (according to the patient's wife).  Patient has had loop recorder placed for more than 1 year without significant arrhythmias noted.  Last cortisol level was 1.4 (around 10/09/2023).  Patient is readmitted with GI symptoms, volume depletion, hyponatremia and syncope.  Sodium is 125 and urine specific gravity is 1.020.  Patient is on fludrocortisone  0.1 Mg p.o. once daily.  Patient has not been able to follow-up with an endocrinologist.  11/07/2023: Patient seen alongside patient's wife.  GI symptoms are slowly improving.  Sodium of 125 is noted.  As documented above, patient remains on fludrocortisone  0.1 Mg p.o. once daily.  Echocardiogram is pending.  As documented above, prior cardiac workup is said not to be nonrevealing.   Assessment & Plan:   Principal Problem:   SYNCOPE Active Problems:   Syncope   Adrenal insufficiency: -Clinical syndrome is likely consistent with adrenal insufficiency. -Check cortisol level. -Continue fludrocortisone  0.1 Mg p.o. once daily. -Will have low threshold to add low-dose hydrocortisone . -GI symptoms are improving. -Volume resuscitation. -Patient will need to follow-up with an endocrinologist. -Prior history of prednisone  use noted.  Nausea/vomiting/volume depletion: -Resolving. -Patient seems to be tolerating orally now. -Adequate/aggressive hydration.   Recurrent syncope: -Reoccurring for over 20 years according to the patient's wife. -Negative loop recorder. -Optimize fluid management. -Continue fludrocortisone  and hydrocortisone . -Optimize adrenal  insufficiency management.   -Follow echo report.  Chronic anemia: -No GI bleeding noted. -Stable hemoglobin.  CAD: -Stable.   BPH: -No prostatic symptoms. -Continue current management.   Hypothyroidism: -continue Synthroid  50 mcg p.o. once daily. -Last TSH was 2.86 (on 10/07/2023)   GERD: -Continue Protonix . -Monitor close blood pressure restarted on low-dose hydrocortisone .   Chronic hyponaturemia: -Likely multifactorial. -Check urine sodium. -Check cortisol level. -Check urine osmolality and plasma osmolality. -Suspect secondary to combined volume depletion and adrenal insufficiency. -Cannot rule out SIADH.       DVT prophylaxis: Subcutaneous Lovenox  Code Status: Full code Family Communication: Further at bedside Disposition Plan: Acute DC home in the next 1 to 2 days   Consultants:  None.  Patient will need to follow-up with endocrinology team on discharge  Procedures:  None.  Antimicrobials:  None.   Subjective: -No new complaints.  Objective: Vitals:   11/06/23 1551 11/06/23 2004 11/07/23 0401 11/07/23 1221  BP: 107/63 (!) 151/66 121/73 122/83  Pulse: 69 68 73 68  Resp: 20 18 15 17   Temp: 98.8 F (37.1 C) 98.6 F (37 C) 98 F (36.7 C) (!) 97.4 F (36.3 C)  TempSrc: Oral Oral    SpO2: 99% 99% 97% 95%  Weight: 90.2 kg     Height: 6' 3 (1.905 m)       Intake/Output Summary (Last 24 hours) at 11/07/2023 1259 Last data filed at 11/06/2023 1710 Gross per 24 hour  Intake 914.43 ml  Output --  Net 914.43 ml   Filed Weights   11/05/23 1624 11/06/23 1551  Weight: 86.6 kg 90.2 kg    Examination:  General exam: Appears calm and comfortable  Respiratory system: Clear to auscultation.  Cardiovascular system: S1 & S2 heard. Gastrointestinal system: Abdomen is soft and nontender.   Central nervous  system: Currently alert.  Patient moves all extremities.   Extremities: Mild edema.     Data Reviewed: I have personally reviewed following labs  and imaging studies  CBC: Recent Labs  Lab 11/05/23 1219 11/05/23 1831 11/06/23 0203 11/06/23 0904 11/06/23 1638 11/07/23 0530  WBC 6.3 7.9  --   --   --  7.5  NEUTROABS 3.6  --   --   --   --   --   HGB 12.4* 12.0* 11.5* 11.4* 10.9* 11.6*  HCT 36.5* 34.7* 33.1* 31.9* 32.4* 33.9*  MCV 93.9 90.8  --   --   --  92.9  PLT 225.0 197  --   --   --  179   Basic Metabolic Panel: Recent Labs  Lab 11/05/23 1219 11/05/23 1831 11/07/23 0530  NA 130* 130* 125*  K 4.0 4.1 3.6  CL 98 98 99  CO2 24 20* 21*  GLUCOSE 109* 132* 114*  BUN 18 21 13   CREATININE 1.51* 1.31* 1.10  CALCIUM  9.1 8.9 8.2*  MG 1.7  --   --    GFR: Estimated Creatinine Clearance: 57.6 mL/min (by C-G formula based on SCr of 1.1 mg/dL). Liver Function Tests: Recent Labs  Lab 11/05/23 1219 11/07/23 0530  AST 18 17  ALT 11 13  ALKPHOS 47 42  BILITOT 0.6 0.5  PROT 6.4 6.3*  ALBUMIN 4.0 3.4*   Recent Labs  Lab 11/05/23 1219  LIPASE 16.0   No results for input(s): AMMONIA in the last 168 hours. Coagulation Profile: Recent Labs  Lab 11/07/23 0530  INR 1.2   Cardiac Enzymes: No results for input(s): CKTOTAL, CKMB, CKMBINDEX, TROPONINI in the last 168 hours. BNP (last 3 results) No results for input(s): PROBNP in the last 8760 hours. HbA1C: No results for input(s): HGBA1C in the last 72 hours. CBG: Recent Labs  Lab 11/06/23 1942 11/06/23 2341 11/07/23 0356 11/07/23 0722 11/07/23 1139  GLUCAP 152* 106* 130* 112* 151*   Lipid Profile: No results for input(s): CHOL, HDL, LDLCALC, TRIG, CHOLHDL, LDLDIRECT in the last 72 hours. Thyroid  Function Tests: No results for input(s): TSH, T4TOTAL, FREET4, T3FREE, THYROIDAB in the last 72 hours. Anemia Panel: No results for input(s): VITAMINB12, FOLATE, FERRITIN, TIBC, IRON, RETICCTPCT in the last 72 hours. Urine analysis:    Component Value Date/Time   COLORURINE YELLOW 11/05/2023 1831   APPEARANCEUR  CLEAR 11/05/2023 1831   LABSPEC 1.030 11/05/2023 1831   PHURINE 5.5 11/05/2023 1831   GLUCOSEU NEGATIVE 11/05/2023 1831   GLUCOSEU NEGATIVE 11/05/2023 1219   HGBUR NEGATIVE 11/05/2023 1831   BILIRUBINUR NEGATIVE 11/05/2023 1831   KETONESUR 15 (A) 11/05/2023 1831   PROTEINUR TRACE (A) 11/05/2023 1831   UROBILINOGEN 0.2 11/05/2023 1219   NITRITE NEGATIVE 11/05/2023 1831   LEUKOCYTESUR NEGATIVE 11/05/2023 1831   Sepsis Labs: @LABRCNTIP (procalcitonin:4,lacticidven:4)  ) Recent Results (from the past 240 hours)  Gastrointestinal Panel by PCR , Stool     Status: None   Collection Time: 11/06/23  2:03 AM   Specimen: STOOL  Result Value Ref Range Status   Campylobacter species NOT DETECTED NOT DETECTED Final   Plesimonas shigelloides NOT DETECTED NOT DETECTED Final   Salmonella species NOT DETECTED NOT DETECTED Final   Yersinia enterocolitica NOT DETECTED NOT DETECTED Final   Vibrio species NOT DETECTED NOT DETECTED Final   Vibrio cholerae NOT DETECTED NOT DETECTED Final   Enteroaggregative E coli (EAEC) NOT DETECTED NOT DETECTED Final   Enteropathogenic E coli (EPEC) NOT DETECTED NOT DETECTED Final  Enterotoxigenic E coli (ETEC) NOT DETECTED NOT DETECTED Final   Shiga like toxin producing E coli (STEC) NOT DETECTED NOT DETECTED Final   Shigella/Enteroinvasive E coli (EIEC) NOT DETECTED NOT DETECTED Final   Cryptosporidium NOT DETECTED NOT DETECTED Final   Cyclospora cayetanensis NOT DETECTED NOT DETECTED Final   Entamoeba histolytica NOT DETECTED NOT DETECTED Final   Giardia lamblia NOT DETECTED NOT DETECTED Final   Adenovirus F40/41 NOT DETECTED NOT DETECTED Final   Astrovirus NOT DETECTED NOT DETECTED Final   Norovirus GI/GII NOT DETECTED NOT DETECTED Final   Rotavirus A NOT DETECTED NOT DETECTED Final   Sapovirus (I, II, IV, and V) NOT DETECTED NOT DETECTED Final    Comment: Performed at [facility], [address] [place], [place] [address]  C Difficile Quick  Screen w PCR reflex     Status: None   Collection Time: 11/06/23  2:03 AM   Specimen: STOOL  Result Value Ref Range Status   C Diff antigen NEGATIVE NEGATIVE Final   C Diff toxin NEGATIVE NEGATIVE Final   C Diff interpretation No C. difficile detected.  Final    Comment: Performed at [facility], [address], [place], [place] [address]         Radiology Studies: DG Chest Port 1 View Result Date: 11/05/2023 CLINICAL DATA:  106001 Syncope 106001 EXAM: PORTABLE CHEST 1 VIEW COMPARISON:  03/25/2023 chest radiograph. FINDINGS: Stable cardiomediastinal silhouette with normal heart size. No pneumothorax. No pleural effusion. Lungs appear clear, with no acute consolidative airspace disease and no pulmonary edema. IMPRESSION: No active disease. Electronically Signed   By: Selinda DELENA Blue M.D.   On: 11/05/2023 21:19        Scheduled Meds:  aspirin  EC  81 mg Oral Daily   enoxaparin  (LOVENOX ) injection  40 mg Subcutaneous Q24H   ferrous sulfate   325 mg Oral Q breakfast   fesoterodine   4 mg Oral Daily   finasteride   5 mg Oral Daily   fludrocortisone   0.1 mg Oral Daily   insulin  aspart  0-15 Units Subcutaneous TID WC   levothyroxine   50 mcg Oral Daily   lidocaine   1 patch Transdermal QHS   pantoprazole   40 mg Oral Daily   rosuvastatin   5 mg Oral Daily   tamsulosin   0.4 mg Oral Daily   Continuous Infusions:   LOS: 1 day    Time spent: 55 minutes.    Leatrice Chapel, MD  [facility] Pager #: [phone] 7PM-7AM contact night coverage as above

## 2023-11-07 NOTE — Plan of Care (Signed)
  Problem: Education: Goal: Ability to describe self-care measures that may prevent or decrease complications (Diabetes Survival Skills Education) will improve Outcome: Progressing   Problem: Coping: Goal: Ability to adjust to condition or change in health will improve Outcome: Progressing   Problem: Fluid Volume: Goal: Ability to maintain a balanced intake and output will improve Outcome: Progressing   Problem: Metabolic: Goal: Ability to maintain appropriate glucose levels will improve Outcome: Progressing   Problem: Nutritional: Goal: Maintenance of adequate nutrition will improve Outcome: Progressing   Problem: Clinical Measurements: Goal: Will remain free from infection Outcome: Progressing Goal: Cardiovascular complication will be avoided Outcome: Progressing   Problem: Nutrition: Goal: Adequate nutrition will be maintained Outcome: Progressing   Problem: Elimination: Goal: Will not experience complications related to bowel motility Outcome: Progressing Goal: Will not experience complications related to urinary retention Outcome: Progressing   Problem: Pain Management: Goal: General experience of comfort will improve Outcome: Progressing   Problem: Safety: Goal: Ability to remain free from injury will improve Outcome: Progressing

## 2023-11-08 DIAGNOSIS — R55 Syncope and collapse: Secondary | ICD-10-CM | POA: Diagnosis not present

## 2023-11-08 LAB — CBC WITH DIFFERENTIAL/PLATELET
Abs Immature Granulocytes: 0.01 10*3/uL (ref 0.00–0.07)
Basophils Absolute: 0 10*3/uL (ref 0.0–0.1)
Basophils Relative: 0 %
Eosinophils Absolute: 0.2 10*3/uL (ref 0.0–0.5)
Eosinophils Relative: 3 %
HCT: 32.2 % — ABNORMAL LOW (ref 39.0–52.0)
Hemoglobin: 11 g/dL — ABNORMAL LOW (ref 13.0–17.0)
Immature Granulocytes: 0 %
Lymphocytes Relative: 22 %
Lymphs Abs: 1.2 10*3/uL (ref 0.7–4.0)
MCH: 31.6 pg (ref 26.0–34.0)
MCHC: 34.2 g/dL (ref 30.0–36.0)
MCV: 92.5 fL (ref 80.0–100.0)
Monocytes Absolute: 0.6 10*3/uL (ref 0.1–1.0)
Monocytes Relative: 11 %
Neutro Abs: 3.5 10*3/uL (ref 1.7–7.7)
Neutrophils Relative %: 64 %
Platelets: 197 10*3/uL (ref 150–400)
RBC: 3.48 MIL/uL — ABNORMAL LOW (ref 4.22–5.81)
RDW: 13.1 % (ref 11.5–15.5)
WBC: 5.6 10*3/uL (ref 4.0–10.5)
nRBC: 0 % (ref 0.0–0.2)

## 2023-11-08 LAB — GLUCOSE, CAPILLARY
Glucose-Capillary: 115 mg/dL — ABNORMAL HIGH (ref 70–99)
Glucose-Capillary: 104 mg/dL — ABNORMAL HIGH (ref 70–99)

## 2023-11-08 LAB — RENAL FUNCTION PANEL
Albumin: 3.4 g/dL — ABNORMAL LOW (ref 3.5–5.0)
Anion gap: 8 (ref 5–15)
BUN: 12 mg/dL (ref 8–23)
CO2: 21 mmol/L — ABNORMAL LOW (ref 22–32)
Calcium: 8.8 mg/dL — ABNORMAL LOW (ref 8.9–10.3)
Chloride: 104 mmol/L (ref 98–111)
Creatinine, Ser: 0.78 mg/dL (ref 0.61–1.24)
GFR, Estimated: >60 mL/min (ref >60)
Glucose, Bld: 114 mg/dL — ABNORMAL HIGH (ref 70–99)
Phosphorus: 2.6 mg/dL (ref 2.5–4.6)
Potassium: 3.6 mmol/L (ref 3.5–5.1)
Sodium: 133 mmol/L — ABNORMAL LOW (ref 135–145)

## 2023-11-08 NOTE — Plan of Care (Signed)
  Problem: Health Behavior/Discharge Planning: Goal: Ability to identify and utilize available resources and services will improve Outcome: Progressing   Problem: Metabolic: Goal: Ability to maintain appropriate glucose levels will improve Outcome: Progressing   Problem: Nutritional: Goal: Maintenance of adequate nutrition will improve Outcome: Progressing   Problem: Skin Integrity: Goal: Risk for impaired skin integrity will decrease Outcome: Progressing   Problem: Tissue Perfusion: Goal: Adequacy of tissue perfusion will improve Outcome: Progressing   Problem: Clinical Measurements: Goal: Ability to maintain clinical measurements within normal limits will improve Outcome: Progressing   Problem: Pain Management: Goal: General experience of comfort will improve Outcome: Progressing   Problem: Safety: Goal: Ability to remain free from injury will improve Outcome: Progressing

## 2023-11-08 NOTE — TOC Initial Note (Signed)
 Transition of Care [facility]) - Initial/Assessment Note    Patient Details  Name: Joseph Rocha MRN: [id number] Date of Birth: 1934-11-28  Transition of [facility]) CM/SW Contact:    Sonda Manuella Quill, RN Phone Number: 11/08/2023, 11:50 AM  Clinical Narrative:                 Spoke w/ pt and wife [name] in room; pt says he lives at home; he plans to return at d/c; his wife will provide transportation home; pt verified insurance/PCP; he denies SDOH risks; pt says he has a cane; pt says he does not have HH services, or home oxygen ; no TOC needs.  Expected Discharge Plan: Home/Self Care Barriers to Discharge: No Barriers Identified   Patient Goals and CMS Choice Patient states their goals for this hospitalization and ongoing recovery are:: HOME CMS Medicare.gov Compare Post Acute Care list provided to:: Patient        Expected Discharge Plan and Services   Discharge Planning Services: CM Consult Post Acute Care Choice: NA Living arrangements for the past 2 months: Single Family Home Expected Discharge Date: 11/08/23               DME Arranged: N/A DME Agency: NA       HH Arranged: NA HH Agency: NA        Prior Living Arrangements/Services Living arrangements for the past 2 months: Single Family Home Lives with:: Spouse Patient language and need for interpreter reviewed:: Yes Do you feel safe going back to the place where you live?: Yes      Need for Family Participation in Patient Care: Yes (Comment) Care giver support system in place?: Yes (comment) Current home services: DME (cane) Criminal Activity/Legal Involvement Pertinent to Current Situation/Hospitalization: No - Comment as needed  Activities of Daily Living   ADL Screening (condition at time of admission) Independently performs ADLs?: Yes (appropriate for developmental age) Is the patient deaf or have difficulty hearing?: No Does the patient have difficulty seeing, even when wearing glasses/contacts?:  No Does the patient have difficulty concentrating, remembering, or making decisions?: No  Permission Sought/Granted Permission sought to share information with : Case Manager Permission granted to share information with : Yes, Verbal Permission Granted  Share Information with NAME: Case Manager     Permission granted to share info w Relationship: Adonis Yim (spouse) [phone]     Emotional Assessment Appearance:: Appears stated age   Affect (typically observed): Accepting Orientation: : Oriented to Self, Oriented to Place, Oriented to  Time, Oriented to Situation Alcohol / Substance Use: Not Applicable Psych Involvement: No (comment)  Admission diagnosis:  Syncope and collapse [R55] Upper GI bleed [K92.2] Nausea vomiting and diarrhea [R11.2, R19.7] Syncope, unspecified syncope type [R55] Syncope [R55] Patient Active Problem List   Diagnosis Date Noted   Syncope 11/06/2023   Watery diarrhea 11/06/2023   Dark stools 11/05/2023   Upper abdominal pain 11/05/2023   Elevated CK 10/15/2023   Allergic rhinitis 10/15/2023   Epigastric fullness 10/07/2023   Lack of appetite 10/07/2023   Cramps, extremity 10/07/2023   Nausea without vomiting 10/07/2023   Increased CPK level 10/07/2023   Abdominal pain 10/07/2023   OAB (overactive bladder) 09/06/2023   Right knee pain 07/17/2023   Nocturnal leg cramps 07/17/2023   Primary osteoarthritis of right knee 07/17/2023   Nausea & vomiting 03/29/2023   Hypokalemia 03/29/2023   Wheezing 10/03/2022   Hyperlipidemia associated with type 2 diabetes mellitus (HCC) 09/13/2022   Thoracic back  pain 08/21/2022   Posterior neck pain 08/21/2022   Nausea 12/21/2021   Vertigo 03/24/2021   Vitamin D  deficiency 01/16/2021   TMJ arthralgia 02/09/2020   Sinus bradycardia 12/08/2019   Status post placement of implantable loop recorder 12/08/2019   CKD (chronic kidney disease) stage 3, GFR 30-59 ml/min (HCC) 08/01/2018   Rash 05/27/2018    Hyponatremia 05/27/2018   Diarrhea 02/18/2018   Atrial fibrillation with RVR (HCC) 01/05/2018   Hypomagnesemia 01/05/2018   Constipation 12/05/2017   Hypocalcemia 04/29/2017   Low back pain 11/16/2016   Edema 10/03/2016   Dyspnea 10/03/2016   Hypothyroidism 07/26/2016   Numbness 04/06/2016   Hypotension 11/10/2014   Weight loss 07/07/2014   Sick sinus syndrome (HCC) 03/02/2014   Nausea with vomiting 03/25/2013   Bilateral leg weakness 07/21/2012   Encounter for long-term (current) use of other medications 02/13/2012   Pituitary abnormality (HCC) 02/23/2011   BRADYCARDIA 10/17/2009   DIZZINESS 10/17/2009   DISC DISEASE, CERVICAL 12/28/2008   Hypogonadism male 09/01/2008   Inflammatory and toxic neuropathy (HCC) 09/01/2008   FOOT DROP, RIGHT 09/01/2008   PSA, INCREASED 09/01/2008   PITUITARY MACROADENOMA 05/06/2008   SYNCOPE 05/06/2008   Non-insulin  dependent type 2 diabetes mellitus (HCC) 05/14/2007   Anemia 05/14/2007   Diverticulosis of colon 05/14/2007   Osteoarthritis 05/14/2007   PCP:  Norleen Lynwood ORN, MD Pharmacy:   [facility] - [place], [place] - [address] [facility] AT Portal to [facility] [address] [place] [address] Phone: [phone] Fax: [phone]  [id number] [place], [place] - [address] AT [facility] & [address] [place] [address] Phone: [phone] Fax: [phone]     Social Drivers of Health (SDOH) Social History: SDOH Screenings   Food Insecurity: No Food Insecurity (11/08/2023)  Housing: Low Risk  (11/08/2023)  Transportation Needs: No Transportation Needs (11/08/2023)  Utilities: Not At Risk (11/08/2023)  Alcohol Screen: Low Risk  (06/08/2022)  Depression (PHQ2-9): Low Risk  (11/05/2023)  Financial Resource Strain: Low Risk  (06/08/2022)  Physical Activity: Inactive (06/08/2022)  Social Connections: Socially Integrated (11/06/2023)   Stress: No Stress Concern Present (06/08/2022)  Tobacco Use: Low Risk  (11/06/2023)  Health Literacy: Adequate Health Literacy (10/10/2023)   SDOH Interventions: Food Insecurity Interventions: Intervention Not Indicated, Inpatient TOC Housing Interventions: Intervention Not Indicated, Inpatient TOC Transportation Interventions: Intervention Not Indicated, Inpatient TOC Utilities Interventions: Intervention Not Indicated, Inpatient TOC   Readmission Risk Interventions    11/08/2023   11:48 AM  Readmission Risk Prevention Plan  Transportation Screening Complete  PCP or Specialist Appt within 5-7 Days Complete  Home Care Screening Complete  Medication Review (RN CM) Complete

## 2023-11-08 NOTE — Discharge Summary (Addendum)
 Physician Discharge Summary  Joseph Rocha FMW:[id number] DOB: 23-Sep-1935 DOA: 11/05/2023  PCP: Norleen Lynwood ORN, MD  Admit date: 11/05/2023 Discharge date: 11/08/2023  Admitted From: Home  Discharge disposition: Home   Recommendations for Outpatient Follow-Up:   Follow up with your primary care provider in one week.  Check CBC, BMP, magnesium  in the next visit Patient would benefit from endocrinology follow-up as outpatient. Recommend GI follow-up for his upper GI symptoms. Patient should be encouraged to wear compression stockings and take orthostatic precautions   Discharge Diagnosis:   Principal Problem:   SYNCOPE Active Problems:   Syncope   Discharge Condition: Improved.  Diet recommendation:  Regular.  Wound care: None.  Code status: Full.   History of Present Illness:   Patient is an 89 year old Caucasian male with past medical history significant for renal insufficiency, history of steroid use (prednisone ), type 2 diabetes mellitus, hypertension, hyperlipidemia and recurrent syncope for over 20 years (according to the patient's wife) with loop recorder placed for more than 1 year without significant arrhythmias  presented to the hospital with syncope on 11/06/2023.  He also endorsed nausea vomiting diarrhea with dark stools.  He was seen by cardiology a month prior for syncope and had a loop recorder which was negative.  In the ED initial sodium was 130.  Chest x-ray was negative.  CT scan of the abdomen and pelvis showed diverticulosis. Patient has not been able to follow-up with an endocrinologist. Patient was then considered for admission to hospital for syncope.     Hospital Course:   Following conditions were addressed during hospitalization as listed below,    Recurrent syncope: Orthostatic hypotension. Multiple episodes in the past.  Had seen cardiology recently with negative loop recorder.  Continue fludrocortisone  on discharge.  2D echocardiogram with  LV ejection fraction of 50% with global hypokinesis and LVH with grade 2 diastolic dysfunction.  Patient was able to ambulate without any symptoms.  Telemetry monitoring without any significant arrhythmias.  Received IV fluid hydration during hospitalization with improvement in symptoms.  Spoke in detail regarding orthostatic precautions importance of wearing elastic compression and hydration.  Patient states that this has been going on for some time now and does have compression stockings at home.   Adrenal insufficiency: On fludrocortisone , emphasized compliance with it..  Improved symptoms at this time.  Received volume resuscitation.  Will need follow-up with endocrinology as outpatient.  History of prednisone  use in the past.  Blood pressure has improved at this time.  Acute kidney injury.  Improved with IV hydration.  Likely secondary to volume depletion on presentation.  Initial creatinine of 1.5.  Creatinine prior to discharge was 0.7.   Nausea/vomiting/volume depletion: Improved.  Received hydration.  GI pathogen panel was negative.  C. difficile was negative.  Patient stated that he had some GI issues and had seen GI physician in the past.  Advised him to follow-up on discharge.  Patient has tolerated oral diet at this time.  Chronic anemia: Latest hemoglobin of 11.0.  Occult blood was negative.  CAD: -Stable.  No acute issues.   BPH: Continue Proscar , tamsulosin  on discharge..   Hypothyroidism: -continue Synthroid . Last TSH was 2.86 (on 10/07/2023)   GERD: -Continue Protonix .   Chronic hyponatremia: -Likely multifactorial from adrenal insufficiency, GI symptoms.  Improved at this time.  Serum cortisol was low at 5.0.  Latest sodium of 133.   Disposition.  At this time, patient is stable for disposition home with outpatient PCP follow-up.  Spoke  with the patient's spouse at bedside regarding plan for disposition.  Medical Consultants:   None.  Procedures:     None Subjective:   Today, patient was seen and examined at bedside.  Ambulatory in the hallway had some orthostatic hypotension but states that he has no symptoms and has been chronic.  He does have stockings at home that he is supposed to wear.  Discussed about compliance with Florinef .  Discharge Exam:   Vitals:   11/08/23 1120 11/08/23 1136  BP: 117/76 (!) 156/111  Pulse: 84   Resp:    Temp:    SpO2: 97%    Vitals:   11/08/23 1116 11/08/23 1118 11/08/23 1120 11/08/23 1136  BP: (!) 145/80 134/87 117/76 (!) 156/111  Pulse: 71 71 84   Resp: 18     Temp:      TempSrc:      SpO2: 100% 100% 97%   Weight:      Height:       Body mass index is 24.86 kg/m.  General: Alert awake, not in obvious distress, elderly male, Communicative HENT: pupils equally reacting to light,  No scleral pallor or icterus noted. Oral mucosa is moist.  Chest:  Clear breath sounds.  Diminished breath sounds bilaterally. No crackles or wheezes.  CVS: S1 &S2 heard. No murmur.  Regular rate and rhythm. Abdomen: Soft, nontender, nondistended.  Bowel sounds are heard.   Extremities: No cyanosis, clubbing or edema.  Peripheral pulses are palpable. Psych: Alert, awake and oriented, normal mood CNS:  No cranial nerve deficits.  Power equal in all extremities.   Skin: Warm and dry.  No rashes noted.  The results of significant diagnostics from this hospitalization (including imaging, microbiology, ancillary and laboratory) are listed below for reference.     Diagnostic Studies:   DG Chest Port 1 View Result Date: 11/05/2023 CLINICAL DATA:  106001 Syncope 106001 EXAM: PORTABLE CHEST 1 VIEW COMPARISON:  03/25/2023 chest radiograph. FINDINGS: Stable cardiomediastinal silhouette with normal heart size. No pneumothorax. No pleural effusion. Lungs appear clear, with no acute consolidative airspace disease and no pulmonary edema. IMPRESSION: No active disease. Electronically Signed   By: Selinda DELENA Blue M.D.   On:  11/05/2023 21:19     Labs:   Basic Metabolic Panel: Recent Labs  Lab 11/05/23 1219 11/05/23 1831 11/07/23 0530 11/08/23 0545  NA 130* 130* 125* 133*  K 4.0 4.1 3.6 3.6  CL 98 98 99 104  CO2 24 20* 21* 21*  GLUCOSE 109* 132* 114* 114*  BUN 18 21 13 12   CREATININE 1.51* 1.31* 1.10 0.78  CALCIUM  9.1 8.9 8.2* 8.8*  MG 1.7  --   --   --   PHOS  --   --   --  2.6   GFR Estimated Creatinine Clearance: 79.2 mL/min (by C-G formula based on SCr of 0.78 mg/dL). Liver Function Tests: Recent Labs  Lab 11/05/23 1219 11/07/23 0530 11/08/23 0545  AST 18 17  --   ALT 11 13  --   ALKPHOS 47 42  --   BILITOT 0.6 0.5  --   PROT 6.4 6.3*  --   ALBUMIN 4.0 3.4* 3.4*   Recent Labs  Lab 11/05/23 1219  LIPASE 16.0   No results for input(s): AMMONIA in the last 168 hours. Coagulation profile Recent Labs  Lab 11/07/23 0530  INR 1.2    CBC: Recent Labs  Lab 11/05/23 1219 11/05/23 1831 11/06/23 0203 11/06/23 9095 11/06/23 1638 11/07/23 0530 11/08/23 0546  WBC 6.3 7.9  --   --   --  7.5 5.6  NEUTROABS 3.6  --   --   --   --   --  3.5  HGB 12.4* 12.0* 11.5* 11.4* 10.9* 11.6* 11.0*  HCT 36.5* 34.7* 33.1* 31.9* 32.4* 33.9* 32.2*  MCV 93.9 90.8  --   --   --  92.9 92.5  PLT 225.0 197  --   --   --  179 197   Cardiac Enzymes: No results for input(s): CKTOTAL, CKMB, CKMBINDEX, TROPONINI in the last 168 hours. BNP: Invalid input(s): POCBNP CBG: Recent Labs  Lab 11/07/23 0722 11/07/23 1139 11/07/23 1608 11/07/23 2030 11/08/23 0755  GLUCAP 112* 151* 111* 189* 115*   D-Dimer No results for input(s): DDIMER in the last 72 hours. Hgb A1c No results for input(s): HGBA1C in the last 72 hours. Lipid Profile No results for input(s): CHOL, HDL, LDLCALC, TRIG, CHOLHDL, LDLDIRECT in the last 72 hours. Thyroid  function studies No results for input(s): TSH, T4TOTAL, T3FREE, THYROIDAB in the last 72 hours.  Invalid input(s): FREET3 Anemia  work up No results for input(s): VITAMINB12, FOLATE, FERRITIN, TIBC, IRON, RETICCTPCT in the last 72 hours. Microbiology Recent Results (from the past 240 hours)  Gastrointestinal Panel by PCR , Stool     Status: None   Collection Time: 11/06/23  2:03 AM   Specimen: STOOL  Result Value Ref Range Status   Campylobacter species NOT DETECTED NOT DETECTED Final   Plesimonas shigelloides NOT DETECTED NOT DETECTED Final   Salmonella species NOT DETECTED NOT DETECTED Final   Yersinia enterocolitica NOT DETECTED NOT DETECTED Final   Vibrio species NOT DETECTED NOT DETECTED Final   Vibrio cholerae NOT DETECTED NOT DETECTED Final   Enteroaggregative E coli (EAEC) NOT DETECTED NOT DETECTED Final   Enteropathogenic E coli (EPEC) NOT DETECTED NOT DETECTED Final   Enterotoxigenic E coli (ETEC) NOT DETECTED NOT DETECTED Final   Shiga like toxin producing E coli (STEC) NOT DETECTED NOT DETECTED Final   Shigella/Enteroinvasive E coli (EIEC) NOT DETECTED NOT DETECTED Final   Cryptosporidium NOT DETECTED NOT DETECTED Final   Cyclospora cayetanensis NOT DETECTED NOT DETECTED Final   Entamoeba histolytica NOT DETECTED NOT DETECTED Final   Giardia lamblia NOT DETECTED NOT DETECTED Final   Adenovirus F40/41 NOT DETECTED NOT DETECTED Final   Astrovirus NOT DETECTED NOT DETECTED Final   Norovirus GI/GII NOT DETECTED NOT DETECTED Final   Rotavirus A NOT DETECTED NOT DETECTED Final   Sapovirus (I, II, IV, and V) NOT DETECTED NOT DETECTED Final    Comment: Performed at [facility], [address] [place], [place] [address]  C Difficile Quick Screen w PCR reflex     Status: None   Collection Time: 11/06/23  2:03 AM   Specimen: STOOL  Result Value Ref Range Status   C Diff antigen NEGATIVE NEGATIVE Final   C Diff toxin NEGATIVE NEGATIVE Final   C Diff interpretation No C. difficile detected.  Final    Comment: Performed at [facility], [address], [place], [place] [address]      Discharge Instructions:   Discharge Instructions     Diet - low sodium heart healthy   Complete by: As directed    Discharge instructions   Complete by: As directed    Follow up with primary care provider in one week. Take precautions while standing up from lying down. Keep hydrated. Seek medical attention for worsening symptoms. Follow up with  your GI physician for stomach issues including vomiting.   Increase activity slowly   Complete by: As directed       Allergies as of 11/08/2023       Reactions   Lipitor [atorvastatin ] Other (See Comments)   Myalgias, cramps in hand        Medication List     TAKE these medications    aspirin  EC 81 MG tablet Take 1 tablet (81 mg total) by mouth daily. Swallow whole.   calcium  carbonate 600 MG Tabs tablet Commonly known as: OS-CAL Take 600 mg by mouth daily.   cetirizine  10 MG tablet Commonly known as: ZYRTEC  Take 1 tablet (10 mg total) by mouth daily.   cyclobenzaprine  5 MG tablet Commonly known as: FLEXERIL  Take 1 tablet (5 mg total) by mouth 3 (three) times daily as needed.   ferrous sulfate  325 (65 FE) MG EC tablet Take 325 mg by mouth daily.   finasteride  5 MG tablet Commonly known as: PROSCAR  TAKE 1 TABLET DAILY   Fish Oil  1000 MG Caps Take 1 capsule by mouth daily.   fludrocortisone  0.1 MG tablet Commonly known as: FLORINEF  Take 1 tablet (100 mcg total) by mouth daily.   fluticasone  50 MCG/ACT nasal spray Commonly known as: FLONASE  Place 2 sprays into both nostrils daily.   Klor-Con  M20 20 MEQ tablet Generic drug: potassium chloride  SA Take 20 mEq by mouth daily.   loperamide  2 MG tablet Commonly known as: IMODIUM  A-D Take 1 tablet (2 mg total) by mouth 2 (two) times daily as needed for diarrhea or loose stools.   meloxicam  7.5 MG tablet Commonly known as: MOBIC  Take 1 tablet (7.5 mg total) by mouth daily as needed for pain.   metFORMIN  500 MG tablet Commonly known as: GLUCOPHAGE  TAKE 1  TABLET TWICE DAILY  WITH MEALS   omeprazole  40 MG capsule Commonly known as: PRILOSEC TAKE 1 CAPSULE DAILY   ondansetron  4 MG disintegrating tablet Commonly known as: ZOFRAN -ODT Take 1 tablet (4 mg total) by mouth every 8 (eight) hours as needed for nausea or vomiting.   repaglinide  2 MG tablet Commonly known as: PRANDIN  TAKE 1 TABLET TWICE DAILY  BEFORE MEALS What changed: See the new instructions.   rosuvastatin  5 MG tablet Commonly known as: CRESTOR  TAKE 1 TABLET DAILY   solifenacin  5 MG tablet Commonly known as: VESICARE  Take 1 tablet (5 mg total) by mouth daily.   Synthroid  50 MCG tablet Generic drug: levothyroxine  TAKE 1 TABLET DAILY   tamsulosin  0.4 MG Caps capsule Commonly known as: FLOMAX  Take 0.4 mg by mouth daily.   Testosterone  30 MG/ACT Soln Place 2 Pump onto the skin daily.   traMADol  50 MG tablet Commonly known as: ULTRAM  Take 1 tablet (50 mg total) by mouth every 6 (six) hours as needed.        Follow-up Information     Norleen Lynwood ORN, MD Follow up in 1 week(s).   Specialties: Internal Medicine, Radiology Contact information: [address] [place] [address] [phone]                  Time coordinating discharge: 39 minutes  Signed:  Trinton Prewitt  [facility] 11/08/2023, 11:40 AM

## 2023-11-08 NOTE — Consult Note (Signed)
 [facility] Liaison Consult Note    11/08/2023  Joseph Rocha 1935-02-18 [id number]  Insurance: Medicare ACO [place]   Primary Care Provider: Norleen Lynwood ORN, MD with [facility], this provider is listed for the transition of care follow up appointments  and TOC follow up calls   [facility] Liaison screened the patient remotely at [facility].    The patient was screened for 30 day readmission hospitalization with noted medium risk score for unplanned readmission risk 2 hospital admissions in 6 months.  The patient was assessed for potential Community Care Coordination service needs for post hospital transition for care coordination. Review of patient's electronic medical record reveals patient is noted with DM.   Plan: [facility] Liaison will continue to follow progress and disposition to asess for post hospital community care coordination/management needs.    Referral request for community care coordination: anticipate post hospital community [facility] team to follow up.  Patient noted recently offered 30 day TOC follow up but noted as declined at that time. Will follow up.   [facility] Community Care, Population Health does not replace or interfere with any arrangements made by the Inpatient Transition of Care team.   For questions contact:   Richerd Fish, RN, BSN, CCM [place]  [facility], [facility] Liaison Direct Dial: [phone] or secure chat Email: [email or web address]

## 2023-11-11 ENCOUNTER — Telehealth: Payer: Self-pay

## 2023-11-11 ENCOUNTER — Telehealth: Payer: Self-pay | Admitting: *Deleted

## 2023-11-11 NOTE — Transitions of Care (Post Inpatient/ED Visit) (Signed)
 11/11/2023  Name: Joseph Rocha MRN: [id number] DOB: [birth year]  Today's TOC FU Call Status: Today's TOC FU Call Status:: Successful TOC FU Call Completed TOC FU Call Complete Date: 11/11/23 Patient's Name and Date of Birth confirmed.  Transition Care Management Follow-up Telephone Call Date of Discharge: 11/08/23 Discharge Facility: [facility]) Type of Discharge: Inpatient Admission Primary Inpatient Discharge Diagnosis:: syncope How have you been since you were released from the hospital?: Better (I am doing just fine.  They didn't change any of my medicines.  I can't review medicine today- I am hust eating my breakfast.  I don't want to see any doctor except Dr. Norleen.  I am independent in my care and my wife helps me if I need anything) Any questions or concerns?: No  Items Reviewed: Did you receive and understand the discharge instructions provided?: Yes (thoroughly reviewed with patient who verbalizes good understanding of same) Medications obtained,verified, and reconciled?: No Medications Not Reviewed Reasons:: Other: (patient adamantly declined full medication review- he denies questions/ concerns around medications today and reports he self-manages medications at home) Any new allergies since your discharge?: No Dietary orders reviewed?: Yes Type of Diet Ordered:: Regular Do you have support at home?: Yes People in Home: spouse Name of Support/Comfort Primary Source: Reports independent in self-care activities; resides with supportive spouse assists as/ if needed/ indicated; local daughter is retired [employer] and also assists as indicated  Medications Reviewed Today: Medications Reviewed Today     Reviewed by Meryl Ponder M, RN (Registered Nurse) on 11/11/23 at 1129  Med List Status: <None>   Medication Order Taking? Sig Documenting Provider Last Dose Status Informant  aspirin  EC 81 MG tablet [id number] No Take 1 tablet (81 mg total) by mouth daily. Swallow whole. Norleen Lynwood ORN, MD 11/05/2023 Active Self, Pharmacy Records           Med Note (Sandie Swayze M   Mon Nov 11, 2023 11:29 AM) 11/11/23: Patient declined medication review at time of TOC call today  calcium  carbonate (OS-CAL) 600 MG TABS [id number] No Take 600 mg by mouth daily. [provider] 11/04/2023 Active Self, Pharmacy Records  cetirizine  (ZYRTEC ) 10 MG tablet [id number] No Take 1 tablet (10 mg total) by mouth daily. Norleen Lynwood ORN, MD 11/04/2023 Active Self, Pharmacy Records  cyclobenzaprine  (FLEXERIL ) 5 MG tablet [id number] No Take 1 tablet (5 mg total) by mouth 3 (three) times daily as needed. Norleen Lynwood ORN, MD Taking Active Self, Pharmacy Records  ferrous sulfate  325 [phone] FE) MG EC tablet [id number] No Take 325 mg by mouth daily. [provider] 11/04/2023 Active Self, Pharmacy Records  finasteride  (PROSCAR ) 5 MG tablet [id number] No TAKE 1 TABLET DAILY  Patient taking differently: Take 5 mg by mouth daily.   Norleen Lynwood ORN, MD 11/04/2023 Active Self, Pharmacy Records  fludrocortisone  (FLORINEF ) 0.1 MG tablet [id number] No Take 1 tablet (100 mcg total) by mouth daily. Odell Celinda Balo, MD 11/04/2023 Active Self, Pharmacy Records  fluticasone  (FLONASE ) 50 MCG/ACT nasal spray [id number] No Place 2 sprays into both nostrils daily. Rollene Almarie LABOR, MD 11/04/2023 Active Self, Pharmacy Records  KLOR-CON  M20 20 MEQ tablet [id number] No Take 20 mEq by mouth daily.  [provider] 11/04/2023 Active Self, Pharmacy Records  levothyroxine  (SYNTHROID ) 50 MCG tablet [id number] No TAKE 1 TABLET DAILY Norleen Lynwood ORN, MD 11/04/2023 Active Self, Pharmacy Records  loperamide  (IMODIUM  A-D) 2 MG tablet [id number] No Take 1 tablet (2 mg total) by mouth 2 (two) times  daily as needed for diarrhea or loose stools. Jerrol Agent, MD Taking Active Self, Pharmacy Records           Med Note [facility] [place], [place] A   Wed Nov 06, 2023  9:34 PM)    meloxicam  (MOBIC ) 7.5 MG tablet [id number] No Take 1  tablet (7.5 mg total) by mouth daily as needed for pain. Norleen Agent ORN, MD Past Month Active Self, Pharmacy Records  metFORMIN  (GLUCOPHAGE ) 500 MG tablet [id number] No TAKE 1 TABLET TWICE DAILY  WITH MEALS Trixie File, MD 11/04/2023 Active Self, Pharmacy Records           Med Note [facility] [place], [place] A   Wed Nov 06, 2023  9:34 PM)    Omega-3 Fatty Acids (FISH OIL ) 1000 MG CAPS [id number] No Take 1 capsule by mouth daily. [provider] 11/04/2023 Active Self, Pharmacy Records  omeprazole  [facility]) 40 MG capsule [id number] No TAKE 1 CAPSULE DAILY Norleen Agent ORN, MD 11/04/2023 Active Self, Pharmacy Records  ondansetron  (ZOFRAN -ODT) 4 MG disintegrating tablet [id number] No Take 1 tablet (4 mg total) by mouth every 8 (eight) hours as needed for nausea or vomiting. Beather Delon Gibson, [place] Taking Active Self, Pharmacy Records  repaglinide  (PRANDIN ) 2 MG tablet [id number] No TAKE 1 TABLET TWICE DAILY  BEFORE MEALS  Patient taking differently: Take 2 mg by mouth 2 (two) times daily before a meal.   Norleen Agent ORN, MD 11/04/2023 Active Self, Pharmacy Records  rosuvastatin  (CRESTOR ) 5 MG tablet [id number] No TAKE 1 TABLET DAILY  Patient taking differently: Take 5 mg by mouth daily.   Norleen Agent ORN, MD 11/04/2023 Active Self, Pharmacy Records  solifenacin  (VESICARE ) 5 MG tablet [id number] No Take 1 tablet (5 mg total) by mouth daily. Norleen Agent ORN, MD 11/04/2023 Active Self, Pharmacy Records  Tamsulosin  HCl (FLOMAX ) 0.4 MG CAPS [id number] No Take 0.4 mg by mouth daily. [provider] 11/04/2023 Active Self, Pharmacy Records  Testosterone  30 MG/ACT SOLN [id number] No Place 2 Pump onto the skin daily. [provider] 11/04/2023 Active Self, Pharmacy Records  traMADol  (ULTRAM ) 50 MG tablet [id number] No Take 1 tablet (50 mg total) by mouth every 6 (six) hours as needed. Norleen Agent ORN, MD Past Month Active Self, Pharmacy Records           Home Care and Equipment/Supplies: Were  Home Health Services Ordered?: No Any new equipment or medical supplies ordered?: No  Functional Questionnaire: Do you need assistance with bathing/showering or dressing?: No Do you need assistance with meal preparation?: No Do you need assistance with eating?: No Do you have difficulty maintaining continence: No Do you need assistance with getting out of bed/getting out of a chair/moving?: No Do you have difficulty managing or taking your medications?: No  Follow up appointments reviewed: PCP Follow-up appointment confirmed?: Yes (care coordination outreach in real-time with scheduling care guide to successfully schedule hospital follow up PCP appointment 11/27/23- first available with PCP- patient does not want to see any other providers except PCP/ Dr. Norleen) Date of PCP follow-up appointment?: 11/27/23 Follow-up Provider: PCP Dr. Norleen Specialist [facility] Follow-up appointment confirmed?: Yes Date of Specialist follow-up appointment?: 12/11/23 (verified this is recommended time frame for follow up per hospital discharging provider notes) Follow-Up Specialty Provider:: GI provider Do you need transportation to your follow-up appointment?: No Do you understand care options if your condition(s) worsen?: Yes-patient verbalized understanding  SDOH Interventions Today    Flowsheet Row Most Recent Value  SDOH Interventions   Food  Insecurity Interventions Intervention Not Indicated  Housing Interventions Intervention Not Indicated  Transportation Interventions Intervention Not Indicated  [drives self at baseline,  spouse and local daughter assists as/ if needed]  Utilities Interventions Intervention Not Indicated      Interventions Today    Flowsheet Row Most Recent Value  Chronic Disease   Chronic disease during today's visit Other  [recurrent syncope]  General Interventions   General Interventions Discussed/Reviewed General Interventions Discussed, Durable Medical Equipment (DME),  Doctor Visits  Doctor Visits Discussed/Reviewed Doctor Visits Discussed, PCP  Durable Medical Equipment (DME) Other  [confirmed currently requiring/ using assistive devices for ambulation -- cane as needed,  mostly when I leave the house]  PCP/Specialist Visits Compliance with follow-up visit  Education Interventions   Education Provided Provided Education  Provided Verbal Education On When to see the doctor  Nutrition Interventions   Nutrition Discussed/Reviewed Nutrition Discussed  Pharmacy Interventions   Pharmacy Dicussed/Reviewed Pharmacy Topics Discussed  Safety Interventions   Safety Discussed/Reviewed Safety Discussed, Fall Risk       TOC Interventions Today    Flowsheet Row Most Recent Value  TOC Interventions   TOC Interventions Discussed/Reviewed TOC Interventions Discussed  [care coordination outreach in real-time with scheduling care guide to successfully schedule hospital follow up PCP appointment 11/27/23: patient declined offers to see covering provider sooner- only wants to see PCP/ Dr. John]      Patient declines need for ongoing/ further care management outreach; adamantly declines enrollment in 30-day TOC program; provided my direct contact information should questions/ concerns/ needs arise post-TOC call   Deaisa Merida Mckinney Sherlin Sonier, RN, BSN, CCRN Alumnus RN Care Manager  Transitions of Care  [facility] [phone]: direct office

## 2023-11-11 NOTE — Telephone Encounter (Signed)
 Copied from CRM [id number]. Topic: Clinical - Medication Question >> Nov 11, 2023  3:26 PM Brittany M wrote:  Reason for CRM: Patient is asking for a nurse to call him to go over which medications he is supposed to be taking and which ones he is not supposed to take. He said he is confused on them since he got out of the hospital and does not want to mess up his medications for the month.

## 2023-11-20 NOTE — Telephone Encounter (Signed)
 Pt is scheduled to come in for an appt to go over what meds he should be taking with provider.

## 2023-11-22 ENCOUNTER — Telehealth: Payer: Self-pay | Admitting: Internal Medicine

## 2023-11-22 ENCOUNTER — Other Ambulatory Visit: Payer: Self-pay

## 2023-11-22 DIAGNOSIS — E1165 Type 2 diabetes mellitus with hyperglycemia: Secondary | ICD-10-CM

## 2023-11-22 MED ORDER — METFORMIN HCL 500 MG PO TABS: 500.0000 mg | ORAL_TABLET | Freq: Two times a day (BID) | ORAL | 3 refills | Status: DC

## 2023-11-22 MED ORDER — REPAGLINIDE 2 MG PO TABS: 2.0000 mg | ORAL_TABLET | Freq: Two times a day (BID) | ORAL | 3 refills | Status: DC

## 2023-11-22 NOTE — Telephone Encounter (Signed)
Refills sent to [facility].

## 2023-11-22 NOTE — Telephone Encounter (Unsigned)
Copied from CRM [id number]. Topic: Clinical - Prescription Issue >> Nov 22, 2023  2:25 PM Isabell A wrote: Reason for CRM: Patient states [facility] has been trying to get in touch with the office in regard to his prescriptions: metFORMIN (GLUCOPHAGE) 500 MG tablet, repaglinide (PRANDIN) 2 MG tablet. Patient wanted to provide Dr.John with [facility] phone number [phone].

## 2023-11-27 ENCOUNTER — Inpatient Hospital Stay: Payer: Medicare Other | Admitting: Internal Medicine

## 2023-11-27 DIAGNOSIS — R972 Elevated prostate specific antigen [PSA]: Secondary | ICD-10-CM | POA: Diagnosis not present

## 2023-11-27 DIAGNOSIS — E2749 Other adrenocortical insufficiency: Secondary | ICD-10-CM | POA: Diagnosis not present

## 2023-11-27 DIAGNOSIS — I10 Essential (primary) hypertension: Secondary | ICD-10-CM | POA: Diagnosis not present

## 2023-11-27 DIAGNOSIS — Z9889 Other specified postprocedural states: Secondary | ICD-10-CM | POA: Diagnosis not present

## 2023-11-27 DIAGNOSIS — E1165 Type 2 diabetes mellitus with hyperglycemia: Secondary | ICD-10-CM | POA: Diagnosis not present

## 2023-11-27 DIAGNOSIS — E291 Testicular hypofunction: Secondary | ICD-10-CM | POA: Diagnosis not present

## 2023-11-27 DIAGNOSIS — N183 Chronic kidney disease, stage 3 unspecified: Secondary | ICD-10-CM | POA: Diagnosis not present

## 2023-11-27 DIAGNOSIS — E039 Hypothyroidism, unspecified: Secondary | ICD-10-CM | POA: Diagnosis not present

## 2023-11-27 DIAGNOSIS — E559 Vitamin D deficiency, unspecified: Secondary | ICD-10-CM | POA: Diagnosis not present

## 2023-12-11 ENCOUNTER — Encounter: Payer: Self-pay | Admitting: Internal Medicine

## 2023-12-11 ENCOUNTER — Ambulatory Visit (INDEPENDENT_AMBULATORY_CARE_PROVIDER_SITE_OTHER): Payer: Medicare Other | Admitting: Internal Medicine

## 2023-12-11 VITALS — BP 104/60 | HR 112 | Ht 75.0 in | Wt 202.0 lb

## 2023-12-11 DIAGNOSIS — R634 Abnormal weight loss: Secondary | ICD-10-CM

## 2023-12-11 DIAGNOSIS — R112 Nausea with vomiting, unspecified: Secondary | ICD-10-CM | POA: Diagnosis not present

## 2023-12-11 DIAGNOSIS — K219 Gastro-esophageal reflux disease without esophagitis: Secondary | ICD-10-CM | POA: Diagnosis not present

## 2023-12-11 NOTE — Patient Instructions (Signed)
 You have been scheduled for an endoscopy. Please follow written instructions given to you at your visit today.  If you use inhalers (even only as needed), please bring them with you on the day of your procedure.  If you take any of the following medications, they will need to be adjusted prior to your procedure:   DO NOT TAKE 7 DAYS PRIOR TO TEST- Trulicity (dulaglutide) Ozempic, Wegovy (semaglutide ) Mounjaro (tirzepatide) Bydureon Bcise (exanatide extended release)  DO NOT TAKE 1 DAY PRIOR TO YOUR TEST Rybelsus  (semaglutide ) Adlyxin (lixisenatide) Victoza (liraglutide) Byetta (exanatide) ___________________________________________________________________________   _______________________________________________________  If your blood pressure at your visit was 140/90 or greater, please contact your primary care physician to follow up on this.  _______________________________________________________  If you are age 49 or older, your body mass index should be between 23-30. Your Body mass index is 25.25 kg/m. If this is out of the aforementioned range listed, please consider follow up with your Primary Care Provider.  If you are age 43 or younger, your body mass index should be between 19-25. Your Body mass index is 25.25 kg/m. If this is out of the aformentioned range listed, please consider follow up with your Primary Care Provider.   ________________________________________________________  The [facility] providers would like to encourage you to use MYCHART to communicate with providers for non-urgent requests or questions.  Due to long hold times on the telephone, sending your provider a message by [facility] may be a faster and more efficient way to get a response.  Please allow 48 business hours for a response.  Please remember that this is for non-urgent requests.  _______________________________________________________

## 2023-12-11 NOTE — Progress Notes (Signed)
 HISTORY OF PRESENT ILLNESS:  Joseph Rocha is a 89 y.o. male, husband of Joseph Rocha and previous patient of Dr. Aneita, who presents today for follow-up and to establish care with myself.  Patient was last seen in the office October 28, 2023 regarding weight loss, nausea and vomiting.  See that dictation.  A CT scan of the abdomen and pelvis was performed November 01, 2023.  There were no acute findings.  He was continued on omeprazole  40 mg daily for known GERD.  Also, Zofran  as needed.  He was subsequently hospitalized December 31 through November 08, 2023 for syncope.  Reviewed.  At that time he was having ongoing upper GI complaints as above.  It was recommended that he follow-up with GI and may benefit from diagnostic upper endoscopy.  Blood work from November 08, 2023 showed mild anemia with hemoglobin 11.0.  Unremarkable basic metabolic panel.  He did undergo colonoscopy in 2012 which was normal except for diverticulosis and hemorrhoids.  Patient is accompanied today by his wife.  He reports that since his hospital discharge he is actually been feeling better.  He continues on PPI.  Starting to gain weight.  With occasional nausea but no vomiting.  Because of recurrent problems with symptoms, he is wondering if upper endoscopy may be helpful.  No esophageal dysphagia reported.  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 He has never used smokeless tobacco. He reports that he does not drink alcohol and does not use drugs.  family history includes Cancer in his brother; Diabetes Mellitus II in his mother; Healthy in his daughter; Heart attack in his father.  Allergies  Allergen Reactions   Lipitor [Atorvastatin ] Other (See Comments)    Myalgias, cramps in hand       PHYSICAL EXAMINATION: Vital signs: BP 104/60   Pulse (!) 112   Ht 6' 3 (1.905 m)   Wt 202 lb (91.6 kg)   BMI 25.25 kg/m (up 6 pounds from previous visit) Constitutional: generally well-appearing, no acute distress Psychiatric: alert and oriented x3, cooperative Eyes: extraocular movements intact, anicteric, conjunctiva pink Mouth: oral pharynx moist, no lesions Neck: supple no lymphadenopathy Cardiovascular: heart regular rate and rhythm, no murmur Lungs: clear to auscultation bilaterally Abdomen: soft, nontender, nondistended, no obvious ascites, no peritoneal signs, normal bowel sounds, no organomegaly.  No succussion splash Rectal: Omitted Extremities: no clubbing, cyanosis, or lower extremity edema bilaterally Skin: no lesions on visible extremities Neuro: No focal deficits.  Cranial nerves intact  ASSESSMENT:  1.  Problems with recurrent nausea, vomiting and weight loss.  Stable at this time. 2.  GERD.  On PPI. 3.  Multiple medical problems 4.  Colonoscopy 2012 with diverticulosis   PLAN:  1.  Reflux precautions 2.  Continue PPI 3.  Schedule upper endoscopy to rule out any significant upper GI mucosal pathology given recurrent problems as outlined.The nature of the procedure, as well as the risks,  benefits, and alternatives were carefully and thoroughly reviewed with the patient. Ample time for discussion and questions allowed. The patient understood, was satisfied, and agreed to proceed. A total time of 30 minutes was spent preparing to see the patient, reviewing [facility]ita records, obtaining interval history, performing medically appropriate physical examination, counseling and educating the patient and his wife regarding the above listed issues, ordering upper endoscopy, and documenting clinical information in the health record

## 2023-12-19 ENCOUNTER — Encounter: Payer: Self-pay | Admitting: Internal Medicine

## 2023-12-19 ENCOUNTER — Other Ambulatory Visit (INDEPENDENT_AMBULATORY_CARE_PROVIDER_SITE_OTHER): Payer: Medicare Other

## 2023-12-19 ENCOUNTER — Ambulatory Visit (INDEPENDENT_AMBULATORY_CARE_PROVIDER_SITE_OTHER): Payer: Medicare Other | Admitting: Internal Medicine

## 2023-12-19 VITALS — BP 120/68 | HR 76 | Temp 97.9°F | Ht 75.0 in | Wt 201.0 lb

## 2023-12-19 DIAGNOSIS — E2740 Unspecified adrenocortical insufficiency: Secondary | ICD-10-CM | POA: Diagnosis not present

## 2023-12-19 DIAGNOSIS — N1831 Chronic kidney disease, stage 3a: Secondary | ICD-10-CM | POA: Diagnosis not present

## 2023-12-19 DIAGNOSIS — M1711 Unilateral primary osteoarthritis, right knee: Secondary | ICD-10-CM | POA: Diagnosis not present

## 2023-12-19 DIAGNOSIS — E039 Hypothyroidism, unspecified: Secondary | ICD-10-CM

## 2023-12-19 DIAGNOSIS — E559 Vitamin D deficiency, unspecified: Secondary | ICD-10-CM

## 2023-12-19 LAB — CBC WITH DIFFERENTIAL/PLATELET
Basophils Absolute: 0.1 10*3/uL (ref 0.0–0.1)
Basophils Relative: 0.6 % (ref 0.0–3.0)
Eosinophils Absolute: 0.2 10*3/uL (ref 0.0–0.7)
Eosinophils Relative: 2.3 % (ref 0.0–5.0)
HCT: 35.8 % — ABNORMAL LOW (ref 39.0–52.0)
Hemoglobin: 11.8 g/dL — ABNORMAL LOW (ref 13.0–17.0)
Lymphocytes Relative: 26.4 % (ref 12.0–46.0)
Lymphs Abs: 2.2 10*3/uL (ref 0.7–4.0)
MCHC: 33 g/dL (ref 30.0–36.0)
MCV: 94.5 fL (ref 78.0–100.0)
Monocytes Absolute: 0.9 10*3/uL (ref 0.1–1.0)
Monocytes Relative: 11.3 % (ref 3.0–12.0)
Neutro Abs: 5 10*3/uL (ref 1.4–7.7)
Neutrophils Relative %: 59.4 % (ref 43.0–77.0)
Platelets: 219 10*3/uL (ref 150.0–400.0)
RBC: 3.79 Mil/uL — ABNORMAL LOW (ref 4.22–5.81)
RDW: 13.5 % (ref 11.5–15.5)
WBC: 8.4 10*3/uL (ref 4.0–10.5)

## 2023-12-19 MED ORDER — PREDNISONE 10 MG PO TABS
ORAL_TABLET | ORAL | 0 refills | Status: DC
Start: 2023-12-19 — End: 2023-12-31

## 2023-12-19 MED ORDER — HYDROCODONE-ACETAMINOPHEN 5-325 MG PO TABS: ORAL_TABLET | ORAL | 0 refills | Status: DC

## 2023-12-19 MED ORDER — METHYLPREDNISOLONE ACETATE 80 MG/ML IJ SUSP
80.0000 mg | Freq: Once | INTRAMUSCULAR | Status: AC
  Administered 2023-12-19: 80 mg via INTRAMUSCULAR

## 2023-12-19 NOTE — Patient Instructions (Signed)
You had the steroid shot today  Please take all new medication as prescribed - the prednisone, and pain medicine as needed  Please continue all other medications as before, and refills have been done if requested.  Please have the pharmacy call with any other refills you may need.  Please keep your appointments with your specialists as you may have planned  Please go to the LAB at the blood drawing area for the tests to be done  You will be contacted by phone if any changes need to be made immediately.  Otherwise, you will receive a letter about your results with an explanation, but please check with MyChart first.  Please make an Appointment to return in 4 months, or sooner if needed

## 2023-12-19 NOTE — Progress Notes (Signed)
Patient ID: Joseph Rocha, male   DOB: [birth year], 89 y.o.   MRN: [id number]        Chief Complaint: follow up right knee pain swelling, adrenal insufficiency, ckd3a, low thryoid, low vit d       HPI:  Joseph Rocha is a 89 y.o. male here with c/o 4 days onset severe sudden right knee pain swelling without trauma, fever, overuse.  Pt denies chest pain, increased sob or doe, wheezing, orthopnea, PND, increased LE swelling, palpitations, dizziness or syncope.   Pt denies polydipsia, polyuria, or new focal neuro s/s.    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 He has never used smokeless tobacco. He reports that he does not drink alcohol and does not use drugs. family history includes Cancer in his brother; Diabetes Mellitus II in his mother; Healthy in his daughter; Heart attack in his father. Allergies  Allergen Reactions    Lipitor [Atorvastatin] Other (See Comments)    Myalgias, cramps in hand   Current Outpatient Medications on File Prior to Visit  Medication Sig Dispense Refill   aspirin EC 81 MG tablet Take 1 tablet (81 mg total) by mouth daily. Swallow whole. 30 tablet 11   calcium carbonate (OS-CAL) 600 MG TABS Take 600 mg by mouth daily.     cetirizine (ZYRTEC) 10 MG tablet Take 1 tablet (10 mg total) by mouth daily. 30 tablet 11   cyclobenzaprine (FLEXERIL) 5 MG tablet Take 1 tablet (5 mg total) by mouth 3 (three) times daily as needed. 40 tablet 2   ferrous sulfate 325 (65 FE) MG EC tablet Take 325 mg by mouth daily.     finasteride (PROSCAR) 5 MG tablet TAKE 1 TABLET DAILY (Patient taking differently: Take 5 mg by mouth daily.) 90 tablet 1   fludrocortisone (FLORINEF) 0.1 MG tablet Take 1 tablet (100 mcg total) by mouth daily. 90 tablet 1   fluticasone (FLONASE) 50 MCG/ACT nasal spray Place 2 sprays into both nostrils daily. 16 g 6   KLOR-CON M20 20 MEQ tablet Take 20 mEq by mouth daily.      levothyroxine (SYNTHROID) 50 MCG tablet TAKE 1 TABLET DAILY 90 tablet 2   loperamide (IMODIUM A-D) 2 MG tablet  Take 1 tablet (2 mg total) by mouth 2 (two) times daily as needed for diarrhea or loose stools. 30 tablet 3   meloxicam (MOBIC) 7.5 MG tablet Take 1 tablet (7.5 mg total) by mouth daily as needed for pain. 90 tablet 1   metFORMIN (GLUCOPHAGE) 500 MG tablet Take 1 tablet (500 mg total) by mouth 2 (two) times daily with a meal. 180 tablet 3   Omega-3 Fatty Acids (FISH OIL) 1000 MG CAPS Take 1 capsule by mouth daily.     omeprazole (PRILOSEC) 40 MG capsule TAKE 1 CAPSULE DAILY 90 capsule 2   ondansetron (ZOFRAN-ODT) 4 MG disintegrating tablet Take 1 tablet (4 mg total) by mouth every 8 (eight) hours as needed for nausea or vomiting. 30 tablet 1   repaglinide (PRANDIN) 2 MG tablet Take 1 tablet (2 mg total) by mouth 2 (two) times daily before a meal. TAKE 1 TABLET TWICE DAILY  BEFORE MEALS 180 tablet 3    rosuvastatin (CRESTOR) 5 MG tablet TAKE 1 TABLET DAILY (Patient taking differently: Take 5 mg by mouth daily.) 90 tablet 3   solifenacin (VESICARE) 5 MG tablet Take 1 tablet (5 mg total) by mouth daily. 90 tablet 3   Tamsulosin HCl (FLOMAX) 0.4 MG CAPS Take 0.4 mg by mouth daily.     Testosterone 30 MG/ACT SOLN Place 2 Pump onto the skin daily.     traMADol (ULTRAM) 50 MG tablet Take 1 tablet (50 mg total) by mouth every 6 (six) hours as needed. 30 tablet 2   No current facility-administered medications on file prior to visit.        ROS:  All others reviewed and negative.  Objective        PE:  BP 120/68 (BP Location: Right Arm, Patient Position: Sitting, Cuff Size: Normal)   Pulse 76   Temp 97.9 F (36.6 C) (Oral)   Ht 6\' 3"  (1.905 m)   Wt 201 lb (91.2 kg)   SpO2 97%   BMI 25.12 kg/m                 Constitutional: Pt appears in NAD               HENT: Head: NCAT.                Right Ear: External ear normal.                 Left Ear: External ear normal.                Eyes: . Pupils are equal, round, and reactive to light. Conjunctivae and EOM are normal               Nose: without d/c or deformity               Neck: Neck supple. Gross normal ROM               Cardiovascular: Normal rate and regular rhythm.                 Pulmonary/Chest: Effort normal and breath sounds without rales or wheezing.                Abd:  Soft, NT, ND, + BS, no organomegaly               Neurological: Pt is alert. At baseline orientation, motor grossly intact  Skin: Skin is warm. No rashes, no other new lesions, LE edema - none,  right knee with 2+ tender swelling               Psychiatric: Pt behavior is normal without agitation   Micro: none  Cardiac tracings I have personally interpreted today:  none  Pertinent Radiological findings (summarize): none   Lab Results  Component Value Date   WBC 8.4 12/19/2023   HGB 11.8 (L) 12/19/2023   HCT 35.8 (L) 12/19/2023   PLT 219.0  12/19/2023   GLUCOSE 89 12/19/2023   CHOL 196 07/17/2023   TRIG 114.0 07/17/2023   HDL 57.60 07/17/2023   LDLCALC 116 (H) 07/17/2023   ALT 10 12/19/2023   AST 17 12/19/2023   NA 135 12/19/2023   K 4.0 12/19/2023   CL 101 12/19/2023   CREATININE 1.31 12/19/2023   BUN 21 12/19/2023   CO2 26 12/19/2023   TSH 2.86 10/07/2023   PSA 3.04 07/07/2014   INR 1.2 11/07/2023   HGBA1C 7.9 (H) 10/09/2023   MICROALBUR 0.7 07/17/2023   Assessment/Plan:  Joseph Rocha is a 89 y.o. Black or African American [2] male with  has a past medical history of Allergy, Anemia, Blood transfusion without reported diagnosis, Diabetes mellitus, Elevated PSA, Hyperlipidemia, Hypertension, Hypogonadism male, OA (osteoarthritis), and Pituitary abnormality (HCC) (11/05/2002).  CKD (chronic kidney disease) stage 3, GFR 30-59 ml/min (HCC) Lab Results  Component Value Date   CREATININE 1.31 12/19/2023   Stable overall, cont to avoid nephrotoxins   Hypothyroidism Lab Results  Component Value Date   TSH 2.86 10/07/2023   Stable, pt to continue levothyroxine 50 mcg qd   Vitamin D deficiency Last vitamin D Lab Results  Component Value Date   VD25OH 78.85 07/17/2023   Stable, cont oral replacement   Adrenal insufficiency (HCC) Also for f/u lab today  Primary osteoarthritis of right knee Severe, can't r/o acute gout, for depomedrol 80 mg IM, prednisone taper, vicodin prn, and f/u sport medicine first floor,    Followup: Return in about 4 months (around 04/17/2024).  Oliver Barre, MD 12/21/2023 7:24 PM [facility] - [facility]

## 2023-12-20 ENCOUNTER — Encounter: Payer: Self-pay | Admitting: Internal Medicine

## 2023-12-20 ENCOUNTER — Other Ambulatory Visit: Payer: Self-pay | Admitting: Internal Medicine

## 2023-12-20 LAB — HEPATIC FUNCTION PANEL
ALT: 10 U/L (ref 0–53)
AST: 17 U/L (ref 0–37)
Albumin: 3.8 g/dL (ref 3.5–5.2)
Alkaline Phosphatase: 44 U/L (ref 39–117)
Bilirubin, Direct: 0 mg/dL (ref 0.0–0.3)
Total Bilirubin: 0.5 mg/dL (ref 0.2–1.2)
Total Protein: 6.6 g/dL (ref 6.0–8.3)

## 2023-12-20 LAB — MAGNESIUM: Magnesium: 1.3 mg/dL — ABNORMAL LOW (ref 1.5–2.5)

## 2023-12-20 LAB — BASIC METABOLIC PANEL
BUN: 21 mg/dL (ref 6–23)
CO2: 26 meq/L (ref 19–32)
Calcium: 8.8 mg/dL (ref 8.4–10.5)
Chloride: 101 meq/L (ref 96–112)
Creatinine, Ser: 1.31 mg/dL (ref 0.40–1.50)
GFR: 49.16 mL/min — ABNORMAL LOW (ref >60.00)
Glucose, Bld: 89 mg/dL (ref 70–99)
Potassium: 4 meq/L (ref 3.5–5.1)
Sodium: 135 meq/L (ref 135–145)

## 2023-12-20 MED ORDER — MAGNESIUM OXIDE 400 MG PO TABS: 400.0000 mg | ORAL_TABLET | Freq: Every day | ORAL | 3 refills | Status: DC

## 2023-12-21 ENCOUNTER — Encounter: Payer: Self-pay | Admitting: Internal Medicine

## 2023-12-21 DIAGNOSIS — E2740 Unspecified adrenocortical insufficiency: Secondary | ICD-10-CM | POA: Insufficient documentation

## 2023-12-21 NOTE — Assessment & Plan Note (Signed)
Severe, can't r/o acute gout, for depomedrol 80 mg IM, prednisone taper, vicodin prn, and f/u sport medicine first floor,

## 2023-12-21 NOTE — Assessment & Plan Note (Signed)
Lab Results  Component Value Date   TSH 2.86 10/07/2023   Stable, pt to continue levothyroxine 50 mcg qd

## 2023-12-21 NOTE — Assessment & Plan Note (Signed)
Lab Results  Component Value Date   CREATININE 1.31 12/19/2023   Stable overall, cont to avoid nephrotoxins

## 2023-12-21 NOTE — Assessment & Plan Note (Signed)
Last vitamin D Lab Results  Component Value Date   VD25OH 78.85 07/17/2023   Stable, cont oral replacement

## 2023-12-21 NOTE — Assessment & Plan Note (Signed)
Also for f/u lab today

## 2023-12-28 ENCOUNTER — Emergency Department (HOSPITAL_COMMUNITY): Payer: No Typology Code available for payment source

## 2023-12-28 ENCOUNTER — Emergency Department (HOSPITAL_COMMUNITY)
Admission: EM | Admit: 2023-12-28 | Discharge: 2023-12-28 | Disposition: A | Discharge status: Home / Self Care | Payer: No Typology Code available for payment source | Attending: Emergency Medicine | Admitting: Emergency Medicine

## 2023-12-28 ENCOUNTER — Encounter (HOSPITAL_COMMUNITY): Payer: Self-pay

## 2023-12-28 ENCOUNTER — Other Ambulatory Visit: Payer: Self-pay

## 2023-12-28 DIAGNOSIS — E119 Type 2 diabetes mellitus without complications: Secondary | ICD-10-CM | POA: Diagnosis not present

## 2023-12-28 DIAGNOSIS — R519 Headache, unspecified: Secondary | ICD-10-CM | POA: Diagnosis not present

## 2023-12-28 DIAGNOSIS — M542 Cervicalgia: Secondary | ICD-10-CM | POA: Diagnosis not present

## 2023-12-28 DIAGNOSIS — S161XXA Strain of muscle, fascia and tendon at neck level, initial encounter: Secondary | ICD-10-CM | POA: Diagnosis not present

## 2023-12-28 DIAGNOSIS — Z7982 Long term (current) use of aspirin: Secondary | ICD-10-CM | POA: Insufficient documentation

## 2023-12-28 DIAGNOSIS — Z7984 Long term (current) use of oral hypoglycemic drugs: Secondary | ICD-10-CM | POA: Insufficient documentation

## 2023-12-28 DIAGNOSIS — M47812 Spondylosis without myelopathy or radiculopathy, cervical region: Secondary | ICD-10-CM | POA: Diagnosis not present

## 2023-12-28 DIAGNOSIS — I6523 Occlusion and stenosis of bilateral carotid arteries: Secondary | ICD-10-CM | POA: Diagnosis not present

## 2023-12-28 DIAGNOSIS — I10 Essential (primary) hypertension: Secondary | ICD-10-CM | POA: Insufficient documentation

## 2023-12-28 DIAGNOSIS — S199XXA Unspecified injury of neck, initial encounter: Secondary | ICD-10-CM | POA: Diagnosis not present

## 2023-12-28 DIAGNOSIS — Z041 Encounter for examination and observation following transport accident: Secondary | ICD-10-CM | POA: Diagnosis not present

## 2023-12-28 DIAGNOSIS — Z79899 Other long term (current) drug therapy: Secondary | ICD-10-CM | POA: Insufficient documentation

## 2023-12-28 DIAGNOSIS — Y92410 Unspecified street and highway as the place of occurrence of the external cause: Secondary | ICD-10-CM | POA: Insufficient documentation
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
## 2023-12-31 ENCOUNTER — Encounter: Payer: Self-pay | Admitting: Internal Medicine

## 2023-12-31 ENCOUNTER — Ambulatory Visit (AMBULATORY_SURGERY_CENTER): Payer: Medicare Other | Admitting: Internal Medicine

## 2023-12-31 VITALS — BP 131/73 | HR 54 | Temp 97.2°F | Resp 14 | Ht 75.0 in | Wt 202.0 lb

## 2023-12-31 DIAGNOSIS — R112 Nausea with vomiting, unspecified: Secondary | ICD-10-CM | POA: Diagnosis not present

## 2023-12-31 DIAGNOSIS — K219 Gastro-esophageal reflux disease without esophagitis: Secondary | ICD-10-CM | POA: Diagnosis not present

## 2023-12-31 DIAGNOSIS — I10 Essential (primary) hypertension: Secondary | ICD-10-CM | POA: Diagnosis not present

## 2023-12-31 DIAGNOSIS — K449 Diaphragmatic hernia without obstruction or gangrene: Secondary | ICD-10-CM

## 2023-12-31 DIAGNOSIS — E119 Type 2 diabetes mellitus without complications: Secondary | ICD-10-CM | POA: Diagnosis not present

## 2023-12-31 DIAGNOSIS — R634 Abnormal weight loss: Secondary | ICD-10-CM | POA: Diagnosis not present

## 2023-12-31 MED ORDER — SODIUM CHLORIDE 0.9 % IV SOLN: 500.0000 mL | INTRAVENOUS | Status: DC

## 2023-12-31 NOTE — Patient Instructions (Signed)
 Handout provided on hiatal hernia.   Resume previous diet.  Continue present medications.   YOU HAD AN ENDOSCOPIC PROCEDURE TODAY AT [facility]:   Refer to the procedure report that was given to you for any specific questions about what was found during the examination.  If the procedure report does not answer your questions, please call your gastroenterologist to clarify.  If you requested that your care partner not be given the details of your procedure findings, then the procedure report has been included in a sealed envelope for you to review at your convenience later.  YOU SHOULD EXPECT: Some feelings of bloating in the abdomen. Passage of more gas than usual.  Walking can help get rid of the air that was put into your GI tract during the procedure and reduce the bloating. If you had a lower endoscopy (such as a colonoscopy or flexible sigmoidoscopy) you may notice spotting of blood in your stool or on the toilet paper. If you underwent a bowel prep for your procedure, you may not have a normal bowel movement for a few days.  Please Note:  You might notice some irritation and congestion in your nose or some drainage.  This is from the oxygen used during your procedure.  There is no need for concern and it should clear up in a day or so.  SYMPTOMS TO REPORT IMMEDIATELY:  Following upper endoscopy (EGD)  Vomiting of blood or coffee ground material  New chest pain or pain under the shoulder blades  Painful or persistently difficult swallowing  New shortness of breath  Fever of 100F or higher  Black, tarry-looking stools  For urgent or emergent issues, a gastroenterologist can be reached at any hour by calling [phone]. Do not use MyChart messaging for urgent concerns.    DIET:  We do recommend a small meal at first, but then you may proceed to your regular diet.  Drink plenty of fluids but you should avoid alcoholic beverages for 24 hours.  ACTIVITY:  You should  plan to take it easy for the rest of today and you should NOT DRIVE or use heavy machinery until tomorrow (because of the sedation medicines used during the test).    FOLLOW UP: Our staff will call the number listed on your records the next business day following your procedure.  We will call around 7:15- 8:00 am to check on you and address any questions or concerns that you may have regarding the information given to you following your procedure. If we do not reach you, we will leave a message.     If any biopsies were taken you will be contacted by phone or by letter within the next 1-3 weeks.  Please call us at [phone] if you have not heard about the biopsies in 3 weeks.    SIGNATURES/CONFIDENTIALITY: You and/or your care partner have signed paperwork which will be entered into your electronic medical record.  These signatures attest to the fact that that the information above on your After Visit Summary has been reviewed and is understood.  Full responsibility of the confidentiality of this discharge information lies with you and/or your care-partner.

## 2023-12-31 NOTE — Progress Notes (Signed)
 Expand All Collapse All HISTORY OF PRESENT ILLNESS:   Joseph Rocha is a 89 y.o. male, husband of Cherylann Ratel and previous patient of Dr. Russella Dar, who presents today for follow-up and to establish care with myself.  Patient was last seen in the office October 28, 2023 regarding weight loss, nausea and vomiting.  See that dictation.  A CT scan of the abdomen and pelvis was performed November 01, 2023.  There were no acute findings.  He was continued on omeprazole 40 mg daily for known GERD.  Also, Zofran as needed.  He was subsequently hospitalized December 31 through November 08, 2023 for syncope.  Reviewed.  At that time he was having ongoing upper GI complaints as above.  It was recommended that he follow-up with GI and may benefit from diagnostic upper endoscopy.   Blood work from November 08, 2023 showed mild anemia with hemoglobin 11.0.  Unremarkable basic metabolic panel.  He did undergo colonoscopy in 2012 which was normal except for diverticulosis and hemorrhoids.   Patient is accompanied today by his wife.  He reports that since his hospital discharge he is actually been feeling better.  He continues on PPI.  Starting to gain weight.  With occasional nausea but no vomiting.  Because of recurrent problems with symptoms, he is wondering if upper endoscopy may be helpful.  No esophageal dysphagia reported.   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 He has never used smokeless tobacco. He reports that he does not drink alcohol and does not use drugs.   family history includes Cancer in his brother; Diabetes Mellitus II in his mother; Healthy in his daughter; Heart attack in his father.   Allergies       Allergies  Allergen Reactions   Lipitor [Atorvastatin] Other (See Comments)      Myalgias, cramps in hand            PHYSICAL EXAMINATION: Vital signs: BP 104/60   Pulse (!) 112   Ht 6\' 3"  (1.905 m)   Wt 202 lb (91.6 kg)   BMI 25.25 kg/m (up 6 pounds from previous visit) Constitutional: generally well-appearing, no acute distress Psychiatric: alert and oriented x3, cooperative Eyes: extraocular movements intact, anicteric, conjunctiva pink Mouth: oral pharynx  moist, no lesions Neck: supple no lymphadenopathy Cardiovascular: heart regular rate and rhythm, no murmur Lungs: clear to auscultation bilaterally Abdomen: soft, nontender, nondistended, no obvious ascites, no peritoneal signs, normal bowel sounds, no organomegaly.  No succussion splash Rectal: Omitted Extremities: no clubbing, cyanosis, or lower extremity edema bilaterally Skin: no lesions on visible extremities Neuro: No focal deficits.  Cranial nerves intact   ASSESSMENT:   1.  Problems with recurrent nausea, vomiting and weight loss.  Stable at this time. 2.  GERD.  On PPI. 3.  Multiple medical problems 4.  Colonoscopy 2012 with diverticulosis     PLAN:   1.  Reflux precautions 2.  Continue PPI 3.  Schedule upper endoscopy to rule out any significant upper GI  mucosal pathology given recurrent problems as outlined.The nature of the procedure, as well as the risks, benefits, and alternatives were carefully and thoroughly reviewed with the patient. Ample time for discussion and questions allowed. The patient understood, was satisfied, and agreed to proceed.

## 2023-12-31 NOTE — Progress Notes (Signed)
 Vss nads trans to pacu

## 2023-12-31 NOTE — Op Note (Signed)
 [facility] Patient Name: Joseph Rocha Procedure Date: 12/31/2023 10:54 AM MRN: [id number] Endoscopist: Wilhemina Bonito. Marina Goodell , MD, [id number] Age: 89 Referring MD:  Date of Birth: Aug 27, 1935 Gender: Male Account #: [id number] Procedure:                Upper GI endoscopy Indications:              Nausea with vomiting, Weight loss. Problem has                            stabilized recently seen in the office Medicines:                Monitored Anesthesia Care Procedure:                Pre-Anesthesia Assessment:                           - Prior to the procedure, a History and Physical                            was performed, and patient medications and                            allergies were reviewed. The patient's tolerance of                            previous anesthesia was also reviewed. The risks                            and benefits of the procedure and the sedation                            options and risks were discussed with the patient.                            All questions were answered, and informed consent                            was obtained. Prior Anticoagulants: The patient has                            taken no anticoagulant or antiplatelet agents.                            After reviewing the risks and benefits, the patient                            was deemed in satisfactory condition to undergo the                            procedure.                           After obtaining informed consent, the endoscope was  passed under direct vision. Throughout the                            procedure, the patient's blood pressure, pulse, and                            oxygen saturations were monitored continuously. The                            Olympus scope [id number] was introduced through the                            mouth, and advanced to the second part of duodenum.                            The upper GI endoscopy was  accomplished without                            difficulty. The patient tolerated the procedure                            well. Scope In: Scope Out: Findings:                 The esophagus was normal.                           The stomach was normal. Moderate hiatal hernia                           The examined duodenum was normal.                           The cardia and gastric fundus were normal on                            retroflexion. Complications:            No immediate complications. Estimated Blood Loss:     Estimated blood loss: none. Impression:               - Normal esophagus.                           - Normal stomach.                           - Normal examined duodenum.                           - No specimens collected. Recommendation:           - Patient has a contact number available for                            emergencies. The signs and symptoms of potential  delayed complications were discussed with the                            patient. Return to normal activities tomorrow.                            Written discharge instructions were provided to the                            patient.                           - Resume previous diet.                           - Continue present medications. Wilhemina Bonito. Marina Goodell, MD 12/31/2023 11:10:11 AM This report has been signed electronically.
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
## 2024-01-01 ENCOUNTER — Telehealth: Payer: Self-pay

## 2024-01-01 NOTE — Telephone Encounter (Signed)
  Follow up Call-     12/31/2023   10:22 AM  Call back number  Post procedure Call Back phone  # [phone]  Permission to leave phone message Yes     Patient questions:  Do you have a fever, pain , or abdominal swelling? No. Pain Score  0 *  Have you tolerated food without any problems? Yes.    Have you been able to return to your normal activities? Yes.    Do you have any questions about your discharge instructions: Diet   No. Medications  No. Follow up visit  No.  Do you have questions or concerns about your Care? No.  Actions: * If pain score is 4 or above: No action needed, pain <4.

## 2024-01-03 DIAGNOSIS — Z9889 Other specified postprocedural states: Secondary | ICD-10-CM | POA: Diagnosis not present

## 2024-01-03 DIAGNOSIS — E1165 Type 2 diabetes mellitus with hyperglycemia: Secondary | ICD-10-CM | POA: Diagnosis not present

## 2024-01-03 DIAGNOSIS — R001 Bradycardia, unspecified: Secondary | ICD-10-CM | POA: Diagnosis not present

## 2024-01-03 DIAGNOSIS — E2749 Other adrenocortical insufficiency: Secondary | ICD-10-CM | POA: Diagnosis not present

## 2024-01-03 DIAGNOSIS — I10 Essential (primary) hypertension: Secondary | ICD-10-CM | POA: Diagnosis not present

## 2024-01-03 DIAGNOSIS — N183 Chronic kidney disease, stage 3 unspecified: Secondary | ICD-10-CM | POA: Diagnosis not present

## 2024-01-03 DIAGNOSIS — E559 Vitamin D deficiency, unspecified: Secondary | ICD-10-CM | POA: Diagnosis not present

## 2024-01-03 DIAGNOSIS — E291 Testicular hypofunction: Secondary | ICD-10-CM | POA: Diagnosis not present

## 2024-01-09 ENCOUNTER — Other Ambulatory Visit: Payer: Self-pay | Admitting: Internal Medicine

## 2024-01-09 DIAGNOSIS — R58 Hemorrhage, not elsewhere classified: Secondary | ICD-10-CM | POA: Diagnosis not present

## 2024-01-09 DIAGNOSIS — R55 Syncope and collapse: Secondary | ICD-10-CM | POA: Diagnosis not present

## 2024-01-09 DIAGNOSIS — M542 Cervicalgia: Secondary | ICD-10-CM | POA: Diagnosis not present

## 2024-01-09 DIAGNOSIS — M549 Dorsalgia, unspecified: Secondary | ICD-10-CM | POA: Diagnosis not present

## 2024-01-09 MED ORDER — MAGNESIUM 200 MG PO TABS: ORAL_TABLET | ORAL | 3 refills | Status: AC

## 2024-01-09 NOTE — Telephone Encounter (Signed)
 Ok I tried to change to magnseium 200 mg tab - 2 per day  I dont see where any tablet or capsule for magnesium in EPIC is listed as covered  If this is not covered, please ask pt to inquire with insurance about which oral magnesium supplement would be covered   thanks

## 2024-01-13 ENCOUNTER — Ambulatory Visit: Payer: Medicare Other

## 2024-01-16 ENCOUNTER — Ambulatory Visit: Payer: Medicare Other | Admitting: Internal Medicine

## 2024-01-20 ENCOUNTER — Other Ambulatory Visit: Payer: Self-pay | Admitting: Internal Medicine

## 2024-01-20 NOTE — Telephone Encounter (Unsigned)
 Copied from CRM [id number]. Topic: Clinical - Medication Refill >> Jan 20, 2024  3:03 PM Alcus Dad wrote: Most Recent Primary Care Visit:  Provider: [name]  Department: [facility]  Visit Type: ACUTE  Date: 12/19/2023  Medication: omeprazole (PRILOSEC) 40 MG capsule  Has the patient contacted their pharmacy? Yes (Agent: If no, request that the patient contact the pharmacy for the refill. If patient does not wish to contact the pharmacy document the reason why and proceed with request.) (Agent: If yes, when and what did the pharmacy advise?)  Is this the correct pharmacy for this prescription? Yes If no, delete pharmacy and type the correct one.  This is the patient's preferred pharmacy:  [facility] - [place], [place] - [address] [facility] AT Portal to Registered Caremark Sites [address] [place] [address] Phone: [phone] Opt 2 /Ref #: [id number] Fax: [phone]  [id number] [place], [place] - [address] AT [facility] OF [facility] [address] [place] [address] Phone: [phone] Fax: [phone]   Has the prescription been filled recently? No  Is the patient out of the medication? No  Has the patient been seen for an appointment in the last year OR does the patient have an upcoming appointment? Yes  Can we respond through MyChart? No  Agent: Please be advised that Rx refills may take up to 3 business days. We ask that you follow-up with your pharmacy.

## 2024-01-21 ENCOUNTER — Ambulatory Visit: Payer: Medicare Other | Admitting: Internal Medicine

## 2024-01-21 ENCOUNTER — Ambulatory Visit: Payer: Medicare Other

## 2024-01-22 ENCOUNTER — Ambulatory Visit: Payer: Medicare Other | Admitting: Internal Medicine

## 2024-01-22 DIAGNOSIS — R609 Edema, unspecified: Secondary | ICD-10-CM | POA: Diagnosis not present

## 2024-01-22 DIAGNOSIS — R000 Tachycardia, unspecified: Secondary | ICD-10-CM | POA: Diagnosis not present

## 2024-01-22 DIAGNOSIS — R220 Localized swelling, mass and lump, head: Secondary | ICD-10-CM | POA: Diagnosis not present

## 2024-01-22 DIAGNOSIS — T7840XA Allergy, unspecified, initial encounter: Secondary | ICD-10-CM | POA: Diagnosis not present

## 2024-01-22 DIAGNOSIS — I4891 Unspecified atrial fibrillation: Secondary | ICD-10-CM | POA: Diagnosis not present

## 2024-01-23 ENCOUNTER — Ambulatory Visit (INDEPENDENT_AMBULATORY_CARE_PROVIDER_SITE_OTHER): Admitting: Internal Medicine

## 2024-01-23 ENCOUNTER — Encounter: Payer: Self-pay | Admitting: Internal Medicine

## 2024-01-23 DIAGNOSIS — R1084 Generalized abdominal pain: Secondary | ICD-10-CM | POA: Diagnosis not present

## 2024-01-23 DIAGNOSIS — E039 Hypothyroidism, unspecified: Secondary | ICD-10-CM | POA: Diagnosis not present

## 2024-01-23 DIAGNOSIS — I499 Cardiac arrhythmia, unspecified: Secondary | ICD-10-CM | POA: Diagnosis not present

## 2024-01-23 DIAGNOSIS — E559 Vitamin D deficiency, unspecified: Secondary | ICD-10-CM

## 2024-01-23 DIAGNOSIS — E785 Hyperlipidemia, unspecified: Secondary | ICD-10-CM | POA: Diagnosis not present

## 2024-01-23 DIAGNOSIS — R001 Bradycardia, unspecified: Secondary | ICD-10-CM | POA: Insufficient documentation

## 2024-01-23 DIAGNOSIS — E1169 Type 2 diabetes mellitus with other specified complication: Secondary | ICD-10-CM

## 2024-01-23 DIAGNOSIS — N1831 Chronic kidney disease, stage 3a: Secondary | ICD-10-CM | POA: Diagnosis not present

## 2024-01-23 DIAGNOSIS — M549 Dorsalgia, unspecified: Secondary | ICD-10-CM | POA: Diagnosis not present

## 2024-01-23 DIAGNOSIS — R531 Weakness: Secondary | ICD-10-CM | POA: Diagnosis not present

## 2024-01-23 DIAGNOSIS — E1165 Type 2 diabetes mellitus with hyperglycemia: Secondary | ICD-10-CM | POA: Insufficient documentation

## 2024-01-23 DIAGNOSIS — Z7984 Long term (current) use of oral hypoglycemic drugs: Secondary | ICD-10-CM

## 2024-01-23 DIAGNOSIS — M159 Polyosteoarthritis, unspecified: Secondary | ICD-10-CM | POA: Insufficient documentation

## 2024-01-23 DIAGNOSIS — I10 Essential (primary) hypertension: Secondary | ICD-10-CM | POA: Diagnosis not present

## 2024-01-23 DIAGNOSIS — N401 Enlarged prostate with lower urinary tract symptoms: Secondary | ICD-10-CM | POA: Insufficient documentation

## 2024-01-23 DIAGNOSIS — Z9889 Other specified postprocedural states: Secondary | ICD-10-CM | POA: Insufficient documentation

## 2024-01-23 DIAGNOSIS — R140 Abdominal distension (gaseous): Secondary | ICD-10-CM | POA: Diagnosis not present

## 2024-01-23 DIAGNOSIS — E2749 Other adrenocortical insufficiency: Secondary | ICD-10-CM | POA: Insufficient documentation

## 2024-01-23 DIAGNOSIS — E119 Type 2 diabetes mellitus without complications: Secondary | ICD-10-CM

## 2024-01-23 DIAGNOSIS — R000 Tachycardia, unspecified: Secondary | ICD-10-CM | POA: Diagnosis not present

## 2024-01-23 MED ORDER — DICYCLOMINE HCL 10 MG PO CAPS: 10.0000 mg | ORAL_CAPSULE | Freq: Three times a day (TID) | ORAL | 1 refills | Status: DC | PRN

## 2024-01-23 NOTE — Patient Instructions (Addendum)
 Ok to take the magnesium 200 mg - 2 per day  Please take all new medication as prescribed  - the dicyclomine as needed for the stomach  Please continue all other medications as before, and refills have been done if requested.  Please have the pharmacy call with any other refills you may need.  Please keep your appointments with your specialists as you may have planned  We can hold on further lab work today  Please make an Appointment to return in June 12 as planned, or sooner if needed, or later if you prefer
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
## 2024-01-24 ENCOUNTER — Ambulatory Visit

## 2024-01-24 VITALS — Ht 75.0 in | Wt 196.0 lb

## 2024-01-24 DIAGNOSIS — R069 Unspecified abnormalities of breathing: Secondary | ICD-10-CM | POA: Diagnosis not present

## 2024-01-24 DIAGNOSIS — Z0000 Encounter for general adult medical examination without abnormal findings: Secondary | ICD-10-CM | POA: Diagnosis not present

## 2024-01-24 DIAGNOSIS — R609 Edema, unspecified: Secondary | ICD-10-CM | POA: Diagnosis not present

## 2024-01-24 DIAGNOSIS — I10 Essential (primary) hypertension: Secondary | ICD-10-CM | POA: Diagnosis not present

## 2024-01-24 DIAGNOSIS — Z743 Need for continuous supervision: Secondary | ICD-10-CM | POA: Diagnosis not present

## 2024-01-24 DIAGNOSIS — W19XXXA Unspecified fall, initial encounter: Secondary | ICD-10-CM | POA: Diagnosis not present
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
## 2024-01-24 NOTE — Patient Instructions (Addendum)
 [name] , Thank you for taking time to come for your Medicare Wellness Visit. I appreciate your ongoing commitment to your health goals. Please review the following plan we discussed and let me know if I can assist you in the future.   Referrals/Orders/Follow-Ups/Clinician Recommendations: Aim for 30 minutes of exercise or brisk walking, 6-8 glasses of water, and 5 servings of fruits and vegetables each day.   Managing Pain Without Opioids Opioids are strong medicines used to treat moderate to severe pain. For some people, especially those who have long-term (chronic) pain, opioids may not be the best choice for pain management due to: Side effects like nausea, constipation, and sleepiness. The risk of addiction (opioid use disorder). The longer you take opioids, the greater your risk of addiction. Pain that lasts for more than 3 months is called chronic pain. Managing chronic pain usually requires more than one approach and is often provided by a team of health care providers working together (multidisciplinary approach). Pain management may be done at a pain management center or pain clinic. How to manage pain without the use of opioids Use non-opioid medicines Non-opioid medicines for pain may include: Over-the-counter or prescription non-steroidal anti-inflammatory drugs (NSAIDs). These may be the first medicines used for pain. They work well for muscle and bone pain, and they reduce swelling. Acetaminophen. This over-the-counter medicine may work well for milder pain but not swelling. Antidepressants. These may be used to treat chronic pain. A certain type of antidepressant (tricyclics) is often used. These medicines are given in lower doses for pain than when used for depression. Anticonvulsants. These are usually used to treat seizures but may also reduce nerve (neuropathic) pain. Muscle relaxants. These relieve pain caused by sudden muscle tightening (spasms). You may also use a pain  medicine that is applied to the skin as a patch, cream, or gel (topical analgesic), such as a numbing medicine. These may cause fewer side effects than medicines taken by mouth. Do certain therapies as directed Some therapies can help with pain management. They include: Physical therapy. You will do exercises to gain strength and flexibility. A physical therapist may teach you exercises to move and stretch parts of your body that are weak, stiff, or painful. You can learn these exercises at physical therapy visits and practice them at home. Physical therapy may also involve: Massage. Heat wraps or applying heat or cold to affected areas. Electrical signals that interrupt pain signals (transcutaneous electrical nerve stimulation, TENS). Weak lasers that reduce pain and swelling (low-level laser therapy). Signals from your body that help you learn to regulate pain (biofeedback). Occupational therapy. This helps you to learn ways to function at home and work with less pain. Recreational therapy. This involves trying new activities or hobbies, such as a physical activity or drawing. Mental health therapy, including: Cognitive behavioral therapy (CBT). This helps you learn coping skills for dealing with pain. Acceptance and commitment therapy (ACT) to change the way you think and react to pain. Relaxation therapies, including muscle relaxation exercises and mindfulness-based stress reduction. Pain management counseling. This may be individual, family, or group counseling.  Receive medical treatments Medical treatments for pain management include: Nerve block injections. These may include a pain blocker and anti-inflammatory medicines. You may have injections: Near the spine to relieve chronic back or neck pain. Into joints to relieve back or joint pain. Into nerve areas that supply a painful area to relieve body pain. Into muscles (trigger point injections) to relieve some painful muscle  conditions. A medical device placed near your spine to help block pain signals and relieve nerve pain or chronic back pain (spinal cord stimulation device). Acupuncture. Follow these instructions at home Medicines Take over-the-counter and prescription medicines only as told by your health care provider. If you are taking pain medicine, ask your health care providers about possible side effects to watch out for. Do not drive or use heavy machinery while taking prescription opioid pain medicine. Lifestyle  Do not use drugs or alcohol to reduce pain. If you drink alcohol, limit how much you have to: 0-1 drink a day for women who are not pregnant. 0-2 drinks a day for men. Know how much alcohol is in a drink. In the [place] one drink equals one 12 oz bottle of beer (355 mL), one 5 oz glass of wine (148 mL), or one 1 oz glass of hard liquor (44 mL). Do not use any products that contain nicotine or tobacco. These products include cigarettes, chewing tobacco, and vaping devices, such as e-cigarettes. If you need help quitting, ask your health care provider. Eat a healthy diet and maintain a healthy weight. Poor diet and excess weight may make pain worse. Eat foods that are high in fiber. These include fresh fruits and vegetables, whole grains, and beans. Limit foods that are high in fat and processed sugars, such as fried and sweet foods. Exercise regularly. Exercise lowers stress and may help relieve pain. Ask your health care provider what activities and exercises are safe for you. If your health care provider approves, join an exercise class that combines movement and stress reduction. Examples include yoga and tai chi. Get enough sleep. Lack of sleep may make pain worse. Lower stress as much as possible. Practice stress reduction techniques as told by your therapist. General instructions Work with all your pain management providers to find the treatments that work best for you. You are an  important member of your pain management team. There are many things you can do to reduce pain on your own. Consider joining an online or in-person support group for people who have chronic pain. Keep all follow-up visits. This is important. Where to find more information You can find more information about managing pain without opioids from: American Academy of Pain Medicine: painmed.org Institute for Chronic Pain: instituteforchronicpain.org American Chronic Pain Association: theacpa.org Contact a health care provider if: You have side effects from pain medicine. Your pain gets worse or does not get better with treatments or home therapy. You are struggling with anxiety or depression. Summary Many types of pain can be managed without opioids. Chronic pain may respond better to pain management without opioids. Pain is best managed when you and a team of health care providers work together. Pain management without opioids may include non-opioid medicines, medical treatments, physical therapy, mental health therapy, and lifestyle changes. Tell your health care providers if your pain gets worse or is not being managed well enough. This information is not intended to replace advice given to you by your health care provider. Make sure you discuss any questions you have with your health care provider. Document Revised: 02/01/2021 Document Reviewed: 02/01/2021 Elsevier Patient Education  2024 [employer].  This is a list of the screening recommended for you and due dates:  Health Maintenance  Topic Date Due   Zoster (Shingles) Vaccine (1 of 2) Never done   Eye exam for diabetics  01/01/2024   Hemoglobin A1C  04/08/2024   DTaP/Tdap/Td vaccine (2 - Td  or Tdap) 07/07/2024   Complete foot exam   01/22/2025   Medicare Annual Wellness Visit  01/23/2025   Pneumonia Vaccine  Completed   Flu Shot  Completed   HPV Vaccine  Aged Out   COVID-19 Vaccine  Discontinued    Advanced directives:  (Provided) Advance directive discussed with you today. I have provided a copy for you to complete at home and have notarized. Once this is complete, please bring a copy in to our office so we can scan it into your chart.   Next Medicare Annual Wellness Visit scheduled for next year: Yes

## 2024-01-25 ENCOUNTER — Encounter: Payer: Self-pay | Admitting: Internal Medicine

## 2024-01-25 NOTE — Assessment & Plan Note (Signed)
 Lab Results  Component Value Date   LDLCALC 116 (H) 07/17/2023   uncontrolled, pt to continue current statin crestor 5 mg every day, declines change for now, for lower chol diet

## 2024-01-25 NOTE — Assessment & Plan Note (Signed)
 Lab Results  Component Value Date   TSH 2.86 10/07/2023   Stable, pt to continue levothyroxine 50 mcg qd

## 2024-01-25 NOTE — Assessment & Plan Note (Signed)
 Lab Results  Component Value Date   CREATININE 1.31 12/19/2023   Stable overall, cont to avoid nephrotoxins

## 2024-01-25 NOTE — Assessment & Plan Note (Signed)
Last vitamin D Lab Results  Component Value Date   VD25OH 78.85 07/17/2023   Stable, cont oral replacement

## 2024-01-25 NOTE — Assessment & Plan Note (Signed)
 C/w IBS like pains, for bentyl trial,  to f/u any worsening symptoms or concerns

## 2024-01-25 NOTE — Assessment & Plan Note (Signed)
 Pt encouraged to start magnesium suppelement 400 qd

## 2024-01-25 NOTE — Assessment & Plan Note (Signed)
 Lab Results  Component Value Date   HGBA1C 7.9 (H) 10/09/2023   Uncontrolled, pt to continue current medical treatment metformin 500 mg bid, prandin 1 mg tid

## 2024-01-27 DIAGNOSIS — I10 Essential (primary) hypertension: Secondary | ICD-10-CM | POA: Diagnosis not present

## 2024-01-27 DIAGNOSIS — R4182 Altered mental status, unspecified: Secondary | ICD-10-CM | POA: Diagnosis not present

## 2024-01-27 DIAGNOSIS — R1111 Vomiting without nausea: Secondary | ICD-10-CM | POA: Diagnosis not present

## 2024-01-27 DIAGNOSIS — R61 Generalized hyperhidrosis: Secondary | ICD-10-CM | POA: Diagnosis not present

## 2024-01-28 DIAGNOSIS — I10 Essential (primary) hypertension: Secondary | ICD-10-CM | POA: Diagnosis not present

## 2024-01-28 DIAGNOSIS — Z7401 Bed confinement status: Secondary | ICD-10-CM | POA: Diagnosis not present

## 2024-01-28 DIAGNOSIS — M549 Dorsalgia, unspecified: Secondary | ICD-10-CM | POA: Diagnosis not present

## 2024-01-28 DIAGNOSIS — M542 Cervicalgia: Secondary | ICD-10-CM | POA: Diagnosis not present

## 2024-01-29 DIAGNOSIS — W19XXXA Unspecified fall, initial encounter: Secondary | ICD-10-CM | POA: Diagnosis not present

## 2024-01-29 DIAGNOSIS — I959 Hypotension, unspecified: Secondary | ICD-10-CM | POA: Diagnosis not present

## 2024-01-29 DIAGNOSIS — Z743 Need for continuous supervision: Secondary | ICD-10-CM | POA: Diagnosis not present

## 2024-01-31 NOTE — Telephone Encounter (Unsigned)
 Copied from CRM [id number]. Topic: General - Call Back - No Documentation >> Jan 31, 2024  3:30 PM Florestine Avers wrote: Reason for CRM: Patient called in requesting to speak to Dr. Melvyn Novas nurse.

## 2024-02-01 DIAGNOSIS — I10 Essential (primary) hypertension: Secondary | ICD-10-CM | POA: Diagnosis not present

## 2024-02-01 DIAGNOSIS — K9423 Gastrostomy malfunction: Secondary | ICD-10-CM | POA: Diagnosis not present

## 2024-02-03 ENCOUNTER — Other Ambulatory Visit: Payer: Self-pay

## 2024-02-03 MED ORDER — OMEPRAZOLE 40 MG PO CPDR: 40.0000 mg | DELAYED_RELEASE_CAPSULE | Freq: Every day | ORAL | 2 refills | Status: DC

## 2024-02-04 ENCOUNTER — Other Ambulatory Visit: Payer: Self-pay | Admitting: Internal Medicine

## 2024-02-04 DIAGNOSIS — E119 Type 2 diabetes mellitus without complications: Secondary | ICD-10-CM

## 2024-02-04 NOTE — Telephone Encounter (Unsigned)
 Copied from CRM [id number]. Topic: Clinical - Medication Refill >> Feb 04, 2024 10:51 AM Alcus Dad wrote: Most Recent Primary Care Visit:  Provider: Darreld Mclean  Department: [facility]  Visit Type: ANNUAL WELL VISIT, SEQUENTIAL  Date: 01/24/2024  Medication: levothyroxine (SYNTHROID) 50 MCG tablet  Has the patient contacted their pharmacy? Yes (Agent: If no, request that the patient contact the pharmacy for the refill. If patient does not wish to contact the pharmacy document the reason why and proceed with request.) (Agent: If yes, when and what did the pharmacy advise?)  Is this the correct pharmacy for this prescription? Yes If no, delete pharmacy and type the correct one.  This is the patient's preferred pharmacy:  [facility] - [place], [place] - [address] Portal to Registered [facility] Sites [address] [place] [address] Phone: [phone] Fax: [phone]   Has the prescription been filled recently? No  Is the patient out of the medication? No  Has the patient been seen for an appointment in the last year OR does the patient have an upcoming appointment? Yes  Can we respond through MyChart? No  Agent: Please be advised that Rx refills may take up to 3 business days. We ask that you follow-up with your pharmacy.

## 2024-02-06 DIAGNOSIS — I10 Essential (primary) hypertension: Secondary | ICD-10-CM | POA: Diagnosis not present

## 2024-02-06 DIAGNOSIS — R066 Hiccough: Secondary | ICD-10-CM | POA: Diagnosis not present

## 2024-02-06 DIAGNOSIS — H6123 Impacted cerumen, bilateral: Secondary | ICD-10-CM | POA: Diagnosis not present

## 2024-02-08 DIAGNOSIS — I10 Essential (primary) hypertension: Secondary | ICD-10-CM | POA: Diagnosis not present

## 2024-02-08 DIAGNOSIS — R531 Weakness: Secondary | ICD-10-CM | POA: Diagnosis not present

## 2024-02-09 DIAGNOSIS — R739 Hyperglycemia, unspecified: Secondary | ICD-10-CM | POA: Diagnosis not present

## 2024-02-09 DIAGNOSIS — R0689 Other abnormalities of breathing: Secondary | ICD-10-CM | POA: Diagnosis not present

## 2024-02-09 DIAGNOSIS — R110 Nausea: Secondary | ICD-10-CM | POA: Diagnosis not present

## 2024-02-09 DIAGNOSIS — K5900 Constipation, unspecified: Secondary | ICD-10-CM | POA: Diagnosis not present

## 2024-02-13 DIAGNOSIS — E860 Dehydration: Secondary | ICD-10-CM | POA: Diagnosis not present

## 2024-02-13 DIAGNOSIS — R0902 Hypoxemia: Secondary | ICD-10-CM | POA: Diagnosis not present

## 2024-02-14 DIAGNOSIS — R1084 Generalized abdominal pain: Secondary | ICD-10-CM | POA: Diagnosis not present

## 2024-02-14 DIAGNOSIS — R58 Hemorrhage, not elsewhere classified: Secondary | ICD-10-CM | POA: Diagnosis not present

## 2024-02-19 ENCOUNTER — Ambulatory Visit: Payer: Self-pay

## 2024-02-19 NOTE — Telephone Encounter (Signed)
 Chief Complaint: Back pain Symptoms: Back pain down left leg Frequency: a few days Pertinent Negatives: Patient denies fever, numbness/weakness of legs, pain with urination Disposition: [] ED /[] Urgent Care (no appt availability in office) / [x] Appointment(In office/virtual)/ []  [facility]/ [] Home Care/ [] Refused Recommended Disposition /[] [facility]/ []  Follow-up with PCP Additional Notes: Patient called in stating he has been experiencing back pain for a few days. Patient states he was getting out of the shower at his daughter's house and noticed a weird feeling and woke up the next morning with "pain as bad as a tooth ache". Patient states the pain does go down his left leg mildly. Patient denies pain with urination, numbness/weakness of legs. Patient appt made with PCP.   Copied from CRM [id number]. Topic: Clinical - Red Word Triage >> Feb 19, 2024  1:48 PM [place] A wrote: [facility] that prompted transfer to Nurse Triage: Patient called because he has pain in legs for past few days 1-10;10 Reason for Disposition  [1] Pain radiates into the thigh or further down the leg AND [2] one leg  Answer Assessment - Initial Assessment Questions 1. ONSET: "When did the pain begin?"      "A few days" 2. LOCATION: "Where does it hurt?" (upper, mid or lower back)     Lower left side but going across whole back 3. SEVERITY: "How bad is the pain?"  (e.g., Scale 1-10; mild, moderate, or severe)   - MILD (1-3): Doesn't interfere with normal activities.    - MODERATE (4-7): Interferes with normal activities or awakens from sleep.    - SEVERE (8-10): Excruciating pain, unable to do any normal activities.      9 4. PATTERN: "Is the pain constant?" (e.g., yes, no; constant, intermittent)      Constant 5. RADIATION: "Does the pain shoot into your legs or somewhere else?"     Going down left leg 6. CAUSE:  "What do you think is causing the back pain?"      uncertain 7. BACK OVERUSE:   "Any recent lifting of heavy objects, strenuous work or exercise?"     No 8. MEDICINES: "What have you taken so far for the pain?" (e.g., nothing, acetaminophen, NSAIDS)     Tylenol 9. NEUROLOGIC SYMPTOMS: "Do you have any weakness, numbness, or problems with bowel/bladder control?"     "Kind of but not necessarily new" 10. OTHER SYMPTOMS: "Do you have any other symptoms?" (e.g., fever, abdomen pain, burning with urination, blood in urine)       No  Protocols used: Back Pain-A-AH

## 2024-02-20 ENCOUNTER — Ambulatory Visit (INDEPENDENT_AMBULATORY_CARE_PROVIDER_SITE_OTHER): Admitting: Internal Medicine

## 2024-02-20 ENCOUNTER — Other Ambulatory Visit: Payer: Self-pay | Admitting: Internal Medicine

## 2024-02-20 ENCOUNTER — Encounter: Payer: Self-pay | Admitting: Internal Medicine

## 2024-02-20 ENCOUNTER — Ambulatory Visit (INDEPENDENT_AMBULATORY_CARE_PROVIDER_SITE_OTHER)

## 2024-02-20 VITALS — BP 126/70 | HR 75 | Temp 97.6°F | Ht 75.0 in | Wt 190.0 lb

## 2024-02-20 DIAGNOSIS — M5416 Radiculopathy, lumbar region: Secondary | ICD-10-CM | POA: Diagnosis not present

## 2024-02-20 DIAGNOSIS — Z7984 Long term (current) use of oral hypoglycemic drugs: Secondary | ICD-10-CM

## 2024-02-20 DIAGNOSIS — W19XXXA Unspecified fall, initial encounter: Secondary | ICD-10-CM

## 2024-02-20 DIAGNOSIS — E1165 Type 2 diabetes mellitus with hyperglycemia: Secondary | ICD-10-CM | POA: Diagnosis not present

## 2024-02-20 DIAGNOSIS — E559 Vitamin D deficiency, unspecified: Secondary | ICD-10-CM

## 2024-02-20 DIAGNOSIS — M25551 Pain in right hip: Secondary | ICD-10-CM | POA: Diagnosis not present

## 2024-02-20 DIAGNOSIS — M47816 Spondylosis without myelopathy or radiculopathy, lumbar region: Secondary | ICD-10-CM | POA: Diagnosis not present

## 2024-02-20 DIAGNOSIS — M545 Low back pain, unspecified: Secondary | ICD-10-CM | POA: Diagnosis not present

## 2024-02-20 DIAGNOSIS — M25552 Pain in left hip: Secondary | ICD-10-CM | POA: Diagnosis not present

## 2024-02-20 MED ORDER — METHYLPREDNISOLONE ACETATE 80 MG/ML IJ SUSP
80.0000 mg | Freq: Once | INTRAMUSCULAR | Status: AC
  Administered 2024-02-20: 80 mg via INTRAMUSCULAR

## 2024-02-20 MED ORDER — HYDROCODONE-ACETAMINOPHEN 5-325 MG PO TABS: 1.0000 | ORAL_TABLET | Freq: Four times a day (QID) | ORAL | 0 refills | Status: DC | PRN

## 2024-02-20 MED ORDER — PREDNISONE 10 MG PO TABS: ORAL_TABLET | ORAL | 0 refills | Status: DC

## 2024-02-20 MED ORDER — GABAPENTIN 100 MG PO CAPS: 100.0000 mg | ORAL_CAPSULE | Freq: Three times a day (TID) | ORAL | 5 refills | Status: DC

## 2024-02-20 NOTE — Patient Instructions (Addendum)
 Please take all new medication as prescribed   - the hydrocodone as needed (and call for refills if needed)  Please take all new medication as prescribed - the prednisone course,   Please take all new medication as prescribed - gabapentin 100 mg three times per day for nerve pain (and call in 1 wk if you need a higher dose)  Please continue all other medications as before, and refills have been done if requested.  Please have the pharmacy call with any other refills you may need.  Please keep your appointments with your specialists as you may have planned  You will be contacted regarding the referral for: MRI and orthopedic  Please go to the XRAY Department in the first floor for the x-ray testing  You will be contacted by phone if any changes need to be made immediately.  Otherwise, you will receive a letter about your results with an explanation, but please check with MyChart first.
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
## 2024-02-21 ENCOUNTER — Encounter: Payer: Self-pay | Admitting: Internal Medicine

## 2024-02-21 DIAGNOSIS — M5416 Radiculopathy, lumbar region: Secondary | ICD-10-CM | POA: Insufficient documentation

## 2024-02-21 DIAGNOSIS — W19XXXA Unspecified fall, initial encounter: Secondary | ICD-10-CM | POA: Insufficient documentation

## 2024-02-21 NOTE — Assessment & Plan Note (Signed)
Last vitamin D Lab Results  Component Value Date   VD25OH 78.85 07/17/2023   Stable, cont oral replacement

## 2024-02-21 NOTE — Assessment & Plan Note (Signed)
 Lab Results  Component Value Date   HGBA1C 7.9 (H) 10/09/2023   Mild uncontrolled, pt to continue current medical treatment metformin  500 mg bid, prandin  2 mg bid, declines change today

## 2024-02-21 NOTE — Assessment & Plan Note (Signed)
 New onset , severe pain, for MRI LS spine, depomedrol 80 mg IM, prednsione taper, hydrocodone  5 325 prn, gabapentin  100 mg tid, refer ortho

## 2024-02-21 NOTE — Assessment & Plan Note (Signed)
 Also for plain film xray pelvis

## 2024-02-22 ENCOUNTER — Encounter: Payer: Self-pay | Admitting: Internal Medicine
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
## 2024-02-26 ENCOUNTER — Ambulatory Visit: Admitting: Gastroenterology

## 2024-03-02 ENCOUNTER — Telehealth: Payer: Self-pay | Admitting: Internal Medicine

## 2024-03-02 DIAGNOSIS — E119 Type 2 diabetes mellitus without complications: Secondary | ICD-10-CM

## 2024-03-02 NOTE — Telephone Encounter (Signed)
 Copied from CRM [id number]. Topic: Clinical - Medication Refill >> Mar 02, 2024 10:19 AM Star East wrote: Most Recent Primary Care Visit:  Provider: Roslyn Coombe  Department: [facility] [place]  Visit Type: ACUTE  Date: 02/20/2024  Medication: levothyroxine  (SYNTHROID ) 50 MCG tablet  Has the patient contacted their pharmacy? Yes (Agent: If no, request that the patient contact the pharmacy for the refill. If patient does not wish to contact the pharmacy document the reason why and proceed with request.) (Agent: If yes, when and what did the pharmacy advise?)  Is this the correct pharmacy for this prescription? Yes If no, delete pharmacy and type the correct one.  This is the patient's preferred pharmacy:   [id number] [place], [place] - [phone] AT [facility] OF [facility] & [address] [place] [address] Phone: [phone] Fax: [phone]    Has the prescription been filled recently? Yes  Is the patient out of the medication? Yes  Has the patient been seen for an appointment in the last year OR does the patient have an upcoming appointment? Yes  Can we respond through MyChart? No  Agent: Please be advised that Rx refills may take up to 3 business days. We ask that you follow-up with your pharmacy.

## 2024-03-02 NOTE — Telephone Encounter (Signed)
 Copied from CRM [id number]. Topic: Clinical - Medication Refill >> Mar 02, 2024 10:19 AM Star East wrote: Most Recent Primary Care Visit:  Provider: Roslyn Coombe  Department: [facility] [place]  Visit Type: ACUTE  Date: 02/20/2024  Medication: levothyroxine  (SYNTHROID ) 50 MCG tablet  Has the patient contacted their pharmacy? Yes (Agent: If no, request that the patient contact the pharmacy for the refill. If patient does not wish to contact the pharmacy document the reason why and proceed with request.) (Agent: If yes, when and what did the pharmacy advise?)  Is this the correct pharmacy for this prescription? Yes If no, delete pharmacy and type the correct one.  This is the patient's preferred pharmacy:   [id number] [place], [place] - [phone] AT [facility] OF [facility] & [address] [place] [address] Phone: [phone] Fax: [phone]    Has the prescription been filled recently? Yes  Is the patient out of the medication? Yes  Has the patient been seen for an appointment in the last year OR does the patient have an upcoming appointment? Yes  Can we respond through MyChart? No  Agent: Please be advised that Rx refills may take up to 3 business days. We ask that you follow-up with your pharmacy. >> Mar 02, 2024  1:20 PM Emylou G wrote: Pls call patient.. [phone].. been waiting on levothyroxine  (SYNTHROID ) 50 MCG tablet. He is going out of town?

## 2024-03-03 ENCOUNTER — Other Ambulatory Visit: Payer: Self-pay

## 2024-03-03 ENCOUNTER — Other Ambulatory Visit

## 2024-03-03 MED ORDER — LEVOTHYROXINE SODIUM 50 MCG PO TABS: 50.0000 ug | ORAL_TABLET | Freq: Every day | ORAL | 2 refills | Status: AC

## 2024-03-03 NOTE — Telephone Encounter (Signed)
 Refill has been sent.

## 2024-03-03 NOTE — Telephone Encounter (Signed)
 Duplicated, requested yesterday

## 2024-03-08 ENCOUNTER — Ambulatory Visit
Admission: RE | Admit: 2024-03-08 | Discharge: 2024-03-08 | Disposition: A | Discharge status: Home / Self Care | Source: Ambulatory Visit | Attending: Internal Medicine | Admitting: Internal Medicine

## 2024-03-08 DIAGNOSIS — M48061 Spinal stenosis, lumbar region without neurogenic claudication: Secondary | ICD-10-CM | POA: Diagnosis not present

## 2024-03-08 DIAGNOSIS — M5416 Radiculopathy, lumbar region: Secondary | ICD-10-CM

## 2024-03-08 DIAGNOSIS — M47816 Spondylosis without myelopathy or radiculopathy, lumbar region: Secondary | ICD-10-CM | POA: Diagnosis not present

## 2024-03-08 DIAGNOSIS — M4316 Spondylolisthesis, lumbar region: Secondary | ICD-10-CM | POA: Diagnosis not present

## 2024-03-08 DIAGNOSIS — M5126 Other intervertebral disc displacement, lumbar region: Secondary | ICD-10-CM | POA: Diagnosis not present

## 2024-03-10 ENCOUNTER — Other Ambulatory Visit

## 2024-03-16 ENCOUNTER — Encounter: Payer: Self-pay | Admitting: Internal Medicine

## 2024-03-17 ENCOUNTER — Ambulatory Visit: Admitting: Family Medicine

## 2024-03-18 ENCOUNTER — Ambulatory Visit: Admitting: Physical Medicine and Rehabilitation

## 2024-03-18 ENCOUNTER — Encounter: Payer: Self-pay | Admitting: Physical Medicine and Rehabilitation

## 2024-03-18 ENCOUNTER — Ambulatory Visit: Admitting: Family Medicine

## 2024-03-18 DIAGNOSIS — M48062 Spinal stenosis, lumbar region with neurogenic claudication: Secondary | ICD-10-CM | POA: Diagnosis not present

## 2024-03-18 DIAGNOSIS — M5442 Lumbago with sciatica, left side: Secondary | ICD-10-CM

## 2024-03-18 DIAGNOSIS — M5416 Radiculopathy, lumbar region: Secondary | ICD-10-CM

## 2024-03-18 DIAGNOSIS — G8929 Other chronic pain: Secondary | ICD-10-CM | POA: Diagnosis not present

## 2024-03-18 DIAGNOSIS — R269 Unspecified abnormalities of gait and mobility: Secondary | ICD-10-CM

## 2024-03-18 MED ORDER — CYCLOBENZAPRINE HCL 5 MG PO TABS
5.0000 mg | ORAL_TABLET | Freq: Three times a day (TID) | ORAL | 1 refills | Status: AC | PRN
Start: 2024-03-18 — End: ?

## 2024-03-18 MED ORDER — TRAMADOL HCL 50 MG PO TABS: 50.0000 mg | ORAL_TABLET | Freq: Three times a day (TID) | ORAL | 0 refills | Status: DC | PRN

## 2024-03-18 NOTE — Progress Notes (Deleted)
   Acute Office Visit  Subjective:     Patient ID: Joseph Rocha, male    DOB: 1935-04-22, 89 y.o.   MRN: [id number]  No chief complaint on file.   HPI Patient is in today for ***  ROS Per HPI      Objective:    There were no vitals taken for this visit.   Physical Exam Vitals and nursing note reviewed.  Constitutional:      General: He is not in acute distress.    Appearance: Normal appearance.  HENT:     Head: Normocephalic and atraumatic.     Right Ear: External ear normal.     Left Ear: External ear normal.     Nose: Nose normal.     Mouth/Throat:     Mouth: Mucous membranes are moist.     Pharynx: Oropharynx is clear.  Eyes:     Extraocular Movements: Extraocular movements intact.  Cardiovascular:     Rate and Rhythm: Normal rate and regular rhythm.     Pulses: Normal pulses.     Heart sounds: Normal heart sounds.  Pulmonary:     Effort: Pulmonary effort is normal. No respiratory distress.     Breath sounds: Normal breath sounds. No wheezing, rhonchi or rales.  Musculoskeletal:        General: Normal range of motion.     Cervical back: Normal range of motion.     Right lower leg: No edema.     Left lower leg: No edema.  Lymphadenopathy:     Cervical: No cervical adenopathy.  Skin:    General: Skin is warm and dry.  Neurological:     General: No focal deficit present.     Mental Status: He is alert and oriented to person, place, and time.  Psychiatric:        Mood and Affect: Mood normal.        Behavior: Behavior normal.     No results found for any visits on 03/18/24.      Assessment & Plan:   There are no diagnoses linked to this encounter.   No orders of the defined types were placed in this encounter.   No follow-ups on file.  Wellington Half, FNP

## 2024-03-18 NOTE — Progress Notes (Signed)
 Core Outcome Measures Index (COMI) Back Score  Average Pain 7  COMI Score 70%
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
## 2024-03-18 NOTE — Progress Notes (Signed)
 Pain Scale   Average Pain 7 Patient advising that his pain increases with walking. Patient states when lays down at night on his left side the pain increases, patient states he takes Tramadol  and Meloxicam  PRN         +Driver, -BT, -Dye Allergies.

## 2024-03-28 ENCOUNTER — Other Ambulatory Visit: Payer: Self-pay | Admitting: Internal Medicine

## 2024-03-31 ENCOUNTER — Other Ambulatory Visit: Payer: Self-pay

## 2024-04-02 DIAGNOSIS — Z9889 Other specified postprocedural states: Secondary | ICD-10-CM | POA: Diagnosis not present

## 2024-04-02 DIAGNOSIS — E291 Testicular hypofunction: Secondary | ICD-10-CM | POA: Diagnosis not present

## 2024-04-02 DIAGNOSIS — E559 Vitamin D deficiency, unspecified: Secondary | ICD-10-CM | POA: Diagnosis not present

## 2024-04-02 DIAGNOSIS — N183 Chronic kidney disease, stage 3 unspecified: Secondary | ICD-10-CM | POA: Diagnosis not present

## 2024-04-02 DIAGNOSIS — E2749 Other adrenocortical insufficiency: Secondary | ICD-10-CM | POA: Diagnosis not present

## 2024-04-02 DIAGNOSIS — E1165 Type 2 diabetes mellitus with hyperglycemia: Secondary | ICD-10-CM | POA: Diagnosis not present

## 2024-04-02 DIAGNOSIS — E039 Hypothyroidism, unspecified: Secondary | ICD-10-CM | POA: Diagnosis not present

## 2024-04-02 NOTE — Progress Notes (Signed)
 Not seen

## 2024-04-08 ENCOUNTER — Ambulatory Visit (INDEPENDENT_AMBULATORY_CARE_PROVIDER_SITE_OTHER): Admitting: Physical Medicine and Rehabilitation

## 2024-04-08 ENCOUNTER — Telehealth: Payer: Self-pay | Admitting: Internal Medicine

## 2024-04-08 ENCOUNTER — Other Ambulatory Visit: Payer: Self-pay

## 2024-04-08 VITALS — BP 100/63 | HR 73

## 2024-04-08 DIAGNOSIS — M5416 Radiculopathy, lumbar region: Secondary | ICD-10-CM

## 2024-04-08 DIAGNOSIS — M79671 Pain in right foot: Secondary | ICD-10-CM

## 2024-04-08 MED ORDER — METHYLPREDNISOLONE ACETATE 40 MG/ML IJ SUSP
40.0000 mg | Freq: Once | INTRAMUSCULAR | Status: AC
  Administered 2024-04-08: 40 mg

## 2024-04-08 NOTE — Telephone Encounter (Signed)
 Copied from CRM [id number]. Topic: Clinical - Medication Question >> Apr 08, 2024  3:06 PM Shereese L wrote: Reason for CRM: patient would like to receive a call from doctor or nurse in reference to getting a prosthetic lift for foot

## 2024-04-08 NOTE — Patient Instructions (Signed)

## 2024-04-08 NOTE — Progress Notes (Unsigned)
 Pain Scale   Average Pain 6 Patient advising he has chronic lower back radiating to his hips. Patient advising his pain increase when standing for log periods of time.        +Driver, -BT, -Dye Allergies.

## 2024-04-09 NOTE — Telephone Encounter (Signed)
 Ok this is done

## 2024-04-09 NOTE — Progress Notes (Signed)
 ETHELBERT THAIN - 89 y.o. male MRN [id number]  Date of birth: [birth year]  Office Visit Note: Visit Date: 04/08/2024 PCP: Roslyn Coombe, MD Referred by: Roslyn Coombe, MD  Subjective: Chief Complaint  Patient presents with   Lower Back - Pain   HPI:  BENIAH MAGNAN is a 89 y.o. male who comes in today at the request of Elvan Hamel, FNP for planned Left L5-S1 Lumbar Interlaminar epidural steroid injection with fluoroscopic guidance.  The patient has failed conservative care including home exercise, medications, time and activity modification.  This injection will be diagnostic and hopefully therapeutic.  Please see requesting physician notes for further details and justification.   ROS Otherwise per HPI.  Assessment & Plan: Visit Diagnoses:    ICD-10-CM   1. Lumbar radiculopathy  M54.16 XR C-ARM NO REPORT    Epidural Steroid injection    methylPREDNISolone  acetate (DEPO-MEDROL ) injection 40 mg      Plan: No additional findings.   Meds & Orders:  Meds ordered this encounter  Medications   methylPREDNISolone  acetate (DEPO-MEDROL ) injection 40 mg    Orders Placed This Encounter  Procedures   XR C-ARM NO REPORT   Epidural Steroid injection    Follow-up: Return if symptoms worsen or fail to improve.   Procedures: No procedures performed  Lumbar Epidural Steroid Injection - Interlaminar Approach with Fluoroscopic Guidance  Patient: WIATT MAHABIR      Date of Birth: Oct 15, 1935 MRN: [id number] PCP: Roslyn Coombe, MD      Visit Date: 04/08/2024   Universal Protocol:     Consent Given By: the patient  Position: PRONE  Additional Comments: Vital signs were monitored before and after the procedure. Patient was prepped and draped in the usual sterile fashion. The correct patient, procedure, and site was verified.   Injection Procedure Details:   Procedure diagnoses: Lumbar radiculopathy [M54.16]   Meds Administered:  Meds ordered this encounter  Medications    methylPREDNISolone  acetate (DEPO-MEDROL ) injection 40 mg     Laterality: Left  Location/Site:  L5-S1  Needle: 3.5 in., 20 ga. Tuohy  Needle Placement: Paramedian epidural  Findings:   -Comments: Excellent flow of contrast into the epidural space.  Procedure Details: Using a paramedian approach from the side mentioned above, the region overlying the inferior lamina was localized under fluoroscopic visualization and the soft tissues overlying this structure were infiltrated with 4 ml. of 1% Lidocaine  without Epinephrine . The Tuohy needle was inserted into the epidural space using a paramedian approach.   The epidural space was localized using loss of resistance along with counter oblique bi-planar fluoroscopic views.  After negative aspirate for air, blood, and CSF, a 2 ml. volume of Isovue -250 was injected into the epidural space and the flow of contrast was observed. Radiographs were obtained for documentation purposes.    The injectate was administered into the level noted above.   Additional Comments:  The patient tolerated the procedure well Dressing: 2 x 2 sterile gauze and Band-Aid    Post-procedure details: Patient was observed during the procedure. Post-procedure instructions were reviewed.  Patient left the clinic in stable condition.   Clinical History: Narrative & Impression CLINICAL DATA:  Lumbar radicular pain. Increased fracture risk. Left-sided low back and hip pain.   EXAM: MRI LUMBAR SPINE WITHOUT CONTRAST   TECHNIQUE: Multiplanar, multisequence MR imaging of the lumbar spine was performed. No intravenous contrast was administered.   COMPARISON:  08/05/2006   FINDINGS: Segmentation:  5 lumbar type vertebral  bodies.   Alignment:  2 mm degenerative anterolisthesis L4-5.   Vertebrae:  No fracture or focal bone lesion.   Conus medullaris and cauda equina: Conus extends to the T12 level. Conus and cauda equina appear normal.   Paraspinal and  other soft tissues: Negative   Disc levels:   No significant finding at T11-12 or T12-L1.   L1-2: Disc bulge.  Mild facet hypertrophy.  No compressive stenosis.   L2-3: Circumferential protrusion of the disc. Facet and ligamentous hypertrophy. Moderate to severe multifactorial stenosis which could cause neural compression. This has worsened since 2007.   L3-4: Bulging of the disc. Facet and ligamentous hypertrophy worse on the left. Stenosis of both lateral recesses and foramina, left worse than right. Neural compression could occur at this level, particularly on the left.   L4-5: Facet arthropathy with 2 mm of anterolisthesis. Broad-based herniation of the disc. Severe multifactorial spinal stenosis that could cause neural compression on either or both sides. Considerable worsening since 2007.   L5-S1: Endplate osteophytes and broad-based herniation of the disc more prominent towards the right. Facet and ligamentous hypertrophy. Severe stenosis of the canal, lateral recesses and neural foramina that could cause neural compression on either or both sides. Some caudal migration of disc material in the right lateral recess. Considerable worsening since 2007.   IMPRESSION: 1. L2-3: Circumferential protrusion of the disc. Facet and ligamentous hypertrophy. Moderate to severe multifactorial stenosis which could cause neural compression. This has worsened since 2007. 2. L3-4: Bulging of the disc. Facet and ligamentous hypertrophy worse on the left. Stenosis of both lateral recesses and foramina, left worse than right. Neural compression could occur at this level, particularly on the left. 3. L4-5: Facet arthropathy with 2 mm of anterolisthesis. Broad-based herniation of the disc. Severe multifactorial spinal stenosis that could cause neural compression on either or both sides. Considerable worsening since 2007. 4. L5-S1: Endplate osteophytes and broad-based herniation of the disc  more prominent towards the right. Facet and ligamentous hypertrophy. Severe stenosis of the canal, lateral recesses and neural foramina that could cause neural compression on either or both sides. Some caudal migration of disc material in the right lateral recess. Considerable worsening since 2007.     Electronically Signed   By: Bettylou Brunner M.D.   On: 03/16/2024 14:54     Objective:  VS:  HT:    WT:   BMI:     BP:100/63  HR:73bpm  TEMP: ( )  RESP:  Physical Exam Vitals and nursing note reviewed.  Constitutional:      General: He is not in acute distress.    Appearance: Normal appearance. He is not ill-appearing.  HENT:     Head: Normocephalic and atraumatic.     Right Ear: External ear normal.     Left Ear: External ear normal.     Nose: No congestion.  Eyes:     Extraocular Movements: Extraocular movements intact.  Cardiovascular:     Rate and Rhythm: Normal rate.     Pulses: Normal pulses.  Pulmonary:     Effort: Pulmonary effort is normal. No respiratory distress.  Abdominal:     General: There is no distension.     Palpations: Abdomen is soft.  Musculoskeletal:        General: No tenderness or signs of injury.     Cervical back: Neck supple.     Right lower leg: No edema.     Left lower leg: No edema.  Comments: Patient has good distal strength without clonus.  Skin:    Findings: No erythema or rash.  Neurological:     General: No focal deficit present.     Mental Status: He is alert and oriented to person, place, and time.     Sensory: No sensory deficit.     Motor: No weakness or abnormal muscle tone.     Coordination: Coordination normal.  Psychiatric:        Mood and Affect: Mood normal.        Behavior: Behavior normal.      Imaging: XR C-ARM NO REPORT Result Date: 04/08/2024 Please see Notes tab for imaging impression.

## 2024-04-09 NOTE — Procedures (Signed)
 Lumbar Epidural Steroid Injection - Interlaminar Approach with Fluoroscopic Guidance  Patient: Joseph Rocha      Date of Birth: [birth year] MRN: [id number] PCP: Roslyn Coombe, MD      Visit Date: 04/08/2024   Universal Protocol:     Consent Given By: the patient  Position: PRONE  Additional Comments: Vital signs were monitored before and after the procedure. Patient was prepped and draped in the usual sterile fashion. The correct patient, procedure, and site was verified.   Injection Procedure Details:   Procedure diagnoses: Lumbar radiculopathy [M54.16]   Meds Administered:  Meds ordered this encounter  Medications   methylPREDNISolone  acetate (DEPO-MEDROL ) injection 40 mg     Laterality: Left  Location/Site:  L5-S1  Needle: 3.5 in., 20 ga. Tuohy  Needle Placement: Paramedian epidural  Findings:   -Comments: Excellent flow of contrast into the epidural space.  Procedure Details: Using a paramedian approach from the side mentioned above, the region overlying the inferior lamina was localized under fluoroscopic visualization and the soft tissues overlying this structure were infiltrated with 4 ml. of 1% Lidocaine  without Epinephrine . The Tuohy needle was inserted into the epidural space using a paramedian approach.   The epidural space was localized using loss of resistance along with counter oblique bi-planar fluoroscopic views.  After negative aspirate for air, blood, and CSF, a 2 ml. volume of Isovue -250 was injected into the epidural space and the flow of contrast was observed. Radiographs were obtained for documentation purposes.    The injectate was administered into the level noted above.   Additional Comments:  The patient tolerated the procedure well Dressing: 2 x 2 sterile gauze and Band-Aid    Post-procedure details: Patient was observed during the procedure. Post-procedure instructions were reviewed.  Patient left the clinic in stable condition.

## 2024-04-09 NOTE — Telephone Encounter (Signed)
 Sorry, I would not normally handle that, but we could refer to Sports Medicine, or Podiatry who may be able to prescribe this    thanks

## 2024-04-14 DIAGNOSIS — E291 Testicular hypofunction: Secondary | ICD-10-CM | POA: Diagnosis not present

## 2024-04-16 ENCOUNTER — Ambulatory Visit: Payer: Medicare Other | Admitting: Internal Medicine

## 2024-04-22 ENCOUNTER — Encounter: Payer: Self-pay | Admitting: Internal Medicine

## 2024-04-22 ENCOUNTER — Ambulatory Visit (INDEPENDENT_AMBULATORY_CARE_PROVIDER_SITE_OTHER): Admitting: Internal Medicine

## 2024-04-22 ENCOUNTER — Ambulatory Visit: Payer: Self-pay | Admitting: Internal Medicine

## 2024-04-22 VITALS — BP 122/72 | HR 56 | Temp 98.1°F | Ht 75.0 in | Wt 192.0 lb

## 2024-04-22 DIAGNOSIS — E559 Vitamin D deficiency, unspecified: Secondary | ICD-10-CM | POA: Diagnosis not present

## 2024-04-22 DIAGNOSIS — E538 Deficiency of other specified B group vitamins: Secondary | ICD-10-CM | POA: Diagnosis not present

## 2024-04-22 DIAGNOSIS — E119 Type 2 diabetes mellitus without complications: Secondary | ICD-10-CM

## 2024-04-22 DIAGNOSIS — R252 Cramp and spasm: Secondary | ICD-10-CM | POA: Diagnosis not present

## 2024-04-22 DIAGNOSIS — E1169 Type 2 diabetes mellitus with other specified complication: Secondary | ICD-10-CM | POA: Diagnosis not present

## 2024-04-22 DIAGNOSIS — E1165 Type 2 diabetes mellitus with hyperglycemia: Secondary | ICD-10-CM

## 2024-04-22 DIAGNOSIS — N1831 Chronic kidney disease, stage 3a: Secondary | ICD-10-CM

## 2024-04-22 DIAGNOSIS — Z7984 Long term (current) use of oral hypoglycemic drugs: Secondary | ICD-10-CM | POA: Diagnosis not present

## 2024-04-22 DIAGNOSIS — E785 Hyperlipidemia, unspecified: Secondary | ICD-10-CM

## 2024-04-22 DIAGNOSIS — N401 Enlarged prostate with lower urinary tract symptoms: Secondary | ICD-10-CM | POA: Insufficient documentation

## 2024-04-22 LAB — BASIC METABOLIC PANEL WITH GFR
BUN: 21 mg/dL (ref 6–23)
CO2: 30 meq/L (ref 19–32)
Calcium: 8.9 mg/dL (ref 8.4–10.5)
Chloride: 103 meq/L (ref 96–112)
Creatinine, Ser: 1.06 mg/dL (ref 0.40–1.50)
GFR: 63.23 mL/min (ref >60.00)
Glucose, Bld: 93 mg/dL (ref 70–99)
Potassium: 3.9 meq/L (ref 3.5–5.1)
Sodium: 137 meq/L (ref 135–145)

## 2024-04-22 LAB — LIPID PANEL
Cholesterol: 153 mg/dL (ref 0–200)
HDL: 55 mg/dL (ref >39.00)
LDL Cholesterol: 80 mg/dL (ref 0–99)
NonHDL: 97.92
Total CHOL/HDL Ratio: 3
Triglycerides: 92 mg/dL (ref 0.0–149.0)
VLDL: 18.4 mg/dL (ref 0.0–40.0)

## 2024-04-22 LAB — CBC WITH DIFFERENTIAL/PLATELET
Basophils Absolute: 0 10*3/uL (ref 0.0–0.1)
Basophils Relative: 0.4 % (ref 0.0–3.0)
Eosinophils Absolute: 0.2 10*3/uL (ref 0.0–0.7)
Eosinophils Relative: 2.1 % (ref 0.0–5.0)
HCT: 35.6 % — ABNORMAL LOW (ref 39.0–52.0)
Hemoglobin: 12.2 g/dL — ABNORMAL LOW (ref 13.0–17.0)
Lymphocytes Relative: 20.5 % (ref 12.0–46.0)
Lymphs Abs: 1.8 10*3/uL (ref 0.7–4.0)
MCHC: 34.2 g/dL (ref 30.0–36.0)
MCV: 91.8 fl (ref 78.0–100.0)
Monocytes Absolute: 0.8 10*3/uL (ref 0.1–1.0)
Monocytes Relative: 9.3 % (ref 3.0–12.0)
Neutro Abs: 6 10*3/uL (ref 1.4–7.7)
Neutrophils Relative %: 67.7 % (ref 43.0–77.0)
Platelets: 209 10*3/uL (ref 150.0–400.0)
RBC: 3.88 Mil/uL — ABNORMAL LOW (ref 4.22–5.81)
RDW: 14.8 % (ref 11.5–15.5)
WBC: 8.9 10*3/uL (ref 4.0–10.5)

## 2024-04-22 LAB — MAGNESIUM: Magnesium: 1.3 mg/dL — ABNORMAL LOW (ref 1.5–2.5)

## 2024-04-22 LAB — HEPATIC FUNCTION PANEL
ALT: 14 U/L (ref 0–53)
AST: 16 U/L (ref 0–37)
Albumin: 4 g/dL (ref 3.5–5.2)
Alkaline Phosphatase: 42 U/L (ref 39–117)
Bilirubin, Direct: 0.1 mg/dL (ref 0.0–0.3)
Total Bilirubin: 0.4 mg/dL (ref 0.2–1.2)
Total Protein: 6.8 g/dL (ref 6.0–8.3)

## 2024-04-22 LAB — TSH: TSH: 1.53 u[IU]/mL (ref 0.35–5.50)

## 2024-04-22 LAB — VITAMIN B12: Vitamin B-12: 562 pg/mL (ref 211–911)

## 2024-04-22 LAB — HEMOGLOBIN A1C: Hgb A1c MFr Bld: 6.5 % (ref 4.6–6.5)

## 2024-04-22 MED ORDER — METHOCARBAMOL 500 MG PO TABS: ORAL_TABLET | ORAL | 2 refills | Status: AC

## 2024-04-22 NOTE — Progress Notes (Signed)
 Letter sent, cont same tx  The test results show that your current treatment is OK, as the tests are stable except the magnesium  is still low.  .  Please continue the same plan and take the OTC magnesium .    There is no other need for change of treatment or further evaluation based on these results, at this time.  thanks

## 2024-04-22 NOTE — Patient Instructions (Addendum)
 Ok to take OTC magnesium  oxide at 400 mg per day  Please take all new medication as prescribed - the robaxin  for cramps at night at bedtime  Please continue all other medications as before, and refills have been done if requested.  Please have the pharmacy call with any other refills you may need.  Please continue your efforts at being more active, low cholesterol diet, and weight control.  You are otherwise up to date with prevention measures today.  Please keep your appointments with your specialists as you may have planned  Please go to the LAB at the blood drawing area for the tests to be done  You will be contacted by phone if any changes need to be made immediately.  Otherwise, you will receive a letter about your results with an explanation, but please check with MyChart first.  Please make an Appointment to return in 6 months, or sooner if needed
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
## 2024-04-23 ENCOUNTER — Ambulatory Visit: Admitting: Podiatry

## 2024-04-25 ENCOUNTER — Encounter: Payer: Self-pay | Admitting: Internal Medicine

## 2024-04-25 NOTE — Assessment & Plan Note (Signed)
 Lab Results  Component Value Date   HGBA1C 6.5 04/22/2024   Stable, pt to continue current medical treatment metformin  500 bid, prandin  2 mg bid

## 2024-04-25 NOTE — Assessment & Plan Note (Signed)
 Lab Results  Component Value Date   CREATININE 1.06 04/22/2024   Stable overall, cont to avoid nephrotoxins

## 2024-04-25 NOTE — Assessment & Plan Note (Addendum)
 Lab Results  Component Value Date   LDLCALC 80 04/22/2024   Uncontrolled,  pt to continue current statin crestor  5 mg, declines add zetia  for now

## 2024-04-25 NOTE — Assessment & Plan Note (Signed)
 Not better with flexeril , ok for robaxin  50 0mg  at bedtime prn

## 2024-04-25 NOTE — Assessment & Plan Note (Signed)
 Also for check magnesium  level, likely still low, pt encouraged to take otc tx

## 2024-04-25 NOTE — Assessment & Plan Note (Signed)
Last vitamin D Lab Results  Component Value Date   VD25OH 78.85 07/17/2023   Stable, cont oral replacement

## 2024-04-29 ENCOUNTER — Encounter: Payer: Self-pay | Admitting: Physical Medicine and Rehabilitation

## 2024-04-29 ENCOUNTER — Ambulatory Visit: Admitting: Physical Medicine and Rehabilitation

## 2024-04-29 DIAGNOSIS — E291 Testicular hypofunction: Secondary | ICD-10-CM | POA: Diagnosis not present

## 2024-04-29 DIAGNOSIS — N401 Enlarged prostate with lower urinary tract symptoms: Secondary | ICD-10-CM | POA: Diagnosis not present

## 2024-04-29 DIAGNOSIS — M48062 Spinal stenosis, lumbar region with neurogenic claudication: Secondary | ICD-10-CM

## 2024-04-29 DIAGNOSIS — M5416 Radiculopathy, lumbar region: Secondary | ICD-10-CM

## 2024-04-29 DIAGNOSIS — G8929 Other chronic pain: Secondary | ICD-10-CM

## 2024-04-29 DIAGNOSIS — M5442 Lumbago with sciatica, left side: Secondary | ICD-10-CM | POA: Diagnosis not present

## 2024-04-29 DIAGNOSIS — R3912 Poor urinary stream: Secondary | ICD-10-CM | POA: Diagnosis not present

## 2024-04-29 DIAGNOSIS — R269 Unspecified abnormalities of gait and mobility: Secondary | ICD-10-CM

## 2024-04-29 DIAGNOSIS — R351 Nocturia: Secondary | ICD-10-CM | POA: Diagnosis not present

## 2024-04-29 DIAGNOSIS — M21371 Foot drop, right foot: Secondary | ICD-10-CM

## 2024-04-29 NOTE — Progress Notes (Signed)
 Pain Scale   Average Pain 4 Patient received injection on 04/08/24, patient advising that his pain has decreased, however it still is present and bothers him some days.        +Driver, -BT, -Dye Allergies.
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
## 2024-04-30 ENCOUNTER — Ambulatory Visit: Admitting: Podiatry

## 2024-04-30 ENCOUNTER — Encounter: Payer: Self-pay | Admitting: Podiatry

## 2024-04-30 ENCOUNTER — Ambulatory Visit (INDEPENDENT_AMBULATORY_CARE_PROVIDER_SITE_OTHER)

## 2024-04-30 DIAGNOSIS — M25571 Pain in right ankle and joints of right foot: Secondary | ICD-10-CM | POA: Diagnosis not present

## 2024-04-30 DIAGNOSIS — M21371 Foot drop, right foot: Secondary | ICD-10-CM | POA: Diagnosis not present

## 2024-04-30 NOTE — Progress Notes (Signed)
 Patient presents today with complaint of a dropfoot.  He said this for many years now.  Previously had a dropfoot brace many years ago but he has not 1 use 1 in several years.  Has been having trouble with gait because of the dropfoot.  Also complains of some aching in the midfoot on the right foot at times.  This happens generally with weightbearing and the more active he is   Physical exam:  General appearance: Pleasant, and in no acute distress. AOx3.  Vascular: Pedal pulses: DP 2/4 bilaterally, PT 1/4 bilaterally.  Mild edema lower legs bilaterally. Capillary fill time immediate bilaterally.  Neurological: Light touch intact feet bilaterally.  Absent Achilles tendon reflex right no clonus or spasticity noted.  Occasional tingling and burning in right foot  Dermatologic:   Skin normal temperature bilaterally.  Skin normal color, tone, and texture bilaterally.   Musculoskeletal: Tenderness on dorsal left foot with palpation over the second metatarsal cuneiform joint.  Right.  On the right with loss 1/+5 muscle strength in dorsiflexion right.  +5 or +5 muscle strength dorsiflexion on the left  Radiographs: Foot 3 views right: Joint space narrowing at metatarsal cuneiform joints especially the second.  Osteophytic changes noted.  Os osteophytic changes posterior aspect calcaneus.  Some osteopenia present.  No evidence of any bone tumors.  Normal ankle joint space on lateral view  Diagnosis: 1 dropfoot deformity right foot 2.  Arthralgia metatarsal cuneiform joints right foot.  Plan: -New patient office visit level 3 for evaluation and management. - Discussed with him a dropfoot brace.  He says he had one several years ago.  Will get him set up with 1 of these.  I think would benefit make his gait much safer and stable -Order dropfoot brace right static  Return as needed

## 2024-05-27 ENCOUNTER — Other Ambulatory Visit

## 2024-06-11 ENCOUNTER — Ambulatory Visit

## 2024-06-11 NOTE — Progress Notes (Signed)
 Patient was present and consulted with him in re; to Right AFO brace for Right foot drop  Patient has had a brace prior but was a solid AFO and patient said it was heavy and very uncomfortable and did not like the posterior backing  Patient was shown the walk on AFO carbon fiber anterior Afo brace  Patient tried this brace on and our Protect 3D model as well and preferred the Walk on AFO by [employer]  Patient displayed increased dorsi flexion and was more at ease with an increase in  Dynamic Balance.  I am ordering brace Right SZ medium  for patient when in patient will be called to set up apt for fitting

## 2024-06-17 ENCOUNTER — Other Ambulatory Visit: Payer: Self-pay | Admitting: Internal Medicine

## 2024-06-17 DIAGNOSIS — E1165 Type 2 diabetes mellitus with hyperglycemia: Secondary | ICD-10-CM

## 2024-06-17 DIAGNOSIS — E559 Vitamin D deficiency, unspecified: Secondary | ICD-10-CM

## 2024-06-17 DIAGNOSIS — E538 Deficiency of other specified B group vitamins: Secondary | ICD-10-CM

## 2024-06-18 ENCOUNTER — Telehealth: Payer: Self-pay

## 2024-06-18 NOTE — Telephone Encounter (Signed)
 LVM to schedule AFO/ brace fitting/ delivery

## 2024-06-23 ENCOUNTER — Ambulatory Visit

## 2024-06-25 NOTE — Progress Notes (Signed)
 AFO was sent for the wrong side was suppose to be right   Need to check to make sure correct side is sent Right   Then call for fitting thank you  Lolita Schultze Cped, CFo, CFm

## 2024-07-01 DIAGNOSIS — M170 Bilateral primary osteoarthritis of knee: Secondary | ICD-10-CM | POA: Diagnosis not present

## 2024-07-01 DIAGNOSIS — R262 Difficulty in walking, not elsewhere classified: Secondary | ICD-10-CM | POA: Diagnosis not present

## 2024-07-01 DIAGNOSIS — M25562 Pain in left knee: Secondary | ICD-10-CM | POA: Diagnosis not present

## 2024-07-01 DIAGNOSIS — M25561 Pain in right knee: Secondary | ICD-10-CM | POA: Diagnosis not present

## 2024-07-08 DIAGNOSIS — R262 Difficulty in walking, not elsewhere classified: Secondary | ICD-10-CM | POA: Diagnosis not present

## 2024-07-08 DIAGNOSIS — M25561 Pain in right knee: Secondary | ICD-10-CM | POA: Diagnosis not present

## 2024-07-08 DIAGNOSIS — M170 Bilateral primary osteoarthritis of knee: Secondary | ICD-10-CM | POA: Diagnosis not present

## 2024-07-08 DIAGNOSIS — M25562 Pain in left knee: Secondary | ICD-10-CM | POA: Diagnosis not present

## 2024-07-10 ENCOUNTER — Ambulatory Visit: Payer: Self-pay

## 2024-07-10 NOTE — Telephone Encounter (Signed)
 FYI Only or Action Required?: FYI only for provider.  Patient was last seen in primary care on 04/22/2024 by Norleen Lynwood ORN, MD.  Called Nurse Triage reporting Dizziness.  Symptoms began yesterday.  Interventions attempted: Nothing.  Symptoms are: unchanged.  Triage Disposition: Go to ED Now (or PCP Triage)  Patient/caregiver understands and will follow disposition?: Unsure         Copied from CRM [id number]. Topic: Clinical - Red Word Triage >> Jul 10, 2024 12:37 PM Suzen RAMAN wrote: Red Word that prompted transfer to Nurse Triage: lightheadness,dizziness, and nausea            Reason for Disposition  [1] Dizziness (vertigo) present now AND [2] age > 56  (Exception: Prior doctor or NP/PA evaluation for this AND no different/worse than usual.)  Answer Assessment - Initial Assessment Questions 1. DESCRIPTION: Describe your dizziness.     I feel lightheaded  2. VERTIGO: Do you feel like either you or the room is spinning or tilting?      Spinning this morning  3. LIGHTHEADED: Do you feel lightheaded? (e.g., somewhat faint, woozy, weak upon standing)     Yes 4. SEVERITY: How bad is it?  Can you walk?     Feels weak when standing up 5. ONSET:  When did the dizziness begin?     Yesterday  6. AGGRAVATING FACTORS: Does anything make it worse? (e.g., standing, change in head position)     Standing up 7. CAUSE: What do you think is causing the dizziness?     Unsure  8. RECURRENT SYMPTOM: Have you had dizziness before? If Yes, ask: When was the last time? What happened that time?     Yes a long time ago 9. OTHER SYMPTOMS: Do you have any other symptoms? (e.g., earache, headache, numbness, tinnitus, vomiting, weakness)     Nausea  Protocols used: Dizziness - Vertigo-A-AH

## 2024-07-11 ENCOUNTER — Emergency Department (HOSPITAL_COMMUNITY): Admission: EM | Admit: 2024-07-11 | Discharge: 2024-07-11 | Disposition: A | Discharge status: Home / Self Care

## 2024-07-11 ENCOUNTER — Other Ambulatory Visit (HOSPITAL_COMMUNITY): Payer: Self-pay

## 2024-07-11 DIAGNOSIS — G4489 Other headache syndrome: Secondary | ICD-10-CM | POA: Diagnosis not present

## 2024-07-11 DIAGNOSIS — R42 Dizziness and giddiness: Secondary | ICD-10-CM | POA: Diagnosis not present

## 2024-07-11 DIAGNOSIS — R112 Nausea with vomiting, unspecified: Secondary | ICD-10-CM | POA: Insufficient documentation

## 2024-07-11 DIAGNOSIS — R197 Diarrhea, unspecified: Secondary | ICD-10-CM | POA: Insufficient documentation

## 2024-07-11 DIAGNOSIS — E860 Dehydration: Secondary | ICD-10-CM | POA: Diagnosis not present

## 2024-07-11 DIAGNOSIS — Z7982 Long term (current) use of aspirin: Secondary | ICD-10-CM | POA: Diagnosis not present

## 2024-07-11 DIAGNOSIS — R110 Nausea: Secondary | ICD-10-CM | POA: Diagnosis not present

## 2024-07-11 LAB — CBC
HCT: 35 % — ABNORMAL LOW (ref 39.0–52.0)
Hemoglobin: 12 g/dL — ABNORMAL LOW (ref 13.0–17.0)
MCH: 31.7 pg (ref 26.0–34.0)
MCHC: 34.3 g/dL (ref 30.0–36.0)
MCV: 92.6 fL (ref 80.0–100.0)
Platelets: 180 K/uL (ref 150–400)
RBC: 3.78 MIL/uL — ABNORMAL LOW (ref 4.22–5.81)
RDW: 12.6 % (ref 11.5–15.5)
WBC: 7.2 K/uL (ref 4.0–10.5)
nRBC: 0 % (ref 0.0–0.2)

## 2024-07-11 LAB — COMPREHENSIVE METABOLIC PANEL WITH GFR
ALT: 12 U/L (ref 0–44)
AST: 19 U/L (ref 15–41)
Albumin: 4 g/dL (ref 3.5–5.0)
Alkaline Phosphatase: 58 U/L (ref 38–126)
Anion gap: 11 (ref 5–15)
BUN: 14 mg/dL (ref 8–23)
CO2: 23 mmol/L (ref 22–32)
Calcium: 9.1 mg/dL (ref 8.9–10.3)
Chloride: 94 mmol/L — ABNORMAL LOW (ref 98–111)
Creatinine, Ser: 1.06 mg/dL (ref 0.61–1.24)
GFR, Estimated: >60 mL/min (ref >60)
Glucose, Bld: 99 mg/dL (ref 70–99)
Potassium: 4.4 mmol/L (ref 3.5–5.1)
Sodium: 128 mmol/L — ABNORMAL LOW (ref 135–145)
Total Bilirubin: 0.6 mg/dL (ref 0.0–1.2)
Total Protein: 6.6 g/dL (ref 6.5–8.1)

## 2024-07-11 LAB — URINALYSIS, ROUTINE W REFLEX MICROSCOPIC
Bilirubin Urine: NEGATIVE
Glucose, UA: NEGATIVE mg/dL
Hgb urine dipstick: NEGATIVE
Ketones, ur: NEGATIVE mg/dL
Leukocytes,Ua: NEGATIVE
Nitrite: NEGATIVE
Protein, ur: NEGATIVE mg/dL
Specific Gravity, Urine: 1.01 (ref 1.005–1.030)
pH: 7 (ref 5.0–8.0)

## 2024-07-11 LAB — TROPONIN T, HIGH SENSITIVITY
Troponin T High Sensitivity: 24 ng/L — ABNORMAL HIGH (ref 0–19)
Troponin T High Sensitivity: 22 ng/L — ABNORMAL HIGH (ref 0–19)

## 2024-07-11 LAB — LIPASE, BLOOD: Lipase: 20 U/L (ref 11–51)

## 2024-07-11 MED ORDER — SODIUM CHLORIDE 0.9 % IV BOLUS
500.0000 mL | Freq: Once | INTRAVENOUS | Status: AC
  Administered 2024-07-11: 500 mL via INTRAVENOUS

## 2024-07-11 MED ORDER — ONDANSETRON 4 MG PO TBDP
4.0000 mg | ORAL_TABLET | Freq: Once | ORAL | Status: AC | PRN
  Administered 2024-07-11: 4 mg via ORAL
  Filled 2024-07-11: qty 1

## 2024-07-11 MED ORDER — ONDANSETRON HCL 4 MG PO TABS
4.0000 mg | ORAL_TABLET | Freq: Four times a day (QID) | ORAL | 0 refills | Status: AC
  Filled 2024-07-11: qty 12, 3d supply, fill #0

## 2024-07-11 NOTE — Discharge Instructions (Addendum)
 Drink lots of fluids and eat a bland diet for the next 24 hours.  You can use Zofran  as needed for nausea and vomiting.  Return to the ER for any new or worsening symptoms, otherwise call your primary care doctor Monday morning for follow-up in 1 to 2 weeks.

## 2024-07-11 NOTE — ED Provider Notes (Signed)
 [facility] AT [facility] Provider Note   CSN: [id number] Arrival date & time: 07/11/24  1249     Patient presents with: Nausea, Emesis, and Dizziness   Joseph Rocha is a 89 y.o. male.   89 year old male presents for evaluation of generalized weakness.  States he had some nausea vomiting and diarrhea yesterday and today.  States he still feels nauseous now.  States he has been so weak he is having trouble walking.  Denies any other symptoms or concerns at this time.   Emesis Associated symptoms: diarrhea   Associated symptoms: no abdominal pain, no arthralgias, no chills, no cough, no fever and no sore throat   Dizziness Associated symptoms: diarrhea, nausea and vomiting   Associated symptoms: no chest pain, no palpitations and no shortness of breath        Prior to Admission medications   Medication Sig Start Date End Date Taking? Authorizing Provider  ondansetron  (ZOFRAN ) 4 MG tablet Take 1 tablet (4 mg total) by mouth every 6 (six) hours for 3 days. 07/11/24 07/14/24 Yes Elianis Fischbach L, DO  aspirin  EC 81 MG tablet Take 1 tablet (81 mg total) by mouth daily. Swallow whole. 07/13/20   Norleen Lynwood ORN, MD  azelastine (ASTELIN) 0.1 % nasal spray 1-2 sprays in each nostril Nasally Twice a day prn breakthrough symptoms; Duration: 30 day(s)    [provider]  calcium  carbonate (OS-CAL) 600 MG TABS Take 600 mg by mouth daily.    [provider]  cetirizine  (ZYRTEC ) 10 MG tablet Take 1 tablet (10 mg total) by mouth daily. 10/15/23 10/14/24  Norleen Lynwood ORN, MD  cyclobenzaprine  (FLEXERIL ) 5 MG tablet Take 1 tablet (5 mg total) by mouth 3 (three) times daily as needed. 03/18/24   Williams, Mcclellan Demarais E, NP  dicyclomine  (BENTYL ) 10 MG capsule Take 1 capsule (10 mg total) by mouth 3 (three) times daily as needed for spasms. 01/23/24   Norleen Lynwood ORN, MD  ferrous sulfate  325 (65 FE) MG EC tablet Take 325 mg by mouth daily. 07/26/22   [provider]  finasteride  (PROSCAR ) 5 MG tablet TAKE 1 TABLET DAILY Patient taking differently: Take 5 mg by mouth daily. 04/04/21   Norleen Lynwood ORN, MD  fludrocortisone  (FLORINEF ) 0.1 MG tablet Take 1 tablet (100 mcg total) by mouth daily. 10/09/23   Odell Celinda Balo, MD  Fluocinolone Acetonide 0.01 % OIL SMARTSIG:4 Drop(s) In Ear(s) 1-2 Times Daily PRN    [provider]  fluticasone  (FLONASE ) 50 MCG/ACT nasal spray Place 2 sprays into both nostrils daily. 10/15/18   Rollene Almarie LABOR, MD  folic acid  (FOLVITE ) 1 MG tablet TAKE 1 TABLET DAILY 03/31/24   Norleen Lynwood ORN, MD  gabapentin  (NEURONTIN ) 100 MG capsule Take 1 capsule (100 mg total) by mouth 3 (three) times daily. 02/20/24   Norleen Lynwood ORN, MD  HYDROcodone -acetaminophen  (NORCO/VICODIN) 5-325 MG tablet Take 1 tablet by mouth every 6 (six) hours as needed. 02/20/24   Norleen Lynwood ORN, MD  KLOR-CON  M20 20 MEQ tablet Take 20 mEq by mouth daily.  03/15/13   [provider]  levothyroxine  (SYNTHROID ) 50 MCG tablet Take 1 tablet (50 mcg total) by mouth daily. 03/03/24   Norleen Lynwood ORN, MD  loperamide  (IMODIUM  A-D) 2 MG tablet Take 1 tablet (2 mg total) by mouth 2 (two) times daily as needed for diarrhea or loose stools. 03/25/23   Jerrol Lynwood, MD  Magnesium  200 MG TABS 2 tab by mouth  once daily 01/09/24   Norleen Lynwood ORN, MD  meloxicam  (MOBIC ) 7.5 MG tablet Take 1 tablet (7.5 mg total) by mouth daily as needed for pain. 10/15/23   Norleen Lynwood ORN, MD  metFORMIN  (GLUCOPHAGE ) 500 MG tablet Take 1 tablet (500 mg total) by mouth 2 (two) times daily with a meal. 11/22/23   Norleen Lynwood ORN, MD  methocarbamol  (ROBAXIN ) 500 MG tablet 1-2 tab by mouth at bedtime as needed 04/22/24   Norleen Lynwood ORN, MD  Omega-3 Fatty Acids (FISH OIL ) 1000 MG CAPS Take 1 capsule by mouth daily.    [provider]  omeprazole  (PRILOSEC) 40 MG capsule Take 1 capsule (40 mg total) by mouth daily. 02/03/24   Norleen Lynwood ORN, MD  ondansetron  (ZOFRAN -ODT) 4 MG disintegrating tablet  Take 1 tablet (4 mg total) by mouth every 8 (eight) hours as needed for nausea or vomiting. 10/28/23   Beather Delon Gibson, PA  predniSONE  (DELTASONE ) 10 MG tablet 3 tabs by mouth per day for 3 days,2tabs per day for 3 days,1tab per day for 3 days 02/20/24   Norleen Lynwood ORN, MD  repaglinide  (PRANDIN ) 2 MG tablet Take 1 tablet (2 mg total) by mouth 2 (two) times daily before a meal. TAKE 1 TABLET TWICE DAILY  BEFORE MEALS 11/22/23   Norleen Lynwood ORN, MD  rosuvastatin  (CRESTOR ) 5 MG tablet TAKE 1 TABLET DAILY Patient taking differently: Take 5 mg by mouth daily. 09/23/23   Norleen Lynwood ORN, MD  solifenacin  (VESICARE ) 5 MG tablet Take 1 tablet (5 mg total) by mouth daily. 09/02/23   Norleen Lynwood ORN, MD  Tamsulosin  HCl (FLOMAX ) 0.4 MG CAPS Take 0.4 mg by mouth daily.    [provider]  Testosterone  30 MG/ACT SOLN Place 2 Pump onto the skin daily.    [provider]  traMADol  (ULTRAM ) 50 MG tablet Take 1 tablet (50 mg total) by mouth every 8 (eight) hours as needed. 03/18/24   Williams, Tate Zagal E, NP  triamcinolone  (NASACORT  ALLERGY 24HR) 55 MCG/ACT AERO nasal inhaler 1-2 puff in each nostril Nasally Once a day during seasons of difficulty; Duration: 30 day(s)    [provider]    Allergies: Lipitor [atorvastatin ]    Review of Systems  Constitutional:  Positive for fatigue. Negative for chills and fever.  HENT:  Negative for ear pain and sore throat.   Eyes:  Negative for pain and visual disturbance.  Respiratory:  Negative for cough and shortness of breath.   Cardiovascular:  Negative for chest pain and palpitations.  Gastrointestinal:  Positive for diarrhea, nausea and vomiting. Negative for abdominal pain.  Genitourinary:  Negative for dysuria and hematuria.  Musculoskeletal:  Negative for arthralgias and back pain.  Skin:  Negative for color change and rash.  Neurological:  Positive for dizziness. Negative for seizures and syncope.  All other systems reviewed and are  negative.   Updated Vital Signs BP 136/79   Pulse 62   Temp 98.2 F (36.8 C) (Oral)   Resp 16   SpO2 100%   Physical Exam Vitals and nursing note reviewed.  Constitutional:      General: He is not in acute distress.    Appearance: Normal appearance. He is well-developed. He is not ill-appearing.  HENT:     Head: Normocephalic and atraumatic.  Eyes:     Conjunctiva/sclera: Conjunctivae normal.  Cardiovascular:     Rate and Rhythm: Normal rate and regular rhythm.     Heart sounds: No murmur heard. Pulmonary:  Effort: Pulmonary effort is normal. No respiratory distress.     Breath sounds: Normal breath sounds.  Abdominal:     Palpations: Abdomen is soft.     Tenderness: There is no abdominal tenderness.  Musculoskeletal:        General: No swelling.     Cervical back: Neck supple.  Skin:    General: Skin is warm and dry.     Capillary Refill: Capillary refill takes less than 2 seconds.  Neurological:     Mental Status: He is alert.  Psychiatric:        Mood and Affect: Mood normal.     (all labs ordered are listed, but only abnormal results are displayed) Labs Reviewed  COMPREHENSIVE METABOLIC PANEL WITH GFR - Abnormal; Notable for the following components:      Result Value   Sodium 128 (*)    Chloride 94 (*)    All other components within normal limits  CBC - Abnormal; Notable for the following components:   RBC 3.78 (*)    Hemoglobin 12.0 (*)    HCT 35.0 (*)    All other components within normal limits  TROPONIN T, HIGH SENSITIVITY - Abnormal; Notable for the following components:   Troponin T High Sensitivity 24 (*)    All other components within normal limits  TROPONIN T, HIGH SENSITIVITY - Abnormal; Notable for the following components:   Troponin T High Sensitivity 22 (*)    All other components within normal limits  LIPASE, BLOOD  URINALYSIS, ROUTINE W REFLEX MICROSCOPIC    EKG: EKG Interpretation Date/Time:  Saturday July 11 2024  15:58:47 EDT Ventricular Rate:  69 PR Interval:  182 QRS Duration:  103 QT Interval:  394 QTC Calculation: 423 R Axis:   8  Text Interpretation: Sinus rhythm RSR' in V1 or V2, probably normal variant Baseline wander in lead(s) V2 Compared with prior EKG from 11/06/2023 Confirmed by Gennaro Bouchard ([id number]) on 07/11/2024 4:04:29 PM  Radiology: No results found.   Procedures   Medications Ordered in the ED  ondansetron  (ZOFRAN -ODT) disintegrating tablet 4 mg (4 mg Oral Given 07/11/24 1439)  sodium chloride  0.9 % bolus 500 mL (0 mLs Intravenous Stopped 07/11/24 1608)                                    Medical Decision Making Cardiac monitor interpretation: Sinus rhythm, no ectopy  Patient seen workup reviewed by me and fairly unremarkable.  He was given IV fluids and feeling much better.  Able to ambulate on his own without difficulty.  Vitals are stable.  I did give him Zofran  here as well.  He is able to tolerate p.o.  Will give him prescription for Zofran  to use as needed and advised to drink lots of fluids and keep a bland diet for the next 24 hours.  Advise close follow-up with primary care and otherwise return to the ER for any worsening symptoms.  Patient and wife at bedside comfortable plan be discharged home.  Problems Addressed: Dehydration: acute illness or injury Vomiting and diarrhea: acute illness or injury  Amount and/or Complexity of Data Reviewed External Data Reviewed: notes.    Details: Outpatient records reviewed and patient follows up with primary care for chronic foot drop Labs: ordered. Decision-making details documented in ED Course.    Details: Ordered and reviewed by me and unremarkable. ECG/medicine tests: ordered and independent interpretation performed. Decision-making  details documented in ED Course.    Details: Ordered and interpreted by me in the absence of cardiology sinus rhythm, no STEMI no acute change when compared to prior  Risk OTC  drugs. Prescription drug management. Drug therapy requiring intensive monitoring for toxicity.    Final diagnoses:  Vomiting and diarrhea  Dehydration    ED Discharge Orders          Ordered    ondansetron  (ZOFRAN ) 4 MG tablet  Every 6 hours        07/11/24 1809               Gennaro Duwaine CROME, DO 07/11/24 1925

## 2024-07-11 NOTE — ED Triage Notes (Signed)
 Per EMS from home. C/o headache n/v/d.   BP 138/84 HR 80 97 on RA CBG 110

## 2024-07-11 NOTE — ED Notes (Signed)
 Pt ambulated down hall without any difficulty.

## 2024-07-12 ENCOUNTER — Other Ambulatory Visit (HOSPITAL_COMMUNITY): Payer: Self-pay

## 2024-07-15 ENCOUNTER — Ambulatory Visit: Admitting: Internal Medicine

## 2024-07-15 ENCOUNTER — Encounter: Payer: Self-pay | Admitting: Internal Medicine

## 2024-07-15 VITALS — BP 98/60 | HR 62 | Temp 98.5°F | Ht 75.0 in | Wt 194.4 lb

## 2024-07-15 DIAGNOSIS — R269 Unspecified abnormalities of gait and mobility: Secondary | ICD-10-CM

## 2024-07-15 DIAGNOSIS — R112 Nausea with vomiting, unspecified: Secondary | ICD-10-CM | POA: Diagnosis not present

## 2024-07-15 DIAGNOSIS — N1831 Chronic kidney disease, stage 3a: Secondary | ICD-10-CM | POA: Diagnosis not present

## 2024-07-15 DIAGNOSIS — M170 Bilateral primary osteoarthritis of knee: Secondary | ICD-10-CM | POA: Diagnosis not present

## 2024-07-15 DIAGNOSIS — M25562 Pain in left knee: Secondary | ICD-10-CM | POA: Diagnosis not present

## 2024-07-15 DIAGNOSIS — M21371 Foot drop, right foot: Secondary | ICD-10-CM

## 2024-07-15 DIAGNOSIS — E559 Vitamin D deficiency, unspecified: Secondary | ICD-10-CM

## 2024-07-15 DIAGNOSIS — R262 Difficulty in walking, not elsewhere classified: Secondary | ICD-10-CM | POA: Diagnosis not present

## 2024-07-15 DIAGNOSIS — I951 Orthostatic hypotension: Secondary | ICD-10-CM | POA: Diagnosis not present

## 2024-07-15 DIAGNOSIS — M1990 Unspecified osteoarthritis, unspecified site: Secondary | ICD-10-CM

## 2024-07-15 DIAGNOSIS — M25561 Pain in right knee: Secondary | ICD-10-CM | POA: Diagnosis not present

## 2024-07-15 MED ORDER — FLUDROCORTISONE ACETATE 0.1 MG PO TABS: 100.0000 ug | ORAL_TABLET | Freq: Every day | ORAL | 1 refills | Status: AC

## 2024-07-15 NOTE — Assessment & Plan Note (Signed)
 Pt encouraged to continue oral fluids, but also restart florinef 

## 2024-07-15 NOTE — Assessment & Plan Note (Signed)
Last vitamin D Lab Results  Component Value Date   VD25OH 78.85 07/17/2023   Stable, cont oral replacement

## 2024-07-15 NOTE — Patient Instructions (Signed)
 Your BP was rechecked today  Please restart the Florinef  1 mg per day to keep the BP up  Please continue all other medications as before, and refills have been done if requested.  Please have the pharmacy call with any other refills you may need.  Please keep your appointments with your specialists as you may have planned - orthopedic later today for the both knees gel shots  You will be contacted regarding the referral for: Physical Therapy  No further lab work needed today  Please make an Appointment to return in 6 months, or sooner if needed

## 2024-07-15 NOTE — Assessment & Plan Note (Signed)
 Minimal per pt - for contd zofran  prn

## 2024-07-15 NOTE — Assessment & Plan Note (Signed)
 For right AFO to come in next wk
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
## 2024-07-15 NOTE — Assessment & Plan Note (Signed)
 Also for referral for PT with gait disorder and general weakness, hx of recent near falls

## 2024-07-15 NOTE — Assessment & Plan Note (Signed)
 For bilateral knee gel shots later today with ortho

## 2024-07-15 NOTE — Assessment & Plan Note (Signed)
 Lab Results  Component Value Date   CREATININE 1.06 07/11/2024   Stable overall, cont to avoid nephrotoxins kd

## 2024-07-20 ENCOUNTER — Ambulatory Visit (INDEPENDENT_AMBULATORY_CARE_PROVIDER_SITE_OTHER): Admitting: Internal Medicine

## 2024-07-20 ENCOUNTER — Telehealth: Payer: Self-pay

## 2024-07-20 ENCOUNTER — Encounter: Payer: Self-pay | Admitting: Internal Medicine

## 2024-07-20 ENCOUNTER — Ambulatory Visit: Payer: Self-pay

## 2024-07-20 VITALS — BP 118/64 | HR 61 | Temp 98.1°F | Ht 75.0 in | Wt 196.8 lb

## 2024-07-20 DIAGNOSIS — N1831 Chronic kidney disease, stage 3a: Secondary | ICD-10-CM | POA: Diagnosis not present

## 2024-07-20 DIAGNOSIS — R600 Localized edema: Secondary | ICD-10-CM | POA: Diagnosis not present

## 2024-07-20 DIAGNOSIS — M170 Bilateral primary osteoarthritis of knee: Secondary | ICD-10-CM

## 2024-07-20 DIAGNOSIS — D649 Anemia, unspecified: Secondary | ICD-10-CM

## 2024-07-20 DIAGNOSIS — I951 Orthostatic hypotension: Secondary | ICD-10-CM | POA: Diagnosis not present

## 2024-07-20 NOTE — Assessment & Plan Note (Signed)
 Mild with some wt gain, but pt likely would not tolerate diuretic; etiology unclear as could be swelling related to knee OA effusions, or florinef ,

## 2024-07-20 NOTE — Telephone Encounter (Signed)
 FYI Only or Action Required?: FYI only for provider.  Patient was last seen in primary care on 07/15/2024 by Norleen Lynwood ORN, MD.  Called Nurse Triage reporting Foot Swelling.  Symptoms began a week ago.  Interventions attempted: Rest, hydration, or home remedies.  Symptoms are: gradually worsening.  Triage Disposition: See PCP When Office is Open (Within 3 Days)  Patient/caregiver understands and will follow disposition?: Yes      Copied from CRM #[id number]. Topic: Clinical - Red Word Triage >> Jul 20, 2024  9:51 AM Dedra B wrote: [facility] that prompted transfer to Nurse Triage: Pt experiencing swelling in his R foot and can't put a shoe on. Foot is painful when touched. Warm transfer to nurse triage.       Reason for Disposition  [1] MILD swelling of both ankles (i.e., pedal edema) AND [2] new-onset or getting worse  Answer Assessment - Initial Assessment Questions 1. ONSET: When did the swelling start? (e.g., minutes, hours, days)     1 week ago 2. LOCATION: What part of the leg is swollen?  Are both legs swollen or just one leg?     Bilateral feet, right worse than left  3. SEVERITY: How bad is the swelling? (e.g., localized; mild, moderate, severe)     Mild  4. REDNESS: Is there redness or signs of infection?     No 5. PAIN: Is the swelling painful to touch? If Yes, ask: How painful is it?   (Scale 1-10; mild, moderate or severe)     Pain present when touching foot only 6. FEVER: Do you have a fever? If Yes, ask: What is it, how was it measured, and when did it start?      No 7. CAUSE: What do you think is causing the leg swelling?     Unsure  8. MEDICAL HISTORY: Do you have a history of blood clots (e.g., DVT), cancer, heart failure, kidney disease, or liver failure?     Kidney disease  9. RECURRENT SYMPTOM: Have you had leg swelling before? If Yes, ask: When was the last time? What happened that time?     No 10. OTHER SYMPTOMS: Do you  have any other symptoms? (e.g., chest pain, difficulty breathing)       No  Protocols used: Leg Swelling and Edema-A-AH

## 2024-07-20 NOTE — Telephone Encounter (Signed)
 Patient called and left message on machine asking for status update for AFO.

## 2024-07-20 NOTE — Assessment & Plan Note (Signed)
 With pedal edema - also for cbc r/o worsening anemia

## 2024-07-20 NOTE — Assessment & Plan Note (Signed)
 With recent mild low sodium 128 - for f/u bmp today
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
## 2024-07-20 NOTE — Assessment & Plan Note (Signed)
 Pt advised to continue gel shots as this is unlikely to be related to pedal edema

## 2024-07-20 NOTE — Assessment & Plan Note (Signed)
 Clinically resolved, cont florinef  1 every day for now

## 2024-07-20 NOTE — Patient Instructions (Signed)
Please continue all other medications as before, and refills have been done if requested.  Please have the pharmacy call with any other refills you may need.  Please continue your efforts at being more active, low cholesterol diet, and weight control.  Please keep your appointments with your specialists as you may have planned  Please go to the LAB at the blood drawing area for the tests to be done  You will be contacted by phone if any changes need to be made immediately.  Otherwise, you will receive a letter about your results with an explanation, but please check with MyChart first.   

## 2024-07-21 ENCOUNTER — Telehealth: Payer: Self-pay | Admitting: Internal Medicine

## 2024-07-21 ENCOUNTER — Telehealth: Payer: Self-pay | Admitting: Radiology

## 2024-07-21 ENCOUNTER — Ambulatory Visit: Payer: Self-pay | Admitting: Internal Medicine

## 2024-07-21 LAB — BASIC METABOLIC PANEL WITH GFR
BUN: 24 mg/dL — ABNORMAL HIGH (ref 6–23)
CO2: 26 meq/L (ref 19–32)
Calcium: 9.1 mg/dL (ref 8.4–10.5)
Chloride: 100 meq/L (ref 96–112)
Creatinine, Ser: 1.26 mg/dL (ref 0.40–1.50)
GFR: 51.3 mL/min — ABNORMAL LOW (ref >60.00)
Glucose, Bld: 53 mg/dL — ABNORMAL LOW (ref 70–99)
Potassium: 4 meq/L (ref 3.5–5.1)
Sodium: 135 meq/L (ref 135–145)

## 2024-07-21 LAB — CBC WITH DIFFERENTIAL/PLATELET
Basophils Absolute: 0 K/uL (ref 0.0–0.1)
Basophils Relative: 0.6 % (ref 0.0–3.0)
Eosinophils Absolute: 0.3 K/uL (ref 0.0–0.7)
Eosinophils Relative: 3.7 % (ref 0.0–5.0)
HCT: 33.8 % — ABNORMAL LOW (ref 39.0–52.0)
Hemoglobin: 11.5 g/dL — ABNORMAL LOW (ref 13.0–17.0)
Lymphocytes Relative: 28.7 % (ref 12.0–46.0)
Lymphs Abs: 2 K/uL (ref 0.7–4.0)
MCHC: 34 g/dL (ref 30.0–36.0)
MCV: 93.2 fl (ref 78.0–100.0)
Monocytes Absolute: 0.7 K/uL (ref 0.1–1.0)
Monocytes Relative: 10.2 % (ref 3.0–12.0)
Neutro Abs: 3.9 K/uL (ref 1.4–7.7)
Neutrophils Relative %: 56.8 % (ref 43.0–77.0)
Platelets: 220 K/uL (ref 150.0–400.0)
RBC: 3.62 Mil/uL — ABNORMAL LOW (ref 4.22–5.81)
RDW: 13.1 % (ref 11.5–15.5)
WBC: 6.8 K/uL (ref 4.0–10.5)

## 2024-07-21 LAB — MAGNESIUM: Magnesium: 1.3 mg/dL — ABNORMAL LOW (ref 1.5–2.5)

## 2024-07-21 LAB — HEPATIC FUNCTION PANEL
ALT: 10 U/L (ref 0–53)
AST: 18 U/L (ref 0–37)
Albumin: 4 g/dL (ref 3.5–5.2)
Alkaline Phosphatase: 41 U/L (ref 39–117)
Bilirubin, Direct: 0.1 mg/dL (ref 0.0–0.3)
Total Bilirubin: 0.4 mg/dL (ref 0.2–1.2)
Total Protein: 6.5 g/dL (ref 6.0–8.3)

## 2024-07-21 NOTE — Telephone Encounter (Signed)
 Copied from CRM [id number]. Topic: Clinical - Lab/Test Results >> Jul 21, 2024  4:43 PM Donee H wrote: Reason for CRM: Patient called requesting to speak with pcp Lynwood Rush or his nurse regarding recent lab results. He would like for someone to go over them with him. .Please follow back up with patient at [phone]

## 2024-07-21 NOTE — Telephone Encounter (Signed)
 Copied from CRM [id number]. Topic: Clinical - Lab/Test Results >> Jul 21, 2024  4:43 PM Donee H wrote: Reason for CRM: Patient called requesting to speak with pcp Lynwood Rush or his nurse regarding recent lab results. He would like for someone to go over them with him. .Please follow back up with patient at [phone]

## 2024-07-21 NOTE — Telephone Encounter (Signed)
 Copied from CRM [id number]. Topic: Clinical - Lab/Test Results >> Jul 21, 2024  1:06 PM Mesmerise C wrote: Reason for CRM: Patient was told to call for test results in charts still showing scheduled for released advised patient they're not quite yet when they arer patient would like a call

## 2024-07-22 ENCOUNTER — Telehealth: Payer: Self-pay | Admitting: Radiology

## 2024-07-22 DIAGNOSIS — R262 Difficulty in walking, not elsewhere classified: Secondary | ICD-10-CM | POA: Diagnosis not present

## 2024-07-22 DIAGNOSIS — M25561 Pain in right knee: Secondary | ICD-10-CM | POA: Diagnosis not present

## 2024-07-22 DIAGNOSIS — M25562 Pain in left knee: Secondary | ICD-10-CM | POA: Diagnosis not present

## 2024-07-22 DIAGNOSIS — M170 Bilateral primary osteoarthritis of knee: Secondary | ICD-10-CM | POA: Diagnosis not present

## 2024-07-22 MED ORDER — PREDNISONE 10 MG PO TABS: ORAL_TABLET | ORAL | 0 refills | Status: DC

## 2024-07-22 NOTE — Telephone Encounter (Signed)
 Copied from CRM [id number]. Topic: Clinical - Lab/Test Results >> Jul 21, 2024  4:43 PM Donee H wrote: Reason for CRM: Patient called requesting to speak with pcp Lynwood Rush or his nurse regarding recent lab results. He would like for someone to go over them with him. .Please follow back up with patient at [phone] >> Jul 22, 2024 11:07 AM Franky GRADE wrote: Patient is calling to follow up on the request to review lab results with Dr.John's nurse.

## 2024-07-22 NOTE — Telephone Encounter (Signed)
 Patient called as he is going out of town and needs to know his lab results. Patient provided information on his labs that were also mailed on 9/16. Labs Stable, no changes at this time.   Patient stated he is very worried about his foot as he has stopped taking the 0.1 Florinef  since his Monday appointment, and his foot is still swollen and painful with standing. Swelling does not change when elevated but the pain dose decrease when not weightbearing. Swelling at right ankle and foot, not going up leg.  Patient would like a plan of action action for his foot as it is still bothering him since last visit.

## 2024-07-22 NOTE — Telephone Encounter (Signed)
 Left message with patient wife, [name], okay per DPR. Let her know know that patient medication was sent to [facility]. They can call us  back if have any questins.

## 2024-07-22 NOTE — Telephone Encounter (Addendum)
 Ok to try prednisone  for inflammation - I will send now  To [facility]

## 2024-07-22 NOTE — Addendum Note (Signed)
 Addended by: NORLEEN LYNWOOD ORN on: 07/22/2024 02:28 PM   Modules accepted: Orders

## 2024-07-23 ENCOUNTER — Other Ambulatory Visit (HOSPITAL_COMMUNITY): Payer: Self-pay

## 2024-07-29 DIAGNOSIS — M25562 Pain in left knee: Secondary | ICD-10-CM | POA: Diagnosis not present

## 2024-07-29 DIAGNOSIS — M170 Bilateral primary osteoarthritis of knee: Secondary | ICD-10-CM | POA: Diagnosis not present

## 2024-07-29 DIAGNOSIS — R262 Difficulty in walking, not elsewhere classified: Secondary | ICD-10-CM | POA: Diagnosis not present

## 2024-07-29 DIAGNOSIS — M25561 Pain in right knee: Secondary | ICD-10-CM | POA: Diagnosis not present

## 2024-07-29 NOTE — Telephone Encounter (Signed)
 Called and spoke with patient. Informed him of lab results and notified him that he would also be receiving a print off of these in the mail. Patient expressed understanding

## 2024-07-29 NOTE — Telephone Encounter (Signed)
 Called and spoke with patient on another matter. During call patient did state that his foot was no longer swollen after finishing prednisone  prescription. Patient wants to know if it would be safe to start the florinef  back at this time?

## 2024-07-29 NOTE — Telephone Encounter (Signed)
 Ok to restart the florinef , but if the swelling comes back, we should need to stop

## 2024-07-29 NOTE — Telephone Encounter (Signed)
 Attempted to call patient but had to leave a voice message

## 2024-08-03 NOTE — Telephone Encounter (Signed)
 Joseph Rocha has called in now for 2nd times to be seen day of him calling in for dizziness. He did not want the appointment we had the first time he called so I suggested he see his PCP. Today he calls back wanted to be seen today, I informed him of first appointment we have for dizzy was 08/15/2020. He wanted today. He did not want to make a appointment to have to wait. I will address this again with Ginnie when they get in.

## 2024-08-05 DIAGNOSIS — M25561 Pain in right knee: Secondary | ICD-10-CM | POA: Diagnosis not present

## 2024-08-05 DIAGNOSIS — M25562 Pain in left knee: Secondary | ICD-10-CM | POA: Diagnosis not present

## 2024-08-05 DIAGNOSIS — M170 Bilateral primary osteoarthritis of knee: Secondary | ICD-10-CM | POA: Diagnosis not present

## 2024-08-05 DIAGNOSIS — R262 Difficulty in walking, not elsewhere classified: Secondary | ICD-10-CM | POA: Diagnosis not present

## 2024-08-12 DIAGNOSIS — M25561 Pain in right knee: Secondary | ICD-10-CM | POA: Diagnosis not present

## 2024-08-12 DIAGNOSIS — R262 Difficulty in walking, not elsewhere classified: Secondary | ICD-10-CM | POA: Diagnosis not present

## 2024-08-12 DIAGNOSIS — M170 Bilateral primary osteoarthritis of knee: Secondary | ICD-10-CM | POA: Diagnosis not present

## 2024-08-12 DIAGNOSIS — M25562 Pain in left knee: Secondary | ICD-10-CM | POA: Diagnosis not present

## 2024-08-26 ENCOUNTER — Other Ambulatory Visit

## 2024-08-28 ENCOUNTER — Other Ambulatory Visit: Payer: Self-pay | Admitting: Internal Medicine

## 2024-08-28 ENCOUNTER — Encounter: Payer: Self-pay | Admitting: Internal Medicine

## 2024-08-28 ENCOUNTER — Ambulatory Visit: Payer: Self-pay | Admitting: Internal Medicine

## 2024-08-28 ENCOUNTER — Ambulatory Visit: Admitting: Internal Medicine

## 2024-08-28 ENCOUNTER — Ambulatory Visit (INDEPENDENT_AMBULATORY_CARE_PROVIDER_SITE_OTHER)

## 2024-08-28 VITALS — BP 122/76 | HR 55 | Temp 98.0°F | Ht 75.0 in | Wt 198.0 lb

## 2024-08-28 DIAGNOSIS — I77819 Aortic ectasia, unspecified site: Secondary | ICD-10-CM | POA: Diagnosis not present

## 2024-08-28 DIAGNOSIS — E559 Vitamin D deficiency, unspecified: Secondary | ICD-10-CM

## 2024-08-28 DIAGNOSIS — M549 Dorsalgia, unspecified: Secondary | ICD-10-CM

## 2024-08-28 DIAGNOSIS — M542 Cervicalgia: Secondary | ICD-10-CM | POA: Diagnosis not present

## 2024-08-28 DIAGNOSIS — I700 Atherosclerosis of aorta: Secondary | ICD-10-CM | POA: Diagnosis not present

## 2024-08-28 DIAGNOSIS — E1122 Type 2 diabetes mellitus with diabetic chronic kidney disease: Secondary | ICD-10-CM | POA: Diagnosis not present

## 2024-08-28 DIAGNOSIS — Z23 Encounter for immunization: Secondary | ICD-10-CM | POA: Diagnosis not present

## 2024-08-28 DIAGNOSIS — Z7984 Long term (current) use of oral hypoglycemic drugs: Secondary | ICD-10-CM

## 2024-08-28 DIAGNOSIS — M21371 Foot drop, right foot: Secondary | ICD-10-CM | POA: Diagnosis not present

## 2024-08-28 DIAGNOSIS — E1165 Type 2 diabetes mellitus with hyperglycemia: Secondary | ICD-10-CM

## 2024-08-28 DIAGNOSIS — N1831 Chronic kidney disease, stage 3a: Secondary | ICD-10-CM | POA: Diagnosis not present

## 2024-08-28 DIAGNOSIS — I771 Stricture of artery: Secondary | ICD-10-CM | POA: Diagnosis not present

## 2024-08-28 LAB — BASIC METABOLIC PANEL WITH GFR
BUN: 18 mg/dL (ref 6–23)
CO2: 27 meq/L (ref 19–32)
Calcium: 9.3 mg/dL (ref 8.4–10.5)
Chloride: 101 meq/L (ref 96–112)
Creatinine, Ser: 1.31 mg/dL (ref 0.40–1.50)
GFR: 48.92 mL/min — ABNORMAL LOW (ref >60.00)
Glucose, Bld: 65 mg/dL — ABNORMAL LOW (ref 70–99)
Potassium: 4.2 meq/L (ref 3.5–5.1)
Sodium: 136 meq/L (ref 135–145)

## 2024-08-28 LAB — CBC WITH DIFFERENTIAL/PLATELET
Basophils Absolute: 0 K/uL (ref 0.0–0.1)
Basophils Relative: 0.6 % (ref 0.0–3.0)
Eosinophils Absolute: 0.2 K/uL (ref 0.0–0.7)
Eosinophils Relative: 2 % (ref 0.0–5.0)
HCT: 38.8 % — ABNORMAL LOW (ref 39.0–52.0)
Hemoglobin: 12.6 g/dL — ABNORMAL LOW (ref 13.0–17.0)
Lymphocytes Relative: 36 % (ref 12.0–46.0)
Lymphs Abs: 2.8 K/uL (ref 0.7–4.0)
MCHC: 32.5 g/dL (ref 30.0–36.0)
MCV: 94 fl (ref 78.0–100.0)
Monocytes Absolute: 0.8 K/uL (ref 0.1–1.0)
Monocytes Relative: 10.2 % (ref 3.0–12.0)
Neutro Abs: 3.9 K/uL (ref 1.4–7.7)
Neutrophils Relative %: 51.2 % (ref 43.0–77.0)
Platelets: 219 K/uL (ref 150.0–400.0)
RBC: 4.13 Mil/uL — ABNORMAL LOW (ref 4.22–5.81)
RDW: 13.5 % (ref 11.5–15.5)
WBC: 7.7 K/uL (ref 4.0–10.5)

## 2024-08-28 LAB — URINALYSIS, ROUTINE W REFLEX MICROSCOPIC
Bilirubin Urine: NEGATIVE
Hgb urine dipstick: NEGATIVE
Ketones, ur: NEGATIVE
Nitrite: POSITIVE — AB
Specific Gravity, Urine: 1.025 (ref 1.000–1.030)
Total Protein, Urine: NEGATIVE
Urine Glucose: NEGATIVE
Urobilinogen, UA: 0.2 (ref 0.0–1.0)
pH: 5.5 (ref 5.0–8.0)

## 2024-08-28 LAB — HEPATIC FUNCTION PANEL
ALT: 10 U/L (ref 0–53)
AST: 16 U/L (ref 0–37)
Albumin: 4 g/dL (ref 3.5–5.2)
Alkaline Phosphatase: 45 U/L (ref 39–117)
Bilirubin, Direct: 0.1 mg/dL (ref 0.0–0.3)
Total Bilirubin: 0.4 mg/dL (ref 0.2–1.2)
Total Protein: 6.9 g/dL (ref 6.0–8.3)

## 2024-08-28 LAB — SEDIMENTATION RATE: Sed Rate: 17 mm/h (ref 0–20)

## 2024-08-28 LAB — HEMOGLOBIN A1C: Hgb A1c MFr Bld: 6.1 % (ref 4.6–6.5)

## 2024-08-28 MED ORDER — TRAMADOL HCL 50 MG PO TABS: 50.0000 mg | ORAL_TABLET | Freq: Four times a day (QID) | ORAL | 0 refills | Status: DC | PRN

## 2024-08-28 MED ORDER — CIPROFLOXACIN HCL 500 MG PO TABS: 500.0000 mg | ORAL_TABLET | Freq: Two times a day (BID) | ORAL | 0 refills | Status: DC
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
## 2024-08-28 NOTE — Patient Instructions (Signed)
 Please take all new medication as prescribed- the tramadol  for pain as needed  Please continue all other medications as before, and refills have been done if requested.  Please have the pharmacy call with any other refills you may need.  Please keep your appointments with your specialists as you may have planned  Please go to the XRAY Department in the first floor for the x-ray testing  Please go to the LAB at the blood drawing area for the tests to be done  You will be contacted by phone if any changes need to be made immediately.  Otherwise, you will receive a letter about your results with an explanation, but please check with MyChart first.  Please make an Appointment to return in 6 months, or sooner if needed

## 2024-08-30 ENCOUNTER — Encounter: Payer: Self-pay | Admitting: Internal Medicine

## 2024-08-30 DIAGNOSIS — M549 Dorsalgia, unspecified: Secondary | ICD-10-CM | POA: Insufficient documentation

## 2024-08-30 NOTE — Assessment & Plan Note (Addendum)
 Lab Results  Component Value Date   HGBA1C 6.1 08/28/2024   Stable, pt to continue current medical treatmen metformin   500 bid  Ckd3a Lab Results  Component Value Date   CREATININE 1.31 08/28/2024   Stable overall, cont to avoid nephrotoxins

## 2024-08-30 NOTE — Assessment & Plan Note (Signed)
Last vitamin D Lab Results  Component Value Date   VD25OH 78.85 07/17/2023   Stable, cont oral replacement

## 2024-08-30 NOTE — Assessment & Plan Note (Signed)
 Etiology unclear, for cxr, tramadol  50 mg prn limited rx, and lab with esr

## 2024-08-30 NOTE — Assessment & Plan Note (Signed)
 No recent falls, but pt has AFO ordered per orthopedic

## 2024-09-02 ENCOUNTER — Ambulatory Visit: Payer: Self-pay

## 2024-09-02 NOTE — Telephone Encounter (Signed)
 Copied from CRM [id number]. Topic: Clinical - Prescription Issue >> Sep 02, 2024  4:40 PM Ashley R wrote: Reason for CRM: stomach upset, nausea reaction for new Rxs traMADol  (ULTRAM ) 50 MG tablet Ciprofloxacin  HCl 500 mg

## 2024-09-02 NOTE — Telephone Encounter (Signed)
 FYI Only or Action Required?: FYI only for provider: appointment scheduled on 10/30.  Patient was last seen in primary care on 08/28/2024 by Norleen Lynwood ORN, MD.  Called Nurse Triage reporting Abdominal Pain.  Symptoms began several days ago.  Interventions attempted: Rest, hydration, or home remedies.  Symptoms are: gradually worsening.  Triage Disposition: No disposition on file.  Patient/caregiver understands and will follow disposition?:   Copied from CRM [id number]. Topic: Clinical - Prescription Issue      Reason for Triage: Reason for CRM: stomach upset, nausea, wanting to throw up, unabel to eat as a reaction for new Rxs: traMADol  (ULTRAM ) 50 MG tablet Ciprofloxacin  HCl 500 mg [id number]    Reason for Disposition  Nausea lasts > 1 week  Answer Assessment - Initial Assessment Questions Friday patient was started on Tramadol  for back pain and Cipro  for UTI sx. Patient states almost immediately after starting the medication he felt nauseous. Ginger beer and ginger chews, and electrolyte drinks are all he has been able to really keep down without wanting to toss it up. Diarrhea a few days before starting the meds- took Imodium . Has not had BM since then. Passing gas. No abdominal pain, fever, CP, SOB. Was fairly regular with BM's prior to imodium .   No GI appt until December 11th. Pt to stop medication tonight to see if nausea lessens and appt made for 10/30. ED/UC precautions given and understood.    1. NAUSEA SEVERITY: How bad is the nausea? (e.g., mild, moderate, severe; dehydration, weight loss)     Cannot eat or gets really nauseas 2. ONSET: When did the nausea begin?     Started 10/24 soon after starting Tramadol  and Cipro . 3. VOMITING: Any vomiting? If Yes, ask: How many times today?     denies 4. RECURRENT SYMPTOM: Have you had nausea before? If Yes, ask: When was the last time? What happened that time?     denies 5. CAUSE: What do you think is  causing the nausea?     New medication  Protocols used: Nausea-A-AH

## 2024-09-03 ENCOUNTER — Ambulatory Visit: Admitting: Internal Medicine

## 2024-09-03 ENCOUNTER — Telehealth: Payer: Self-pay

## 2024-09-03 NOTE — Telephone Encounter (Signed)
 Copied from CRM [id number]. Topic: Clinical - Medication Question >> Sep 03, 2024  8:27 AM Joseph Rocha DEL wrote: Reason for CRM: Patient called in stating that traMADol  (ULTRAM ) 50 MG tablet and  Ciprofloxacin  HCl 500 mg has been upsetting his stomach,and would like to know if provider will prescribe him a different medication.

## 2024-09-03 NOTE — Telephone Encounter (Signed)
 Patient to follow up with us  in office today

## 2024-09-03 NOTE — Telephone Encounter (Signed)
 Copied from CRM #[id number]. Topic: Clinical - Prescription Issue >> Sep 03, 2024  1:16 PM Alfonso HERO wrote: Reason for CRM: patient calling because the medication is making him vomit. He didn't realize his appt was for today so I scheduled him for tmrw but he wants to know if he should stop taking the medication. Please give him a call.

## 2024-09-04 ENCOUNTER — Encounter (HOSPITAL_COMMUNITY): Payer: Self-pay

## 2024-09-04 ENCOUNTER — Inpatient Hospital Stay (HOSPITAL_COMMUNITY)
Admission: EM | Admit: 2024-09-04 | Discharge: 2024-09-08 | DRG: 392 | Disposition: A | Discharge status: Home / Self Care | Attending: Internal Medicine | Admitting: Internal Medicine

## 2024-09-04 ENCOUNTER — Emergency Department (HOSPITAL_COMMUNITY)

## 2024-09-04 ENCOUNTER — Ambulatory Visit: Admitting: Family Medicine

## 2024-09-04 ENCOUNTER — Other Ambulatory Visit: Payer: Self-pay

## 2024-09-04 DIAGNOSIS — K5792 Diverticulitis of intestine, part unspecified, without perforation or abscess without bleeding: Principal | ICD-10-CM | POA: Diagnosis present

## 2024-09-04 DIAGNOSIS — E1122 Type 2 diabetes mellitus with diabetic chronic kidney disease: Secondary | ICD-10-CM | POA: Diagnosis present

## 2024-09-04 DIAGNOSIS — E785 Hyperlipidemia, unspecified: Secondary | ICD-10-CM | POA: Diagnosis present

## 2024-09-04 DIAGNOSIS — R310 Gross hematuria: Secondary | ICD-10-CM | POA: Diagnosis present

## 2024-09-04 DIAGNOSIS — Z7984 Long term (current) use of oral hypoglycemic drugs: Secondary | ICD-10-CM

## 2024-09-04 DIAGNOSIS — E11649 Type 2 diabetes mellitus with hypoglycemia without coma: Secondary | ICD-10-CM | POA: Diagnosis not present

## 2024-09-04 DIAGNOSIS — Z7952 Long term (current) use of systemic steroids: Secondary | ICD-10-CM | POA: Diagnosis not present

## 2024-09-04 DIAGNOSIS — R338 Other retention of urine: Secondary | ICD-10-CM | POA: Diagnosis present

## 2024-09-04 DIAGNOSIS — Z79899 Other long term (current) drug therapy: Secondary | ICD-10-CM

## 2024-09-04 DIAGNOSIS — Z1152 Encounter for screening for COVID-19: Secondary | ICD-10-CM

## 2024-09-04 DIAGNOSIS — E870 Hyperosmolality and hypernatremia: Secondary | ICD-10-CM | POA: Diagnosis not present

## 2024-09-04 DIAGNOSIS — I480 Paroxysmal atrial fibrillation: Secondary | ICD-10-CM | POA: Diagnosis present

## 2024-09-04 DIAGNOSIS — I771 Stricture of artery: Secondary | ICD-10-CM | POA: Diagnosis not present

## 2024-09-04 DIAGNOSIS — R5381 Other malaise: Secondary | ICD-10-CM | POA: Diagnosis present

## 2024-09-04 DIAGNOSIS — Z79890 Hormone replacement therapy (postmenopausal): Secondary | ICD-10-CM | POA: Diagnosis not present

## 2024-09-04 DIAGNOSIS — M21371 Foot drop, right foot: Secondary | ICD-10-CM | POA: Diagnosis present

## 2024-09-04 DIAGNOSIS — K5732 Diverticulitis of large intestine without perforation or abscess without bleeding: Principal | ICD-10-CM | POA: Diagnosis present

## 2024-09-04 DIAGNOSIS — I129 Hypertensive chronic kidney disease with stage 1 through stage 4 chronic kidney disease, or unspecified chronic kidney disease: Secondary | ICD-10-CM | POA: Diagnosis present

## 2024-09-04 DIAGNOSIS — E861 Hypovolemia: Secondary | ICD-10-CM | POA: Diagnosis present

## 2024-09-04 DIAGNOSIS — E871 Hypo-osmolality and hyponatremia: Secondary | ICD-10-CM | POA: Diagnosis not present

## 2024-09-04 DIAGNOSIS — I700 Atherosclerosis of aorta: Secondary | ICD-10-CM | POA: Diagnosis not present

## 2024-09-04 DIAGNOSIS — Z833 Family history of diabetes mellitus: Secondary | ICD-10-CM

## 2024-09-04 DIAGNOSIS — N401 Enlarged prostate with lower urinary tract symptoms: Secondary | ICD-10-CM | POA: Diagnosis present

## 2024-09-04 DIAGNOSIS — Z8249 Family history of ischemic heart disease and other diseases of the circulatory system: Secondary | ICD-10-CM

## 2024-09-04 DIAGNOSIS — K449 Diaphragmatic hernia without obstruction or gangrene: Secondary | ICD-10-CM | POA: Diagnosis not present

## 2024-09-04 DIAGNOSIS — R531 Weakness: Secondary | ICD-10-CM | POA: Diagnosis not present

## 2024-09-04 DIAGNOSIS — N1831 Chronic kidney disease, stage 3a: Secondary | ICD-10-CM | POA: Diagnosis present

## 2024-09-04 DIAGNOSIS — Z888 Allergy status to other drugs, medicaments and biological substances status: Secondary | ICD-10-CM | POA: Diagnosis not present

## 2024-09-04 DIAGNOSIS — M47816 Spondylosis without myelopathy or radiculopathy, lumbar region: Secondary | ICD-10-CM | POA: Diagnosis present

## 2024-09-04 DIAGNOSIS — Z86011 Personal history of benign neoplasm of the brain: Secondary | ICD-10-CM | POA: Diagnosis not present

## 2024-09-04 DIAGNOSIS — Z7982 Long term (current) use of aspirin: Secondary | ICD-10-CM

## 2024-09-04 DIAGNOSIS — R197 Diarrhea, unspecified: Secondary | ICD-10-CM | POA: Diagnosis not present

## 2024-09-04 DIAGNOSIS — E222 Syndrome of inappropriate secretion of antidiuretic hormone: Secondary | ICD-10-CM | POA: Diagnosis present

## 2024-09-04 DIAGNOSIS — K8689 Other specified diseases of pancreas: Secondary | ICD-10-CM | POA: Diagnosis not present

## 2024-09-04 DIAGNOSIS — R112 Nausea with vomiting, unspecified: Secondary | ICD-10-CM | POA: Diagnosis present

## 2024-09-04 DIAGNOSIS — E1165 Type 2 diabetes mellitus with hyperglycemia: Secondary | ICD-10-CM

## 2024-09-04 DIAGNOSIS — R109 Unspecified abdominal pain: Secondary | ICD-10-CM | POA: Diagnosis not present

## 2024-09-04 DIAGNOSIS — M48061 Spinal stenosis, lumbar region without neurogenic claudication: Secondary | ICD-10-CM | POA: Diagnosis present

## 2024-09-04 LAB — COMPREHENSIVE METABOLIC PANEL WITH GFR
ALT: 12 U/L (ref 0–44)
AST: 25 U/L (ref 15–41)
Albumin: 3.9 g/dL (ref 3.5–5.0)
Alkaline Phosphatase: 57 U/L (ref 38–126)
Anion gap: 12 (ref 5–15)
BUN: 14 mg/dL (ref 8–23)
CO2: 23 mmol/L (ref 22–32)
Calcium: 9 mg/dL (ref 8.9–10.3)
Chloride: 95 mmol/L — ABNORMAL LOW (ref 98–111)
Creatinine, Ser: 1.25 mg/dL — ABNORMAL HIGH (ref 0.61–1.24)
GFR, Estimated: 56 mL/min — ABNORMAL LOW (ref >60)
Glucose, Bld: 79 mg/dL (ref 70–99)
Potassium: 4.2 mmol/L (ref 3.5–5.1)
Sodium: 129 mmol/L — ABNORMAL LOW (ref 135–145)
Total Bilirubin: 0.5 mg/dL (ref 0.0–1.2)
Total Protein: 6.7 g/dL (ref 6.5–8.1)

## 2024-09-04 LAB — RESP PANEL BY RT-PCR (RSV, FLU A&B, COVID)  RVPGX2
Influenza A by PCR: NEGATIVE
Influenza B by PCR: NEGATIVE
Resp Syncytial Virus by PCR: NEGATIVE
SARS Coronavirus 2 by RT PCR: NEGATIVE

## 2024-09-04 LAB — CBC WITH DIFFERENTIAL/PLATELET
Abs Immature Granulocytes: 0.04 K/uL (ref 0.00–0.07)
Basophils Absolute: 0 K/uL (ref 0.0–0.1)
Basophils Relative: 0 %
Eosinophils Absolute: 0.1 K/uL (ref 0.0–0.5)
Eosinophils Relative: 2 %
HCT: 35.8 % — ABNORMAL LOW (ref 39.0–52.0)
Hemoglobin: 11.9 g/dL — ABNORMAL LOW (ref 13.0–17.0)
Immature Granulocytes: 1 %
Lymphocytes Relative: 25 %
Lymphs Abs: 1.8 K/uL (ref 0.7–4.0)
MCH: 30.3 pg (ref 26.0–34.0)
MCHC: 33.2 g/dL (ref 30.0–36.0)
MCV: 91.1 fL (ref 80.0–100.0)
Monocytes Absolute: 0.8 K/uL (ref 0.1–1.0)
Monocytes Relative: 11 %
Neutro Abs: 4.5 K/uL (ref 1.7–7.7)
Neutrophils Relative %: 61 %
Platelets: 186 K/uL (ref 150–400)
RBC: 3.93 MIL/uL — ABNORMAL LOW (ref 4.22–5.81)
RDW: 12.9 % (ref 11.5–15.5)
WBC: 7.3 K/uL (ref 4.0–10.5)
nRBC: 0 % (ref 0.0–0.2)

## 2024-09-04 LAB — URINALYSIS, ROUTINE W REFLEX MICROSCOPIC
Bilirubin Urine: NEGATIVE
Glucose, UA: NEGATIVE mg/dL
Hgb urine dipstick: NEGATIVE
Ketones, ur: NEGATIVE mg/dL
Leukocytes,Ua: NEGATIVE
Nitrite: NEGATIVE
Protein, ur: NEGATIVE mg/dL
Specific Gravity, Urine: 1.011 (ref 1.005–1.030)
pH: 6 (ref 5.0–8.0)

## 2024-09-04 LAB — GLUCOSE, CAPILLARY
Glucose-Capillary: 100 mg/dL — ABNORMAL HIGH (ref 70–99)
Glucose-Capillary: 129 mg/dL — ABNORMAL HIGH (ref 70–99)
Glucose-Capillary: 36 mg/dL — CL (ref 70–99)
Glucose-Capillary: 39 mg/dL — CL (ref 70–99)
Glucose-Capillary: 79 mg/dL (ref 70–99)

## 2024-09-04 LAB — CK: Total CK: 197 U/L (ref 49–397)

## 2024-09-04 LAB — TROPONIN T, HIGH SENSITIVITY
Troponin T High Sensitivity: 25 ng/L — ABNORMAL HIGH (ref 0–19)
Troponin T High Sensitivity: 26 ng/L — ABNORMAL HIGH (ref 0–19)

## 2024-09-04 LAB — CBG MONITORING, ED: Glucose-Capillary: 80 mg/dL (ref 70–99)

## 2024-09-04 LAB — LIPASE, BLOOD: Lipase: 21 U/L (ref 11–51)

## 2024-09-04 MED ORDER — FINASTERIDE 5 MG PO TABS
5.0000 mg | ORAL_TABLET | Freq: Every day | ORAL | Status: DC
  Administered 2024-09-04 – 2024-09-08 (×5): 5 mg via ORAL
  Filled 2024-09-04 (×5): qty 1

## 2024-09-04 MED ORDER — PIPERACILLIN-TAZOBACTAM 3.375 G IVPB 30 MIN
3.3750 g | Freq: Once | INTRAVENOUS | Status: AC
  Administered 2024-09-04: 3.375 g via INTRAVENOUS
  Filled 2024-09-04: qty 50

## 2024-09-04 MED ORDER — LEVOTHYROXINE SODIUM 50 MCG PO TABS
50.0000 ug | ORAL_TABLET | Freq: Every day | ORAL | Status: DC
  Administered 2024-09-05 – 2024-09-08 (×4): 50 ug via ORAL
  Filled 2024-09-04 (×4): qty 1

## 2024-09-04 MED ORDER — KETOROLAC TROMETHAMINE 30 MG/ML IJ SOLN
15.0000 mg | Freq: Once | INTRAMUSCULAR | Status: AC
  Administered 2024-09-04: 15 mg via INTRAVENOUS
  Filled 2024-09-04: qty 1

## 2024-09-04 MED ORDER — ONDANSETRON HCL 4 MG PO TABS: 4.0000 mg | ORAL_TABLET | Freq: Four times a day (QID) | ORAL | Status: DC | PRN

## 2024-09-04 MED ORDER — TAMSULOSIN HCL 0.4 MG PO CAPS
0.4000 mg | ORAL_CAPSULE | Freq: Every day | ORAL | Status: DC
  Administered 2024-09-04 – 2024-09-08 (×5): 0.4 mg via ORAL
  Filled 2024-09-04 (×5): qty 1

## 2024-09-04 MED ORDER — LACTATED RINGERS IV BOLUS
1000.0000 mL | Freq: Once | INTRAVENOUS | Status: AC
  Administered 2024-09-04: 1000 mL via INTRAVENOUS

## 2024-09-04 MED ORDER — INSULIN ASPART 100 UNIT/ML IJ SOLN
0.0000 [IU] | INTRAMUSCULAR | Status: DC
  Administered 2024-09-04: 1 [IU] via SUBCUTANEOUS
  Filled 2024-09-04: qty 0.09

## 2024-09-04 MED ORDER — ENOXAPARIN SODIUM 40 MG/0.4ML IJ SOSY
40.0000 mg | PREFILLED_SYRINGE | INTRAMUSCULAR | Status: DC
  Administered 2024-09-04 – 2024-09-07 (×4): 40 mg via SUBCUTANEOUS
  Filled 2024-09-04 (×4): qty 0.4

## 2024-09-04 MED ORDER — ACETAMINOPHEN 325 MG PO TABS
650.0000 mg | ORAL_TABLET | Freq: Four times a day (QID) | ORAL | Status: DC | PRN
  Administered 2024-09-04: 650 mg via ORAL
  Filled 2024-09-04 (×2): qty 2

## 2024-09-04 MED ORDER — SODIUM CHLORIDE 0.9 % IV SOLN: INTRAVENOUS | Status: DC

## 2024-09-04 MED ORDER — ONDANSETRON HCL 4 MG/2ML IJ SOLN
4.0000 mg | Freq: Four times a day (QID) | INTRAMUSCULAR | Status: DC | PRN
  Administered 2024-09-04: 4 mg via INTRAVENOUS
  Filled 2024-09-04: qty 2

## 2024-09-04 MED ORDER — IOHEXOL 300 MG/ML  SOLN
100.0000 mL | Freq: Once | INTRAMUSCULAR | Status: AC | PRN
  Administered 2024-09-04: 100 mL via INTRAVENOUS

## 2024-09-04 MED ORDER — TESTOSTERONE 30 MG/ACT TD SOLN
2.0000 | Freq: Every day | TRANSDERMAL | Status: DC
Start: 2024-09-04 — End: 2024-09-04

## 2024-09-04 MED ORDER — ACETAMINOPHEN 650 MG RE SUPP: 650.0000 mg | Freq: Four times a day (QID) | RECTAL | Status: DC | PRN

## 2024-09-04 MED ORDER — PIPERACILLIN-TAZOBACTAM 3.375 G IVPB
3.3750 g | Freq: Three times a day (TID) | INTRAVENOUS | Status: DC
  Administered 2024-09-04 – 2024-09-08 (×11): 3.375 g via INTRAVENOUS
  Filled 2024-09-04 (×11): qty 50

## 2024-09-04 MED ORDER — FLUDROCORTISONE ACETATE 0.1 MG PO TABS
100.0000 ug | ORAL_TABLET | Freq: Every day | ORAL | Status: DC
  Administered 2024-09-04 – 2024-09-08 (×5): 100 ug via ORAL
  Filled 2024-09-04 (×5): qty 1

## 2024-09-04 MED ORDER — MORPHINE SULFATE (PF) 2 MG/ML IV SOLN
1.0000 mg | INTRAVENOUS | Status: DC | PRN
  Administered 2024-09-07 – 2024-09-08 (×3): 1 mg via INTRAVENOUS
  Filled 2024-09-04 (×3): qty 1

## 2024-09-04 MED ORDER — SODIUM CHLORIDE 0.9% FLUSH
3.0000 mL | Freq: Two times a day (BID) | INTRAVENOUS | Status: DC
  Administered 2024-09-04 – 2024-09-08 (×8): 3 mL via INTRAVENOUS

## 2024-09-04 NOTE — ED Provider Notes (Signed)
 [place] EMERGENCY DEPARTMENT AT [facility] Provider Note   CSN: [id number] Arrival date & time: 09/04/24  0330     Patient presents with: Generalized Body Aches   Joseph Rocha is a 89 y.o. male.   Patient with a history of diabetes, hyperlipidemia, hypertension here with back pain and nausea.  He denies any recent fall or injury.  He saw his PCP on October 24.  He was treated for nonspecific back pain with tramadol  and antibiotics for possible UTI.  He believes the antibiotics made him feel poorly with increased nausea, dry heaving and vomiting over the past 24 hours.  States has been retching and dry heaving but not vomiting much but feels very nauseous.  Has diffuse abdominal pain and low back pain.  Does have a history of previous back surgery but does not have chronic back pain.  Denies any new weakness to his legs.  Does have chronic right foot drop.  No numbness or tingling.  No bowel or bladder incontinence.  No known fever.  No history of IV drug abuse or cancer. States he wanted to call EMS tonight because he was having increased nausea and feeling poorly with back pain and aches all over.  Denies chest pain or shortness of breath.  He was prescribed tramadol  and Cipro  which she has not had for the past 48 hours.  He believes it is causing him to have nausea and body aches and back pain.  He is not having any chest pain or shortness of breath currently.  Denies any pain with urination or blood in the urine. Several episodes of diarrhea also  The history is provided by the patient and the EMS personnel.       Prior to Admission medications   Medication Sig Start Date End Date Taking? Authorizing Provider  aspirin  EC 81 MG tablet Take 1 tablet (81 mg total) by mouth daily. Swallow whole. 07/13/20   Norleen Lynwood ORN, MD  azelastine (ASTELIN) 0.1 % nasal spray 1-2 sprays in each nostril Nasally Twice a day prn breakthrough symptoms; Duration: 30 day(s)    [provider]  calcium  carbonate (OS-CAL) 600 MG TABS Take 600 mg by mouth daily.    [provider]  cetirizine  (ZYRTEC ) 10 MG tablet Take 1 tablet (10 mg total) by mouth daily. 10/15/23 10/14/24  Norleen Lynwood ORN, MD  ciprofloxacin  (CIPRO ) 500 MG tablet Take 1 tablet (500 mg total) by mouth 2 (two) times daily for 10 days. 08/28/24 09/07/24  Norleen Lynwood ORN, MD  cyclobenzaprine  (FLEXERIL ) 5 MG tablet Take 1 tablet (5 mg total) by mouth 3 (three) times daily as needed. 03/18/24   Williams, Megan E, NP  dicyclomine  (BENTYL ) 10 MG capsule Take 1 capsule (10 mg total) by mouth 3 (three) times daily as needed for spasms. 01/23/24   Norleen Lynwood ORN, MD  ferrous sulfate  325 (65 FE) MG EC tablet Take 325 mg by mouth daily. 07/26/22   [provider]  finasteride  (PROSCAR ) 5 MG tablet TAKE 1 TABLET DAILY Patient taking differently: Take 5 mg by mouth daily. 04/04/21   Norleen Lynwood ORN, MD  fludrocortisone  (FLORINEF ) 0.1 MG tablet Take 1 tablet (100 mcg total) by mouth daily. 07/15/24   Norleen Lynwood ORN, MD  Fluocinolone Acetonide 0.01 % OIL SMARTSIG:4 Drop(s) In Ear(s) 1-2 Times Daily PRN    [provider]  fluticasone  (FLONASE ) 50 MCG/ACT nasal spray Place 2 sprays into both nostrils daily. 10/15/18   Rollene,  Almarie LABOR, MD  folic acid  (FOLVITE ) 1 MG tablet TAKE 1 TABLET DAILY 03/31/24   Norleen Lynwood ORN, MD  gabapentin  (NEURONTIN ) 100 MG capsule Take 1 capsule (100 mg total) by mouth 3 (three) times daily. 02/20/24   Norleen Lynwood ORN, MD  HYDROcodone -acetaminophen  (NORCO/VICODIN) 5-325 MG tablet Take 1 tablet by mouth every 6 (six) hours as needed. 02/20/24   Norleen Lynwood ORN, MD  KLOR-CON  M20 20 MEQ tablet Take 20 mEq by mouth daily.  03/15/13   [provider]  levothyroxine  (SYNTHROID ) 50 MCG tablet Take 1 tablet (50 mcg total) by mouth daily. 03/03/24   Norleen Lynwood ORN, MD  loperamide  (IMODIUM  A-D) 2 MG tablet Take 1 tablet (2 mg total) by mouth 2 (two) times daily as needed for diarrhea or  loose stools. 03/25/23   Jerrol Lynwood, MD  Magnesium  200 MG TABS 2 tab by mouth once daily 01/09/24   Norleen Lynwood ORN, MD  meloxicam  (MOBIC ) 7.5 MG tablet Take 1 tablet (7.5 mg total) by mouth daily as needed for pain. 10/15/23   Norleen Lynwood ORN, MD  metFORMIN  (GLUCOPHAGE ) 500 MG tablet Take 1 tablet (500 mg total) by mouth 2 (two) times daily with a meal. 11/22/23   Norleen Lynwood ORN, MD  methocarbamol  (ROBAXIN ) 500 MG tablet 1-2 tab by mouth at bedtime as needed 04/22/24   Norleen Lynwood ORN, MD  Omega-3 Fatty Acids (FISH OIL ) 1000 MG CAPS Take 1 capsule by mouth daily.    [provider]  omeprazole  (PRILOSEC) 40 MG capsule Take 1 capsule (40 mg total) by mouth daily. 02/03/24   Norleen Lynwood ORN, MD  ondansetron  (ZOFRAN -ODT) 4 MG disintegrating tablet Take 1 tablet (4 mg total) by mouth every 8 (eight) hours as needed for nausea or vomiting. 10/28/23   Beather Delon Gibson, PA  predniSONE  (DELTASONE ) 10 MG tablet 3 tabs by mouth per day for 3 days,2tabs per day for 3 days,1tab per day for 3 days 07/22/24   Norleen Lynwood ORN, MD  repaglinide  (PRANDIN ) 2 MG tablet Take 1 tablet (2 mg total) by mouth 2 (two) times daily before a meal. TAKE 1 TABLET TWICE DAILY  BEFORE MEALS 11/22/23   Norleen Lynwood ORN, MD  rosuvastatin  (CRESTOR ) 5 MG tablet TAKE 1 TABLET DAILY Patient taking differently: Take 5 mg by mouth daily. 09/23/23   Norleen Lynwood ORN, MD  solifenacin  (VESICARE ) 5 MG tablet Take 1 tablet (5 mg total) by mouth daily. 09/02/23   Norleen Lynwood ORN, MD  Tamsulosin  HCl (FLOMAX ) 0.4 MG CAPS Take 0.4 mg by mouth daily.    [provider]  Testosterone  30 MG/ACT SOLN Place 2 Pump onto the skin daily.    [provider]  traMADol  (ULTRAM ) 50 MG tablet Take 1 tablet (50 mg total) by mouth every 6 (six) hours as needed. 08/28/24   Norleen Lynwood ORN, MD  triamcinolone  (NASACORT  ALLERGY 24HR) 55 MCG/ACT AERO nasal inhaler 1-2 puff in each nostril Nasally Once a day during seasons of difficulty; Duration: 30 day(s)     [provider]    Allergies: Lipitor [atorvastatin ]    Review of Systems  Constitutional:  Positive for fatigue. Negative for fever.  HENT:  Negative for congestion and rhinorrhea.   Respiratory:  Negative for cough, chest tightness and shortness of breath.   Gastrointestinal:  Positive for abdominal pain, nausea and vomiting.  Genitourinary:  Negative for dysuria and hematuria.  Musculoskeletal:  Positive for arthralgias, back pain and myalgias.  Skin:  Negative  for wound.  Neurological:  Negative for dizziness, weakness and headaches.    all other systems are negative except as noted in the HPI and PMH.   Updated Vital Signs BP 137/82 (BP Location: Right Arm)   Pulse (!) 59   Temp 97.6 F (36.4 C) (Oral)   Resp 18   Ht 6' 3 (1.905 m)   Wt 90 kg   SpO2 100%   BMI 24.80 kg/m   Physical Exam Vitals and nursing note reviewed.  Constitutional:      General: He is not in acute distress.    Appearance: He is well-developed. He is not ill-appearing.  HENT:     Head: Normocephalic and atraumatic.     Mouth/Throat:     Pharynx: No oropharyngeal exudate.  Eyes:     Conjunctiva/sclera: Conjunctivae normal.     Pupils: Pupils are equal, round, and reactive to light.  Neck:     Comments: No meningismus. Cardiovascular:     Rate and Rhythm: Normal rate and regular rhythm.     Heart sounds: Normal heart sounds. No murmur heard. Pulmonary:     Effort: Pulmonary effort is normal. No respiratory distress.     Breath sounds: Normal breath sounds.  Abdominal:     Palpations: Abdomen is soft.     Tenderness: There is no abdominal tenderness. There is no guarding or rebound.  Musculoskeletal:        General: Tenderness present. Normal range of motion.     Cervical back: Normal range of motion and neck supple.     Comments:  Paraspinal lumbar tenderness  Skin:    General: Skin is warm.  Neurological:     Mental Status: He is alert and oriented to person, place, and  time.     Cranial Nerves: No cranial nerve deficit.     Motor: No abnormal muscle tone.     Coordination: Coordination normal.     Comments: Right foot drop chronic.  5/5 strength in lower extremities otherwise.  Intact DP and PT pulses.  +2 patellar reflexes bilaterally.  Psychiatric:        Behavior: Behavior normal.     (all labs ordered are listed, but only abnormal results are displayed) Labs Reviewed  CBC WITH DIFFERENTIAL/PLATELET - Abnormal; Notable for the following components:      Result Value   RBC 3.93 (*)    Hemoglobin 11.9 (*)    HCT 35.8 (*)    All other components within normal limits  COMPREHENSIVE METABOLIC PANEL WITH GFR - Abnormal; Notable for the following components:   Sodium 129 (*)    Chloride 95 (*)    Creatinine, Ser 1.25 (*)    GFR, Estimated 56 (*)    All other components within normal limits  TROPONIN T, HIGH SENSITIVITY - Abnormal; Notable for the following components:   Troponin T High Sensitivity 25 (*)    All other components within normal limits  TROPONIN T, HIGH SENSITIVITY - Abnormal; Notable for the following components:   Troponin T High Sensitivity 26 (*)    All other components within normal limits  RESP PANEL BY RT-PCR (RSV, FLU A&B, COVID)  RVPGX2  CK  URINALYSIS, ROUTINE W REFLEX MICROSCOPIC  LIPASE, BLOOD    EKG: EKG Interpretation Date/Time:  Friday September 04 2024 04:28:50 EDT Ventricular Rate:  65 PR Interval:  171 QRS Duration:  120 QT Interval:  408 QTC Calculation: 428 R Axis:   156  Text Interpretation: Sinus or ectopic  atrial rhythm Nonspecific intraventricular conduction delay Repol abnrm suggests ischemia, diffuse leads Borderline ST elevation, anterolateral leads Artifact in lead(s) I II III aVR aVL aVF V1 V2 V6 Artifact No significant change was found Confirmed by Carita Senior [id number]) on 09/04/2024 4:32:01 AM  Radiology: CT ABDOMEN PELVIS W CONTRAST Result Date: 09/04/2024 EXAM: CT ABDOMEN AND PELVIS WITH  CONTRAST 09/04/2024 06:15:55 AM TECHNIQUE: CT of the abdomen and pelvis was performed with the administration of 100 mL of iohexol  (OMNIPAQUE ) 300 MG/ML solution. Multiplanar reformatted images are provided for review. Automated exposure control, iterative reconstruction, and/or weight-based adjustment of the mA/kV was utilized to reduce the radiation dose to as low as reasonably achievable. COMPARISON: 11/01/2023 and 07/31/2022. CLINICAL HISTORY: Abdominal pain, acute, nonlocalized. Patient was taking antibiotics for urinary tract infection earlier this week but had nausea and vomiting side effects and discontinued the antibiotics 2 days ago. FINDINGS: LOWER CHEST: Small hiatal hernia. Lung bases exhibit mosaicism in the lower lobes consistent with air trapping. No focal pneumonia. The cardiac size is normal. LIVER: The liver is mildly steatotic. There is a 2.6 cm cyst in segment 4b, Hounsfield density is 15, stable. There is no mass enhancement in the liver. GALLBLADDER AND BILE DUCTS: Gallbladder is unremarkable. No biliary ductal dilatation. SPLEEN: No acute abnormality. PANCREAS: There is partial atrophy of the pancreatic head and neck. There is no pancreatic mass enhancement or ductal dilatation. ADRENAL GLANDS: No acute abnormality. KIDNEYS, URETERS AND BLADDER: No stones in the kidneys or ureters. No hydronephrosis. No perinephric or periureteral stranding. Enlarged prostate again measuring nearly 6 cm moderately impresses into the bladder base as before. Numerous pelvic phleboliths. GI AND BOWEL: The gastric wall and unopacified small bowel are unremarkable. The appendix is not seen in this patient. There is fluid in the ascending colon. There is diffuse diverticulosis in the descending and sigmoid segments with inflammatory stranding along the proximal half of the descending colon consistent with acute diverticulitis. There is no pneumatosis, free air, free fluid or free hemorrhage. There is no bowel  obstruction. PERITONEUM AND RETROPERITONEUM: No ascites. No free air. No free fluid or free hemorrhage. VASCULATURE: Aorta is normal in caliber. There are patchy aortic calcific plaques without aneurysm, stenosis, or dissection. LYMPH NODES: No lymphadenopathy. REPRODUCTIVE ORGANS: Enlarged prostate again measuring nearly 6 cm moderately impresses into the bladder base as before. Numerous pelvic phleboliths. BONES AND SOFT TISSUES: Degenerative changes of the spine and both hips, in the lumbar spine with advanced L4-L5 facet hypertrophy with acquired spinal stenosis and grade 1 anterolisthesis. No acute or other significant osseous findings. No focal soft tissue abnormality. IMPRESSION: 1. Acute diverticulitis involving the proximal descending colon without pneumatosis, free air, free fluid, or hemorrhage. 2. lumbar spondylosis with severe L4-L5 facet hypertrophy causing acquired spinal stenosis and grade 1 anterolisthesis. Bilateral hip degenerative changes. Electronically signed by: Francis Quam MD 09/04/2024 07:06 AM EDT RP Workstation: [id number]   DG Chest 2 View Result Date: 09/04/2024 EXAM: 2 VIEW(S) XRAY OF THE CHEST 09/04/2024 05:39:59 AM COMPARISON: PA lat chest 08/28/2024 CLINICAL HISTORY: weakness FINDINGS: LINES, TUBES AND DEVICES: multiple overlying telemetry leads. LUNGS AND PLEURA: No focal pulmonary opacity. No pulmonary edema. No pleural effusion. No pneumothorax. HEART AND MEDIASTINUM: Stable mediastinum with aortic tortuosity and atherosclerosis. BONES AND SOFT TISSUES: moderate thoracic spondylosis with no new osseous findings. IMPRESSION: 1. No acute cardiopulmonary process. Stable chest. Electronically signed by: Francis Quam MD 09/04/2024 06:00 AM EDT RP Workstation: [id number]     Procedures   Medications Ordered  in the ED - No data to display                                  Medical Decision Making Amount and/or Complexity of Data Reviewed Labs: ordered. Decision-making  details documented in ED Course. Radiology: ordered and independent interpretation performed. Decision-making details documented in ED Course. ECG/medicine tests: ordered and independent interpretation performed. Decision-making details documented in ED Course.  Risk Prescription drug management.   Low back pain with nausea and dry heaving.  Recently treated for possible UTI with Cipro  and tramadol  which he has not had for 2 days.  Denies urinary symptoms.  Initial blood pressure was quite high but this was inaccurate per nursing staff.  He denies chest pain or upper back pain.  EKG shows no acute ischemia.  Initial elevated blood pressure was likely spurious per nursing staff.  He denies chest pain or shortness of breath.  Does have some low back pain.  UA without evidence of UTI. Mild hyponatremia 129.  Creatinine at baseline.  Troponin minimally elevated but flat.  Low concern for ACS. Given his abdominal pain and flank pain, CT scan was obtained to evaluate for kidney stone versus other pathology.  No evidence of aortic abdominal aneurysm.  Does have evidence of uncomplicated sigmoid diverticulitis.  Lumbar spinal stenosis with spondylolisthesis.  Low concern for cord compression or cauda equina. Has a chronic right foot drop.  No new weakness, numbness or tingling.  No bowel or bladder incontinence.  Patient continues to feel poorly with nausea and dry heaving and does not feel he can go home. Will plan observation admission for IV antibiotics, symptom control     Final diagnoses:  None    ED Discharge Orders     None          Undra Harriman, Garnette, MD 09/04/24 [phone]

## 2024-09-04 NOTE — ED Notes (Signed)
 RN is aware of pt's BP readings. RN at bedside.

## 2024-09-04 NOTE — ED Notes (Signed)
 Pt assisted with urinal

## 2024-09-04 NOTE — ED Notes (Signed)
 Transported to floor by other

## 2024-09-04 NOTE — ED Triage Notes (Addendum)
 Pt was seen on Monday, dx with UTI, given antibiotics by PCP. Pt reports he had nausea & vomiting from the antibiotics, states PCP told him to d/c the antibiotic which he did 48 hours ago.  Pt reports increase in nausea & vomiting in pass 24 hours he states d/t the antibiotics. Cbg 70 EMS gave 12.5 gram D10 increasing cbg to 130.  of LR, 4mg  zofran  IV via 20g angiocath.  Pt denies pain at time of triage but says his neck & back hurt. Pt is wearing a soft neck collar that he said he placed because his neck gets sore.

## 2024-09-04 NOTE — Plan of Care (Signed)
   Problem: Metabolic: Goal: Ability to maintain appropriate glucose levels will improve Outcome: Progressing   Problem: Nutritional: Goal: Maintenance of adequate nutrition will improve Outcome: Progressing   Problem: Activity: Goal: Risk for activity intolerance will decrease Outcome: Progressing   Problem: Nutrition: Goal: Adequate nutrition will be maintained Outcome: Progressing

## 2024-09-04 NOTE — Care Management Obs Status (Signed)
 MEDICARE OBSERVATION STATUS NOTIFICATION   Patient Details  Name: JONOVAN BOEDECKER MRN: [id number] Date of Birth: Jun 22, 1935   Medicare Observation Status Notification Given:  Yes    Doneta Glenys DASEN, RN 09/04/2024, 3:49 PM

## 2024-09-04 NOTE — Telephone Encounter (Signed)
 I see where this pt is currently in ED with working diagnosis diverticulitis  Ok to hold on med change at this time.   thanks

## 2024-09-04 NOTE — ED Notes (Signed)
 Pt provided ginger ale per fluid challenge order. Pt assisted with urinal.
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
## 2024-09-04 NOTE — ED Notes (Signed)
 Assisted pt from bed to wheelchair to bathroom, and back to bed. Dressed pt out in gown. All needs met.

## 2024-09-04 NOTE — TOC Initial Note (Signed)
 Transition of Care [facility]) - Initial/Assessment Note    Patient Details  Name: Joseph Rocha MRN: [id number] Date of Birth: Sep 05, 1935  [facility]) CM/SW Contact:    Doneta Glenys DASEN, RN Phone Number: 09/04/2024, 3:58 PM  Clinical Narrative:                 MOON. PTA lives in a house with Hauk,Janis (Spouse)[phone]. DME-cane,walker;denies HH, oxygen , and SDOH needs; Wife to transport at discharge. No needs identified during visit. Place consult is needs present.  Expected Discharge Plan: Home/Self Care Barriers to Discharge: Continued Medical Work up, No Barriers Identified   Patient Goals and CMS Choice Patient states their goals for this hospitalization and ongoing recovery are:: Home CMS Medicare.gov Compare Post Acute Care list provided to::  (NA) Choice offered to / list presented to : NA [place] ownership interest in [facility].provided to:: Parent NA    Expected Discharge Plan and Services In-house Referral: NA Discharge Planning Services: CM Consult Post Acute Care Choice: NA Living arrangements for the past 2 months: Single Family Home                 DME Arranged: N/A DME Agency: NA       HH Arranged: NA HH Agency: NA        Prior Living Arrangements/Services Living arrangements for the past 2 months: Single Family Home Lives with:: Spouse Patient language and need for interpreter reviewed:: Yes Do you feel safe going back to the place where you live?: Yes      Need for Family Participation in Patient Care: No (Comment) Care giver support system in place?: Yes (comment) Current home services:  (cane, walker) Criminal Activity/Legal Involvement Pertinent to Current Situation/Hospitalization: No - Comment as needed  Activities of Daily Living   ADL Screening (condition at time of admission) Independently performs ADLs?: Yes (appropriate for developmental age) Is the patient deaf or have difficulty hearing?: No Does  the patient have difficulty seeing, even when wearing glasses/contacts?: No Does the patient have difficulty concentrating, remembering, or making decisions?: No  Permission Sought/Granted Permission sought to share information with : Case Manager Permission granted to share information with : Yes, Verbal Permission Granted  Share Information with NAME: Lorcan, Shelp (Spouse)  [phone]           Emotional Assessment Appearance:: Appears stated age Attitude/Demeanor/Rapport: Engaged Affect (typically observed): Appropriate Orientation: : Oriented to Self, Oriented to Place, Oriented to  Time, Oriented to Situation Alcohol / Substance Use: Not Applicable Psych Involvement: No (comment)  Admission diagnosis:  Diverticulitis [K57.92] Hyponatremia [E87.1] Acute diverticulitis [K57.92] Patient Active Problem List   Diagnosis Date Noted   Acute diverticulitis 09/04/2024   Acute bilateral back pain 08/30/2024   Pedal edema 07/20/2024   Degenerative arthritis of knee, bilateral 07/20/2024   Orthostatic hypotension 07/15/2024   Gait disorder 07/15/2024   Lower urinary tract symptoms due to benign prostatic hyperplasia 04/22/2024   Left lumbar radiculopathy 02/21/2024   Fall 02/21/2024   Benign prostatic hyperplasia with lower urinary tract symptoms 01/23/2024   Bradycardia 01/23/2024   Generalized osteoarthritis 01/23/2024   Other specified postprocedural states 01/23/2024   Secondary adrenal insufficiency 01/23/2024   Type 2 diabetes mellitus with hyperglycemia (HCC) 01/23/2024   Adrenal insufficiency 12/21/2023   Syncope 11/06/2023   Watery diarrhea 11/06/2023   Dark stools 11/05/2023   Upper abdominal pain 11/05/2023   Elevated CK 10/15/2023   Allergic rhinitis 10/15/2023   Epigastric fullness 10/07/2023  Lack of appetite 10/07/2023   Cramps, extremity 10/07/2023   Nausea without vomiting 10/07/2023   Increased CPK level 10/07/2023   Abdominal pain 10/07/2023   OAB  (overactive bladder) 09/06/2023   Right knee pain 07/17/2023   Nocturnal leg cramps 07/17/2023   Primary osteoarthritis of right knee 07/17/2023   Nausea & vomiting 03/29/2023   Hypokalemia 03/29/2023   Wheezing 10/03/2022   Hyperlipidemia associated with type 2 diabetes mellitus (HCC) 09/13/2022   Thoracic back pain 08/21/2022   Posterior neck pain 08/21/2022   Nausea 12/21/2021   Vertigo 03/24/2021   Vitamin D  deficiency 01/16/2021   TMJ arthralgia 02/09/2020   Sinus bradycardia 12/08/2019   Status post placement of implantable loop recorder 12/08/2019   CKD (chronic kidney disease) stage 3, GFR 30-59 ml/min (HCC) 08/01/2018   Rash 05/27/2018   Hyponatremia 05/27/2018   Diarrhea 02/18/2018   Atrial fibrillation with RVR (HCC) 01/05/2018   Hypomagnesemia 01/05/2018   Constipation 12/05/2017   Hypocalcemia 04/29/2017   Low back pain 11/16/2016   Edema 10/03/2016   Dyspnea 10/03/2016   Hypothyroidism 07/26/2016   Numbness 04/06/2016   Weight loss 07/07/2014   Sick sinus syndrome (HCC) 03/02/2014   Nausea with vomiting 03/25/2013   Bilateral leg weakness 07/21/2012   Encounter for long-term (current) use of other medications 02/13/2012   Pituitary abnormality 02/23/2011   BRADYCARDIA 10/17/2009   DIZZINESS 10/17/2009   DISC DISEASE, CERVICAL 12/28/2008   Hypogonadism male 09/01/2008   Inflammatory and toxic neuropathy 09/01/2008   Right foot drop 09/01/2008   PSA, INCREASED 09/01/2008   PITUITARY MACROADENOMA 05/06/2008   SYNCOPE 05/06/2008   Diabetes mellitus with chronic kidney disease (HCC) 05/14/2007   Anemia 05/14/2007   Diverticulosis of colon 05/14/2007   Osteoarthritis 05/14/2007   PCP:  Norleen Lynwood ORN, MD Pharmacy:   [facility] - [place], [place] - [address] [facility] AT Portal to Reg[facility] [address] [place] [address] Phone: [phone] Fax: [phone]  [id number] [place],  [place] - [address] AT [facility] & [address] [place] [address] Phone: [phone] Fax: [phone]  [facility] [address] [place] [address] Phone: [phone] Fax: [phone]     Social Drivers of Health (SDOH) Social History: SDOH Screenings   Food Insecurity: No Food Insecurity (09/04/2024)  Housing: Low Risk  (09/04/2024)  Transportation Needs: No Transportation Needs (09/04/2024)  Utilities: Not At Risk (09/04/2024)  Alcohol Screen: Low Risk  (01/24/2024)  Depression (PHQ2-9): Low Risk  (08/28/2024)  Financial Resource Strain: Low Risk  (01/24/2024)  Physical Activity: Insufficiently Active (01/24/2024)  Social Connections: Moderately Isolated (09/04/2024)  Stress: No Stress Concern Present (01/24/2024)  Tobacco Use: Low Risk  (09/04/2024)  Health Literacy: Adequate Health Literacy (01/24/2024)   SDOH Interventions:     Readmission Risk Interventions    11/08/2023   11:48 AM  Readmission Risk Prevention Plan  Transportation Screening Complete  PCP or Specialist Appt within 5-7 Days Complete  Home Care Screening Complete  Medication Review (RN CM) Complete

## 2024-09-04 NOTE — ED Notes (Signed)
 Patient transported to CT

## 2024-09-05 DIAGNOSIS — I480 Paroxysmal atrial fibrillation: Secondary | ICD-10-CM | POA: Diagnosis present

## 2024-09-05 DIAGNOSIS — K5792 Diverticulitis of intestine, part unspecified, without perforation or abscess without bleeding: Secondary | ICD-10-CM | POA: Diagnosis present

## 2024-09-05 DIAGNOSIS — E222 Syndrome of inappropriate secretion of antidiuretic hormone: Secondary | ICD-10-CM | POA: Diagnosis present

## 2024-09-05 DIAGNOSIS — Z833 Family history of diabetes mellitus: Secondary | ICD-10-CM | POA: Diagnosis not present

## 2024-09-05 DIAGNOSIS — Z79890 Hormone replacement therapy (postmenopausal): Secondary | ICD-10-CM | POA: Diagnosis not present

## 2024-09-05 DIAGNOSIS — Z79899 Other long term (current) drug therapy: Secondary | ICD-10-CM | POA: Diagnosis not present

## 2024-09-05 DIAGNOSIS — Z1152 Encounter for screening for COVID-19: Secondary | ICD-10-CM | POA: Diagnosis not present

## 2024-09-05 DIAGNOSIS — R5381 Other malaise: Secondary | ICD-10-CM | POA: Diagnosis present

## 2024-09-05 DIAGNOSIS — I129 Hypertensive chronic kidney disease with stage 1 through stage 4 chronic kidney disease, or unspecified chronic kidney disease: Secondary | ICD-10-CM | POA: Diagnosis present

## 2024-09-05 DIAGNOSIS — E861 Hypovolemia: Secondary | ICD-10-CM | POA: Diagnosis present

## 2024-09-05 DIAGNOSIS — R112 Nausea with vomiting, unspecified: Secondary | ICD-10-CM | POA: Diagnosis present

## 2024-09-05 DIAGNOSIS — Z7952 Long term (current) use of systemic steroids: Secondary | ICD-10-CM | POA: Diagnosis not present

## 2024-09-05 DIAGNOSIS — Z7984 Long term (current) use of oral hypoglycemic drugs: Secondary | ICD-10-CM | POA: Diagnosis not present

## 2024-09-05 DIAGNOSIS — Z7982 Long term (current) use of aspirin: Secondary | ICD-10-CM | POA: Diagnosis not present

## 2024-09-05 DIAGNOSIS — N401 Enlarged prostate with lower urinary tract symptoms: Secondary | ICD-10-CM | POA: Diagnosis present

## 2024-09-05 DIAGNOSIS — E785 Hyperlipidemia, unspecified: Secondary | ICD-10-CM | POA: Diagnosis present

## 2024-09-05 DIAGNOSIS — E11649 Type 2 diabetes mellitus with hypoglycemia without coma: Secondary | ICD-10-CM | POA: Diagnosis not present

## 2024-09-05 DIAGNOSIS — E1122 Type 2 diabetes mellitus with diabetic chronic kidney disease: Secondary | ICD-10-CM | POA: Diagnosis present

## 2024-09-05 DIAGNOSIS — R338 Other retention of urine: Secondary | ICD-10-CM | POA: Diagnosis present

## 2024-09-05 DIAGNOSIS — N1831 Chronic kidney disease, stage 3a: Secondary | ICD-10-CM | POA: Diagnosis present

## 2024-09-05 DIAGNOSIS — K5732 Diverticulitis of large intestine without perforation or abscess without bleeding: Secondary | ICD-10-CM | POA: Diagnosis present

## 2024-09-05 DIAGNOSIS — Z8249 Family history of ischemic heart disease and other diseases of the circulatory system: Secondary | ICD-10-CM | POA: Diagnosis not present

## 2024-09-05 DIAGNOSIS — Z86011 Personal history of benign neoplasm of the brain: Secondary | ICD-10-CM | POA: Diagnosis not present

## 2024-09-05 DIAGNOSIS — R310 Gross hematuria: Secondary | ICD-10-CM | POA: Diagnosis present

## 2024-09-05 DIAGNOSIS — Z888 Allergy status to other drugs, medicaments and biological substances status: Secondary | ICD-10-CM | POA: Diagnosis not present

## 2024-09-05 LAB — GLUCOSE, CAPILLARY
Glucose-Capillary: 115 mg/dL — ABNORMAL HIGH (ref 70–99)
Glucose-Capillary: 126 mg/dL — ABNORMAL HIGH (ref 70–99)
Glucose-Capillary: 137 mg/dL — ABNORMAL HIGH (ref 70–99)
Glucose-Capillary: 63 mg/dL — ABNORMAL LOW (ref 70–99)
Glucose-Capillary: 68 mg/dL — ABNORMAL LOW (ref 70–99)
Glucose-Capillary: 76 mg/dL (ref 70–99)
Glucose-Capillary: 80 mg/dL (ref 70–99)
Glucose-Capillary: 90 mg/dL (ref 70–99)

## 2024-09-05 LAB — BASIC METABOLIC PANEL WITH GFR
Anion gap: 10 (ref 5–15)
Anion gap: 10 (ref 5–15)
BUN: 7 mg/dL — ABNORMAL LOW (ref 8–23)
BUN: 6 mg/dL — ABNORMAL LOW (ref 8–23)
CO2: 23 mmol/L (ref 22–32)
CO2: 21 mmol/L — ABNORMAL LOW (ref 22–32)
Calcium: 8.4 mg/dL — ABNORMAL LOW (ref 8.9–10.3)
Calcium: 8.5 mg/dL — ABNORMAL LOW (ref 8.9–10.3)
Chloride: 90 mmol/L — ABNORMAL LOW (ref 98–111)
Chloride: 88 mmol/L — ABNORMAL LOW (ref 98–111)
Creatinine, Ser: 1.21 mg/dL (ref 0.61–1.24)
Creatinine, Ser: 1.17 mg/dL (ref 0.61–1.24)
GFR, Estimated: 58 mL/min — ABNORMAL LOW (ref >60)
GFR, Estimated: >60 mL/min (ref >60)
Glucose, Bld: 87 mg/dL (ref 70–99)
Glucose, Bld: 121 mg/dL — ABNORMAL HIGH (ref 70–99)
Potassium: 4 mmol/L (ref 3.5–5.1)
Potassium: 3.8 mmol/L (ref 3.5–5.1)
Sodium: 122 mmol/L — ABNORMAL LOW (ref 135–145)
Sodium: 118 mmol/L — CL (ref 135–145)

## 2024-09-05 LAB — SODIUM, URINE, RANDOM: Sodium, Ur: 130 mmol/L

## 2024-09-05 LAB — CREATININE, URINE, RANDOM: Creatinine, Urine: 68 mg/dL

## 2024-09-05 MED ORDER — SODIUM CHLORIDE 0.9 % IV SOLN
INTRAVENOUS | Status: DC
Start: 2024-09-05 — End: 2024-09-05

## 2024-09-05 MED ORDER — DEXTROSE-SODIUM CHLORIDE 5-0.9 % IV SOLN: INTRAVENOUS | Status: DC

## 2024-09-05 MED ORDER — TOLVAPTAN 15 MG PO TABS
15.0000 mg | ORAL_TABLET | Freq: Once | ORAL | Status: AC
  Administered 2024-09-05: 15 mg via ORAL
  Filled 2024-09-05: qty 1

## 2024-09-05 MED ORDER — DEXTROSE 50 % IV SOLN
25.0000 mL | Freq: Once | INTRAVENOUS | Status: AC
  Administered 2024-09-05: 25 mL via INTRAVENOUS
  Filled 2024-09-05: qty 50

## 2024-09-05 MED ORDER — SODIUM CHLORIDE 1 G PO TABS: 1.0000 g | ORAL_TABLET | Freq: Three times a day (TID) | ORAL | Status: DC
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
## 2024-09-05 NOTE — Progress Notes (Signed)
 PROGRESS NOTE  Joseph Rocha  FMW:[id number] DOB: 11-Jan-1935 DOA: 09/04/2024 PCP: Norleen Lynwood ORN, MD   Brief Narrative: Patient is 89 year old male with history of diabetes type 2, hypertension, CKD stage III, paroxysmal A-fib not on anticoagulation, BPH who presented with generalized body ache, nausea, vomiting. He was recently started on antibiotics and tramadol  for back pain and abdominal cramping. On presentation, he was hemodynamically stable. Lab work showed creatinine of 1.2, sodium of 129. CT abdomen/pelvis showed uncomplicated proximal sigmoid colon diverticulitis. Patient is started on iv antibiotics. Currently managed for acute diverticulitis.  Clinically improving.  Diet advanced to soft today.  Assessment & Plan:  Principal Problem:   Acute diverticulitis  Acute uncomplicated diverticulitis: Was having abdominal cramping, back pain recently.  Outpatient workup was also concerning for UTI.  Was taking Cipro .  Did not tolerate pain medication or antibiotic and presented with nausea and vomiting.  CT findings  as above.  Currently on Zosyn.  He does not complain of any abdominal today.  Abdomen is benign on examination.  Currently on full liquid diet.  Diet advanced to soft  Mild hyponatremia: Unclear etiology.  Patient looks euvolemic.  Sodium dropped to 122 today.  Patient is completely asymptomatic.  Continue normal saline for now at 75.  Will check BMP later today.   Diabetes type 2: Takes metformin  at home.  Became hypoglycemic today.  He does not want to take sliding scale insulin  as well.    CKD stage IIIa: Currently kidney function at baseline   History of BPH: Currently on Flomax , Proscar    History of paroxysmal A-fib: Currently in normal sinus rhythm.  Not on anticoagulation.  Patient denies any history of A-fib.   UTI: He was complaining of dysuria.  UA does not support UTI.  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  Takes testosterone  pump at home  Debility/deconditioning: Patient lives with his wife.  PT consulted         DVT prophylaxis:enoxaparin  (LOVENOX ) injection 40 mg Start: 09/04/24 1000     Code Status: Full Code  Family Communication: Called spouse [name] for update today,call not received  Patient status:Inpatient  Patient is from :home  Anticipated discharge un:ynfz  Estimated DC date:tomorrow   Consultants: None  Procedures:None  Antimicrobials:  Anti-infectives (From admission, onward)    Start     Dose/Rate Route Frequency Ordered Stop   09/04/24 1600  piperacillin-tazobactam (ZOSYN) IVPB 3.375 g        3.375 g 12.5 mL/hr over 240 Minutes Intravenous Every 8 hours 09/04/24 0852     09/04/24 0715  piperacillin-tazobactam (ZOSYN) IVPB 3.375 g        3.375 g 100 mL/hr over 30 Minutes Intravenous  Once 09/04/24 0711 09/04/24 1022       Subjective: Patient seen and examined at bedside today.  Hemodynamically stable.  Very comfortable.  Eating full liquid diet.  Mildly hypoglycemic last night.  Completely alert and oriented.  Denies any abdomen pain, nausea or vomiting.  Very eager to go home.  We discussed about staying 1 more day in the hospital  Objective: Vitals:   09/04/24 1401 09/04/24 2011 09/04/24 2348 09/05/24 0549  BP: 139/87 127/76 110/72 129/70  Pulse: 66 62 64 61  Resp: 16 18 18 16   Temp: 97.8 F (36.6 C) 97.8 F (36.6 C) 97.6 F (36.4 C) 98.1 F (36.7 C)  TempSrc:  Oral Oral  SpO2: 100% 100% 100% 100%  Weight:      Height:        Intake/Output Summary (Last 24 hours) at 09/05/2024 1128 Last data filed at 09/05/2024 1000 Gross per 24 hour  Intake 1155.25 ml  Output 600 ml  Net 555.25 ml   Filed Weights   09/04/24 0345  Weight: 90 kg    Examination:  General exam: Overall comfortable, not in distress,very pleasant elderly gentleman HEENT: PERRL Respiratory  system:  no wheezes or crackles  Cardiovascular system: S1 & S2 heard, RRR.  Gastrointestinal system: Abdomen is nondistended, soft and nontender. Central nervous system: Alert and oriented Extremities: No edema, no clubbing ,no cyanosis Skin: No rashes, no ulcers,no icterus     Data Reviewed: I have personally reviewed following labs and imaging studies  CBC: Recent Labs  Lab 09/04/24 0422  WBC 7.3  NEUTROABS 4.5  HGB 11.9*  HCT 35.8*  MCV 91.1  PLT 186   Basic Metabolic Panel: Recent Labs  Lab 09/04/24 0422 09/05/24 0459  NA 129* 122*  K 4.2 4.0  CL 95* 90*  CO2 23 23  GLUCOSE 79 87  BUN 14 7*  CREATININE 1.25* 1.21  CALCIUM  9.0 8.4*     Recent Results (from the past 240 hours)  Resp panel by RT-PCR (RSV, Flu A&B, Covid) Anterior Nasal Swab     Status: None   Collection Time: 09/04/24  4:22 AM   Specimen: Anterior Nasal Swab  Result Value Ref Range Status   SARS Coronavirus 2 by RT PCR NEGATIVE NEGATIVE Final    Comment: (NOTE) SARS-CoV-2 target nucleic acids are NOT DETECTED.  The SARS-CoV-2 RNA is generally detectable in upper respiratory specimens during the acute phase of infection. The lowest concentration of SARS-CoV-2 viral copies this assay can detect is 138 copies/mL. A negative result does not preclude SARS-Cov-2 infection and should not be used as the sole basis for treatment or other patient management decisions. A negative result may occur with  improper specimen collection/handling, submission of specimen other than nasopharyngeal swab, presence of viral mutation(s) within the areas targeted by this assay, and inadequate number of viral copies(<138 copies/mL). A negative result must be combined with clinical observations, patient history, and epidemiological information. The expected result is Negative.  Fact Sheet for Patients:  bloggercourse.com  Fact Sheet for Healthcare Providers:   seriousbroker.it  This test is no t yet approved or cleared by the [place]  FDA and  has been authorized for detection and/or diagnosis of SARS-CoV-2 by FDA under an Emergency Use Authorization (EUA). This EUA will remain  in effect (meaning this test can be used) for the duration of the COVID-19 declaration under Section 564(b)(1) of the Act, 21 U.S.C.section 360bbb-3(b)(1), unless the authorization is terminated  or revoked sooner.       Influenza A by PCR NEGATIVE NEGATIVE Final   Influenza B by PCR NEGATIVE NEGATIVE Final    Comment: (NOTE) The Xpert Xpress SARS-CoV-2/FLU/RSV plus assay is intended as an aid in the diagnosis of influenza from Nasopharyngeal swab specimens and should not be used as a sole basis for treatment. Nasal washings and aspirates are unacceptable for Xpert Xpress SARS-CoV-2/FLU/RSV testing.  Fact Sheet for Patients: bloggercourse.com  Fact Sheet for Healthcare Providers: seriousbroker.it  This test is not yet approved or cleared by the [place]  FDA and has been authorized for detection and/or diagnosis of SARS-CoV-2 by FDA under an Emergency Use Authorization (EUA). This EUA will remain in effect (meaning this  test can be used) for the duration of the COVID-19 declaration under Section 564(b)(1) of the Act, 21 U.S.C. section 360bbb-3(b)(1), unless the authorization is terminated or revoked.     Resp Syncytial Virus by PCR NEGATIVE NEGATIVE Final    Comment: (NOTE) Fact Sheet for Patients: bloggercourse.com  Fact Sheet for Healthcare Providers: seriousbroker.it  This test is not yet approved or cleared by the [place]  FDA and has been authorized for detection and/or diagnosis of SARS-CoV-2 by FDA under an Emergency Use Authorization (EUA). This EUA will remain in effect (meaning this test can be used) for  the duration of the COVID-19 declaration under Section 564(b)(1) of the Act, 21 U.S.C. section 360bbb-3(b)(1), unless the authorization is terminated or revoked.  Performed at [facility], [address] [place], [place] [address]      Radiology Studies: CT ABDOMEN PELVIS W CONTRAST Result Date: 09/04/2024 EXAM: CT ABDOMEN AND PELVIS WITH CONTRAST 09/04/2024 06:15:55 AM TECHNIQUE: CT of the abdomen and pelvis was performed with the administration of 100 mL of iohexol  (OMNIPAQUE ) 300 MG/ML solution. Multiplanar reformatted images are provided for review. Automated exposure control, iterative reconstruction, and/or weight-based adjustment of the mA/kV was utilized to reduce the radiation dose to as low as reasonably achievable. COMPARISON: 11/01/2023 and 07/31/2022. CLINICAL HISTORY: Abdominal pain, acute, nonlocalized. Patient was taking antibiotics for urinary tract infection earlier this week but had nausea and vomiting side effects and discontinued the antibiotics 2 days ago. FINDINGS: LOWER CHEST: Small hiatal hernia. Lung bases exhibit mosaicism in the lower lobes consistent with air trapping. No focal pneumonia. The cardiac size is normal. LIVER: The liver is mildly steatotic. There is a 2.6 cm cyst in segment 4b, Hounsfield density is 15, stable. There is no mass enhancement in the liver. GALLBLADDER AND BILE DUCTS: Gallbladder is unremarkable. No biliary ductal dilatation. SPLEEN: No acute abnormality. PANCREAS: There is partial atrophy of the pancreatic head and neck. There is no pancreatic mass enhancement or ductal dilatation. ADRENAL GLANDS: No acute abnormality. KIDNEYS, URETERS AND BLADDER: No stones in the kidneys or ureters. No hydronephrosis. No perinephric or periureteral stranding. Enlarged prostate again measuring nearly 6 cm moderately impresses into the bladder base as before. Numerous pelvic phleboliths. GI AND BOWEL: The gastric wall and unopacified small  bowel are unremarkable. The appendix is not seen in this patient. There is fluid in the ascending colon. There is diffuse diverticulosis in the descending and sigmoid segments with inflammatory stranding along the proximal half of the descending colon consistent with acute diverticulitis. There is no pneumatosis, free air, free fluid or free hemorrhage. There is no bowel obstruction. PERITONEUM AND RETROPERITONEUM: No ascites. No free air. No free fluid or free hemorrhage. VASCULATURE: Aorta is normal in caliber. There are patchy aortic calcific plaques without aneurysm, stenosis, or dissection. LYMPH NODES: No lymphadenopathy. REPRODUCTIVE ORGANS: Enlarged prostate again measuring nearly 6 cm moderately impresses into the bladder base as before. Numerous pelvic phleboliths. BONES AND SOFT TISSUES: Degenerative changes of the spine and both hips, in the lumbar spine with advanced L4-L5 facet hypertrophy with acquired spinal stenosis and grade 1 anterolisthesis. No acute or other significant osseous findings. No focal soft tissue abnormality. IMPRESSION: 1. Acute diverticulitis involving the proximal descending colon without pneumatosis, free air, free fluid, or hemorrhage. 2. lumbar spondylosis with severe L4-L5 facet hypertrophy causing acquired spinal stenosis and grade 1 anterolisthesis. Bilateral hip degenerative changes. Electronically signed by: Francis Quam MD 09/04/2024 07:06 AM EDT RP Workstation: [id number]   DG  Chest 2 View Result Date: 09/04/2024 EXAM: 2 VIEW(S) XRAY OF THE CHEST 09/04/2024 05:39:59 AM COMPARISON: PA lat chest 08/28/2024 CLINICAL HISTORY: weakness FINDINGS: LINES, TUBES AND DEVICES: multiple overlying telemetry leads. LUNGS AND PLEURA: No focal pulmonary opacity. No pulmonary edema. No pleural effusion. No pneumothorax. HEART AND MEDIASTINUM: Stable mediastinum with aortic tortuosity and atherosclerosis. BONES AND SOFT TISSUES: moderate thoracic spondylosis with no new osseous  findings. IMPRESSION: 1. No acute cardiopulmonary process. Stable chest. Electronically signed by: Francis Quam MD 09/04/2024 06:00 AM EDT RP Workstation: [id number]    Scheduled Meds:  enoxaparin  (LOVENOX ) injection  40 mg Subcutaneous Q24H   finasteride   5 mg Oral Daily   fludrocortisone   100 mcg Oral Daily   insulin  aspart  0-9 Units Subcutaneous Q4H   levothyroxine   50 mcg Oral Daily   sodium chloride  flush  3 mL Intravenous Q12H   tamsulosin   0.4 mg Oral Daily   Continuous Infusions:  sodium chloride  75 mL/hr at 09/05/24 0822   piperacillin-tazobactam (ZOSYN)  IV 3.375 g (09/05/24 0821)     LOS: 0 days   Ivonne Mustache, MD [facility] P11/11/2023, 11:28 AM

## 2024-09-05 NOTE — Progress Notes (Signed)
 Hypoglycemic Event  CBG: 39  Treatment: 8 oz juice/soda  Symptoms: None  Follow-up CBG: Time: 0011 CBG Result: 63  Possible Reasons for Event: Inadequate meal intake  Comments/MD notified: Lavanda Horns, NP

## 2024-09-06 DIAGNOSIS — K5792 Diverticulitis of intestine, part unspecified, without perforation or abscess without bleeding: Secondary | ICD-10-CM | POA: Diagnosis not present

## 2024-09-06 LAB — GLUCOSE, CAPILLARY
Glucose-Capillary: 135 mg/dL — ABNORMAL HIGH (ref 70–99)
Glucose-Capillary: 145 mg/dL — ABNORMAL HIGH (ref 70–99)
Glucose-Capillary: 171 mg/dL — ABNORMAL HIGH (ref 70–99)
Glucose-Capillary: 88 mg/dL (ref 70–99)
Glucose-Capillary: 94 mg/dL (ref 70–99)
Glucose-Capillary: 95 mg/dL (ref 70–99)

## 2024-09-06 LAB — BASIC METABOLIC PANEL WITH GFR
Anion gap: 10 (ref 5–15)
BUN: 7 mg/dL — ABNORMAL LOW (ref 8–23)
CO2: 23 mmol/L (ref 22–32)
Calcium: 9 mg/dL (ref 8.9–10.3)
Chloride: 90 mmol/L — ABNORMAL LOW (ref 98–111)
Creatinine, Ser: 1.17 mg/dL (ref 0.61–1.24)
GFR, Estimated: >60 mL/min (ref >60)
Glucose, Bld: 99 mg/dL (ref 70–99)
Potassium: 3.7 mmol/L (ref 3.5–5.1)
Sodium: 122 mmol/L — ABNORMAL LOW (ref 135–145)

## 2024-09-06 LAB — SODIUM
Sodium: 117 mmol/L — CL (ref 135–145)
Sodium: 125 mmol/L — ABNORMAL LOW (ref 135–145)
Sodium: 128 mmol/L — ABNORMAL LOW (ref 135–145)
Sodium: 130 mmol/L — ABNORMAL LOW (ref 135–145)

## 2024-09-06 LAB — OSMOLALITY, URINE: Osmolality, Ur: 419 mosm/kg (ref 300–900)

## 2024-09-06 LAB — TSH: TSH: 5.65 u[IU]/mL — ABNORMAL HIGH (ref 0.350–4.500)

## 2024-09-06 LAB — CORTISOL-AM, BLOOD: Cortisol - AM: 3.6 ug/dL — ABNORMAL LOW (ref 6.7–22.6)

## 2024-09-06 MED ORDER — COSYNTROPIN 0.25 MG IJ SOLR
0.2500 mg | Freq: Once | INTRAMUSCULAR | Status: DC
  Filled 2024-09-06: qty 0.25

## 2024-09-06 MED ORDER — SODIUM CHLORIDE 1 G PO TABS
2.0000 g | ORAL_TABLET | Freq: Three times a day (TID) | ORAL | Status: AC
  Administered 2024-09-06 – 2024-09-07 (×3): 2 g via ORAL
  Filled 2024-09-06 (×3): qty 2

## 2024-09-06 NOTE — Plan of Care (Signed)
   Problem: Education: Goal: Ability to describe self-care measures that may prevent or decrease complications (Diabetes Survival Skills Education) will improve Outcome: Progressing Goal: Individualized Educational Video(s) Outcome: Progressing

## 2024-09-06 NOTE — Progress Notes (Signed)
 PROGRESS NOTE  Joseph Rocha  FMW:[id number] DOB: September 05, 1935 DOA: 09/04/2024 PCP: Joseph Rocha   Brief Narrative: Patient is 89 year old male with history of diabetes type 2, hypertension, CKD stage III, paroxysmal A-fib not on anticoagulation, BPH who presented with generalized body ache, nausea, vomiting. He was recently started on antibiotics and tramadol  for back pain and abdominal cramping. On presentation, he was hemodynamically stable. Lab work showed creatinine of 1.2, sodium of 129. CT abdomen/pelvis showed uncomplicated proximal sigmoid colon diverticulitis. Patient is started on iv antibiotics.Admitted for the management of acute diverticulitis.  Clinically improving.  Hospital course remarkable for hyponatremia, likely SIADH.  Nephrology following.  Assessment & Plan:  Principal Problem:   Acute diverticulitis  Acute uncomplicated diverticulitis: Was having abdominal cramping, back pain recently.  Outpatient workup was also concerning for UTI.  Was taking Cipro .  Did not tolerate pain medication or antibiotic and presented with nausea and vomiting.  CT findings  as above.  Currently on Zosyn.  He does not complain of any abdominal today.  Abdomen is benign on examination.  Currently on  soft diet  Hyponatremia: Patient looks euvolemic.  Sodium dropped to 118, nephrology consulted.  Given a dose of tolvaptan on 11/1.  Last sodium level of 125.SABRA  Plan to start on salt tablets and fluid restriction.  Patient is completely asymptomatic.  AM cortisol low.  Plan for ACTH stimulation test tomorrow if patient agrees to stay.   Diabetes type 2: Takes metformin  at home. He does not want to take sliding scale insulin   CKD stage IIIa: Currently kidney function at baseline   History of BPH: Currently on Flomax , Proscar    History of paroxysmal A-fib: Currently in normal sinus rhythm.  Not on anticoagulation.  Patient denies any history of A-fib.   UTI: He was complaining of dysuria.  UA  does not support UTI.  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  Takes testosterone  pump at home.  Follow-up with endocrinology.  A.m. cortisol low.  Continue Florinef  at current dose.  Plan for ACTH stimulation test tomorrow  Debility/deconditioning: Patient lives with his wife.  PT consulted         DVT prophylaxis:enoxaparin  (LOVENOX ) injection 40 mg Start: 09/04/24 1000     Code Status: Full Code  Family Communication: Discussed with  spouse Joseph Rocha at bedside on 11/2  Patient status:Inpatient  Patient is from :home  Anticipated discharge un:ynfz  Estimated DC date:tomorrow   Consultants: Nephrology  Procedures:None  Antimicrobials:  Anti-infectives (From admission, onward)    Start     Dose/Rate Route Frequency Ordered Stop   09/04/24 1600  piperacillin-tazobactam (ZOSYN) IVPB 3.375 g        3.375 g 12.5 mL/hr over 240 Minutes Intravenous Every 8 hours 09/04/24 0852     09/04/24 0715  piperacillin-tazobactam (ZOSYN) IVPB 3.375 g        3.375 g 100 mL/hr over 30 Minutes Intravenous  Once 09/04/24 0711 09/04/24 1022       Subjective: Patient seen and examined at bedside today.  Hemodynamically stable.  Very comfortable.  Sitting on the bed.  Wife at bedside.  He really wants to go home.  We discussed about importance of staying 1 more day at least to make sure that sodium level is better before discharge. Denies abdomen pain, nausea vomiting.  Tolerating soft diet  Objective: Vitals:   09/05/24 2024 09/05/24 2031 09/06/24 0430 09/06/24  0500  BP:  116/69 (!) 119/90   Pulse:  61 65   Resp:  18 18   Temp:  98.3 F (36.8 C) 98.1 F (36.7 C)   TempSrc:      SpO2:  100% 99%   Weight: 87.4 kg   87.4 kg  Height:        Intake/Output Summary (Last 24 hours) at 09/06/2024 1136 Last data filed at 09/06/2024 0935 Gross per 24 hour  Intake  200 ml  Output 1500 ml  Net -1300 ml   Filed Weights   09/04/24 0345 09/05/24 2024 09/06/24 0500  Weight: 90 kg 87.4 kg 87.4 kg    Examination:  General exam: Overall comfortable, not in distress,pleasant elderly male HEENT: PERRL Respiratory system:  no wheezes or crackles  Cardiovascular system: S1 & S2 heard, RRR.  Gastrointestinal system: Abdomen is nondistended, soft and nontender. Central nervous system: Alert and oriented Extremities: No edema, no clubbing ,no cyanosis Skin: No rashes, no ulcers,no icterus      Data Reviewed: I have personally reviewed following labs and imaging studies  CBC: Recent Labs  Lab 09/04/24 0422  WBC 7.3  NEUTROABS 4.5  HGB 11.9*  HCT 35.8*  MCV 91.1  PLT 186   Basic Metabolic Panel: Recent Labs  Lab 09/04/24 0422 09/05/24 0459 09/05/24 1558 09/05/24 2224 09/06/24 0423 09/06/24 0806  NA 129* 122* 118* 117* 122* 125*  K 4.2 4.0 3.8  --  3.7  --   CL 95* 90* 88*  --  90*  --   CO2 23 23 21*  --  23  --   GLUCOSE 79 87 121*  --  99  --   BUN 14 7* 6*  --  7*  --   CREATININE 1.25* 1.21 1.17  --  1.17  --   CALCIUM  9.0 8.4* 8.5*  --  9.0  --      Recent Results (from the past 240 hours)  Resp panel by RT-PCR (RSV, Flu A&B, Covid) Anterior Nasal Swab     Status: None   Collection Time: 09/04/24  4:22 AM   Specimen: Anterior Nasal Swab  Result Value Ref Range Status   SARS Coronavirus 2 by RT PCR NEGATIVE NEGATIVE Final    Comment: (NOTE) SARS-CoV-2 target nucleic acids are NOT DETECTED.  The SARS-CoV-2 RNA is generally detectable in upper respiratory specimens during the acute phase of infection. The lowest concentration of SARS-CoV-2 viral copies this assay can detect is 138 copies/mL. A negative result does not preclude SARS-Cov-2 infection and should not be used as the sole basis for treatment or other patient management decisions. A negative result may occur with  improper specimen collection/handling, submission  of specimen other than nasopharyngeal swab, presence of viral mutation(s) within the areas targeted by this assay, and inadequate number of viral copies(<138 copies/mL). A negative result must be combined with clinical observations, patient history, and epidemiological information. The expected result is Negative.  Fact Sheet for Patients:  bloggercourse.com  Fact Sheet for Healthcare Providers:  seriousbroker.it  This test is no t yet approved or cleared by the [place]  FDA and  has been authorized for detection and/or diagnosis of SARS-CoV-2 by FDA under an Emergency Use Authorization (EUA). This EUA will remain  in effect (meaning this test can be used) for the duration of the COVID-19 declaration under Section 564(b)(1) of the Act, 21 U.S.C.section 360bbb-3(b)(1), unless the authorization is terminated  or revoked sooner.  Influenza A by PCR NEGATIVE NEGATIVE Final   Influenza B by PCR NEGATIVE NEGATIVE Final    Comment: (NOTE) The Xpert Xpress SARS-CoV-2/FLU/RSV plus assay is intended as an aid in the diagnosis of influenza from Nasopharyngeal swab specimens and should not be used as a sole basis for treatment. Nasal washings and aspirates are unacceptable for Xpert Xpress SARS-CoV-2/FLU/RSV testing.  Fact Sheet for Patients: bloggercourse.com  Fact Sheet for Healthcare Providers: seriousbroker.it  This test is not yet approved or cleared by the [place]  FDA and has been authorized for detection and/or diagnosis of SARS-CoV-2 by FDA under an Emergency Use Authorization (EUA). This EUA will remain in effect (meaning this test can be used) for the duration of the COVID-19 declaration under Section 564(b)(1) of the Act, 21 U.S.C. section 360bbb-3(b)(1), unless the authorization is terminated or revoked.     Resp Syncytial Virus by PCR NEGATIVE NEGATIVE  Final    Comment: (NOTE) Fact Sheet for Patients: bloggercourse.com  Fact Sheet for Healthcare Providers: seriousbroker.it  This test is not yet approved or cleared by the [place]  FDA and has been authorized for detection and/or diagnosis of SARS-CoV-2 by FDA under an Emergency Use Authorization (EUA). This EUA will remain in effect (meaning this test can be used) for the duration of the COVID-19 declaration under Section 564(b)(1) of the Act, 21 U.S.C. section 360bbb-3(b)(1), unless the authorization is terminated or revoked.  Performed at [facility], [address], [place], [place] [address]      Radiology Studies: No results found.   Scheduled Meds:  [START ON 09/07/2024] cosyntropin   0.25 mg Intravenous Once   enoxaparin  (LOVENOX ) injection  40 mg Subcutaneous Q24H   finasteride   5 mg Oral Daily   fludrocortisone   100 mcg Oral Daily   levothyroxine   50 mcg Oral Daily   sodium chloride  flush  3 mL Intravenous Q12H   sodium chloride   2 g Oral TID WC   tamsulosin   0.4 mg Oral Daily   Continuous Infusions:  piperacillin-tazobactam (ZOSYN)  IV 3.375 g (09/06/24 0804)     LOS: 1 day   Ivonne Mustache, Rocha [facility] P11/12/2023, 11:36 AM

## 2024-09-06 NOTE — Progress Notes (Addendum)
 [facility] Progress Note  Subjective:  Got tolvaptan at 10 pm Na+ 122 this am No c/o's Am cortisol was low at 3.5  Vitals:   09/05/24 2024 09/05/24 2031 09/06/24 0430 09/06/24 0500  BP:  116/69 (!) 119/90   Pulse:  61 65   Resp:  18 18   Temp:  98.3 F (36.8 C) 98.1 F (36.7 C)   TempSrc:      SpO2:  100% 99%   Weight: 87.4 kg   87.4 kg  Height:        Exam: Gen alert, no distress No jvd or bruits Chest clear bilat to bases RRR no MRG Abd soft ntnd no mass or ascites +bs Ext no LE edema Neuro is alert, Ox 3 , nf   Home meds: Florinef  0.1mg  qd Klor-con  20meq daily Others: asa, proscar , neurontin , norco, mobic  prn, metformin , robaxin  prn, prilosec, prednisone  short course, crestor , flomax , testosterone  every day, ultram  prn   BP: pt admit 10/31 w/ BP 142/84, today is down slightly at 110-135/ 70-85 UA: negative on 10/31, no LE/ Hb, protein negative UNa, UOsm pend CXR 10/31: no active disease CT abd/ pelvis +contrast on 09/04/24: IMPRESSION: Acute diverticulitis involving the proximal descending colon without pneumatosis, free air, free fluid, or hemorrhage. Lumbar spondylosis with severe L4-L5 facet hypertrophy causing acquired spinal stenosis and grade 1 anterolisthesis. Bilateral hip degenerative changes.KIDNEYS, URETERS AND BLADDER: No stones in the kidneys or ureters. No hydronephrosis. No perinephric or periureteral stranding. Am cortisol- low at 3.6 (6-22)    Assessment/ Plan: Hyponatremia: in euvolemic patient admitted for acute diverticulitis. Na was 129 on admit, then dropped to 122 and 118 yesterday while getting isotonic IVF's. IVFs were stopped. No exam/ labs findings of cirrhosis, CHF or renal failure. UNa is normal which is c/w SIADH. Also poor response to NS also points to [facility]. Possible causes are abd pain and adrenal insuffuciency. Norco has been associated also w/ hyponatremia. Pt rec'd tolvaptan last night. Na+ at 4 am today was up to  122. Plan is to start salt tabs+  fluid restriction when tolvaptan effect has worn off, around noon today. Have d/w pmd about w/u for adrenal insufficiency (already sees an endocrinologist., taking fluorinef). ACTH stim test is pending for tomorrow. Cont Na+ draws q 4-6 hrs. Will follow.  Acute diverticulitis: getting IV zosyn. Abd pain much less, appetite improving.       Myer Fret MD  [facility] 09/06/2024, 8:10 AM  Recent Labs  Lab 09/04/24 0422 09/05/24 0459 09/05/24 1558 09/06/24 0423  HGB 11.9*  --   --   --   ALBUMIN 3.9  --   --   --   CALCIUM  9.0   < > 8.5* 9.0  CREATININE 1.25*   < > 1.17 1.17  K 4.2   < > 3.8 3.7   < > = values in this interval not displayed.   No results for input(s): IRON, TIBC, FERRITIN in the last 168 hours. Inpatient medications:  enoxaparin  (LOVENOX ) injection  40 mg Subcutaneous Q24H   finasteride   5 mg Oral Daily   fludrocortisone   100 mcg Oral Daily   levothyroxine   50 mcg Oral Daily   sodium chloride  flush  3 mL Intravenous Q12H   tamsulosin   0.4 mg Oral Daily    piperacillin-tazobactam (ZOSYN)  IV 3.375 g (09/06/24 0804)   acetaminophen  **OR** acetaminophen , morphine injection, ondansetron  **OR** ondansetron  (ZOFRAN ) IV

## 2024-09-06 NOTE — Plan of Care (Signed)
   Problem: Education: Goal: Ability to describe self-care measures that may prevent or decrease complications (Diabetes Survival Skills Education) will improve Outcome: Progressing

## 2024-09-06 NOTE — Plan of Care (Signed)
  Problem: Education: Goal: Ability to describe self-care measures that may prevent or decrease complications (Diabetes Survival Skills Education) will improve 09/06/2024 1831 by Melvyn Melvyn NOVAK, RN Outcome: Progressing 09/06/2024 1830 by Melvyn Melvyn NOVAK, RN Outcome: Progressing Goal: Individualized Educational Video(s) 09/06/2024 1831 by Melvyn Melvyn NOVAK, RN Outcome: Progressing 09/06/2024 1830 by Melvyn Melvyn NOVAK, RN Outcome: Progressing   Problem: Education: Goal: Individualized Educational Video(s) 09/06/2024 1831 by Melvyn Melvyn NOVAK, RN Outcome: Progressing 09/06/2024 1830 by Melvyn Melvyn NOVAK, RN Outcome: Progressing   Problem: Coping: Goal: Ability to adjust to condition or change in health will improve Outcome: Progressing

## 2024-09-06 NOTE — Progress Notes (Signed)
 PT Cancellation Note  Patient Details Name: HUDSYN CHAMPINE MRN: [id number] DOB: Dec 06, 1934   Cancelled Treatment:    Reason Eval/Treat Not Completed:  Attempted PT eval-pt on phone call-did not acknowledge therapist/wish to end call. Will check back another day/time when pt is available.    Dannial SQUIBB, PT Acute Rehabilitation  Office: [phone]

## 2024-09-06 NOTE — Progress Notes (Signed)
 Call received from lab that patient sodium is 117. Np notified. Patient seen by Nephrologist this evening. Patient alert and verbally responsive in stable condition.SABRA

## 2024-09-07 DIAGNOSIS — K5792 Diverticulitis of intestine, part unspecified, without perforation or abscess without bleeding: Secondary | ICD-10-CM | POA: Diagnosis not present

## 2024-09-07 LAB — GLUCOSE, CAPILLARY
Glucose-Capillary: 104 mg/dL — ABNORMAL HIGH (ref 70–99)
Glucose-Capillary: 137 mg/dL — ABNORMAL HIGH (ref 70–99)
Glucose-Capillary: 139 mg/dL — ABNORMAL HIGH (ref 70–99)
Glucose-Capillary: 145 mg/dL — ABNORMAL HIGH (ref 70–99)
Glucose-Capillary: 169 mg/dL — ABNORMAL HIGH (ref 70–99)
Glucose-Capillary: 222 mg/dL — ABNORMAL HIGH (ref 70–99)
Glucose-Capillary: 90 mg/dL (ref 70–99)

## 2024-09-07 LAB — BASIC METABOLIC PANEL WITH GFR
Anion gap: 10 (ref 5–15)
BUN: 13 mg/dL (ref 8–23)
CO2: 21 mmol/L — ABNORMAL LOW (ref 22–32)
Calcium: 9.1 mg/dL (ref 8.9–10.3)
Chloride: 101 mmol/L (ref 98–111)
Creatinine, Ser: 1.35 mg/dL — ABNORMAL HIGH (ref 0.61–1.24)
GFR, Estimated: 51 mL/min — ABNORMAL LOW (ref >60)
Glucose, Bld: 121 mg/dL — ABNORMAL HIGH (ref 70–99)
Potassium: 3.3 mmol/L — ABNORMAL LOW (ref 3.5–5.1)
Sodium: 133 mmol/L — ABNORMAL LOW (ref 135–145)

## 2024-09-07 LAB — ACTH STIMULATION, 3 TIME POINTS
Cortisol, 30 Min: 15.9 ug/dL
Cortisol, 60 Min: 18.1 ug/dL
Cortisol, Base: 4.8 ug/dL

## 2024-09-07 LAB — SODIUM
Sodium: 135 mmol/L (ref 135–145)
Sodium: 131 mmol/L — ABNORMAL LOW (ref 135–145)
Sodium: 130 mmol/L — ABNORMAL LOW (ref 135–145)

## 2024-09-07 MED ORDER — MELATONIN 5 MG PO TABS
5.0000 mg | ORAL_TABLET | Freq: Every evening | ORAL | Status: DC | PRN
Start: 2024-09-07 — End: 2024-09-09
  Administered 2024-09-07: 5 mg via ORAL
  Filled 2024-09-07: qty 1

## 2024-09-07 MED ORDER — COSYNTROPIN 0.25 MG IJ SOLR
0.2500 mg | Freq: Once | INTRAMUSCULAR | Status: AC
  Administered 2024-09-07: 0.25 mg via INTRAVENOUS
  Filled 2024-09-07: qty 0.25

## 2024-09-07 MED ORDER — DEXTROSE 5 % IV SOLN: INTRAVENOUS | Status: DC

## 2024-09-07 MED ORDER — POTASSIUM CHLORIDE CRYS ER 20 MEQ PO TBCR
40.0000 meq | EXTENDED_RELEASE_TABLET | Freq: Once | ORAL | Status: AC
  Administered 2024-09-07: 40 meq via ORAL
  Filled 2024-09-07: qty 2

## 2024-09-07 MED ORDER — CHLORHEXIDINE GLUCONATE CLOTH 2 % EX PADS
6.0000 | MEDICATED_PAD | Freq: Every day | CUTANEOUS | Status: DC
  Administered 2024-09-07 – 2024-09-08 (×2): 6 via TOPICAL

## 2024-09-07 MED ORDER — DEXTROSE 5 % IV SOLN
INTRAVENOUS | Status: DC
Start: 2024-09-07 — End: 2024-09-07

## 2024-09-07 NOTE — Progress Notes (Signed)
 Patient complained of pain to lower abdomen and stated that he was having difficulties voiding. Bladder scan done 582 ml scan result. In and out cath done 900 ml removed. NP notified, Patient in stable condition.

## 2024-09-07 NOTE — TOC Progression Note (Signed)
 Transition of Care [facility]) - Progression Note    Patient Details  Name: Joseph Rocha MRN: [id number] Date of Birth: June 12, 1935  [facility] CM/SW Contact  Doneta Glenys DASEN, RN Phone Number: 09/07/2024, 3:42 PM  Clinical Narrative:    Patient seen by PT no recommendations.   Expected Discharge Plan: Home/Self Care Barriers to Discharge: Continued Medical Work up, No Barriers Identified               Expected Discharge Plan and Services In-house Referral: NA Discharge Planning Services: CM Consult Post Acute Care Choice: NA Living arrangements for the past 2 months: Single Family Home                 DME Arranged: N/A DME Agency: NA       HH Arranged: NA HH Agency: NA         Social Drivers of Health (SDOH) Interventions SDOH Screenings   Food Insecurity: No Food Insecurity (09/04/2024)  Housing: Low Risk  (09/04/2024)  Transportation Needs: No Transportation Needs (09/04/2024)  Utilities: Not At Risk (09/04/2024)  Alcohol Screen: Low Risk  (01/24/2024)  Depression (PHQ2-9): Low Risk  (08/28/2024)  Financial Resource Strain: Low Risk  (01/24/2024)  Physical Activity: Insufficiently Active (01/24/2024)  Social Connections: Moderately Isolated (09/04/2024)  Stress: No Stress Concern Present (01/24/2024)  Tobacco Use: Low Risk  (09/04/2024)  Health Literacy: Adequate Health Literacy (01/24/2024)    Readmission Risk Interventions    11/08/2023   11:48 AM  Readmission Risk Prevention Plan  Transportation Screening Complete  PCP or Specialist Appt within 5-7 Days Complete  Home Care Screening Complete  Medication Review (RN CM) Complete

## 2024-09-07 NOTE — Discharge Summary (Incomplete)
 Physician Discharge Summary  Joseph Rocha FMW:[id number] DOB: 22-Sep-1935 DOA: 09/04/2024  PCP: Norleen Lynwood ORN, MD  Admit date: 09/04/2024 Discharge date: 09/08/2024  Admitted From: Home Disposition:  Home  Discharge Condition:Stable CODE STATUS:FULL Diet recommendation: Carb Modified  Brief/Interim Summary: Patient is 89 year old male with history of diabetes type 2, hypertension, CKD stage III, paroxysmal A-fib not on anticoagulation, BPH who presented with generalized body ache, nausea, vomiting. He was recently started on antibiotics and tramadol  for back pain and abdominal cramping. On presentation, he was hemodynamically stable. Lab work showed creatinine of 1.2, sodium of 129. CT abdomen/pelvis showed uncomplicated proximal sigmoid colon diverticulitis. Patient is started on iv antibiotics.Admitted for the management of acute diverticulitis.  Abdominal pain has completely resolved now.  Abdomen is benign on examination.  Tolerating solid diet.  Hospital course also remarkable for hyponatremia, likely from SIADH.  Given a dose of tolvaptan.  Sodium level stable today.  Patient does not want to stay another day in the hospital.  We have recommended him to follow-up with his PCP in a week.  Following problems were addressed during the hospitalization:  Acute uncomplicated diverticulitis: Was having abdominal cramping, back pain recently.  Outpatient workup was also concerning for UTI.  Was taking Cipro .  Did not tolerate pain medication or antibiotic and presented with nausea and vomiting.  CT findings  as above. Started  on Zosyn.   Abdominal pain has completely resolved now.  Abdomen is benign on examination.Abx changed to augmentin    Hyponatremia: Patient looks euvolemic.  Sodium dropped to 118, nephrology consulted.  Given a dose of tolvaptan on 11/1.  Last sodium level of 132.   We have recommended him to do a BMP just to check his sodium level within the next few days.  Acute urinary  retention/hematuria/BPH:  Currently on Flomax , Proscar .  Follows with urology, Dr. Devere.   Urology consulted. Foley placed.  Urology recommended continue Foley until outpatient follow-up with Dr. Devere  Diabetes type 2: Takes metformin  at home.   CKD stage IIIa: Currently kidney function at baseline    History of paroxysmal A-fib: Currently in normal sinus rhythm.  Not on anticoagulation.  Patient denies any history of A-fib.   UTI: He was complaining of dysuria.  UA does not support UTI.  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  Takes testosterone  pump at home.  Follow-up with endocrinology.  A.m. cortiso was low.  Continue Florinef  at current dose.   ACTH stimulation test result shows normal response with cortisol of more than 18 after 60 minutes.  We recommend to follow-up with endocrinology as an outpatient.   Debility/deconditioning: Patient lives with his wife.  PT consulted, no follow-up recommended         Discharge Diagnoses:  Principal Problem:   Acute diverticulitis    Discharge Instructions  Discharge Instructions     Diet Carb Modified   Complete by: As directed    Discharge instructions   Complete by: As directed    1)Please take your medications as instructed 2)Follow up with your PCP in a week.Do a BMP test to check your sodium level during the follow up 3)You might be called by nephrology for follow up appointment 4)Please follow up with Dr. Devere, urology as an outpatient in a week.  You will be discharged with Foley catheter.   Increase activity slowly   Complete by: As directed  Allergies as of 09/08/2024       Reactions   Lipitor [atorvastatin ] Other (See Comments)   Myalgias, cramps in hand        Medication List     STOP taking these medications    ciprofloxacin  500 MG tablet Commonly known as: Cipro     gabapentin  100 MG capsule Commonly known as: NEURONTIN    HYDROcodone -acetaminophen  5-325 MG tablet Commonly known as: NORCO/VICODIN   predniSONE  10 MG tablet Commonly known as: DELTASONE    traMADol  50 MG tablet Commonly known as: ULTRAM        TAKE these medications    amoxicillin -clavulanate 500-125 MG tablet Commonly known as: Augmentin  Take 1 tablet by mouth 2 (two) times daily for 6 days.   aspirin  EC 81 MG tablet Take 1 tablet (81 mg total) by mouth daily. Swallow whole.   calcium  carbonate 600 MG Tabs tablet Commonly known as: OS-CAL Take 600 mg by mouth daily.   cetirizine  10 MG tablet Commonly known as: ZYRTEC  Take 1 tablet (10 mg total) by mouth daily.   cyclobenzaprine  5 MG tablet Commonly known as: FLEXERIL  Take 1 tablet (5 mg total) by mouth 3 (three) times daily as needed. What changed: reasons to take this   ferrous sulfate  325 (65 FE) MG EC tablet Take 325 mg by mouth daily.   finasteride  5 MG tablet Commonly known as: PROSCAR  TAKE 1 TABLET DAILY   Fish Oil  1000 MG Caps Take 1 capsule by mouth daily.   fludrocortisone  0.1 MG tablet Commonly known as: FLORINEF  Take 1 tablet (100 mcg total) by mouth daily.   fluticasone  50 MCG/ACT nasal spray Commonly known as: FLONASE  Place 2 sprays into both nostrils daily.   folic acid  1 MG tablet Commonly known as: FOLVITE  TAKE 1 TABLET DAILY   Klor-Con  M20 20 MEQ tablet Generic drug: potassium chloride  SA Take 20 mEq by mouth daily.   levothyroxine  50 MCG tablet Commonly known as: Synthroid  Take 1 tablet (50 mcg total) by mouth daily.   loperamide  2 MG tablet Commonly known as: IMODIUM  A-D Take 1 tablet (2 mg total) by mouth 2 (two) times daily as needed for diarrhea or loose stools.   Magnesium  200 MG Tabs 2 tab by mouth once daily What changed:  how much to take how to take this when to take this   meloxicam  7.5 MG tablet Commonly known as: MOBIC  Take 1 tablet (7.5 mg total) by mouth  daily as needed for pain.   metFORMIN  500 MG tablet Commonly known as: GLUCOPHAGE  Take 1 tablet (500 mg total) by mouth 2 (two) times daily with a meal.   methocarbamol  500 MG tablet Commonly known as: ROBAXIN  1-2 tab by mouth at bedtime as needed   omeprazole  40 MG capsule Commonly known as: PRILOSEC Take 1 capsule (40 mg total) by mouth daily.   ondansetron  4 MG disintegrating tablet Commonly known as: ZOFRAN -ODT Take 1 tablet (4 mg total) by mouth every 8 (eight) hours as needed for nausea or vomiting.   repaglinide  2 MG tablet Commonly known as: PRANDIN  Take 1 tablet (2 mg total) by mouth 2 (two) times daily before a meal. TAKE 1 TABLET TWICE DAILY  BEFORE MEALS   rosuvastatin  5 MG tablet Commonly known as: CRESTOR  TAKE 1 TABLET DAILY   tamsulosin  0.4 MG Caps capsule Commonly known as: FLOMAX  Take 0.4 mg by mouth daily.   Testosterone  30 MG/ACT Soln Place 2 Pump onto the skin daily.        Follow-up Information  Norleen Lynwood ORN, MD. Schedule an appointment as soon as possible for a visit in 1 week(s).   Specialties: Internal Medicine, Radiology Contact information: [address] [place] [address] [phone]         Devere Lonni Righter, MD. Schedule an appointment as soon as possible for a visit in 1 week(s).   Specialty: Urology Contact information: [address] [place] [address] [phone]                Allergies  Allergen Reactions   Lipitor [Atorvastatin ] Other (See Comments)    Myalgias, cramps in hand    Consultations: Nephrology,urology   Procedures/Studies: CT ABDOMEN PELVIS W CONTRAST Result Date: 09/04/2024 EXAM: CT ABDOMEN AND PELVIS WITH CONTRAST 09/04/2024 06:15:55 AM TECHNIQUE: CT of the abdomen and pelvis was performed with the administration of 100 mL of iohexol  (OMNIPAQUE ) 300 MG/ML solution. Multiplanar reformatted images are provided for review. Automated exposure control, iterative  reconstruction, and/or weight-based adjustment of the mA/kV was utilized to reduce the radiation dose to as low as reasonably achievable. COMPARISON: 11/01/2023 and 07/31/2022. CLINICAL HISTORY: Abdominal pain, acute, nonlocalized. Patient was taking antibiotics for urinary tract infection earlier this week but had nausea and vomiting side effects and discontinued the antibiotics 2 days ago. FINDINGS: LOWER CHEST: Small hiatal hernia. Lung bases exhibit mosaicism in the lower lobes consistent with air trapping. No focal pneumonia. The cardiac size is normal. LIVER: The liver is mildly steatotic. There is a 2.6 cm cyst in segment 4b, Hounsfield density is 15, stable. There is no mass enhancement in the liver. GALLBLADDER AND BILE DUCTS: Gallbladder is unremarkable. No biliary ductal dilatation. SPLEEN: No acute abnormality. PANCREAS: There is partial atrophy of the pancreatic head and neck. There is no pancreatic mass enhancement or ductal dilatation. ADRENAL GLANDS: No acute abnormality. KIDNEYS, URETERS AND BLADDER: No stones in the kidneys or ureters. No hydronephrosis. No perinephric or periureteral stranding. Enlarged prostate again measuring nearly 6 cm moderately impresses into the bladder base as before. Numerous pelvic phleboliths. GI AND BOWEL: The gastric wall and unopacified small bowel are unremarkable. The appendix is not seen in this patient. There is fluid in the ascending colon. There is diffuse diverticulosis in the descending and sigmoid segments with inflammatory stranding along the proximal half of the descending colon consistent with acute diverticulitis. There is no pneumatosis, free air, free fluid or free hemorrhage. There is no bowel obstruction. PERITONEUM AND RETROPERITONEUM: No ascites. No free air. No free fluid or free hemorrhage. VASCULATURE: Aorta is normal in caliber. There are patchy aortic calcific plaques without aneurysm, stenosis, or dissection. LYMPH NODES: No lymphadenopathy.  REPRODUCTIVE ORGANS: Enlarged prostate again measuring nearly 6 cm moderately impresses into the bladder base as before. Numerous pelvic phleboliths. BONES AND SOFT TISSUES: Degenerative changes of the spine and both hips, in the lumbar spine with advanced L4-L5 facet hypertrophy with acquired spinal stenosis and grade 1 anterolisthesis. No acute or other significant osseous findings. No focal soft tissue abnormality. IMPRESSION: 1. Acute diverticulitis involving the proximal descending colon without pneumatosis, free air, free fluid, or hemorrhage. 2. lumbar spondylosis with severe L4-L5 facet hypertrophy causing acquired spinal stenosis and grade 1 anterolisthesis. Bilateral hip degenerative changes. Electronically signed by: Francis Quam MD 09/04/2024 07:06 AM EDT RP Workstation: [id number]   DG Chest 2 View Result Date: 09/04/2024 EXAM: 2 VIEW(S) XRAY OF THE CHEST 09/04/2024 05:39:59 AM COMPARISON: PA lat chest 08/28/2024 CLINICAL HISTORY: weakness FINDINGS: LINES, TUBES AND DEVICES: multiple overlying  telemetry leads. LUNGS AND PLEURA: No focal pulmonary opacity. No pulmonary edema. No pleural effusion. No pneumothorax. HEART AND MEDIASTINUM: Stable mediastinum with aortic tortuosity and atherosclerosis. BONES AND SOFT TISSUES: moderate thoracic spondylosis with no new osseous findings. IMPRESSION: 1. No acute cardiopulmonary process. Stable chest. Electronically signed by: Francis Quam MD 09/04/2024 06:00 AM EDT RP Workstation: [id number]   DG Chest 2 View Result Date: 08/28/2024 CLINICAL DATA:  diffuse pain to neck and back, aching all over EXAM: CHEST - 2 VIEW COMPARISON:  11/05/2023 FINDINGS: No focal airspace consolidation, pleural effusion, or pneumothorax. No cardiomegaly. Unchanged tortuosity/ectasia of the aorta with aortic atherosclerosis. No acute fracture or destructive lesions. Multilevel thoracic osteophytosis. IMPRESSION: No acute cardiopulmonary abnormality. Electronically Signed   By:  Rogelia Myers M.D.   On: 08/28/2024 19:49      Subjective: Patient seen and examined at bedside today.  Very comfortable this morning.  Tolerating solid diet.  Foley draining clear urine.  Very eager to go home today.  Medically stable for discharge.  Discharge Exam: Vitals:   09/07/24 1934 09/08/24 0535  BP: (!) 125/93 102/64  Pulse: 71 (!) 55  Resp: 15 15  Temp: 98.1 F (36.7 C) (!) 97.5 F (36.4 C)  SpO2: 99% 99%   Vitals:   09/07/24 1413 09/07/24 1934 09/08/24 0500 09/08/24 0535  BP: 131/82 (!) 125/93  102/64  Pulse: 66 71  (!) 55  Resp: 19 15  15   Temp: 97.9 F (36.6 C) 98.1 F (36.7 C)  (!) 97.5 F (36.4 C)  TempSrc:    Oral  SpO2: 100% 99%  99%  Weight:   87.8 kg   Height:        General: Pt is alert, awake, not in acute distress Cardiovascular: RRR, S1/S2 +, no rubs, no gallops Respiratory: CTA bilaterally, no wheezing, no rhonchi Abdominal: Soft, NT, ND, bowel sounds + Extremities: no edema, no cyanosis GU: Foley catheter    The results of significant diagnostics from this hospitalization (including imaging, microbiology, ancillary and laboratory) are listed below for reference.     Microbiology: Recent Results (from the past 240 hours)  Resp panel by RT-PCR (RSV, Flu A&B, Covid) Anterior Nasal Swab     Status: None   Collection Time: 09/04/24  4:22 AM   Specimen: Anterior Nasal Swab  Result Value Ref Range Status   SARS Coronavirus 2 by RT PCR NEGATIVE NEGATIVE Final    Comment: (NOTE) SARS-CoV-2 target nucleic acids are NOT DETECTED.  The SARS-CoV-2 RNA is generally detectable in upper respiratory specimens during the acute phase of infection. The lowest concentration of SARS-CoV-2 viral copies this assay can detect is 138 copies/mL. A negative result does not preclude SARS-Cov-2 infection and should not be used as the sole basis for treatment or other patient management decisions. A negative result may occur with  improper specimen  collection/handling, submission of specimen other than nasopharyngeal swab, presence of viral mutation(s) within the areas targeted by this assay, and inadequate number of viral copies(<138 copies/mL). A negative result must be combined with clinical observations, patient history, and epidemiological information. The expected result is Negative.  Fact Sheet for Patients:  bloggercourse.com  Fact Sheet for Healthcare Providers:  seriousbroker.it  This test is no t yet approved or cleared by the [place]  FDA and  has been authorized for detection and/or diagnosis of SARS-CoV-2 by FDA under an Emergency Use Authorization (EUA). This EUA will remain  in effect (meaning this test can be used) for the  duration of the COVID-19 declaration under Section 564(b)(1) of the Act, 21 U.S.C.section 360bbb-3(b)(1), unless the authorization is terminated  or revoked sooner.       Influenza A by PCR NEGATIVE NEGATIVE Final   Influenza B by PCR NEGATIVE NEGATIVE Final    Comment: (NOTE) The Xpert Xpress SARS-CoV-2/FLU/RSV plus assay is intended as an aid in the diagnosis of influenza from Nasopharyngeal swab specimens and should not be used as a sole basis for treatment. Nasal washings and aspirates are unacceptable for Xpert Xpress SARS-CoV-2/FLU/RSV testing.  Fact Sheet for Patients: bloggercourse.com  Fact Sheet for Healthcare Providers: seriousbroker.it  This test is not yet approved or cleared by the [place]  FDA and has been authorized for detection and/or diagnosis of SARS-CoV-2 by FDA under an Emergency Use Authorization (EUA). This EUA will remain in effect (meaning this test can be used) for the duration of the COVID-19 declaration under Section 564(b)(1) of the Act, 21 U.S.C. section 360bbb-3(b)(1), unless the authorization is terminated or revoked.     Resp Syncytial  Virus by PCR NEGATIVE NEGATIVE Final    Comment: (NOTE) Fact Sheet for Patients: bloggercourse.com  Fact Sheet for Healthcare Providers: seriousbroker.it  This test is not yet approved or cleared by the [place]  FDA and has been authorized for detection and/or diagnosis of SARS-CoV-2 by FDA under an Emergency Use Authorization (EUA). This EUA will remain in effect (meaning this test can be used) for the duration of the COVID-19 declaration under Section 564(b)(1) of the Act, 21 U.S.C. section 360bbb-3(b)(1), unless the authorization is terminated or revoked.  Performed at [facility], [address], [place], [place] [address]      Labs: BNP (last 3 results) No results for input(s): BNP in the last 8760 hours. Basic Metabolic Panel: Recent Labs  Lab 09/05/24 0459 09/05/24 1558 09/05/24 2224 09/06/24 0423 09/06/24 0806 09/07/24 0718 09/07/24 0915 09/07/24 1745 09/07/24 2104 09/08/24 0123 09/08/24 0639  NA 122* 118*   < > 122*   < > 133* 135 131* 130* 131* 132*  K 4.0 3.8  --  3.7  --  3.3*  --   --   --  3.3*  --   CL 90* 88*  --  90*  --  101  --   --   --  99  --   CO2 23 21*  --  23  --  21*  --   --   --  23  --   GLUCOSE 87 121*  --  99  --  121*  --   --   --  126*  --   BUN 7* 6*  --  7*  --  13  --   --   --  13  --   CREATININE 1.21 1.17  --  1.17  --  1.35*  --   --   --  1.14  --   CALCIUM  8.4* 8.5*  --  9.0  --  9.1  --   --   --  8.6*  --    < > = values in this interval not displayed.   Liver Function Tests: Recent Labs  Lab 09/04/24 0422  AST 25  ALT 12  ALKPHOS 57  BILITOT 0.5  PROT 6.7  ALBUMIN 3.9   Recent Labs  Lab 09/04/24 0422  LIPASE 21   No results for input(s): AMMONIA in the last 168 hours. CBC: Recent Labs  Lab 09/04/24 0422  WBC  7.3  NEUTROABS 4.5  HGB 11.9*  HCT 35.8*  MCV 91.1  PLT 186   Cardiac Enzymes: Recent Labs  Lab 09/04/24 0422   CKTOTAL 197   BNP: Invalid input(s): POCBNP CBG: Recent Labs  Lab 09/07/24 1543 09/07/24 1930 09/07/24 2349 09/08/24 0345 09/08/24 0821  GLUCAP 139* 145* 169* 86 93   D-Dimer No results for input(s): DDIMER in the last 72 hours. Hgb A1c No results for input(s): HGBA1C in the last 72 hours. Lipid Profile No results for input(s): CHOL, HDL, LDLCALC, TRIG, CHOLHDL, LDLDIRECT in the last 72 hours. Thyroid  function studies Recent Labs    09/06/24 0423  TSH 5.650*   Anemia work up No results for input(s): VITAMINB12, FOLATE, FERRITIN, TIBC, IRON, RETICCTPCT in the last 72 hours. Urinalysis    Component Value Date/Time   COLORURINE YELLOW 09/04/2024 0420   APPEARANCEUR CLEAR 09/04/2024 0420   LABSPEC 1.011 09/04/2024 0420   PHURINE 6.0 09/04/2024 0420   GLUCOSEU NEGATIVE 09/04/2024 0420   GLUCOSEU NEGATIVE 08/28/2024 1548   HGBUR NEGATIVE 09/04/2024 0420   BILIRUBINUR NEGATIVE 09/04/2024 0420   KETONESUR NEGATIVE 09/04/2024 0420   PROTEINUR NEGATIVE 09/04/2024 0420   UROBILINOGEN 0.2 08/28/2024 1548   NITRITE NEGATIVE 09/04/2024 0420   LEUKOCYTESUR NEGATIVE 09/04/2024 0420   Sepsis Labs Recent Labs  Lab 09/04/24 0422  WBC 7.3   Microbiology Recent Results (from the past 240 hours)  Resp panel by RT-PCR (RSV, Flu A&B, Covid) Anterior Nasal Swab     Status: None   Collection Time: 09/04/24  4:22 AM   Specimen: Anterior Nasal Swab  Result Value Ref Range Status   SARS Coronavirus 2 by RT PCR NEGATIVE NEGATIVE Final    Comment: (NOTE) SARS-CoV-2 target nucleic acids are NOT DETECTED.  The SARS-CoV-2 RNA is generally detectable in upper respiratory specimens during the acute phase of infection. The lowest concentration of SARS-CoV-2 viral copies this assay can detect is 138 copies/mL. A negative result does not preclude SARS-Cov-2 infection and should not be used as the sole basis for treatment or other patient management  decisions. A negative result may occur with  improper specimen collection/handling, submission of specimen other than nasopharyngeal swab, presence of viral mutation(s) within the areas targeted by this assay, and inadequate number of viral copies(<138 copies/mL). A negative result must be combined with clinical observations, patient history, and epidemiological information. The expected result is Negative.  Fact Sheet for Patients:  bloggercourse.com  Fact Sheet for Healthcare Providers:  seriousbroker.it  This test is no t yet approved or cleared by the [place]  FDA and  has been authorized for detection and/or diagnosis of SARS-CoV-2 by FDA under an Emergency Use Authorization (EUA). This EUA will remain  in effect (meaning this test can be used) for the duration of the COVID-19 declaration under Section 564(b)(1) of the Act, 21 U.S.C.section 360bbb-3(b)(1), unless the authorization is terminated  or revoked sooner.       Influenza A by PCR NEGATIVE NEGATIVE Final   Influenza B by PCR NEGATIVE NEGATIVE Final    Comment: (NOTE) The Xpert Xpress SARS-CoV-2/FLU/RSV plus assay is intended as an aid in the diagnosis of influenza from Nasopharyngeal swab specimens and should not be used as a sole basis for treatment. Nasal washings and aspirates are unacceptable for Xpert Xpress SARS-CoV-2/FLU/RSV testing.  Fact Sheet for Patients: bloggercourse.com  Fact Sheet for Healthcare Providers: seriousbroker.it  This test is not yet approved or cleared by the [place]  FDA and has been authorized for  detection and/or diagnosis of SARS-CoV-2 by FDA under an Emergency Use Authorization (EUA). This EUA will remain in effect (meaning this test can be used) for the duration of the COVID-19 declaration under Section 564(b)(1) of the Act, 21 U.S.C. section 360bbb-3(b)(1), unless the  authorization is terminated or revoked.     Resp Syncytial Virus by PCR NEGATIVE NEGATIVE Final    Comment: (NOTE) Fact Sheet for Patients: bloggercourse.com  Fact Sheet for Healthcare Providers: seriousbroker.it  This test is not yet approved or cleared by the [place]  FDA and has been authorized for detection and/or diagnosis of SARS-CoV-2 by FDA under an Emergency Use Authorization (EUA). This EUA will remain in effect (meaning this test can be used) for the duration of the COVID-19 declaration under Section 564(b)(1) of the Act, 21 U.S.C. section 360bbb-3(b)(1), unless the authorization is terminated or revoked.  Performed at [facility], [address] [place], [place] [address]     Please note: You were cared for by a hospitalist during your hospital stay. Once you are discharged, your primary care physician will handle any further medical issues. Please note that NO REFILLS for any discharge medications will be authorized once you are discharged, as it is imperative that you return to your primary care physician (or establish a relationship with a primary care physician if you do not have one) for your post hospital discharge needs so that they can reassess your need for medications and monitor your lab values.    Time coordinating discharge: 40 minutes  SIGNED:   Ivonne Mustache, MD  [facility] 09/08/2024, 10:24 AM Pager [phone]  If 7PM-7AM, please contact night-coverage [email or web address] Password [email or web address]

## 2024-09-07 NOTE — Progress Notes (Signed)
  [facility] Progress Note  Subjective:  Ordered D5W this AM on note of relatively rapid correction of sodium.  My order and sodium checks were discontinued in the interim.  Spoke with patient - He had refused to stay this AM and ultimately has ended up staying as he has had urinary retention.  AM cortisol low previously and s/p ACTH stim test today.  Just had a catheter placed.  Earlier this AM when he was cath'd they got 900 mL for the bladder scan of 582 mL.   Review of systems:  He denies any shortness of breath or chest pain  He denies nausea or vomiting  Ambulated in the hall today  States has a large prostate   Vitals:   09/06/24 1934 09/07/24 0500 09/07/24 0541 09/07/24 1413  BP: 104/66  102/62 131/82  Pulse: 76  69 66  Resp: 17  16 19   Temp: (!) 97.5 F (36.4 C)  98.1 F (36.7 C) 97.9 F (36.6 C)  TempSrc: Oral     SpO2: 97%  92% 100%  Weight:  87.2 kg    Height:        Physical Exam:  General elderly male in bed in no acute distress HEENT normocephalic atraumatic extraocular movements intact sclera anicteric Neck supple trachea midline Lungs clear to auscultation bilaterally normal work of breathing at rest  Heart S1S2 no rub Abdomen soft nontender nondistended Extremities no edema lower extremities Psych normal mood and affect Neuro - alert and oriented x 3 provides hx and follows commands GU - foley in place   Home meds: Florinef  0.1mg  qd Klor-con  20meq daily Others: asa, proscar , neurontin , norco, mobic  prn, metformin , robaxin  prn, prilosec, prednisone  short course, crestor , flomax , testosterone  every day, ultram  prn   BP: pt admit 10/31 w/ BP 142/84, today is down slightly at 110-135/ 70-85 UA: negative on 10/31, no LE/ Hb, protein negative UNa, UOsm pend CXR 10/31: no active disease CT abd/ pelvis +contrast on 09/04/24: IMPRESSION: Acute diverticulitis involving the proximal descending colon without pneumatosis, free air, free fluid, or  hemorrhage. Lumbar spondylosis with severe L4-L5 facet hypertrophy causing acquired spinal stenosis and grade 1 anterolisthesis. Bilateral hip degenerative changes.KIDNEYS, URETERS AND BLADDER: No stones in the kidneys or ureters. No hydronephrosis. No perinephric or periureteral stranding. Am cortisol- low at 3.6 (6-22)    Assessment/ Plan: Hyponatremia: in euvolemic patient admitted for acute diverticulitis. Na was 129 on admit, then dropped to 122 and 118 while getting isotonic IVF's. IVF's were stopped. No exam/ labs findings of cirrhosis, CHF or renal failure. UNa is normal which is c/w SIADH. Also poor response to NS also points to [facility]. Possible causes are abd pain and adrenal insuffuciency. Pt rec'd tolvaptan.  He was also started on salt tabs and fluid restriction.  Nephrology has d/w pmd about w/u for adrenal insufficiency (already sees an endocrinologist., taking fluorinef). ACTH stim test is pending for tomorrow. I have re-ordered D5W given the quick correction.  Set at 100 ml/hr I have re-ordered sodium check for now and every 4 hours Order to page or call nephrology on call if sodium is above 137  He is off of salt tablets Acute diverticulitis: getting IV zosyn. Abd pain much less, appetite improving.  Urinary retention Catheter is in place now  Disposition - continue inpatient monitoring    Recent Labs  Lab 09/04/24 0422 09/05/24 0459 09/06/24 0423 09/07/24 0718  HGB 11.9*  --   --   --   ALBUMIN 3.9  --   --   --  CALCIUM  9.0   < > 9.0 9.1  CREATININE 1.25*   < > 1.17 1.35*  K 4.2   < > 3.7 3.3*   < > = values in this interval not displayed.   No results for input(s): IRON, TIBC, FERRITIN in the last 168 hours. Inpatient medications:  Chlorhexidine Gluconate Cloth  6 each Topical Daily   finasteride   5 mg Oral Daily   fludrocortisone   100 mcg Oral Daily   levothyroxine   50 mcg Oral Daily   sodium chloride  flush  3 mL Intravenous Q12H   tamsulosin   0.4 mg  Oral Daily    dextrose     piperacillin-tazobactam (ZOSYN)  IV 3.375 g (09/07/24 0848)   acetaminophen  **OR** acetaminophen , morphine injection, ondansetron  **OR** ondansetron  (ZOFRAN ) IV   Katheryn JAYSON Saba, MD 5:41 PM 09/07/2024

## 2024-09-07 NOTE — Plan of Care (Signed)
 Noted rapid correction of hyponatremia over the past couple of day and I ordered D5W this AM.  Patient refused to stay. Hospitalist team reached out to me.    On chart review, I note that he had urinary retention this AM.  Had in/out cath.  I notified primary team of this note as well.    Spoke with patient via phone.  I recommended continued inpatient monitoring.  He refuses to stay.   I recommended follow-up with his PCP and follow-up with our office - both of which he's willing to do.  I have requested that he be seen by our office in a week   Katheryn JAYSON Saba, MD 11:32 AM 09/07/2024

## 2024-09-07 NOTE — Progress Notes (Signed)
 PROGRESS NOTE  Joseph Rocha  FMW:[id number] DOB: 02-02-1935 DOA: 09/04/2024 PCP: Norleen Lynwood ORN, MD   Brief Narrative: Patient is 89 year old male with history of diabetes type 2, hypertension, CKD stage III, paroxysmal A-fib not on anticoagulation, BPH who presented with generalized body ache, nausea, vomiting. He was recently started on antibiotics and tramadol  for back pain and abdominal cramping. On presentation, he was hemodynamically stable. Lab work showed creatinine of 1.2, sodium of 129. CT abdomen/pelvis showed uncomplicated proximal sigmoid colon diverticulitis. Patient is started on iv antibiotics.Admitted for the management of acute diverticulitis.  Clinically improving.  Hospital course remarkable for hyponatremia, likely SIADH.  Sodium level has improved.  Has also developed hematuria and urinary retension today, urology consulted  Assessment & Plan:  Principal Problem:   Acute diverticulitis  Acute uncomplicated diverticulitis: Was having abdominal cramping, back pain recently.  Outpatient workup was also concerning for UTI.  Was taking Cipro .  Did not tolerate pain medication or antibiotic and presented with nausea and vomiting.  CT findings  as above.  Currently on Zosyn.  He does not complain of any abdominal today.  Abdomen is benign on examination.  Currently on  soft diet.  Tolerating.  Acute urinary retention/hematuria/BPH:  Currently on Flomax , Proscar .  Follows with urology, Dr. Devere.  Developed urinary retention today.  Passing some blood through urethra.  Complains of lower abdominal pain.  Urology consulted.  Did in and out early this morning with finding of 900 mL of urine.Will place coude catheter as recommended by urology  Hyponatremia:Patient looks euvolemic.  Sodium dropped to 118, nephrology consulted.  Given a dose of tolvaptan on 11/1.Given salt tabs.  Last sodium level of 135.   Diabetes type 2: Takes metformin  at home.   CKD stage IIIa: Currently kidney  function at baseline    History of paroxysmal A-fib: Currently in normal sinus rhythm.  Not on anticoagulation.  Patient denies any history of A-fib.   UTI: He was complaining of dysuria.  UA does not support UTI.  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  Takes testosterone  pump at home.  Follow-up with endocrinology.  A.m. cortiso was low.  Continue Florinef  at current dose.   ACTH stimulation test result pending.  We recommend to follow-up with endocrinology as an outpatient.   Debility/deconditioning: Patient lives with his wife.  PT consulted, no follow-up recommended         DVT prophylaxis:enoxaparin  (LOVENOX ) injection 40 mg Start: 09/04/24 1000     Code Status: Full Code  Family Communication: Discussed with  spouse [name] at bedside on 11/3  Patient status:Inpatient  Patient is from :home  Anticipated discharge un:ynfz  Estimated DC date:tomorrow   Consultants: Nephrology,urology  Procedures:None  Antimicrobials:  Anti-infectives (From admission, onward)    Start     Dose/Rate Route Frequency Ordered Stop   09/04/24 1600  piperacillin-tazobactam (ZOSYN) IVPB 3.375 g        3.375 g 12.5 mL/hr over 240 Minutes Intravenous Every 8 hours 09/04/24 0852     09/04/24 0715  piperacillin-tazobactam (ZOSYN) IVPB 3.375 g        3.375 g 100 mL/hr over 30 Minutes Intravenous  Once 09/04/24 0711 09/04/24 1022       Subjective: Patient seen and examined at bedside today.  Hemodynamically stable.  No abdominal pain, nausea or vomiting.  Tolerating soft diet.  New problem is urinary retention.  He had  a small volume gross hematuria this afternoon while attempting to urinate. Discharge canceled.Case was discussed with urology, NP Ole Delia lovely recommended to put Coude catheter  Objective: Vitals:   09/06/24 1934 09/07/24 0500 09/07/24  0541 09/07/24 1413  BP: 104/66  102/62 131/82  Pulse: 76  69 66  Resp: 17  16 19   Temp: (!) 97.5 F (36.4 C)  98.1 F (36.7 C) 97.9 F (36.6 C)  TempSrc: Oral     SpO2: 97%  92% 100%  Weight:  87.2 kg    Height:        Intake/Output Summary (Last 24 hours) at 09/07/2024 1418 Last data filed at 09/07/2024 0600 Gross per 24 hour  Intake --  Output 900 ml  Net -900 ml   Filed Weights   09/05/24 2024 09/06/24 0500 09/07/24 0500  Weight: 87.4 kg 87.4 kg 87.2 kg    Examination:  General exam: Overall comfortable, not in distress HEENT: PERRL Respiratory system:  no wheezes or crackles  Cardiovascular system: S1 & S2 heard, RRR.  Gastrointestinal system: Abdomen is nondistended, soft and nontender. Central nervous system: Alert and oriented Extremities: No edema, no clubbing ,no cyanosis Skin: No rashes, no ulcers,no icterus     Data Reviewed: I have personally reviewed following labs and imaging studies  CBC: Recent Labs  Lab 09/04/24 0422  WBC 7.3  NEUTROABS 4.5  HGB 11.9*  HCT 35.8*  MCV 91.1  PLT 186   Basic Metabolic Panel: Recent Labs  Lab 09/04/24 0422 09/05/24 0459 09/05/24 1558 09/05/24 2224 09/06/24 0423 09/06/24 0806 09/06/24 1314 09/06/24 1837 09/07/24 0718 09/07/24 0915  NA 129* 122* 118*   < > 122* 125* 128* 130* 133* 135  K 4.2 4.0 3.8  --  3.7  --   --   --  3.3*  --   CL 95* 90* 88*  --  90*  --   --   --  101  --   CO2 23 23 21*  --  23  --   --   --  21*  --   GLUCOSE 79 87 121*  --  99  --   --   --  121*  --   BUN 14 7* 6*  --  7*  --   --   --  13  --   CREATININE 1.25* 1.21 1.17  --  1.17  --   --   --  1.35*  --   CALCIUM  9.0 8.4* 8.5*  --  9.0  --   --   --  9.1  --    < > = values in this interval not displayed.     Recent Results (from the past 240 hours)  Resp panel by RT-PCR (RSV, Flu A&B, Covid) Anterior Nasal Swab     Status: None   Collection Time: 09/04/24  4:22 AM   Specimen: Anterior Nasal Swab  Result Value Ref  Range Status   SARS Coronavirus 2 by RT PCR NEGATIVE NEGATIVE Final    Comment: (NOTE) SARS-CoV-2 target nucleic acids are NOT DETECTED.  The SARS-CoV-2 RNA is generally detectable in upper respiratory specimens during the acute phase of infection. The lowest concentration of SARS-CoV-2 viral copies this assay can detect is 138 copies/mL. A negative result does not preclude SARS-Cov-2 infection and should not be used as the sole basis for treatment or other patient management decisions. A negative result may occur with  improper specimen collection/handling, submission of specimen other than  nasopharyngeal swab, presence of viral mutation(s) within the areas targeted by this assay, and inadequate number of viral copies(<138 copies/mL). A negative result must be combined with clinical observations, patient history, and epidemiological information. The expected result is Negative.  Fact Sheet for Patients:  bloggercourse.com  Fact Sheet for Healthcare Providers:  seriousbroker.it  This test is no t yet approved or cleared by the [place]  FDA and  has been authorized for detection and/or diagnosis of SARS-CoV-2 by FDA under an Emergency Use Authorization (EUA). This EUA will remain  in effect (meaning this test can be used) for the duration of the COVID-19 declaration under Section 564(b)(1) of the Act, 21 U.S.C.section 360bbb-3(b)(1), unless the authorization is terminated  or revoked sooner.       Influenza A by PCR NEGATIVE NEGATIVE Final   Influenza B by PCR NEGATIVE NEGATIVE Final    Comment: (NOTE) The Xpert Xpress SARS-CoV-2/FLU/RSV plus assay is intended as an aid in the diagnosis of influenza from Nasopharyngeal swab specimens and should not be used as a sole basis for treatment. Nasal washings and aspirates are unacceptable for Xpert Xpress SARS-CoV-2/FLU/RSV testing.  Fact Sheet for  Patients: bloggercourse.com  Fact Sheet for Healthcare Providers: seriousbroker.it  This test is not yet approved or cleared by the [place]  FDA and has been authorized for detection and/or diagnosis of SARS-CoV-2 by FDA under an Emergency Use Authorization (EUA). This EUA will remain in effect (meaning this test can be used) for the duration of the COVID-19 declaration under Section 564(b)(1) of the Act, 21 U.S.C. section 360bbb-3(b)(1), unless the authorization is terminated or revoked.     Resp Syncytial Virus by PCR NEGATIVE NEGATIVE Final    Comment: (NOTE) Fact Sheet for Patients: bloggercourse.com  Fact Sheet for Healthcare Providers: seriousbroker.it  This test is not yet approved or cleared by the [place]  FDA and has been authorized for detection and/or diagnosis of SARS-CoV-2 by FDA under an Emergency Use Authorization (EUA). This EUA will remain in effect (meaning this test can be used) for the duration of the COVID-19 declaration under Section 564(b)(1) of the Act, 21 U.S.C. section 360bbb-3(b)(1), unless the authorization is terminated or revoked.  Performed at [facility], [address], [place], [place] [address]      Radiology Studies: No results found.   Scheduled Meds:  enoxaparin  (LOVENOX ) injection  40 mg Subcutaneous Q24H   finasteride   5 mg Oral Daily   fludrocortisone   100 mcg Oral Daily   levothyroxine   50 mcg Oral Daily   sodium chloride  flush  3 mL Intravenous Q12H   tamsulosin   0.4 mg Oral Daily   Continuous Infusions:  piperacillin-tazobactam (ZOSYN)  IV 3.375 g (09/07/24 0848)     LOS: 2 days   Ivonne Mustache, MD [facility] P11/01/2024, 2:18 PM

## 2024-09-07 NOTE — Evaluation (Signed)
 Physical Therapy Evaluation Patient Details Name: Joseph Rocha MRN: [id number] DOB: 08-04-1935 Today's Date: 09/07/2024  History of Present Illness  89 yo male  presents 09/04/24 with generalized body ache, N/V, hyponatremia/ SIADH, uncomplicated sigmoid colon diverticulitis . PMH: diabetes type 2, hypertension, CKD stage III, paroxysmal A-fib not on anticoagulation, BPH  Clinical Impression  The patient ambulated x 440'  using RW, has H/O right foot drop and compensates well.  Patient reports uses [employer] when out of home, still drives, loves to go on cruises. Patient does not require  PT after DC.       If plan is discharge home, recommend the following: Help with stairs or ramp for entrance;Assist for transportation   Can travel by private vehicle        Equipment Recommendations None recommended by PT  Recommendations for Other Services       Functional Status Assessment Patient has had a recent decline in their functional status and demonstrates the ability to make significant improvements in function in a reasonable and predictable amount of time.     Precautions / Restrictions Precautions Precautions: Fall      Mobility  Bed Mobility Overal bed mobility: Independent                  Transfers Overall transfer level: Needs assistance Equipment used: Rolling walker (2 wheels) Transfers: Sit to/from Stand Sit to Stand: Contact guard assist                Ambulation/Gait Ambulation/Gait assistance: Contact guard assist Gait Distance (Feet): 440 Feet Assistive device: Rolling walker (2 wheels) Gait Pattern/deviations: Step-to pattern, Step-through pattern, Steppage Gait velocity: decr     General Gait Details: steppage on R  Stairs            Wheelchair Mobility     Tilt Bed    Modified Rankin (Stroke Patients Only)       Balance Overall balance assessment: Needs assistance Sitting-balance support: Feet supported, No upper extremity  supported Sitting balance-Leahy Scale: Normal     Standing balance support: Reliant on assistive device for balance, During functional activity, Bilateral upper extremity supported Standing balance-Leahy Scale: Fair                               Pertinent Vitals/Pain Pain Assessment Pain Assessment: No/denies pain    Home Living Family/patient expects to be discharged to:: Private residence Living Arrangements: Spouse/significant other;Children Available Help at Discharge: Family Type of Home: House Home Access: Stairs to enter       Home Layout: Two level;Able to live on main level with bedroom/bathroom Home Equipment: Agricultural Consultant (2 wheels)      Prior Function Prior Level of Function : Independent/Modified Independent;Driving             Mobility Comments: reports uses RW out of home       Extremity/Trunk Assessment   Upper Extremity Assessment Upper Extremity Assessment: Generalized weakness    Lower Extremity Assessment Lower Extremity Assessment: RLE deficits/detail;Generalized weakness RLE Deficits / Details: foot drop    Cervical / Trunk Assessment Cervical / Trunk Assessment: Kyphotic  Communication   Communication Communication: No apparent difficulties    Cognition Arousal: Alert Behavior During Therapy: WFL for tasks assessed/performed   PT - Cognitive impairments: No apparent impairments  Following commands: Intact       Cueing       General Comments      Exercises     Assessment/Plan    PT Assessment Patient needs continued PT services  PT Problem List         PT Treatment Interventions DME instruction;Gait training;Functional mobility training    PT Goals (Current goals can be found in the Care Plan section)  Acute Rehab PT Goals Patient Stated Goal: go home PT Goal Formulation: With patient Time For Goal Achievement: 09/21/24 Potential to Achieve Goals: Good     Frequency Min 2X/week     Co-evaluation               AM-PAC PT 6 Clicks Mobility  Outcome Measure Help needed turning from your back to your side while in a flat bed without using bedrails?: None Help needed moving from lying on your back to sitting on the side of a flat bed without using bedrails?: None Help needed moving to and from a bed to a chair (including a wheelchair)?: A Little Help needed standing up from a chair using your arms (e.g., wheelchair or bedside chair)?: A Little Help needed to walk in hospital room?: A Little Help needed climbing 3-5 steps with a railing? : A Little 6 Click Score: 20    End of Session Equipment Utilized During Treatment: Gait belt Activity Tolerance: Patient tolerated treatment well Patient left: in chair;with call bell/phone within reach Nurse Communication: Mobility status      Time: 8948-8884 PT Time Calculation (min) (ACUTE ONLY): 24 min   Charges:   PT Evaluation $PT Eval Low Complexity: 1 Low PT Treatments $Gait Training: 8-22 mins PT General Charges $$ ACUTE PT VISIT: 1 Visit       Darice Potters PT Acute Rehabilitation Services Office [phone]  Potters Darice Norris 09/07/2024, 12:58 PM

## 2024-09-07 NOTE — Plan of Care (Signed)
  Problem: Education: Goal: Ability to describe self-care measures that may prevent or decrease complications (Diabetes Survival Skills Education) will improve Outcome: Progressing   Problem: Education: Goal: Ability to describe self-care measures that may prevent or decrease complications (Diabetes Survival Skills Education) will improve Outcome: Progressing

## 2024-09-08 ENCOUNTER — Other Ambulatory Visit (HOSPITAL_COMMUNITY): Payer: Self-pay

## 2024-09-08 LAB — BASIC METABOLIC PANEL WITH GFR
Anion gap: 9 (ref 5–15)
BUN: 13 mg/dL (ref 8–23)
CO2: 23 mmol/L (ref 22–32)
Calcium: 8.6 mg/dL — ABNORMAL LOW (ref 8.9–10.3)
Chloride: 99 mmol/L (ref 98–111)
Creatinine, Ser: 1.14 mg/dL (ref 0.61–1.24)
GFR, Estimated: >60 mL/min (ref >60)
Glucose, Bld: 126 mg/dL — ABNORMAL HIGH (ref 70–99)
Potassium: 3.3 mmol/L — ABNORMAL LOW (ref 3.5–5.1)
Sodium: 131 mmol/L — ABNORMAL LOW (ref 135–145)

## 2024-09-08 LAB — SODIUM
Sodium: 132 mmol/L — ABNORMAL LOW (ref 135–145)
Sodium: 133 mmol/L — ABNORMAL LOW (ref 135–145)

## 2024-09-08 LAB — GLUCOSE, CAPILLARY
Glucose-Capillary: 86 mg/dL (ref 70–99)
Glucose-Capillary: 93 mg/dL (ref 70–99)

## 2024-09-08 MED ORDER — AMOXICILLIN-POT CLAVULANATE 500-125 MG PO TABS
1.0000 | ORAL_TABLET | Freq: Two times a day (BID) | ORAL | 0 refills | Status: AC
  Filled 2024-09-08: qty 12, 6d supply, fill #0

## 2024-09-08 MED ORDER — AMOXICILLIN-POT CLAVULANATE 875-125 MG PO TABS: 1.0000 | ORAL_TABLET | Freq: Two times a day (BID) | ORAL | Status: DC

## 2024-09-08 MED ORDER — POTASSIUM CHLORIDE CRYS ER 20 MEQ PO TBCR
20.0000 meq | EXTENDED_RELEASE_TABLET | Freq: Once | ORAL | Status: AC
  Administered 2024-09-08: 20 meq via ORAL
  Filled 2024-09-08: qty 1

## 2024-09-08 NOTE — Plan of Care (Signed)
 Pt received, educated on, and understands discharge summary packet.  All IV accesses dc'd.  Pt to go home with foley cath.  Pt educated on foley catheter care.  Pt wishes to wait until wife to come to hospital to DC and to go over DC instructions again with wife.

## 2024-09-08 NOTE — Plan of Care (Signed)
  Problem: Education: Goal: Ability to describe self-care measures that may prevent or decrease complications (Diabetes Survival Skills Education) will improve Outcome: Progressing   Problem: Coping: Goal: Ability to adjust to condition or change in health will improve Outcome: Progressing   Problem: Metabolic: Goal: Ability to maintain appropriate glucose levels will improve Outcome: Progressing   Problem: Nutritional: Goal: Maintenance of adequate nutrition will improve Outcome: Progressing   Problem: Clinical Measurements: Goal: Ability to maintain clinical measurements within normal limits will improve Outcome: Progressing   Problem: Nutrition: Goal: Adequate nutrition will be maintained Outcome: Progressing   Problem: Safety: Goal: Ability to remain free from injury will improve Outcome: Progressing
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
## 2024-09-08 NOTE — Progress Notes (Signed)
 Discharge medications delivered to patient at the bedside in a secure bag.

## 2024-09-09 ENCOUNTER — Telehealth: Payer: Self-pay | Admitting: *Deleted

## 2024-09-09 NOTE — Transitions of Care (Post Inpatient/ED Visit) (Signed)
   09/09/2024  Name: NAYEF COLLEGE MRN: [id number] DOB: 01-19-1935  Today's TOC FU Call Status: Today's TOC FU Call Status:: Unsuccessful Call (1st Attempt) Unsuccessful Call (1st Attempt) Date: 09/09/24  Attempted to reach the patient regarding the most recent Inpatient/ED visit.  Follow Up Plan: Additional outreach attempts will be made to reach the patient to complete the Transitions of Care (Post Inpatient/ED visit) call.   Mliss Creed [facility], BSN RN Care Manager/ Transition of Care [place]/ [facility] [phone]

## 2024-09-10 ENCOUNTER — Telehealth: Payer: Self-pay | Admitting: *Deleted

## 2024-09-10 ENCOUNTER — Ambulatory Visit (INDEPENDENT_AMBULATORY_CARE_PROVIDER_SITE_OTHER)

## 2024-09-10 DIAGNOSIS — M21371 Foot drop, right foot: Secondary | ICD-10-CM

## 2024-09-10 DIAGNOSIS — M25571 Pain in right ankle and joints of right foot: Secondary | ICD-10-CM | POA: Diagnosis not present

## 2024-09-10 NOTE — Transitions of Care (Post Inpatient/ED Visit) (Signed)
   09/10/2024  Name: Joseph Rocha MRN: [id number] DOB: 1934-11-17  Today's TOC FU Call Status: Today's TOC FU Call Status:: Unsuccessful Call (2nd Attempt) Unsuccessful Call (2nd Attempt) Date: 09/10/24  Attempted to reach the patient regarding the most recent Inpatient/ED visit.  Follow Up Plan: Additional outreach attempts will be made to reach the patient to complete the Transitions of Care (Post Inpatient/ED visit) call.   Mliss Creed [facility], BSN RN Care Manager/ Transition of Care [place]/ [facility] [phone]

## 2024-09-10 NOTE — Progress Notes (Signed)
 Patient presents today to pick up custom molded AFO, diagnosed with Right Foot Drop   Orthosis was dispensed and fit was good patient was very happy with function. Brace adds support and stability to ankle and will also help increase propulsion and toe off to help reduce chance of injury by fall Patient reviewed with me instructions for break-in and wear. Written instructions given to patient.  Patient will follow up as needed.   Lolita Schultze Cped, CFo, CFm

## 2024-09-11 ENCOUNTER — Telehealth: Payer: Self-pay

## 2024-09-11 ENCOUNTER — Telehealth: Payer: Self-pay | Admitting: *Deleted

## 2024-09-11 MED ORDER — GABAPENTIN 100 MG PO CAPS: 100.0000 mg | ORAL_CAPSULE | Freq: Three times a day (TID) | ORAL | 3 refills | Status: AC

## 2024-09-11 MED ORDER — TRAMADOL HCL 50 MG PO TABS: 50.0000 mg | ORAL_TABLET | Freq: Two times a day (BID) | ORAL | 1 refills | Status: AC | PRN

## 2024-09-11 NOTE — Telephone Encounter (Signed)
 Copied from CRM #[id number]. Topic: Clinical - Medication Question >> Sep 11, 2024  1:12 PM Burnard DEL wrote: Reason for CRM: Patient called in stating that he was prescribed pain medicine by PCP,then taken off of it. He would like to know what pain medicine can he take for his back pain.He stated that it is really hurting him. Can provider prescribe something for him?

## 2024-09-11 NOTE — Addendum Note (Signed)
 Addended by: NORLEEN LYNWOOD ORN on: 09/11/2024 04:47 PM   Modules accepted: Orders

## 2024-09-11 NOTE — Telephone Encounter (Signed)
 Ok to let pt know -   Tramadol  for pain has been stopped we should not try to add a stronger narcotic due to recent bowel illness.    I can try a prescription for gabapentin  100 mg  tid as this will not cause any further issues.  I will send this, and you should hear from the office as well  Rx sent to [facility] [address]

## 2024-09-11 NOTE — Transitions of Care (Post Inpatient/ED Visit) (Signed)
 09/11/2024  Name: Joseph Rocha MRN: [id number] DOB: 08-29-1935  Today's TOC FU Call Status: Today's TOC FU Call Status:: Successful TOC FU Call Completed TOC FU Call Complete Date: 09/11/24 Patient's Name and Date of Birth confirmed.  Transition Care Management Follow-up Telephone Call Date of Discharge: 09/08/24 Discharge Facility: [facility]) Type of Discharge: Inpatient Admission Primary Inpatient Discharge Diagnosis:: Acute uncomplicated diverticulitis without need for surgery How have you been since you were released from the hospital?: Better (I am okay but my normal back pain has flared up and I am in a lot of pain from that.  I don't know what I have here to take for it, I will get my medication list, so please call me back in about 10 or 15 minutes) Any questions or concerns?: Yes Patient Questions/Concerns:: Ongoing chronic back pain that has flared up since I was released from the hospital Patient Questions/Concerns Addressed: Other: (Noted several possible medications on patient's medication list for chronic pain: offered medication review: he requested call back in 10 minutes, and when I called back- said to call later in afternoon: Attempted third outreach to patient: no answer)  Initial contact with patient at 12:25 pm: he reported flare of chronic back pain and agreed to medication review, but requested call back in 10 or 15 minutes while he retrieved his medication list/ medications from home  12:47 pm: re-attempted call to patient per his earlier request: he answered phone and said he is now in the bathroom; requested call-back later on this afternoon  3:20 pm: re-attempted call to patient per his earlier request: Phone rang multiple times without physical or voice mail pick up- unable to leave message  Items Reviewed: Did you receive and understand the discharge instructions provided?: Yes (Not reviewed with patient: he requested call back in 10 minutes  to review, and then requested call-back later in afternoon which was unsuccessful) Medications obtained,verified, and reconciled?: No Medications Not Reviewed Reasons:: Other: (Multiple attempts to contact patient per his request to review medications- with each attempt: he requested another call-back and final attempt did not answer phone) Any new allergies since your discharge?: No Dietary orders reviewed?: No Do you have support at home?: Yes People in Home [RPT]: spouse  Medications Reviewed Today: Medications Reviewed Today     Reviewed by Calinda Stockinger M, RN (Registered Nurse) on 09/11/24 at 1532  Med List Status: <None>   Medication Order Taking? Sig Documenting Provider Last Dose Status Informant  amoxicillin -clavulanate (AUGMENTIN ) 500-125 MG tablet [id number] Yes Take 1 tablet by mouth 2 (two) times daily for 6 days. Jillian Buttery, MD  Active   aspirin  EC 81 MG tablet [id number]  Take 1 tablet (81 mg total) by mouth daily. Swallow whole. Norleen Lynwood ORN, MD  Active Self, Pharmacy Records           Med Note CARLEEN, [facility] D   Fri Sep 04, 2024  7:44 AM)    calcium  carbonate (OS-CAL) 600 MG TABS [id number]  Take 600 mg by mouth daily. [provider]  Active Self, Pharmacy Records  cetirizine  (ZYRTEC ) 10 MG tablet [id number]  Take 1 tablet (10 mg total) by mouth daily. Norleen Lynwood ORN, MD  Active Self, Pharmacy Records  cyclobenzaprine  (FLEXERIL ) 5 MG tablet [id number]  Take 1 tablet (5 mg total) by mouth 3 (three) times daily as needed.  Patient taking differently: Take 5 mg by mouth 3 (three) times daily as needed for muscle spasms.   Williams, Megan E,  NP  Active Self, Pharmacy Records  ferrous sulfate  325 (65 FE) MG EC tablet [id number]  Take 325 mg by mouth daily. [provider]  Active Self, Pharmacy Records  finasteride  (PROSCAR ) 5 MG tablet [id number]  TAKE 1 TABLET DAILY  Patient taking differently: Take 5 mg by mouth daily.   Norleen Lynwood ORN, MD  Active Self,  Pharmacy Records  fludrocortisone  (FLORINEF ) 0.1 MG tablet [id number]  Take 1 tablet (100 mcg total) by mouth daily. Norleen Lynwood ORN, MD  Active Self, Pharmacy Records  fluticasone  (FLONASE ) 50 MCG/ACT nasal spray [id number]  Place 2 sprays into both nostrils daily. Rollene Almarie LABOR, MD  Active Self, Pharmacy Records  folic acid  (FOLVITE ) 1 MG tablet [id number]  TAKE 1 TABLET DAILY Norleen Lynwood ORN, MD  Active Self, Pharmacy Records  KLOR-CON  M20 20 MEQ tablet [id number]  Take 20 mEq by mouth daily.  [provider]  Active Self, Pharmacy Records  levothyroxine  (SYNTHROID ) 50 MCG tablet [id number]  Take 1 tablet (50 mcg total) by mouth daily. Norleen Lynwood ORN, MD  Active Self, Pharmacy Records  loperamide  (IMODIUM  A-D) 2 MG tablet [id number]  Take 1 tablet (2 mg total) by mouth 2 (two) times daily as needed for diarrhea or loose stools. Jerrol Lynwood, MD  Active Self, Pharmacy Records           Med Note [facility] [place], [place] A   Wed Nov 06, 2023  9:34 PM)    Magnesium  200 MG TABS [id number]  2 tab by mouth once daily  Patient taking differently: Take 1 tablet by mouth daily. 2 tab by mouth once daily   Norleen Lynwood ORN, MD  Active Self, Pharmacy Records  meloxicam  (MOBIC ) 7.5 MG tablet [id number]  Take 1 tablet (7.5 mg total) by mouth daily as needed for pain. Norleen Lynwood ORN, MD  Active Self, Pharmacy Records           Med Note CARLEEN, [facility] D   Fri Sep 04, 2024  8:03 AM) Has at home - hasn't used in a while.  metFORMIN  (GLUCOPHAGE ) 500 MG tablet [id number]  Take 1 tablet (500 mg total) by mouth 2 (two) times daily with a meal. Norleen Lynwood ORN, MD  Active Self, Pharmacy Records  methocarbamol  (ROBAXIN ) 500 MG tablet [id number]  1-2 tab by mouth at bedtime as needed Norleen Lynwood ORN, MD  Active Self, Pharmacy Records  Omega-3 Fatty Acids (FISH OIL ) 1000 MG CAPS [id number]  Take 1 capsule by mouth daily. [provider]  Active Self, Pharmacy Records  omeprazole  (PRILOSEC) 40 MG capsule  [id number]  Take 1 capsule (40 mg total) by mouth daily. Norleen Lynwood ORN, MD  Active Self, Pharmacy Records  ondansetron  (ZOFRAN -ODT) 4 MG disintegrating tablet [id number]  Take 1 tablet (4 mg total) by mouth every 8 (eight) hours as needed for nausea or vomiting. Beather Delon Gibson, [place]  Active Self, Pharmacy Records  repaglinide  (PRANDIN ) 2 MG tablet [id number]  Take 1 tablet (2 mg total) by mouth 2 (two) times daily before a meal. TAKE 1 TABLET TWICE DAILY  BEFORE MEALS Norleen Lynwood ORN, MD  Active Self, Pharmacy Records  rosuvastatin  (CRESTOR ) 5 MG tablet [id number]  TAKE 1 TABLET DAILY  Patient taking differently: Take 5 mg by mouth daily.   Norleen Lynwood ORN, MD  Active Self, Pharmacy Records  Tamsulosin  HCl (FLOMAX ) 0.4 MG CAPS [id number]  Take 0.4 mg by mouth daily. [provider]  Active Self, Pharmacy Records  Testosterone  30 MG/ACT LARRAINE [id number]  Place 2 Pump onto the skin daily. [provider]  Active Self, Pharmacy Records           Home Care and Equipment/Supplies: Were Home Health Services Ordered?: No Any new equipment or medical supplies ordered?: No  Functional Questionnaire: Do you need assistance with bathing/showering or dressing?:  (unable to fully assess:  multiple attempts to complete full TOC call today were all deferred by patient with final outreach being unsuccessful) Do you need assistance with meal preparation?:  (unable to fully assess: multiple attempts to complete full TOC call today were all deferred by patient with final outreach being unsuccessful) Do you need assistance with eating?:  (unable to fully assess: multiple attempts to complete full TOC call today were all deferred by patient with final outreach being unsuccessful) Do you have difficulty maintaining continence:  (unable to fully assess: multiple attempts to complete full TOC call today were all deferred by patient with final outreach being unsuccessful) Do you need assistance with  getting out of bed/getting out of a chair/moving?:  (unable to fully assess: multiple attempts to complete full TOC call today were all deferred by patient with final outreach being unsuccessful) Do you have difficulty managing or taking your medications?:  (unable to fully assess: multiple attempts to complete full TOC call today were all deferred by patient with final outreach being unsuccessful)  Follow up appointments reviewed: PCP Follow-up appointment confirmed?: Yes Date of PCP follow-up appointment?: 09/18/24 Follow-up Provider: PCP Specialist Hospital Follow-up appointment confirmed?: Yes Date of Specialist follow-up appointment?: 10/15/24 (verified this is recommended time frame for follow up per hospital discharging provider notes) Follow-Up Specialty Provider:: GI provider Do you need transportation to your follow-up appointment?:  (unable to fully assess: multiple attempts to complete full TOC call today were all deferred by patient with final outreach being unsuccessful) Do you understand care options if your condition(s) worsen?: Yes-patient verbalized understanding  SDOH Interventions Today    Flowsheet Row Most Recent Value  SDOH Interventions   Food Insecurity Interventions Patient Declined  [unable to fully assess: multiple attempts to complete full TOC call today were all deferred by patient with final outreach being unsuccessful]  Housing Interventions Patient Declined  [unable to fully assess: multiple attempts to complete full TOC call today were all deferred by patient with final outreach being unsuccessful]  Transportation Interventions Patient Declined  [unable to fully assess: multiple attempts to complete full TOC call today were all deferred by patient with final outreach being unsuccessful]  Utilities Interventions Patient Declined  [unable to fully assess: multiple attempts to complete full TOC call today were all deferred by patient with final outreach being  unsuccessful]   See TOC assessment tabs for additional assessment/ TOC intervention information  Unable to fully complete TOC assessment: multiple attempts to complete full TOC call today per patient request for call-backs at later time were all deferred by patient with final outreach being unsuccessful- unable to offer TOC 30-day program enrollment because patient did not answer for final outreach attempt  Pls call/ message for questions,  Terris Bodin Mckinney Daman Steffenhagen, RN, BSN, Media Planner  Transitions of Care  [facility] - [facility] [phone]: direct office

## 2024-09-12 ENCOUNTER — Other Ambulatory Visit: Payer: Self-pay | Admitting: Internal Medicine

## 2024-09-14 ENCOUNTER — Other Ambulatory Visit: Payer: Self-pay

## 2024-09-15 NOTE — Telephone Encounter (Signed)
 Called and let Pt know

## 2024-09-15 NOTE — Progress Notes (Signed)
 Patient was seen and examined at the bedside.  Hemodynamically stable.  Foley was placed.  No new change in the medical management.  Hemodynamically stable for discharge.  Discharge orders and summary already placed

## 2024-09-16 DIAGNOSIS — I10 Essential (primary) hypertension: Secondary | ICD-10-CM | POA: Diagnosis not present

## 2024-09-16 DIAGNOSIS — E871 Hypo-osmolality and hyponatremia: Secondary | ICD-10-CM | POA: Diagnosis not present

## 2024-09-16 DIAGNOSIS — N400 Enlarged prostate without lower urinary tract symptoms: Secondary | ICD-10-CM | POA: Diagnosis not present

## 2024-09-16 DIAGNOSIS — K5792 Diverticulitis of intestine, part unspecified, without perforation or abscess without bleeding: Secondary | ICD-10-CM | POA: Diagnosis not present

## 2024-09-16 DIAGNOSIS — E230 Hypopituitarism: Secondary | ICD-10-CM | POA: Diagnosis not present

## 2024-09-16 DIAGNOSIS — E785 Hyperlipidemia, unspecified: Secondary | ICD-10-CM | POA: Diagnosis not present

## 2024-09-18 ENCOUNTER — Telehealth: Payer: Self-pay

## 2024-09-18 ENCOUNTER — Encounter: Payer: Self-pay | Admitting: Internal Medicine

## 2024-09-18 ENCOUNTER — Ambulatory Visit: Admitting: Internal Medicine

## 2024-09-18 ENCOUNTER — Ambulatory Visit: Payer: Self-pay | Admitting: Internal Medicine

## 2024-09-18 VITALS — BP 122/70 | HR 70 | Temp 98.0°F | Ht 75.0 in | Wt 189.0 lb

## 2024-09-18 DIAGNOSIS — E1122 Type 2 diabetes mellitus with diabetic chronic kidney disease: Secondary | ICD-10-CM | POA: Diagnosis not present

## 2024-09-18 DIAGNOSIS — N1831 Chronic kidney disease, stage 3a: Secondary | ICD-10-CM

## 2024-09-18 DIAGNOSIS — Z8719 Personal history of other diseases of the digestive system: Secondary | ICD-10-CM | POA: Diagnosis not present

## 2024-09-18 DIAGNOSIS — E871 Hypo-osmolality and hyponatremia: Secondary | ICD-10-CM

## 2024-09-18 DIAGNOSIS — K5792 Diverticulitis of intestine, part unspecified, without perforation or abscess without bleeding: Secondary | ICD-10-CM

## 2024-09-18 DIAGNOSIS — R3911 Hesitancy of micturition: Secondary | ICD-10-CM | POA: Diagnosis not present

## 2024-09-18 DIAGNOSIS — N401 Enlarged prostate with lower urinary tract symptoms: Secondary | ICD-10-CM | POA: Diagnosis not present

## 2024-09-18 LAB — BASIC METABOLIC PANEL WITH GFR
BUN: 20 mg/dL (ref 6–23)
CO2: 28 meq/L (ref 19–32)
Calcium: 8.9 mg/dL (ref 8.4–10.5)
Chloride: 97 meq/L (ref 96–112)
Creatinine, Ser: 1.29 mg/dL (ref 0.40–1.50)
GFR: 49.81 mL/min — ABNORMAL LOW (ref >60.00)
Glucose, Bld: 87 mg/dL (ref 70–99)
Potassium: 3.9 meq/L (ref 3.5–5.1)
Sodium: 130 meq/L — ABNORMAL LOW (ref 135–145)

## 2024-09-18 LAB — CBC WITH DIFFERENTIAL/PLATELET
Basophils Absolute: 0.1 K/uL (ref 0.0–0.1)
Basophils Relative: 0.9 % (ref 0.0–3.0)
Eosinophils Absolute: 0.3 K/uL (ref 0.0–0.7)
Eosinophils Relative: 4 % (ref 0.0–5.0)
HCT: 34.7 % — ABNORMAL LOW (ref 39.0–52.0)
Hemoglobin: 11.9 g/dL — ABNORMAL LOW (ref 13.0–17.0)
Lymphocytes Relative: 31.8 % (ref 12.0–46.0)
Lymphs Abs: 2.3 K/uL (ref 0.7–4.0)
MCHC: 34.4 g/dL (ref 30.0–36.0)
MCV: 90.6 fl (ref 78.0–100.0)
Monocytes Absolute: 0.8 K/uL (ref 0.1–1.0)
Monocytes Relative: 10.4 % (ref 3.0–12.0)
Neutro Abs: 3.9 K/uL (ref 1.4–7.7)
Neutrophils Relative %: 52.9 % (ref 43.0–77.0)
Platelets: 288 K/uL (ref 150.0–400.0)
RBC: 3.83 Mil/uL — ABNORMAL LOW (ref 4.22–5.81)
RDW: 13.2 % (ref 11.5–15.5)
WBC: 7.3 K/uL (ref 4.0–10.5)

## 2024-09-18 LAB — HEPATIC FUNCTION PANEL
ALT: 12 U/L (ref 0–53)
AST: 21 U/L (ref 0–37)
Albumin: 3.8 g/dL (ref 3.5–5.2)
Alkaline Phosphatase: 50 U/L (ref 39–117)
Bilirubin, Direct: 0.1 mg/dL (ref 0.0–0.3)
Total Bilirubin: 0.5 mg/dL (ref 0.2–1.2)
Total Protein: 6.8 g/dL (ref 6.0–8.3)
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
## 2024-09-18 NOTE — Patient Instructions (Signed)

## 2024-09-18 NOTE — Telephone Encounter (Signed)
 Copied from CRM #[id number]. Topic: General - Other >> Sep 18, 2024  4:26 PM Rosina BIRCH wrote: Reason for CRM: patient called stating he went to the kidney doctor on Wednesday and they did a blood test. The patient was called today and was told not to drink no more than 64oz of liquid a day. When he asked the nurse why, she did not know. Patient stated it did not seem right [phone]

## 2024-09-19 ENCOUNTER — Encounter: Payer: Self-pay | Admitting: Internal Medicine

## 2024-09-19 NOTE — Assessment & Plan Note (Signed)
 Pt had lab last wk but requests repeat sodium today - ok for bmp

## 2024-09-19 NOTE — Assessment & Plan Note (Signed)
 Clinically resolved,  to f/u any worsening symptoms or concerns

## 2024-09-19 NOTE — Assessment & Plan Note (Signed)
 With transient urinary retention requiring foley temporarily, now improved, to f/u urology dec 14

## 2024-09-21 NOTE — Telephone Encounter (Signed)
 Pt was seen in office on 09/18/24.

## 2024-10-13 ENCOUNTER — Other Ambulatory Visit (HOSPITAL_COMMUNITY): Payer: Self-pay
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
## 2024-10-15 ENCOUNTER — Encounter: Payer: Self-pay | Admitting: Gastroenterology

## 2024-10-15 ENCOUNTER — Ambulatory Visit: Admitting: Gastroenterology

## 2024-10-15 VITALS — BP 90/56 | HR 59 | Ht 75.0 in | Wt 196.4 lb

## 2024-10-15 DIAGNOSIS — N183 Chronic kidney disease, stage 3 unspecified: Secondary | ICD-10-CM | POA: Diagnosis not present

## 2024-10-15 DIAGNOSIS — Z8719 Personal history of other diseases of the digestive system: Secondary | ICD-10-CM | POA: Diagnosis not present

## 2024-10-15 DIAGNOSIS — I480 Paroxysmal atrial fibrillation: Secondary | ICD-10-CM

## 2024-10-15 DIAGNOSIS — E1122 Type 2 diabetes mellitus with diabetic chronic kidney disease: Secondary | ICD-10-CM | POA: Diagnosis not present

## 2024-10-15 DIAGNOSIS — Z7984 Long term (current) use of oral hypoglycemic drugs: Secondary | ICD-10-CM | POA: Diagnosis not present

## 2024-10-15 DIAGNOSIS — K5730 Diverticulosis of large intestine without perforation or abscess without bleeding: Secondary | ICD-10-CM

## 2024-10-15 DIAGNOSIS — N1831 Chronic kidney disease, stage 3a: Secondary | ICD-10-CM

## 2024-10-15 NOTE — Progress Notes (Signed)
 Noted

## 2024-10-15 NOTE — Patient Instructions (Addendum)
 A high fiber diet with plenty of fluids (up to 8 glasses of water daily) is suggested to relieve these symptoms.  Metamucil, 1 tablespoon once or twice daily can be used to keep bowels regular if needed.   Please follow up with Dr. Abran. We do not have March's schedule available at this time. Please contact our office at the end of this month to schedule an appointment for March.   _______________________________________________________  If your blood pressure at your visit was 140/90 or greater, please contact your primary care physician to follow up on this.  _______________________________________________________  If you are age 48 or older, your body mass index should be between 23-30. Your Body mass index is 24.55 kg/m. If this is out of the aforementioned range listed, please consider follow up with your Primary Care Provider.  If you are age 32 or younger, your body mass index should be between 19-25. Your Body mass index is 24.55 kg/m. If this is out of the aformentioned range listed, please consider follow up with your Primary Care Provider.   ________________________________________________________  The [facility] providers would like to encourage you to use MYCHART to communicate with providers for non-urgent requests or questions.  Due to long hold times on the telephone, sending your provider a message by [facility] may be a faster and more efficient way to get a response.  Please allow 48 business hours for a response.  Please remember that this is for non-urgent requests.  _______________________________________________________  [facility] is using a team-based approach to care.  Your team is made up of your doctor and two to three APPS. Our APPS (Nurse Practitioners and Physician Assistants) work with your physician to ensure care continuity for you. They are fully qualified to address your health concerns and develop a treatment plan. They communicate directly with  your gastroenterologist to care for you. Seeing the Advanced Practice Practitioners on your physician's team can help you by facilitating care more promptly, often allowing for earlier appointments, access to diagnostic testing, procedures, and other specialty referrals.

## 2024-10-17 ENCOUNTER — Other Ambulatory Visit: Payer: Self-pay | Admitting: Internal Medicine

## 2024-10-19 ENCOUNTER — Other Ambulatory Visit: Payer: Self-pay

## 2024-10-22 ENCOUNTER — Ambulatory Visit: Admitting: Internal Medicine

## 2024-11-20 ENCOUNTER — Other Ambulatory Visit: Payer: Self-pay

## 2024-11-20 ENCOUNTER — Other Ambulatory Visit: Payer: Self-pay | Admitting: Internal Medicine

## 2024-11-20 DIAGNOSIS — E1165 Type 2 diabetes mellitus with hyperglycemia: Secondary | ICD-10-CM

## 2025-01-13 ENCOUNTER — Ambulatory Visit: Admitting: Internal Medicine

## 2025-01-28 ENCOUNTER — Ambulatory Visit
